# Patient Record
Sex: Female | Born: 1945 | Race: White | Hispanic: No | Marital: Married | Smoking: Current every day smoker
Health system: Southern US, Community
[De-identification: ages and names within clinical notes are randomized; demographics above are authoritative.]

## PROBLEM LIST (undated history)

## (undated) DIAGNOSIS — E7800 Pure hypercholesterolemia, unspecified: Secondary | ICD-10-CM

## (undated) DIAGNOSIS — I2510 Atherosclerotic heart disease of native coronary artery without angina pectoris: Secondary | ICD-10-CM

## (undated) DIAGNOSIS — N200 Calculus of kidney: Secondary | ICD-10-CM

## (undated) DIAGNOSIS — I10 Essential (primary) hypertension: Secondary | ICD-10-CM

## (undated) DIAGNOSIS — E039 Hypothyroidism, unspecified: Secondary | ICD-10-CM

## (undated) DIAGNOSIS — K219 Gastro-esophageal reflux disease without esophagitis: Secondary | ICD-10-CM

## (undated) DIAGNOSIS — K5790 Diverticulosis of intestine, part unspecified, without perforation or abscess without bleeding: Secondary | ICD-10-CM

## (undated) DIAGNOSIS — E119 Type 2 diabetes mellitus without complications: Secondary | ICD-10-CM

## (undated) DIAGNOSIS — K469 Unspecified abdominal hernia without obstruction or gangrene: Secondary | ICD-10-CM

## (undated) DIAGNOSIS — R319 Hematuria, unspecified: Secondary | ICD-10-CM

## (undated) HISTORY — DX: Calculus of kidney: N20.0

## (undated) HISTORY — DX: Hypothyroidism, unspecified: E03.9

## (undated) HISTORY — PX: COLONOSCOPY: SHX5424

## (undated) HISTORY — DX: Essential (primary) hypertension: I10

## (undated) HISTORY — PX: APPENDECTOMY: SHX54

## (undated) HISTORY — DX: Diverticulosis of intestine, part unspecified, without perforation or abscess without bleeding: K57.90

## (undated) HISTORY — DX: Hematuria, unspecified: R31.9

## (undated) HISTORY — DX: Gastro-esophageal reflux disease without esophagitis: K21.9

## (undated) HISTORY — DX: Atherosclerotic heart disease of native coronary artery without angina pectoris: I25.10

## (undated) HISTORY — DX: Type 2 diabetes mellitus without complications: E11.9

## (undated) HISTORY — PX: UPPER GI ENDOSCOPY: SHX6162

## (undated) HISTORY — DX: Pure hypercholesterolemia, unspecified: E78.00

---

## 1967-02-08 HISTORY — PX: BREAST BIOPSY: SHX20

## 2003-02-08 HISTORY — PX: CHOLECYSTECTOMY: SHX55

## 2005-08-09 ENCOUNTER — Ambulatory Visit: Payer: Self-pay | Admitting: Gastroenterology

## 2006-01-13 ENCOUNTER — Ambulatory Visit: Payer: Self-pay | Admitting: Internal Medicine

## 2007-03-01 ENCOUNTER — Ambulatory Visit: Payer: Self-pay | Admitting: Internal Medicine

## 2008-03-04 ENCOUNTER — Ambulatory Visit: Payer: Self-pay

## 2008-03-21 ENCOUNTER — Ambulatory Visit: Payer: Self-pay | Admitting: Internal Medicine

## 2008-09-19 ENCOUNTER — Ambulatory Visit: Payer: Self-pay | Admitting: Internal Medicine

## 2008-12-12 ENCOUNTER — Ambulatory Visit: Payer: Self-pay | Admitting: Gastroenterology

## 2008-12-15 ENCOUNTER — Ambulatory Visit: Payer: Self-pay | Admitting: Gastroenterology

## 2009-01-07 ENCOUNTER — Ambulatory Visit: Payer: Self-pay | Admitting: Ophthalmology

## 2009-02-04 ENCOUNTER — Ambulatory Visit: Payer: Self-pay | Admitting: Ophthalmology

## 2009-03-05 ENCOUNTER — Ambulatory Visit: Payer: Self-pay

## 2010-03-17 ENCOUNTER — Ambulatory Visit: Payer: Self-pay | Admitting: Family Medicine

## 2010-12-31 ENCOUNTER — Ambulatory Visit: Payer: Self-pay | Admitting: Internal Medicine

## 2011-03-23 ENCOUNTER — Ambulatory Visit: Payer: Self-pay | Admitting: Internal Medicine

## 2011-04-25 LAB — HEPATIC FUNCTION PANEL
ALT: 14 U/L (ref 7–35)
Alkaline Phosphatase: 91 U/L (ref 25–125)
Bilirubin, Total: 6.9 mg/dL

## 2011-04-25 LAB — BASIC METABOLIC PANEL: Potassium: 4.7 mmol/L (ref 3.4–5.3)

## 2011-04-25 LAB — LIPID PANEL: Cholesterol: 266 mg/dL — AB (ref 0–200)

## 2012-03-08 ENCOUNTER — Encounter: Payer: Self-pay | Admitting: *Deleted

## 2012-03-09 ENCOUNTER — Encounter: Payer: Self-pay | Admitting: Internal Medicine

## 2012-03-09 ENCOUNTER — Ambulatory Visit (INDEPENDENT_AMBULATORY_CARE_PROVIDER_SITE_OTHER): Payer: PRIVATE HEALTH INSURANCE | Admitting: Internal Medicine

## 2012-03-09 ENCOUNTER — Other Ambulatory Visit (HOSPITAL_COMMUNITY)
Admission: RE | Admit: 2012-03-09 | Discharge: 2012-03-09 | Disposition: A | Discharge status: Home / Self Care | Payer: PRIVATE HEALTH INSURANCE | Source: Ambulatory Visit | Attending: Internal Medicine | Admitting: Internal Medicine

## 2012-03-09 VITALS — BP 150/88 | HR 67 | Temp 98.4°F | Ht 63.0 in | Wt 158.5 lb

## 2012-03-09 DIAGNOSIS — Z01419 Encounter for gynecological examination (general) (routine) without abnormal findings: Secondary | ICD-10-CM | POA: Insufficient documentation

## 2012-03-09 DIAGNOSIS — K219 Gastro-esophageal reflux disease without esophagitis: Secondary | ICD-10-CM

## 2012-03-09 DIAGNOSIS — R42 Dizziness and giddiness: Secondary | ICD-10-CM

## 2012-03-09 DIAGNOSIS — I10 Essential (primary) hypertension: Secondary | ICD-10-CM

## 2012-03-09 DIAGNOSIS — Z139 Encounter for screening, unspecified: Secondary | ICD-10-CM

## 2012-03-09 DIAGNOSIS — E039 Hypothyroidism, unspecified: Secondary | ICD-10-CM

## 2012-03-09 DIAGNOSIS — Z1151 Encounter for screening for human papillomavirus (HPV): Secondary | ICD-10-CM | POA: Insufficient documentation

## 2012-03-09 DIAGNOSIS — E7800 Pure hypercholesterolemia, unspecified: Secondary | ICD-10-CM

## 2012-03-09 DIAGNOSIS — E119 Type 2 diabetes mellitus without complications: Secondary | ICD-10-CM

## 2012-03-10 ENCOUNTER — Encounter: Payer: Self-pay | Admitting: Internal Medicine

## 2012-03-10 DIAGNOSIS — E7800 Pure hypercholesterolemia, unspecified: Secondary | ICD-10-CM | POA: Insufficient documentation

## 2012-03-10 DIAGNOSIS — I10 Essential (primary) hypertension: Secondary | ICD-10-CM | POA: Insufficient documentation

## 2012-03-10 DIAGNOSIS — K219 Gastro-esophageal reflux disease without esophagitis: Secondary | ICD-10-CM | POA: Insufficient documentation

## 2012-03-10 DIAGNOSIS — E039 Hypothyroidism, unspecified: Secondary | ICD-10-CM | POA: Insufficient documentation

## 2012-03-10 DIAGNOSIS — E119 Type 2 diabetes mellitus without complications: Secondary | ICD-10-CM | POA: Insufficient documentation

## 2012-03-10 MED ORDER — LEVOTHYROXINE SODIUM 100 MCG PO TABS: 100.0000 ug | ORAL_TABLET | Freq: Every day | ORAL | Status: DC

## 2012-03-10 MED ORDER — PANTOPRAZOLE SODIUM 40 MG PO TBEC: 40.0000 mg | DELAYED_RELEASE_TABLET | Freq: Every day | ORAL | Status: DC

## 2012-03-10 MED ORDER — LISINOPRIL 10 MG PO TABS: 10.0000 mg | ORAL_TABLET | Freq: Every day | ORAL | Status: DC

## 2012-03-10 MED ORDER — SITAGLIPTIN PHOS-METFORMIN HCL 50-500 MG PO TABS: 1.0000 | ORAL_TABLET | Freq: Every day | ORAL | Status: DC

## 2012-03-10 NOTE — Assessment & Plan Note (Signed)
On thyroid replacement.  Check tsh.  

## 2012-03-10 NOTE — Progress Notes (Signed)
Subjective:    Patient ID: Ann Collins, female    DOB: 1945-10-05, 67 y.o.   MRN: [id number]  HPI 67 year old female with past history of diabetes, hypertension, hypercholesterolemia and hypothyroidism who comes in today to follow up on these issues as well as for a complete physical exam.  She states that approximately 10 days ago, she was standing and cooking - noticed being light headed.  Has had some persistent dizziness/light headedness.  States if she is looking down and then looks up - will notice room moving or objects moving.  Some nausea associated.  Has been intermittent for the last two weeks.  She has tried doing head maneuvers - which have helped.  Has noticed some sinus pressure.  No headache or vision changes.   No chest pain or tightness.  Breathing stable.  Bowels stable.     Past Medical History  Diagnosis Date  . Hypothyroidism   . Hypercholesterolemia   . GERD (gastroesophageal reflux disease)   . Hypertension   . Diabetes mellitus   . Hematuria   . Diverticulosis   . Kidney stones     Current Outpatient Prescriptions on File Prior to Visit  Medication Sig Dispense Refill  . aspirin EC 81 MG tablet Take 81 mg by mouth daily.      . fluticasone (FLONASE) 50 MCG/ACT nasal spray Place 2 sprays into the nose daily.      Marland Kitchen levothyroxine (SYNTHROID, LEVOTHROID) 100 MCG tablet Take 1 tablet (100 mcg total) by mouth daily.  90 tablet  1  . lisinopril (PRINIVIL,ZESTRIL) 10 MG tablet Take 1 tablet (10 mg total) by mouth daily.  90 tablet  1  . pantoprazole (PROTONIX) 40 MG tablet Take 1 tablet (40 mg total) by mouth daily.  90 tablet  1  . sitaGLIPtan-metformin (JANUMET) 50-500 MG per tablet Take 1 tablet by mouth daily.  90 tablet  1    Review of Systems Patient denies any headache.  Light headedness as outlined. No vision changes.   Some sinus pressure.   No chest pain, tightness or palpitations.  No increased shortness of breath, cough or congestion.  No nausea or  vomiting.  No acid reflux.  No abdominal pain or cramping.  No bowel change, such as diarrhea, constipation, BRBPR or melana.  No urine change.  Not taking her meds regularly.  Skips doses.        Objective:   Physical Exam Filed Vitals:   03/09/12 1347  BP: 150/88  Pulse: 67  Temp: 98.4 F (36.9 C)   Blood pressure recheck:  66/33  67 year old female in no acute distress.   HEENT:  Nares- clear.  Oropharynx - without lesions. NECK:  Supple.  Nontender.  HEART:  Appears to be regular. LUNGS:  No crackles or wheezing audible.  Respirations even and unlabored.  RADIAL PULSE:  Equal bilaterally.    BREASTS:  No nipple discharge or nipple retraction present.  Could not appreciate any distinct nodules or axillary adenopathy.  ABDOMEN:  Soft, nontender.  Bowel sounds present and normal.  No audible abdominal bruit.  GU:  Normal external genitalia.  Vaginal vault without lesions.  Cervix identified.  Pap performed. Could not appreciate any adnexal masses or tenderness.   RECTAL:  Heme negative.   EXTREMITIES:  No increased edema present.  DP pulses palpable and equal bilaterally.   SKIN:  Without lesions.          Assessment & Plan:  LIGHT HEADEDNESS.  See above.  Reproducible - with certain movements.  Head maneuvers - have helped.  Persistent.  Discussed further w/up.  Declines scanning.  Will refer to ENT for evaluation and further testing.  Take aspirin daily.   MEDICATION NONCOMPLIANCE.  Discussed at length with her today.  Discussed the importance of taking her medication regularly.  Follow.   CARDIOVASCULAR.  Stress test negative for ischemia 11/10.  Currently asymptomatic.    LEFT OVARIAN CYST.  Pelvic ultrasound 12/31/10 revealed a 6mm left ovarian cyst.  Discussed with GYN.  Felt no further w/up warranted.    HEALTH MAINTENANCE.  Physical today.  Colonoscopy 12/22/08 - diverticulosis otherwise normal.  Mammogram 03/23/11 - BiRADS II.  Schedule follow up mammogram when due.   IFOB.

## 2012-03-10 NOTE — Assessment & Plan Note (Signed)
Blood pressure elevated today.  Not taking her meds regularly.  Restart - take regularly.  Continue lisinopril.  Follow blood pressure.  Check metabolic panel.

## 2012-03-10 NOTE — Assessment & Plan Note (Signed)
EGD 12/12/08 revealed erosive esophagitis, hiatal hernia, gastritis and duodenitis.  No problems with upper GI symptoms reported today.  Follow.     

## 2012-03-10 NOTE — Assessment & Plan Note (Signed)
Brought in no sugar readings.  A1c 10/13 - 7.7.  Not taking her medication regularly.  Discussed the importance of taking her meds regularly.  Diabetic diet and exercise.  Follow.

## 2012-03-19 ENCOUNTER — Other Ambulatory Visit (INDEPENDENT_AMBULATORY_CARE_PROVIDER_SITE_OTHER): Payer: PRIVATE HEALTH INSURANCE

## 2012-03-19 DIAGNOSIS — Z139 Encounter for screening, unspecified: Secondary | ICD-10-CM

## 2012-03-20 ENCOUNTER — Encounter: Payer: Self-pay | Admitting: *Deleted

## 2012-03-20 LAB — FECAL OCCULT BLOOD, IMMUNOCHEMICAL: Fecal Occult Bld: NEGATIVE

## 2012-03-26 ENCOUNTER — Telehealth: Payer: Self-pay | Admitting: Internal Medicine

## 2012-03-26 MED ORDER — LEVOTHYROXINE SODIUM 100 MCG PO TABS: 100.0000 ug | ORAL_TABLET | Freq: Every day | ORAL | Status: DC

## 2012-03-26 NOTE — Telephone Encounter (Signed)
Refill on Levothyroxine 90 day supplu to [facility]

## 2012-03-26 NOTE — Telephone Encounter (Signed)
Prescription sent to pharmacy.

## 2012-03-27 ENCOUNTER — Ambulatory Visit: Payer: Self-pay | Admitting: Internal Medicine

## 2012-04-02 ENCOUNTER — Encounter: Payer: Self-pay | Admitting: Internal Medicine

## 2012-04-02 ENCOUNTER — Ambulatory Visit (INDEPENDENT_AMBULATORY_CARE_PROVIDER_SITE_OTHER): Payer: PRIVATE HEALTH INSURANCE | Admitting: Internal Medicine

## 2012-04-02 VITALS — BP 120/70 | HR 66 | Temp 98.8°F | Ht 63.0 in | Wt 159.5 lb

## 2012-04-02 DIAGNOSIS — E039 Hypothyroidism, unspecified: Secondary | ICD-10-CM

## 2012-04-02 DIAGNOSIS — E7800 Pure hypercholesterolemia, unspecified: Secondary | ICD-10-CM

## 2012-04-02 DIAGNOSIS — E119 Type 2 diabetes mellitus without complications: Secondary | ICD-10-CM

## 2012-04-02 DIAGNOSIS — K219 Gastro-esophageal reflux disease without esophagitis: Secondary | ICD-10-CM

## 2012-04-02 DIAGNOSIS — I10 Essential (primary) hypertension: Secondary | ICD-10-CM

## 2012-04-02 MED ORDER — FLUTICASONE PROPIONATE 50 MCG/ACT NA SUSP: 2.0000 | Freq: Every day | NASAL | Status: DC

## 2012-04-02 MED ORDER — LEVOTHYROXINE SODIUM 100 MCG PO TABS: 100.0000 ug | ORAL_TABLET | Freq: Every day | ORAL | Status: DC

## 2012-04-02 MED ORDER — LISINOPRIL 10 MG PO TABS: 10.0000 mg | ORAL_TABLET | Freq: Every day | ORAL | Status: DC

## 2012-04-11 ENCOUNTER — Encounter: Payer: Self-pay | Admitting: Internal Medicine

## 2012-04-15 ENCOUNTER — Encounter: Payer: Self-pay | Admitting: Internal Medicine

## 2012-04-15 ENCOUNTER — Telehealth: Payer: Self-pay | Admitting: Internal Medicine

## 2012-04-15 NOTE — Assessment & Plan Note (Signed)
On thyroid replacement.  Follow tsh.  

## 2012-04-15 NOTE — Assessment & Plan Note (Signed)
Blood pressure improved.  Taking her medication regularly now.  Continue lisinopril.  Follow blood pressure.  Check metabolic panel.

## 2012-04-15 NOTE — Assessment & Plan Note (Signed)
EGD 12/12/08 revealed erosive esophagitis, hiatal hernia, gastritis and duodenitis.  No problems with upper GI symptoms reported today.  Follow.

## 2012-04-15 NOTE — Progress Notes (Signed)
  Subjective:    Patient ID: Ann Collins, female    DOB: April 22, 1945, 67 y.o.   MRN: [id number]  HPI 67 year old female with past history of diabetes, hypertension, hypercholesterolemia and hypothyroidism who comes in today for a scheduled follow up.  No headache or vision changes.   No chest pain or tightness.  Breathing stable.  Bowels stable.  She states she is doing better watching what she eats.  No nausea or vomiting.  Brought in no sugar readings.  States they have been doing better.  Taking all of her medicine now.  Some sinus drainage, but no significant infection.     Past Medical History  Diagnosis Date  . Hypothyroidism   . Hypercholesterolemia   . GERD (gastroesophageal reflux disease)   . Hypertension   . Diabetes mellitus   . Hematuria   . Diverticulosis   . Kidney stones     Current Outpatient Prescriptions on File Prior to Visit  Medication Sig Dispense Refill  . aspirin EC 81 MG tablet Take 81 mg by mouth daily.      . pantoprazole (PROTONIX) 40 MG tablet Take 1 tablet (40 mg total) by mouth daily.  90 tablet  1  . sitaGLIPtan-metformin (JANUMET) 50-500 MG per tablet Take 1 tablet by mouth daily.  90 tablet  1   No current facility-administered medications on file prior to visit.    Review of Systems Patient denies any headache.  Previous light headedness.  Resolved now.  Saw Dr Jenne Campus.  Diagnosed with BPV.  No vision changes.   Some drainage.   No chest pain, tightness or palpitations.  No increased shortness of breath, cough or congestion.  No nausea or vomiting.  No acid reflux.  No abdominal pain or cramping.  No bowel change, such as diarrhea, constipation, BRBPR or melana.  No urine change.  States she is taking her meds regularly.       Objective:   Physical Exam  Filed Vitals:   04/02/12 1613  BP: 120/70  Pulse: 66  Temp: 98.8 F (87.44 C)   67 year old female in no acute distress.   HEENT:  Nares- clear.  Oropharynx - without lesions. NECK:   Supple.  Nontender.  Left carotid bruit.  HEART:  Appears to be regular. LUNGS:  No crackles or wheezing audible.  Respirations even and unlabored.  RADIAL PULSE:  Equal bilaterally.   ABDOMEN:  Soft, nontender.  Bowel sounds present and normal.  No audible abdominal bruit.  EXTREMITIES:  No increased edema present.  DP pulses palpable and equal bilaterally.   SKIN:  Without lesions.          Assessment & Plan:  LIGHT HEADEDNESS.  Resolved now.  Saw Dr Jenne Campus.  Diagnosed with BPV.  Follow.    MEDICATION NONCOMPLIANCE.  Discussed at length with her last visit.  Have discussed the importance of taking her medication regularly.  States she is taking her meds regularly.  Follow.    CARDIOVASCULAR.  Stress test negative for ischemia 11/10.  Currently asymptomatic.    LEFT OVARIAN CYST.  Pelvic ultrasound 12/31/10 revealed a 6mm left ovarian cyst.  Discussed with GYN.  Felt no further w/up warranted.    HEALTH MAINTENANCE.  Physical last visit. Colonoscopy 12/22/08 - diverticulosis otherwise normal. Mammogram 03/23/11 - BiRADS II.  Should be scheduled for a follow up mammogram.

## 2012-04-15 NOTE — Telephone Encounter (Signed)
Needs a follow up appt in 6-8 weeks.  Please schedule.

## 2012-04-15 NOTE — Assessment & Plan Note (Signed)
Brought in no sugar readings.  A1c 10/13 - 7.7.  Had not been taking her medication regularly.  States she is taking regularly now.  Diabetic diet and exercise.  Follow.

## 2012-04-15 NOTE — Assessment & Plan Note (Signed)
Lipid profile 03/23/12 revealed total cholesterol 239, triglycerides 259, HDL 42 and LDL 145.  Improved.  Declines cholesterol medication.  Follow.  Low cholesterol diet.

## 2012-04-17 NOTE — Telephone Encounter (Signed)
Tried call pt no answer 

## 2012-04-18 NOTE — Telephone Encounter (Signed)
Left message on pt cell asking pt to call office please schedule appointment

## 2012-04-18 NOTE — Telephone Encounter (Signed)
Patient scheduled on 4.24.14 @ 4:00.

## 2012-05-15 ENCOUNTER — Encounter: Payer: Self-pay | Admitting: Internal Medicine

## 2012-05-31 ENCOUNTER — Ambulatory Visit (INDEPENDENT_AMBULATORY_CARE_PROVIDER_SITE_OTHER): Payer: PRIVATE HEALTH INSURANCE | Admitting: Internal Medicine

## 2012-05-31 ENCOUNTER — Encounter: Payer: Self-pay | Admitting: Internal Medicine

## 2012-05-31 VITALS — BP 130/80 | HR 68 | Temp 98.6°F | Ht 63.0 in | Wt 160.0 lb

## 2012-05-31 DIAGNOSIS — K219 Gastro-esophageal reflux disease without esophagitis: Secondary | ICD-10-CM

## 2012-05-31 DIAGNOSIS — M25511 Pain in right shoulder: Secondary | ICD-10-CM

## 2012-05-31 DIAGNOSIS — M25519 Pain in unspecified shoulder: Secondary | ICD-10-CM

## 2012-05-31 DIAGNOSIS — E039 Hypothyroidism, unspecified: Secondary | ICD-10-CM

## 2012-05-31 DIAGNOSIS — E7800 Pure hypercholesterolemia, unspecified: Secondary | ICD-10-CM

## 2012-05-31 DIAGNOSIS — I10 Essential (primary) hypertension: Secondary | ICD-10-CM

## 2012-05-31 DIAGNOSIS — E119 Type 2 diabetes mellitus without complications: Secondary | ICD-10-CM

## 2012-06-03 ENCOUNTER — Telehealth: Payer: Self-pay | Admitting: Internal Medicine

## 2012-06-03 ENCOUNTER — Encounter: Payer: Self-pay | Admitting: Internal Medicine

## 2012-06-03 NOTE — Assessment & Plan Note (Signed)
EGD 12/12/08 revealed erosive esophagitis, hiatal hernia, gastritis and duodenitis.  No problems with upper GI symptoms reported today.  Follow.

## 2012-06-03 NOTE — Assessment & Plan Note (Signed)
Lipid profile 03/23/12 revealed total cholesterol 239, triglycerides 259, HDL 42 and LDL 145.  Improved.  Declines cholesterol medication.  Follow.  Low cholesterol diet.

## 2012-06-03 NOTE — Assessment & Plan Note (Signed)
Blood pressure improved.  Taking her medication regularly now.  Continue lisinopril.  Follow blood pressure.  Check metabolic panel.

## 2012-06-03 NOTE — Progress Notes (Signed)
Subjective:    Patient ID: Ann Collins, female    DOB: [birth year], 67 y.o.   MRN: [id number]  HPI 67 year old female with past history of diabetes, hypertension, hypercholesterolemia and hypothyroidism who comes in today for a scheduled follow up.  No headache or vision changes.   No chest pain or tightness.  Breathing stable.  Bowels stable.  No nausea or vomiting.  Brought in no sugar readings.  Does state she is taking all of her medicine now.  She did fall on the ice.  Hurt her right shoulder.  Still with increased pain and limited rom.   Has been approximately 6 weeks.  States it is some better.     Past Medical History  Diagnosis Date  . Hypothyroidism   . Hypercholesterolemia   . GERD (gastroesophageal reflux disease)   . Hypertension   . Diabetes mellitus   . Hematuria   . Diverticulosis   . Kidney stones     Current Outpatient Prescriptions on File Prior to Visit  Medication Sig Dispense Refill  . aspirin EC 81 MG tablet Take 81 mg by mouth daily.      . fluticasone (FLONASE) 50 MCG/ACT nasal spray Place 2 sprays into the nose daily.  16 g  4  . levothyroxine (SYNTHROID, LEVOTHROID) 100 MCG tablet Take 1 tablet (100 mcg total) by mouth daily.  90 tablet  3  . lisinopril (PRINIVIL,ZESTRIL) 10 MG tablet Take 1 tablet (10 mg total) by mouth daily.  90 tablet  3  . pantoprazole (PROTONIX) 40 MG tablet Take 1 tablet (40 mg total) by mouth daily.  90 tablet  1  . sitaGLIPtan-metformin (JANUMET) 50-500 MG per tablet Take 1 tablet by mouth daily.  90 tablet  1   No current facility-administered medications on file prior to visit.    Review of Systems Patient denies any headache.  Previous light headedness.  Resolved now.  Saw Dr Jenne Campus.  Diagnosed with BPV.  No vision changes.   No chest pain, tightness or palpitations.  No increased shortness of breath, cough or congestion.  No nausea or vomiting.  No acid reflux.  No abdominal pain or cramping.  No bowel change, such as  diarrhea, constipation, BRBPR or melana.  No urine change. States she is taking her meds regularly.   Not checking her sugars.  Brought in no recorded sugar readings.  Fell on her right arm.   Limited rom and increased pain.  Has been 6 weeks.       Objective:   Physical Exam  Filed Vitals:   05/31/12 1609  BP: 130/80  Pulse: 68  Temp: 98.6 F (69 C)   67 year old female in no acute distress.   HEENT:  Nares- clear.  Oropharynx - without lesions. NECK:  Supple.  Nontender.  Left carotid bruit.  HEART:  Appears to be regular. LUNGS:  No crackles or wheezing audible.  Respirations even and unlabored.  RADIAL PULSE:  Equal bilaterally.   ABDOMEN:  Soft, nontender.  Bowel sounds present and normal.  No audible abdominal bruit.  EXTREMITIES:  No increased edema present.  DP pulses palpable and equal bilaterally.   SKIN:  Without lesions.   MSK:  Increased pain - right shoulder - with abduction and adduction of her arm.  Some pain with rotation of her arm.  Limited rom - especially at or above 90 degrees. Some decreased pain with passive rom.          Assessment &  Plan:  LIGHT HEADEDNESS.  Resolved now.  Saw Dr Jenne Campus.  Diagnosed with BPV.  Follow.    MEDICATION NONCOMPLIANCE.   Have discussed the importance of taking her medication regularly.  States she is taking her meds regularly.  Follow.    CARDIOVASCULAR.  Stress test negative for ischemia 11/10.  Currently asymptomatic.    LEFT OVARIAN CYST.  Pelvic ultrasound 12/31/10 revealed a 6mm left ovarian cyst.  Discussed with GYN.  Felt no further w/up warranted.    HEALTH MAINTENANCE.  Physical 03/09/12.  Colonoscopy 12/22/08 - diverticulosis otherwise normal. Mammogram 03/27/12 - BiRADS II.

## 2012-06-03 NOTE — Assessment & Plan Note (Signed)
Sugars elevated.  States she is now taking her medication regularly.  Not checking sugars regularly.  Brought in no recorded sugar readings.  Follow. Need to review readings to be able to adjust medications.  Discussed importance of diet, exercise and recording sugars.  Get her back in soon to reassess.

## 2012-06-03 NOTE — Telephone Encounter (Signed)
Pt needs a follow up appt with me in one month and labs 1-2 days before follow up appt.  Fasting labs.  Thanks.

## 2012-06-03 NOTE — Assessment & Plan Note (Signed)
On thyroid replacement.  Follow tsh.  

## 2012-06-04 NOTE — Telephone Encounter (Signed)
Patient has scheduled her follow up appointment ,but she is needing labs sent to [facility] she is wanting them done on May 23,2014

## 2012-06-04 NOTE — Telephone Encounter (Signed)
Left message on home phone asking pt to call office

## 2012-06-04 NOTE — Telephone Encounter (Signed)
Order form completed and placed in your box

## 2012-06-05 NOTE — Telephone Encounter (Signed)
Mailed [facility] order to patient

## 2012-06-29 ENCOUNTER — Other Ambulatory Visit: Payer: Self-pay | Admitting: Internal Medicine

## 2012-07-05 ENCOUNTER — Ambulatory Visit (INDEPENDENT_AMBULATORY_CARE_PROVIDER_SITE_OTHER): Payer: PRIVATE HEALTH INSURANCE | Admitting: Internal Medicine

## 2012-07-05 ENCOUNTER — Encounter: Payer: Self-pay | Admitting: Internal Medicine

## 2012-07-05 VITALS — BP 130/60 | HR 75 | Temp 98.4°F | Ht 63.0 in | Wt 159.0 lb

## 2012-07-05 DIAGNOSIS — E7800 Pure hypercholesterolemia, unspecified: Secondary | ICD-10-CM

## 2012-07-05 DIAGNOSIS — I10 Essential (primary) hypertension: Secondary | ICD-10-CM

## 2012-07-05 DIAGNOSIS — K219 Gastro-esophageal reflux disease without esophagitis: Secondary | ICD-10-CM

## 2012-07-05 DIAGNOSIS — E119 Type 2 diabetes mellitus without complications: Secondary | ICD-10-CM

## 2012-07-05 DIAGNOSIS — E039 Hypothyroidism, unspecified: Secondary | ICD-10-CM

## 2012-07-05 LAB — HM DIABETES FOOT EXAM

## 2012-07-05 MED ORDER — SITAGLIP PHOS-METFORMIN HCL ER 50-500 MG PO TB24: 50.0000 mg | ORAL_TABLET | Freq: Every day | ORAL | Status: DC

## 2012-07-06 LAB — LIPID PANEL W/O CHOL/HDL RATIO
HDL: 59 mg/dL (ref >39)
LDL Calculated: 171 mg/dL — ABNORMAL HIGH (ref 0–99)
VLDL Cholesterol Cal: 27 mg/dL (ref 5–40)

## 2012-07-06 LAB — BASIC METABOLIC PANEL
BUN/Creatinine Ratio: 16 (ref 11–26)
CO2: 23 mmol/L (ref 19–28)
Creatinine, Ser: 1.02 mg/dL — ABNORMAL HIGH (ref 0.57–1.00)
Sodium: 143 mmol/L (ref 134–144)

## 2012-07-06 LAB — HEPATIC FUNCTION PANEL
Albumin: 4.2 g/dL (ref 3.6–4.8)
Alkaline Phosphatase: 94 IU/L (ref 39–117)
Total Protein: 6.3 g/dL (ref 6.0–8.5)

## 2012-07-08 ENCOUNTER — Encounter: Payer: Self-pay | Admitting: Internal Medicine

## 2012-07-08 NOTE — Assessment & Plan Note (Signed)
Blood pressure doing well.  Taking her medication regularly now.  Continue lisinopril.  Follow blood pressure.  Check metabolic panel.

## 2012-07-08 NOTE — Progress Notes (Signed)
Subjective:    Patient ID: Ann Collins, female    DOB: [birth year], 67 y.o.   MRN: [id number]  HPI 67 year old female with past history of diabetes, hypertension, hypercholesterolemia and hypothyroidism who comes in today for a scheduled follow up.  No headache or vision changes.   No chest pain or tightness.  Breathing stable.  Bowels stable.  No nausea or vomiting.  Blood sugars before breakfast averaging 140-170.  After dinner blood sugars - 160-180.  Taking Janumet at lunch. Not taking the extended release form.  Trying to watch what she eats.  On protonix.  Bowels stable.       Past Medical History  Diagnosis Date  . Hypothyroidism   . Hypercholesterolemia   . GERD (gastroesophageal reflux disease)   . Hypertension   . Diabetes mellitus   . Hematuria   . Diverticulosis   . Kidney stones     Current Outpatient Prescriptions on File Prior to Visit  Medication Sig Dispense Refill  . aspirin EC 81 MG tablet Take 81 mg by mouth daily.      . fluticasone (FLONASE) 50 MCG/ACT nasal spray Place 2 sprays into the nose daily.  16 g  4  . ibuprofen (ADVIL,MOTRIN) 200 MG tablet Take 800 mg by mouth every 6 (six) hours as needed for pain.      Marland Kitchen levothyroxine (SYNTHROID, LEVOTHROID) 100 MCG tablet Take 1 tablet (100 mcg total) by mouth daily.  90 tablet  3  . lisinopril (PRINIVIL,ZESTRIL) 10 MG tablet Take 1 tablet (10 mg total) by mouth daily.  90 tablet  3  . pantoprazole (PROTONIX) 40 MG tablet Take 1 tablet (40 mg total) by mouth daily.  90 tablet  1   No current facility-administered medications on file prior to visit.    Review of Systems Patient denies any headache.  Previous light headedness.  Resolved now.  Saw Dr Jenne Campus.  Diagnosed with BPV.  No vision changes.   No chest pain, tightness or palpitations.  No increased shortness of breath, cough or congestion.  No nausea or vomiting.  No acid reflux.  No abdominal pain or cramping.  No bowel change, such as diarrhea,  constipation, BRBPR or melana.  No urine change. States she is taking her meds regularly.   Persistent back lesion.        Objective:   Physical Exam  Filed Vitals:   07/05/12 1605  BP: 130/60  Pulse: 75  Temp: 98.4 F (7.67 C)   67 year old female in no acute distress.   HEENT:  Nares- clear.  Oropharynx - without lesions. NECK:  Supple.  Nontender.  Left carotid bruit.  HEART:  Appears to be regular. LUNGS:  No crackles or wheezing audible.  Respirations even and unlabored.  RADIAL PULSE:  Equal bilaterally.   ABDOMEN:  Soft, nontender.  Bowel sounds present and normal.  No audible abdominal bruit.  EXTREMITIES:  No increased edema present.  DP pulses palpable and equal bilaterally.   SKIN: persistent back lesion.         Assessment & Plan:  LIGHT HEADEDNESS.  Resolved now.  Saw Dr Jenne Campus.  Diagnosed with BPV.  Follow.    MEDICATION NONCOMPLIANCE.   Have discussed the importance of taking her medication regularly.  States she is taking her meds regularly.  Follow.    CARDIOVASCULAR.  Stress test negative for ischemia 11/10.  Currently asymptomatic.    LEFT OVARIAN CYST.  Pelvic ultrasound 12/31/10 revealed a 6mm left  ovarian cyst.  Discussed with GYN.  Felt no further w/up warranted.    HEALTH MAINTENANCE.  Physical 03/09/12.  Colonoscopy 12/22/08 - diverticulosis otherwise normal. Mammogram 03/27/12 - BiRADS II.

## 2012-07-08 NOTE — Assessment & Plan Note (Signed)
EGD 12/12/08 revealed erosive esophagitis, hiatal hernia, gastritis and duodenitis.  On protonix.

## 2012-07-08 NOTE — Assessment & Plan Note (Signed)
Sugars as outlined.  States she is now taking her medication regularly.  Will change to Janumet XR 50/500.  Follow sugars.  Get her back in soon to reassess.

## 2012-07-08 NOTE — Assessment & Plan Note (Signed)
Lipid profile 03/23/12 revealed total cholesterol 239, triglycerides 259, HDL 42 and LDL 145.  Improved.  Declines cholesterol medication.  Follow.  Low cholesterol diet. Recheck lipid panel.

## 2012-07-08 NOTE — Assessment & Plan Note (Signed)
On thyroid replacement.  Follow tsh.  

## 2012-07-12 ENCOUNTER — Encounter: Payer: Self-pay | Admitting: *Deleted

## 2012-08-21 ENCOUNTER — Telehealth: Payer: Self-pay | Admitting: *Deleted

## 2012-08-21 NOTE — Telephone Encounter (Signed)
Pt left a voicemail stating that her A1C draw on 03/19/12 was filed incorrectly. Per her insurance EOB, it should be filed to cover her diabetes dx, not filed as routine. Pt would like this corrected and resent to insurance.

## 2012-08-21 NOTE — Telephone Encounter (Signed)
I have sent an email to charge correction for them to look at this and to file using 250.02 for dx code.

## 2012-08-21 NOTE — Telephone Encounter (Signed)
She does have diabetes and the a1c should have been drawn as 250.02.  Please help with this.  Thanks.

## 2012-08-21 NOTE — Telephone Encounter (Signed)
Pt.notified

## 2012-08-24 ENCOUNTER — Encounter: Payer: Self-pay | Admitting: Internal Medicine

## 2012-08-24 ENCOUNTER — Ambulatory Visit (INDEPENDENT_AMBULATORY_CARE_PROVIDER_SITE_OTHER): Payer: PRIVATE HEALTH INSURANCE | Admitting: Internal Medicine

## 2012-08-24 VITALS — BP 120/60 | HR 80 | Temp 98.8°F | Ht 63.0 in | Wt 162.8 lb

## 2012-08-24 DIAGNOSIS — E7800 Pure hypercholesterolemia, unspecified: Secondary | ICD-10-CM

## 2012-08-24 DIAGNOSIS — R319 Hematuria, unspecified: Secondary | ICD-10-CM

## 2012-08-24 DIAGNOSIS — E119 Type 2 diabetes mellitus without complications: Secondary | ICD-10-CM

## 2012-08-24 DIAGNOSIS — I10 Essential (primary) hypertension: Secondary | ICD-10-CM

## 2012-08-24 DIAGNOSIS — K219 Gastro-esophageal reflux disease without esophagitis: Secondary | ICD-10-CM

## 2012-08-24 DIAGNOSIS — E039 Hypothyroidism, unspecified: Secondary | ICD-10-CM

## 2012-08-25 LAB — URINALYSIS, ROUTINE W REFLEX MICROSCOPIC
Glucose, UA: 100 mg/dL — AB
Specific Gravity, Urine: 1.017 (ref 1.005–1.030)
pH: 6 (ref 5.0–8.0)

## 2012-08-25 LAB — URINALYSIS, MICROSCOPIC ONLY

## 2012-08-26 ENCOUNTER — Encounter: Payer: Self-pay | Admitting: Internal Medicine

## 2012-08-26 NOTE — Assessment & Plan Note (Signed)
Declines cholesterol medication.  Low cholesterol diet. Follow lipid panel.   

## 2012-08-26 NOTE — Assessment & Plan Note (Signed)
Sugars as outlined.  States she is now taking her medication regularly.  Now Janumet XR 50/500.  Follow sugars.  She has not been checking her sugars.  Stressed the importance of checking regularly.

## 2012-08-26 NOTE — Progress Notes (Signed)
Subjective:    Patient ID: Ann Collins, female    DOB: [birth year], 67 y.o.   MRN: [id number]  HPI 67 year old female with past history of diabetes, hypertension, hypercholesterolemia and hypothyroidism who comes in today for a scheduled follow up.  No headache or vision changes.   No chest pain or tightness.  Breathing stable.  Bowels stable.  No nausea or vomiting.  Not checking blood sugars.   Taking Janumet at lunch.  Trying to watch what she eats.  On protonix.  Bowels stable.       Past Medical History  Diagnosis Date  . Hypothyroidism   . Hypercholesterolemia   . GERD (gastroesophageal reflux disease)   . Hypertension   . Diabetes mellitus   . Hematuria   . Diverticulosis   . Kidney stones     Current Outpatient Prescriptions on File Prior to Visit  Medication Sig Dispense Refill  . aspirin EC 81 MG tablet Take 81 mg by mouth daily.      . fluticasone (FLONASE) 50 MCG/ACT nasal spray Place 2 sprays into the nose daily.  16 g  4  . ibuprofen (ADVIL,MOTRIN) 200 MG tablet Take 800 mg by mouth every 6 (six) hours as needed for pain.      Marland Kitchen levothyroxine (SYNTHROID, LEVOTHROID) 100 MCG tablet Take 1 tablet (100 mcg total) by mouth daily.  90 tablet  3  . lisinopril (PRINIVIL,ZESTRIL) 10 MG tablet Take 1 tablet (10 mg total) by mouth daily.  90 tablet  3  . pantoprazole (PROTONIX) 40 MG tablet Take 1 tablet (40 mg total) by mouth daily.  90 tablet  1  . SitaGLIPtin-MetFORMIN HCl (JANUMET XR) 50-500 MG TB24 Take 50-500 mg by mouth daily.  30 tablet  3   No current facility-administered medications on file prior to visit.    Review of Systems Patient denies any headache.  Previous light headedness.  Resolved now.  Saw Dr Jenne Campus.  Diagnosed with BPV.  No vision changes.   No chest pain, tightness or palpitations.  No increased shortness of breath, cough or congestion.  No nausea or vomiting.  No acid reflux.  No abdominal pain or cramping.  No bowel change, such as diarrhea,  constipation, BRBPR or melana.  No urine change. States she is taking her meds regularly now.       Objective:   Physical Exam  Filed Vitals:   08/24/12 1609  BP: 120/60  Pulse: 80  Temp: 98.8 F (26.36 C)   67 year old female in no acute distress.   HEENT:  Nares- clear.  Oropharynx - without lesions. NECK:  Supple.  Nontender.  Left carotid bruit.  HEART:  Appears to be regular. LUNGS:  No crackles or wheezing audible.  Respirations even and unlabored.  RADIAL PULSE:  Equal bilaterally.   ABDOMEN:  Soft, nontender.  Bowel sounds present and normal.  No audible abdominal bruit.  EXTREMITIES:  No increased edema present.  DP pulses palpable and equal bilaterally.        Assessment & Plan:  LIGHT HEADEDNESS.  Resolved now.  Saw Dr Jenne Campus.  Diagnosed with BPV.  Follow.    MEDICATION NONCOMPLIANCE.   Have discussed the importance of taking her medication regularly.  States she is taking her meds regularly.  Follow.    CARDIOVASCULAR.  Stress test negative for ischemia 11/10.  Currently asymptomatic.    LEFT OVARIAN CYST.  Pelvic ultrasound 12/31/10 revealed a 6mm left ovarian cyst.  Discussed with GYN.  Felt no further w/up warranted.    HEALTH MAINTENANCE.  Physical 03/09/12.  Colonoscopy 12/22/08 - diverticulosis otherwise normal. Mammogram 03/27/12 - BiRADS II.

## 2012-08-26 NOTE — Assessment & Plan Note (Signed)
On thyroid replacement.  Follow tsh.  

## 2012-08-26 NOTE — Assessment & Plan Note (Signed)
EGD 12/12/08 revealed erosive esophagitis, hiatal hernia, gastritis and duodenitis.  On protonix.

## 2012-08-26 NOTE — Assessment & Plan Note (Signed)
Blood pressure doing well.  Taking her medication regularly now.  Continue lisinopril.  Follow blood pressure.  Follow metabolic panel.

## 2012-08-27 ENCOUNTER — Encounter: Payer: Self-pay | Admitting: *Deleted

## 2012-09-26 ENCOUNTER — Encounter: Payer: Self-pay | Admitting: *Deleted

## 2012-11-22 ENCOUNTER — Other Ambulatory Visit: Payer: Self-pay | Admitting: *Deleted

## 2012-11-22 MED ORDER — SITAGLIP PHOS-METFORMIN HCL ER 50-500 MG PO TB24: 50.0000 mg | ORAL_TABLET | Freq: Every day | ORAL | Status: DC

## 2013-01-02 ENCOUNTER — Other Ambulatory Visit: Payer: Self-pay | Admitting: *Deleted

## 2013-01-02 MED ORDER — PANTOPRAZOLE SODIUM 40 MG PO TBEC: 40.0000 mg | DELAYED_RELEASE_TABLET | Freq: Every day | ORAL | Status: DC

## 2013-04-09 ENCOUNTER — Encounter: Payer: Self-pay | Admitting: Internal Medicine

## 2013-04-09 ENCOUNTER — Ambulatory Visit: Payer: Self-pay | Admitting: Internal Medicine

## 2013-04-09 ENCOUNTER — Encounter (INDEPENDENT_AMBULATORY_CARE_PROVIDER_SITE_OTHER): Payer: Self-pay

## 2013-04-09 ENCOUNTER — Ambulatory Visit (INDEPENDENT_AMBULATORY_CARE_PROVIDER_SITE_OTHER): Payer: PRIVATE HEALTH INSURANCE | Admitting: Internal Medicine

## 2013-04-09 VITALS — BP 138/66 | HR 98 | Temp 98.1°F | Resp 16 | Wt 157.5 lb

## 2013-04-09 DIAGNOSIS — E039 Hypothyroidism, unspecified: Secondary | ICD-10-CM

## 2013-04-09 DIAGNOSIS — E119 Type 2 diabetes mellitus without complications: Secondary | ICD-10-CM

## 2013-04-09 DIAGNOSIS — I10 Essential (primary) hypertension: Secondary | ICD-10-CM

## 2013-04-09 DIAGNOSIS — K219 Gastro-esophageal reflux disease without esophagitis: Secondary | ICD-10-CM

## 2013-04-09 DIAGNOSIS — Z1239 Encounter for other screening for malignant neoplasm of breast: Secondary | ICD-10-CM

## 2013-04-09 DIAGNOSIS — E7800 Pure hypercholesterolemia, unspecified: Secondary | ICD-10-CM

## 2013-04-09 LAB — HM MAMMOGRAPHY: HM Mammogram: NEGATIVE

## 2013-04-09 NOTE — Progress Notes (Signed)
Pre visit review using our clinic review tool, if applicable. No additional management support is needed unless otherwise documented below in the visit note. 

## 2013-04-10 ENCOUNTER — Encounter: Payer: Self-pay | Admitting: Internal Medicine

## 2013-04-13 ENCOUNTER — Encounter: Payer: Self-pay | Admitting: Internal Medicine

## 2013-04-13 NOTE — Assessment & Plan Note (Signed)
Sugars as outlined.  Not checking her sugars regularly.  Discussed the need to check her sugars regularly and discussed importance of diet and exercise.  Check metabolic panel and Z8H.  Keep up to date with eye checks.

## 2013-04-13 NOTE — Assessment & Plan Note (Signed)
EGD 12/12/08 revealed erosive esophagitis, hiatal hernia, gastritis and duodenitis.  On protonix.  Controlled.    

## 2013-04-13 NOTE — Progress Notes (Signed)
  Subjective:    Patient ID: Ann Collins, female    DOB: [birth year], 68 y.o.   MRN: [id number]  HPI 68 year old female with past history of diabetes, hypertension, hypercholesterolemia and hypothyroidism who comes in today for a scheduled follow up.  No headache or vision changes.   No chest pain or tightness.  Breathing stable.  Bowels stable.  No nausea or vomiting.  Not checking blood sugars regularly.  AM sugars when checked - 150-170 and pm sugars 130-140.   Taking Janumet.  Not taking her lisinopril.   Trying to watch what she eats.  On protonix.  Bowels stable.       Past Medical History  Diagnosis Date  . Hypothyroidism   . Hypercholesterolemia   . GERD (gastroesophageal reflux disease)   . Hypertension   . Diabetes mellitus   . Hematuria   . Diverticulosis   . Kidney stones     Current Outpatient Prescriptions on File Prior to Visit  Medication Sig Dispense Refill  . levothyroxine (SYNTHROID, LEVOTHROID) 100 MCG tablet Take 1 tablet (100 mcg total) by mouth daily.  90 tablet  3  . lisinopril (PRINIVIL,ZESTRIL) 10 MG tablet Take 1 tablet (10 mg total) by mouth daily.  90 tablet  3  . pantoprazole (PROTONIX) 40 MG tablet Take 1 tablet (40 mg total) by mouth daily.  90 tablet  0  . SitaGLIPtin-MetFORMIN HCl (JANUMET XR) 50-500 MG TB24 Take 50-500 mg by mouth daily.  30 tablet  5   No current facility-administered medications on file prior to visit.    Review of Systems Patient denies any headache.  Previous light headedness.  Resolved now.  Saw Dr Tami Ribas.  Diagnosed with BPV.  No vision changes.   No chest pain, tightness or palpitations.  No increased shortness of breath, cough or congestion.  No nausea or vomiting.  No acid reflux.  No abdominal pain or cramping.  No bowel change, such as diarrhea, constipation, BRBPR or melana.  No urine change.      Objective:   Physical Exam  Filed Vitals:   04/09/13 1623  BP: 138/66  Pulse: 98  Temp: 98.1 F (36.7 C)  Resp: 50    68 year old female in no acute distress.   HEENT:  Nares- clear.  Oropharynx - without lesions. NECK:  Supple.  Nontender.  Left carotid bruit.  HEART:  Appears to be regular. LUNGS:  No crackles or wheezing audible.  Respirations even and unlabored.  RADIAL PULSE:  Equal bilaterally.   ABDOMEN:  Soft, nontender.  Bowel sounds present and normal.  No audible abdominal bruit.  EXTREMITIES:  No increased edema present.  DP pulses palpable and equal bilaterally.        Assessment & Plan:  LIGHT HEADEDNESS.  Resolved now.  Saw Dr Tami Ribas.  Diagnosed with BPV.  Follow.    MEDICATION NONCOMPLIANCE.   Have discussed the importance of taking her medication regularly.  Follow.     CARDIOVASCULAR.  Stress test negative for ischemia 11/10.  Currently asymptomatic.    LEFT OVARIAN CYST.  Pelvic ultrasound 12/31/10 revealed a 31mm left ovarian cyst.  Discussed with GYN.  Felt no further w/up warranted.    HEALTH MAINTENANCE.  Physical 03/09/12.  Colonoscopy 12/22/08 - diverticulosis otherwise normal. Mammogram 03/27/12 - BiRADS II.  Schedule her mammogram and next physical.

## 2013-04-13 NOTE — Assessment & Plan Note (Signed)
Declines cholesterol medication.  Low cholesterol diet. Follow lipid panel.

## 2013-04-13 NOTE — Assessment & Plan Note (Signed)
On thyroid replacement.  Follow tsh.  

## 2013-04-13 NOTE — Assessment & Plan Note (Signed)
Blood pressure doing well.  Continue lisinopril.  Needs to take regularly.   Follow blood pressure.  Follow metabolic panel.

## 2013-04-22 ENCOUNTER — Other Ambulatory Visit: Payer: Self-pay | Admitting: *Deleted

## 2013-04-22 MED ORDER — PANTOPRAZOLE SODIUM 40 MG PO TBEC: 40.0000 mg | DELAYED_RELEASE_TABLET | Freq: Every day | ORAL | Status: DC

## 2013-05-15 ENCOUNTER — Encounter: Payer: Self-pay | Admitting: *Deleted

## 2013-05-20 ENCOUNTER — Other Ambulatory Visit: Payer: Self-pay | Admitting: *Deleted

## 2013-05-20 MED ORDER — SITAGLIP PHOS-METFORMIN HCL ER 50-500 MG PO TB24: 50.0000 mg | ORAL_TABLET | Freq: Every day | ORAL | Status: DC

## 2013-05-27 ENCOUNTER — Other Ambulatory Visit: Payer: Self-pay | Admitting: Internal Medicine

## 2013-05-31 ENCOUNTER — Ambulatory Visit (INDEPENDENT_AMBULATORY_CARE_PROVIDER_SITE_OTHER): Payer: PRIVATE HEALTH INSURANCE | Admitting: Internal Medicine

## 2013-05-31 ENCOUNTER — Encounter: Payer: Self-pay | Admitting: Internal Medicine

## 2013-05-31 VITALS — BP 122/60 | HR 62 | Temp 98.2°F | Ht 63.0 in | Wt 155.2 lb

## 2013-05-31 DIAGNOSIS — I10 Essential (primary) hypertension: Secondary | ICD-10-CM

## 2013-05-31 DIAGNOSIS — K219 Gastro-esophageal reflux disease without esophagitis: Secondary | ICD-10-CM

## 2013-05-31 DIAGNOSIS — E039 Hypothyroidism, unspecified: Secondary | ICD-10-CM

## 2013-05-31 DIAGNOSIS — E7800 Pure hypercholesterolemia, unspecified: Secondary | ICD-10-CM

## 2013-05-31 DIAGNOSIS — E119 Type 2 diabetes mellitus without complications: Secondary | ICD-10-CM

## 2013-05-31 MED ORDER — PRAVASTATIN SODIUM 10 MG PO TABS: 10.0000 mg | ORAL_TABLET | Freq: Every day | ORAL | Status: DC

## 2013-05-31 NOTE — Progress Notes (Signed)
Pre visit review using our clinic review tool, if applicable. No additional management support is needed unless otherwise documented below in the visit note. 

## 2013-06-02 ENCOUNTER — Encounter: Payer: Self-pay | Admitting: Internal Medicine

## 2013-06-02 MED ORDER — SITAGLIP PHOS-METFORMIN HCL ER 100-1000 MG PO TB24: 1.0000 | ORAL_TABLET | Freq: Every day | ORAL | Status: DC

## 2013-06-02 NOTE — Progress Notes (Signed)
  Subjective:    Patient ID: Ann Collins, female    DOB: [birth year], 68 y.o.   MRN: [id number]  HPI 68 year old female with past history of diabetes, hypertension, hypercholesterolemia and hypothyroidism who comes in today as a work in to discuss her blood sugars and cholesterol.  Recent a1c 8.0.   No headache or vision changes.   No chest pain or tightness.  Breathing stable.  Bowels stable.  No nausea or vomiting.   AM sugars averaging - 150-190s and pm sugars 130-150.   Taking Janumet. Takes it in the evening.   Not taking her lisinopril.   Trying to watch what she eats.  On protonix.  Bowels stable.  Cholesterol elevated.  Discussed treatment options.      Past Medical History  Diagnosis Date  . Hypothyroidism   . Hypercholesterolemia   . GERD (gastroesophageal reflux disease)   . Hypertension   . Diabetes mellitus   . Hematuria   . Diverticulosis   . Kidney stones     Current Outpatient Prescriptions on File Prior to Visit  Medication Sig Dispense Refill  . levothyroxine (SYNTHROID, LEVOTHROID) 100 MCG tablet TAKE ONE TABLET BY MOUTH EVERY DAY  90 tablet  0  . lisinopril (PRINIVIL,ZESTRIL) 10 MG tablet Take 1 tablet (10 mg total) by mouth daily.  90 tablet  3  . pantoprazole (PROTONIX) 40 MG tablet Take 1 tablet (40 mg total) by mouth daily.  90 tablet  1  . SitaGLIPtin-MetFORMIN HCl (JANUMET XR) 50-500 MG TB24 Take 50-500 mg by mouth daily.  30 tablet  5   No current facility-administered medications on file prior to visit.    Review of Systems Patient denies any headache.  Previous light headedness.  Resolved now.  Saw Dr Tami Ribas.  Diagnosed with BPV.  No vision changes.   No chest pain, tightness or palpitations.  No increased shortness of breath, cough or congestion.  No nausea or vomiting.  No acid reflux.  No abdominal pain or cramping.  No bowel change, such as diarrhea, constipation, BRBPR or melana.  No urine change.  Sugars as outlined.       Objective:   Physical  Exam  Filed Vitals:   05/31/13 1029  BP: 122/60  Pulse: 62  Temp: 98.2 F (8.17 C)   68 year old female in no acute distress.   HEENT:  Nares- clear.  Oropharynx - without lesions. NECK:  Supple.  Nontender.  Left carotid bruit.  HEART:  Appears to be regular. LUNGS:  No crackles or wheezing audible.  Respirations even and unlabored.  RADIAL PULSE:  Equal bilaterally.   ABDOMEN:  Soft, nontender.  Bowel sounds present and normal.  No audible abdominal bruit.  EXTREMITIES:  No increased edema present.  DP pulses palpable and equal bilaterally.        Assessment & Plan:  LIGHT HEADEDNESS.  Resolved now.  Saw Dr Tami Ribas.  Diagnosed with BPV.  Follow.    MEDICATION NONCOMPLIANCE.   States she is taking medication regularly.      CARDIOVASCULAR.  Stress test negative for ischemia 11/10.  Currently asymptomatic.    LEFT OVARIAN CYST.  Pelvic ultrasound 12/31/10 revealed a 50mm left ovarian cyst.  Discussed with GYN.  Felt no further w/up warranted.    HEALTH MAINTENANCE.  Physical 03/09/12.  Colonoscopy 12/22/08 - diverticulosis otherwise normal.   Mammogram 04/09/13 - Birads I.  Scheduled for a physical next visit.

## 2013-06-02 NOTE — Assessment & Plan Note (Signed)
Blood pressure doing well.  Continue lisinopril.  Needs to take regularly.   Follow blood pressure.  Follow metabolic panel.

## 2013-06-02 NOTE — Assessment & Plan Note (Signed)
Sugars as outlined.  See attached list for details.   Have discussed the need to check her sugars regularly and discussed importance of diet and exercise.  Follow metabolic panel and E6L.  Keep up to date with eye checks.  Increase Janumet XR to 100/1000 q day.  Get her back in soon to reassess.

## 2013-06-02 NOTE — Assessment & Plan Note (Signed)
On thyroid replacement.  Follow tsh.  

## 2013-06-02 NOTE — Assessment & Plan Note (Signed)
EGD 12/12/08 revealed erosive esophagitis, hiatal hernia, gastritis and duodenitis.  On protonix.  Controlled.    

## 2013-06-02 NOTE — Assessment & Plan Note (Addendum)
Declines cholesterol medication.  Low cholesterol diet.  Discussed treatment.  Will start pravastatin 10mg  q day.  Check liver panel in 6 weeks.

## 2013-07-03 ENCOUNTER — Encounter: Payer: Self-pay | Admitting: Internal Medicine

## 2013-07-19 ENCOUNTER — Encounter: Payer: PRIVATE HEALTH INSURANCE | Admitting: Internal Medicine

## 2013-07-22 ENCOUNTER — Encounter: Payer: Self-pay | Admitting: Internal Medicine

## 2013-07-22 ENCOUNTER — Ambulatory Visit (INDEPENDENT_AMBULATORY_CARE_PROVIDER_SITE_OTHER): Payer: PRIVATE HEALTH INSURANCE | Admitting: Internal Medicine

## 2013-07-22 VITALS — BP 128/62 | HR 71 | Temp 98.5°F | Resp 16 | Ht 62.0 in | Wt 153.2 lb

## 2013-07-22 DIAGNOSIS — K219 Gastro-esophageal reflux disease without esophagitis: Secondary | ICD-10-CM

## 2013-07-22 DIAGNOSIS — E7800 Pure hypercholesterolemia, unspecified: Secondary | ICD-10-CM

## 2013-07-22 DIAGNOSIS — I10 Essential (primary) hypertension: Secondary | ICD-10-CM

## 2013-07-22 DIAGNOSIS — E119 Type 2 diabetes mellitus without complications: Secondary | ICD-10-CM

## 2013-07-22 DIAGNOSIS — E039 Hypothyroidism, unspecified: Secondary | ICD-10-CM

## 2013-07-22 MED ORDER — SITAGLIPTIN PHOS-METFORMIN HCL 50-500 MG PO TABS: 1.0000 | ORAL_TABLET | Freq: Two times a day (BID) | ORAL | Status: DC

## 2013-07-22 NOTE — Progress Notes (Signed)
Pre visit review using our clinic review tool, if applicable. No additional management support is needed unless otherwise documented below in the visit note. 

## 2013-07-24 LAB — HM DIABETES EYE EXAM

## 2013-07-25 ENCOUNTER — Encounter: Payer: Self-pay | Admitting: *Deleted

## 2013-08-03 ENCOUNTER — Encounter: Payer: Self-pay | Admitting: Internal Medicine

## 2013-08-03 NOTE — Progress Notes (Signed)
Subjective:    Patient ID: Ann Collins, female    DOB: 10-07-1945, 68 y.o.   MRN: [id number]  HPI 68 year old female with past history of diabetes, hypertension, hypercholesterolemia and hypothyroidism who comes in today for a scheduled follow up.  Here to follow up regarding her blood sugars.   No headache or vision changes.   No chest pain or tightness.  Breathing stable.  Bowels stable.  No nausea or vomiting.   AM sugars averaging - 120-140s and pm sugars vary depending on po intake.   See attached list for details.  Taking Janumet.  She is now taking 50-500 bid.  States she does not tolerate the 1000mg  of metformin.  She varies her medication.  Does not take scheduled dose regularly.  Discussed importance of taking her medications regularly.   Trying to watch what she eats.  On protonix.  Bowels stable.  Cholesterol elevated.        Past Medical History  Diagnosis Date  . Hypothyroidism   . Hypercholesterolemia   . GERD (gastroesophageal reflux disease)   . Hypertension   . Diabetes mellitus   . Hematuria   . Diverticulosis   . Kidney stones     Current Outpatient Prescriptions on File Prior to Visit  Medication Sig Dispense Refill  . levothyroxine (SYNTHROID, LEVOTHROID) 100 MCG tablet TAKE ONE TABLET BY MOUTH EVERY DAY  90 tablet  0  . lisinopril (PRINIVIL,ZESTRIL) 10 MG tablet Take 1 tablet (10 mg total) by mouth daily.  90 tablet  3  . pantoprazole (PROTONIX) 40 MG tablet Take 1 tablet (40 mg total) by mouth daily.  90 tablet  1  . pravastatin (PRAVACHOL) 10 MG tablet Take 1 tablet (10 mg total) by mouth daily.  30 tablet  2   No current facility-administered medications on file prior to visit.    Review of Systems Patient denies any headache.  Previous light headedness.  Resolved now.  Saw Dr Tami Ribas.  Diagnosed with BPV.  No vision changes.   No chest pain, tightness or palpitations.  No increased shortness of breath, cough or congestion.  No nausea or vomiting.  No  acid reflux.  No abdominal pain or cramping.  No bowel change, such as diarrhea, constipation, BRBPR or melana.  No urine change.  Sugars as outlined.       Objective:   Physical Exam  Filed Vitals:   07/22/13 1608  BP: 128/62  Pulse: 71  Temp: 98.5 F (36.9 C)  Resp: 27   68 year old female in no acute distress.   HEENT:  Nares- clear.  Oropharynx - without lesions. NECK:  Supple.  Nontender.  Left carotid bruit.  HEART:  Appears to be regular. LUNGS:  No crackles or wheezing audible.  Respirations even and unlabored.  RADIAL PULSE:  Equal bilaterally.   ABDOMEN:  Soft, nontender.  Bowel sounds present and normal.  No audible abdominal bruit.  EXTREMITIES:  No increased edema present.  DP pulses palpable and equal bilaterally.        Assessment & Plan:  LIGHT HEADEDNESS.  Resolved now.  Saw Dr Tami Ribas.  Diagnosed with BPV.  Follow.    MEDICATION NONCOMPLIANCE.   States she is taking medication regularly now.  Follow.     CARDIOVASCULAR.  Stress test negative for ischemia 11/10.  Currently asymptomatic.    LEFT OVARIAN CYST.  Pelvic ultrasound 12/31/10 revealed a 58mm left ovarian cyst.  Discussed with GYN.  Felt no further w/up  warranted.    HEALTH MAINTENANCE.  Was scheduled for CPE.  She declined.  Colonoscopy 12/22/08 - diverticulosis otherwise normal.   Mammogram 04/09/13 - Birads I.

## 2013-08-03 NOTE — Assessment & Plan Note (Signed)
Taking lisinopril regularly now.  Blood pressure doing well.  Follow.

## 2013-08-03 NOTE — Assessment & Plan Note (Signed)
Low cholesterol diet.  Discussed treatment. Started pravastatin 10mg  q day last visit.  Follow.

## 2013-08-03 NOTE — Assessment & Plan Note (Signed)
Sugars as outlined.  See attached list for details.   Have discussed the need to check her sugars regularly and discussed importance of diet and exercise.  Follow metabolic panel and W1U.  Keep up to date with eye checks.  She is now taking Janumet 50-500 bid.  Will continue this regimen for now.  Make sure she takes regularly.  If persistent elevation, may need to add Invokana.

## 2013-08-03 NOTE — Assessment & Plan Note (Signed)
On thyroid replacement.  Follow tsh.  

## 2013-08-03 NOTE — Assessment & Plan Note (Signed)
EGD 12/12/08 revealed erosive esophagitis, hiatal hernia, gastritis and duodenitis.  On protonix.  Controlled.

## 2013-08-22 ENCOUNTER — Other Ambulatory Visit: Payer: Self-pay | Admitting: Internal Medicine

## 2013-09-13 ENCOUNTER — Ambulatory Visit (INDEPENDENT_AMBULATORY_CARE_PROVIDER_SITE_OTHER): Payer: PRIVATE HEALTH INSURANCE | Admitting: Internal Medicine

## 2013-09-13 ENCOUNTER — Encounter: Payer: Self-pay | Admitting: Internal Medicine

## 2013-09-13 VITALS — BP 120/80 | HR 76 | Temp 99.0°F | Ht 62.0 in | Wt 155.5 lb

## 2013-09-13 DIAGNOSIS — I10 Essential (primary) hypertension: Secondary | ICD-10-CM

## 2013-09-13 DIAGNOSIS — K219 Gastro-esophageal reflux disease without esophagitis: Secondary | ICD-10-CM

## 2013-09-13 DIAGNOSIS — E7800 Pure hypercholesterolemia, unspecified: Secondary | ICD-10-CM

## 2013-09-13 DIAGNOSIS — E039 Hypothyroidism, unspecified: Secondary | ICD-10-CM

## 2013-09-13 DIAGNOSIS — E119 Type 2 diabetes mellitus without complications: Secondary | ICD-10-CM

## 2013-09-13 NOTE — Progress Notes (Signed)
Pre visit review using our clinic review tool, if applicable. No additional management support is needed unless otherwise documented below in the visit note. 

## 2013-09-14 ENCOUNTER — Other Ambulatory Visit: Payer: Self-pay | Admitting: Internal Medicine

## 2013-09-15 ENCOUNTER — Encounter: Payer: Self-pay | Admitting: Internal Medicine

## 2013-09-15 NOTE — Assessment & Plan Note (Signed)
Sugars as outlined.  See attached list for details.   Have discussed the need to check her sugars regularly and discussed importance of diet and exercise.  Follow metabolic panel and M0Q.  Keep up to date with eye checks.  She is now taking Janumet 50-500 bid.  Will continue this regimen for now.  Make sure she takes regularly.

## 2013-09-15 NOTE — Assessment & Plan Note (Signed)
On thyroid replacement.  Follow tsh.  

## 2013-09-15 NOTE — Assessment & Plan Note (Addendum)
Taking lisinopril.  Misses doses.   Blood pressure as outlined.  Stressed importance of taking medication regularly.   Follow.

## 2013-09-15 NOTE — Assessment & Plan Note (Signed)
Low cholesterol diet.  Discussed treatment.  Not taking pravastatin regularly.  She forgets.  Stressed importance of taking medication regularly.   Follow.

## 2013-09-15 NOTE — Assessment & Plan Note (Signed)
EGD 12/12/08 revealed erosive esophagitis, hiatal hernia, gastritis and duodenitis.  On protonix.  Controlled.

## 2013-09-15 NOTE — Progress Notes (Signed)
Subjective:    Patient ID: Ann Collins, female    DOB: [birth year], 68 y.o.   MRN: [id number]  HPI 68 year old female with past history of diabetes, hypertension, hypercholesterolemia and hypothyroidism who comes in today for a scheduled follow up.  Here to follow up regarding her blood sugars.   No headache or vision changes.   No chest pain or tightness.  Breathing stable.  Bowels stable.  No nausea or vomiting.   AM sugars averaging - 120-140s and pm sugars averaging 140-180.   See attached list for details.  Taking Janumet.  She is now taking 50-500 bid.  States she does not tolerate the 1000mg  of metformin.  She varies her medication.  States she is taking her medication more regularly now.  Still misses some doses.   Discussed importance of taking her medications regularly.   Trying to watch what she eats.  On protonix.  Bowels stable.  Cholesterol elevated.        Past Medical History  Diagnosis Date  . Hypothyroidism   . Hypercholesterolemia   . GERD (gastroesophageal reflux disease)   . Hypertension   . Diabetes mellitus   . Hematuria   . Diverticulosis   . Kidney stones     Current Outpatient Prescriptions on File Prior to Visit  Medication Sig Dispense Refill  . levothyroxine (SYNTHROID, LEVOTHROID) 100 MCG tablet TAKE ONE TABLET BY MOUTH ONCE DAILY. PLEASE SCHEDULE AN OFFICE VISIT TO RECIEVE FURTHER REFILLS.  90 tablet  0  . lisinopril (PRINIVIL,ZESTRIL) 10 MG tablet Take 1 tablet (10 mg total) by mouth daily.  90 tablet  3  . pantoprazole (PROTONIX) 40 MG tablet Take 1 tablet (40 mg total) by mouth daily.  90 tablet  1  . pravastatin (PRAVACHOL) 10 MG tablet Take 1 tablet (10 mg total) by mouth daily.  30 tablet  2  . sitaGLIPtin-metformin (JANUMET) 50-500 MG per tablet Take 1 tablet by mouth 2 (two) times daily with a meal.  60 tablet  2   No current facility-administered medications on file prior to visit.    Review of Systems Patient denies any headache.  Previous  light headedness.  Resolved now.  Saw Dr Tami Ribas.  Diagnosed with BPV.  No vision changes.   No chest pain, tightness or palpitations.  No increased shortness of breath, cough or congestion.  No nausea or vomiting.  No acid reflux.  No abdominal pain or cramping.  No bowel change, such as diarrhea, constipation, BRBPR or melana.  No urine change.  Sugars as outlined.  States overall she feels she is doing well.       Objective:   Physical Exam  Filed Vitals:   09/13/13 1123  BP: 120/80  Pulse: 76  Temp: 99 F (35.62 C)   68 year old female in no acute distress.   HEENT:  Nares- clear.  Oropharynx - without lesions. NECK:  Supple.  Nontender.  Left carotid bruit.  HEART:  Appears to be regular. LUNGS:  No crackles or wheezing audible.  Respirations even and unlabored.  RADIAL PULSE:  Equal bilaterally.   ABDOMEN:  Soft, nontender.  Bowel sounds present and normal.  No audible abdominal bruit.  EXTREMITIES:  No increased edema present.  DP pulses palpable and equal bilaterally.        Assessment & Plan:  LIGHT HEADEDNESS.  Resolved now.  Saw Dr Tami Ribas.  Diagnosed with BPV.  Follow.    MEDICATION NONCOMPLIANCE.   States she is  taking medication more regularly now.  Follow.     CARDIOVASCULAR.  Stress test negative for ischemia 11/10.  Currently asymptomatic.    LEFT OVARIAN CYST.  Pelvic ultrasound 12/31/10 revealed a 21mm left ovarian cyst.  Discussed with GYN.  Felt no further w/up warranted.    HEALTH MAINTENANCE.  Was scheduled for CPE.  She declined.  Colonoscopy 12/22/08 - diverticulosis otherwise normal.   Mammogram 04/09/13 - Birads I.  Reschedule cpe.

## 2013-09-16 MED ORDER — LEVOTHYROXINE SODIUM 100 MCG PO TABS: ORAL_TABLET | ORAL | Status: DC

## 2013-09-16 NOTE — Telephone Encounter (Signed)
refill sent to pharmacy

## 2013-09-20 ENCOUNTER — Other Ambulatory Visit (INDEPENDENT_AMBULATORY_CARE_PROVIDER_SITE_OTHER): Payer: PRIVATE HEALTH INSURANCE

## 2013-09-20 DIAGNOSIS — E119 Type 2 diabetes mellitus without complications: Secondary | ICD-10-CM

## 2013-09-20 DIAGNOSIS — E7800 Pure hypercholesterolemia, unspecified: Secondary | ICD-10-CM

## 2013-09-20 DIAGNOSIS — E039 Hypothyroidism, unspecified: Secondary | ICD-10-CM

## 2013-09-20 LAB — HEPATIC FUNCTION PANEL
ALK PHOS: 95 U/L (ref 39–117)
ALT: 18 U/L (ref 0–35)
AST: 16 U/L (ref 0–37)
Albumin: 3.9 g/dL (ref 3.5–5.2)
BILIRUBIN DIRECT: 0.1 mg/dL (ref 0.0–0.3)
Total Bilirubin: 0.8 mg/dL (ref 0.2–1.2)
Total Protein: 6.9 g/dL (ref 6.0–8.3)

## 2013-09-20 LAB — LIPID PANEL
CHOL/HDL RATIO: 8
CHOLESTEROL: 272 mg/dL — AB (ref 0–200)
HDL: 35.7 mg/dL — ABNORMAL LOW (ref >39.00)
NonHDL: 236.3
Triglycerides: 271 mg/dL — ABNORMAL HIGH (ref 0.0–149.0)
VLDL: 54.2 mg/dL — AB (ref 0.0–40.0)

## 2013-09-20 LAB — CBC WITH DIFFERENTIAL/PLATELET
BASOS PCT: 0.8 % (ref 0.0–3.0)
Basophils Absolute: 0.1 10*3/uL (ref 0.0–0.1)
Eosinophils Absolute: 0.4 10*3/uL (ref 0.0–0.7)
Eosinophils Relative: 4.1 % (ref 0.0–5.0)
HEMATOCRIT: 43.6 % (ref 36.0–46.0)
Hemoglobin: 14.3 g/dL (ref 12.0–15.0)
LYMPHS ABS: 3.6 10*3/uL (ref 0.7–4.0)
Lymphocytes Relative: 40.5 % (ref 12.0–46.0)
MCHC: 32.7 g/dL (ref 30.0–36.0)
MCV: 88.9 fl (ref 78.0–100.0)
MONO ABS: 0.4 10*3/uL (ref 0.1–1.0)
Monocytes Relative: 4.9 % (ref 3.0–12.0)
NEUTROS PCT: 49.7 % (ref 43.0–77.0)
Neutro Abs: 4.5 10*3/uL (ref 1.4–7.7)
PLATELETS: 166 10*3/uL (ref 150.0–400.0)
RBC: 4.9 Mil/uL (ref 3.87–5.11)
RDW: 14.8 % (ref 11.5–15.5)
WBC: 9 10*3/uL (ref 4.0–10.5)

## 2013-09-20 LAB — MICROALBUMIN / CREATININE URINE RATIO
Creatinine,U: 48.3 mg/dL
Microalb Creat Ratio: 0.8 mg/g (ref 0.0–30.0)
Microalb, Ur: 0.4 mg/dL (ref 0.0–1.9)

## 2013-09-20 LAB — BASIC METABOLIC PANEL
BUN: 18 mg/dL (ref 6–23)
CHLORIDE: 102 meq/L (ref 96–112)
CO2: 26 mEq/L (ref 19–32)
Calcium: 10.1 mg/dL (ref 8.4–10.5)
Creatinine, Ser: 0.9 mg/dL (ref 0.4–1.2)
GFR: 70.72 mL/min (ref >60.00)
GLUCOSE: 125 mg/dL — AB (ref 70–99)
Potassium: 4.4 mEq/L (ref 3.5–5.1)
Sodium: 138 mEq/L (ref 135–145)

## 2013-09-20 LAB — HEMOGLOBIN A1C: HEMOGLOBIN A1C: 7.8 % — AB (ref 4.6–6.5)

## 2013-09-20 LAB — TSH: TSH: 0.97 u[IU]/mL (ref 0.35–4.50)

## 2013-09-20 LAB — LDL CHOLESTEROL, DIRECT: Direct LDL: 191.3 mg/dL

## 2013-09-21 ENCOUNTER — Encounter: Payer: Self-pay | Admitting: Internal Medicine

## 2013-09-23 MED ORDER — PRAVASTATIN SODIUM 10 MG PO TABS: 10.0000 mg | ORAL_TABLET | Freq: Every day | ORAL | Status: DC

## 2013-09-23 NOTE — Telephone Encounter (Signed)
Reviewed my chart message.  I sent in rx for pravastatin 10mg  #30 with 3 refills to [facility].  Will recheck liver panel at her 11/15/13 appt.

## 2013-09-25 NOTE — Telephone Encounter (Signed)
Mailed unread message to pt  

## 2013-10-07 ENCOUNTER — Other Ambulatory Visit: Payer: Self-pay | Admitting: *Deleted

## 2013-10-07 MED ORDER — PANTOPRAZOLE SODIUM 40 MG PO TBEC: 40.0000 mg | DELAYED_RELEASE_TABLET | Freq: Every day | ORAL | Status: DC

## 2013-11-15 ENCOUNTER — Telehealth: Payer: Self-pay | Admitting: Internal Medicine

## 2013-11-15 ENCOUNTER — Ambulatory Visit (INDEPENDENT_AMBULATORY_CARE_PROVIDER_SITE_OTHER)
Admission: RE | Admit: 2013-11-15 | Discharge: 2013-11-15 | Disposition: A | Discharge status: Home / Self Care | Payer: PRIVATE HEALTH INSURANCE | Source: Ambulatory Visit | Attending: Internal Medicine | Admitting: Internal Medicine

## 2013-11-15 ENCOUNTER — Encounter: Payer: Self-pay | Admitting: Internal Medicine

## 2013-11-15 ENCOUNTER — Ambulatory Visit (INDEPENDENT_AMBULATORY_CARE_PROVIDER_SITE_OTHER): Payer: PRIVATE HEALTH INSURANCE | Admitting: Internal Medicine

## 2013-11-15 VITALS — BP 130/70 | HR 67 | Temp 98.3°F | Ht 62.7 in | Wt 153.8 lb

## 2013-11-15 DIAGNOSIS — R059 Cough, unspecified: Secondary | ICD-10-CM

## 2013-11-15 DIAGNOSIS — E039 Hypothyroidism, unspecified: Secondary | ICD-10-CM

## 2013-11-15 DIAGNOSIS — R05 Cough: Secondary | ICD-10-CM

## 2013-11-15 DIAGNOSIS — K2100 Gastro-esophageal reflux disease with esophagitis, without bleeding: Secondary | ICD-10-CM

## 2013-11-15 DIAGNOSIS — R062 Wheezing: Secondary | ICD-10-CM

## 2013-11-15 DIAGNOSIS — J209 Acute bronchitis, unspecified: Secondary | ICD-10-CM

## 2013-11-15 DIAGNOSIS — E119 Type 2 diabetes mellitus without complications: Secondary | ICD-10-CM

## 2013-11-15 DIAGNOSIS — I10 Essential (primary) hypertension: Secondary | ICD-10-CM

## 2013-11-15 DIAGNOSIS — E7800 Pure hypercholesterolemia, unspecified: Secondary | ICD-10-CM

## 2013-11-15 MED ORDER — FLUTICASONE PROPIONATE HFA 110 MCG/ACT IN AERO: 2.0000 | INHALATION_SPRAY | Freq: Two times a day (BID) | RESPIRATORY_TRACT | Status: DC

## 2013-11-15 MED ORDER — ALBUTEROL SULFATE HFA 108 (90 BASE) MCG/ACT IN AERS: 2.0000 | INHALATION_SPRAY | Freq: Four times a day (QID) | RESPIRATORY_TRACT | Status: DC | PRN

## 2013-11-15 MED ORDER — CEFDINIR 300 MG PO CAPS: 300.0000 mg | ORAL_CAPSULE | Freq: Two times a day (BID) | ORAL | Status: DC

## 2013-11-15 NOTE — Progress Notes (Signed)
Pre visit review using our clinic review tool, if applicable. No additional management support is needed unless otherwise documented below in the visit note. 

## 2013-11-15 NOTE — Patient Instructions (Signed)
mucinex DM one tablet in the am and Robitussin DM in the evening.    Nasacort nasal spray - 2 sprays each nostril one time per day.  Do this in the evening.    Saline nasal spray - flush nose at least 2-3x/day.  Flovent inhaler - 2 puffs twice a day.  Rinse mouth after use.    Albuterol inhaler - 2 puffs 4x/day as needed.

## 2013-11-15 NOTE — Telephone Encounter (Signed)
Can you put her in one of those spots you are opening in November?

## 2013-11-15 NOTE — Telephone Encounter (Signed)
Pt needs 6wk follow up (30 mins). Please advise.msn

## 2013-11-18 ENCOUNTER — Encounter: Payer: Self-pay | Admitting: Internal Medicine

## 2013-11-18 DIAGNOSIS — J209 Acute bronchitis, unspecified: Secondary | ICD-10-CM | POA: Insufficient documentation

## 2013-11-18 NOTE — Assessment & Plan Note (Signed)
Symptoms and exam as outlined.  Check cxr.  Continues to smoke. Discussed the need to quit.  Treat with omnicef as directed.  Albuterol and flovent inhaler as directed.  Mucinex DM and Robitussin DM as directed.  nasacort and saline nasal spray as directed.  Follow.

## 2013-11-18 NOTE — Assessment & Plan Note (Addendum)
Low cholesterol diet.  Discussed treatment.  Not taking pravastatin regularly.  She forgets.  Stressed importance of taking medication regularly.   Follow.  Hold on making changes.

## 2013-11-18 NOTE — Assessment & Plan Note (Signed)
Brought in no recorded sugar readings.  Have discussed the need to check her sugars regularly and discussed importance of diet and exercise.  Follow metabolic panel and O3K.  Keep up to date with eye checks.  She is now taking Janumet 50-500 bid.  Will continue this regimen for now.  Make sure she takes regularly.

## 2013-11-18 NOTE — Progress Notes (Signed)
Subjective:    Patient ID: Ann Collins, female    DOB: September 24, 1945, 68 y.o.   MRN: [id number]  HPI 68 year old female with past history of diabetes, hypertension, hypercholesterolemia and hypothyroidism who comes in today for a scheduled follow up.  Reports increased nasal congestion and chest congestion.  Some wheezing.  No sob.  Ears feel full.  Has been present now for 4 weeks.  Still smoking 1ppd.  Discussed the need to quit.   No headache or vision changes.   No chest pain or tightness.   Bowels stable.  No nausea or vomiting.   Brought in no recorded sugar readings. Taking Janumet.  She is now taking 50-500 bid.  States she does not tolerate the 1000mg  of metformin.  She varies her medication.  States she is taking her medication more regularly now.   Trying to watch what she eats.  On protonix.  Bowels stable.  Cholesterol elevated.        Past Medical History  Diagnosis Date  . Hypothyroidism   . Hypercholesterolemia   . GERD (gastroesophageal reflux disease)   . Hypertension   . Diabetes mellitus   . Hematuria   . Diverticulosis   . Kidney stones     Current Outpatient Prescriptions on File Prior to Visit  Medication Sig Dispense Refill  . levothyroxine (SYNTHROID, LEVOTHROID) 100 MCG tablet TAKE ONE TABLET BY MOUTH ONCE DAILY. PLEASE SCHEDULE AN OFFICE VISIT TO RECIEVE FURTHER REFILLS.  90 tablet  1  . lisinopril (PRINIVIL,ZESTRIL) 10 MG tablet Take 1 tablet (10 mg total) by mouth daily.  90 tablet  3  . pantoprazole (PROTONIX) 40 MG tablet Take 1 tablet (40 mg total) by mouth daily.  90 tablet  1  . pravastatin (PRAVACHOL) 10 MG tablet Take 1 tablet (10 mg total) by mouth daily.  30 tablet  3  . sitaGLIPtin-metformin (JANUMET) 50-500 MG per tablet Take 1 tablet by mouth 2 (two) times daily with a meal.  60 tablet  2   No current facility-administered medications on file prior to visit.    Review of Systems Patient denies any headache.  Previous light headedness.   Resolved now.  Saw Dr Tami Ribas.  Diagnosed with BPV.  No vision changes.   Does have the increased nasal congestion and chest congestion.  No chest pain, tightness or palpitations.  No increased shortness of breath.  Increased cough and wheezing.   No nausea or vomiting.  No acid reflux.  No abdominal pain or cramping.  No bowel change, such as diarrhea, constipation, BRBPR or melana.  No urine change.  Brought in no recorded sugar readings.  Cholesterol elevated.  Not taking her cholesterol medication regularly.  Continues to smoke.        Objective:   Physical Exam  Filed Vitals:   11/15/13 1130  BP: 130/70  Pulse: 67  Temp: 98.3 F (103.72 C)   68 year old female in no acute distress.   HEENT:  Nares- clear.  Oropharynx - without lesions. NECK:  Supple.  Nontender.  Left carotid bruit.  HEART:  Appears to be regular. LUNGS:  No crackles.  Some increased wheezing with forced expirations.  Congestion mostly clears with coughing.   Respirations even and unlabored.  RADIAL PULSE:  Equal bilaterally.   BREASTS:  No nipple discharge or nipple retraction present.  Could not appreciate any nodules or axillary adenopathy.  ABDOMEN:  Soft, nontender.  Bowel sounds present and normal.  No audible abdominal  bruit.  GU:  Not performed.   EXTREMITIES:  No increased edema present.  DP pulses palpable and equal bilaterally.   FEET:  No lesions.        Assessment & Plan:  LIGHT HEADEDNESS.  Resolved now.  Saw Dr Tami Ribas.  Diagnosed with BPV.  Follow.    MEDICATION NONCOMPLIANCE.   States she is taking medication more regularly now.  Follow.  Not taking pravastatin regularly.     CARDIOVASCULAR.  Stress test negative for ischemia 11/10.  Currently asymptomatic.    LEFT OVARIAN CYST.  Pelvic ultrasound 12/31/10 revealed a 63mm left ovarian cyst.  Discussed with GYN.  Felt no further w/up warranted.    HEALTH MAINTENANCE.  Breast exam today as outlined.   Colonoscopy 12/22/08 - diverticulosis otherwise  normal.   States brother had to have part of his colon removed and she is unsure why.  Niece with polyps.  Discuss with GI if due.  Mammogram 04/09/13 - Birads I.

## 2013-11-18 NOTE — Assessment & Plan Note (Signed)
On thyroid replacement.  Follow tsh.  

## 2013-11-18 NOTE — Assessment & Plan Note (Signed)
Taking lisinopril.  Misses doses.   Blood pressure as outlined.  Stressed importance of taking medication regularly.   Follow.

## 2013-11-18 NOTE — Assessment & Plan Note (Signed)
EGD 12/12/08 revealed erosive esophagitis, hiatal hernia, gastritis and duodenitis.  On protonix.  Controlled.

## 2013-11-27 LAB — LIPID PANEL
CHOLESTEROL: 245 mg/dL — AB (ref 0–200)
HDL: 44 mg/dL (ref 35–70)
LDL Cholesterol: 147 mg/dL
LDL/HDL RATIO: 5.6
TRIGLYCERIDES: 269 mg/dL — AB (ref 40–160)

## 2013-11-27 LAB — HEPATIC FUNCTION PANEL
ALT: 15 U/L (ref 7–35)
AST: 15 U/L (ref 13–35)
Alkaline Phosphatase: 100 U/L (ref 25–125)
Bilirubin, Total: 0.3 mg/dL

## 2013-11-27 LAB — BASIC METABOLIC PANEL
BUN: 15 mg/dL (ref 4–21)
Creatinine: 1 mg/dL (ref 0.5–1.1)
Glucose: 150 mg/dL
POTASSIUM: 4.6 mmol/L (ref 3.4–5.3)
SODIUM: 145 mmol/L (ref 137–147)

## 2013-11-27 LAB — CBC AND DIFFERENTIAL
HEMATOCRIT: 42 % (ref 36–46)
HEMOGLOBIN: 14 g/dL (ref 12.0–16.0)
Platelets: 215 10*3/uL (ref 150–399)
WBC: 9 10^3/mL

## 2013-11-27 LAB — TSH: TSH: 1.13 u[IU]/mL (ref 0.41–5.90)

## 2013-11-27 LAB — HEMOGLOBIN A1C: HEMOGLOBIN A1C: 7.8 % — AB (ref 4.0–6.0)

## 2013-12-16 ENCOUNTER — Ambulatory Visit: Payer: PRIVATE HEALTH INSURANCE | Admitting: Internal Medicine

## 2013-12-27 ENCOUNTER — Ambulatory Visit (INDEPENDENT_AMBULATORY_CARE_PROVIDER_SITE_OTHER): Payer: PRIVATE HEALTH INSURANCE | Admitting: Internal Medicine

## 2013-12-27 ENCOUNTER — Encounter: Payer: Self-pay | Admitting: Internal Medicine

## 2013-12-27 VITALS — BP 140/80 | HR 68 | Temp 98.7°F | Ht 62.7 in | Wt 153.5 lb

## 2013-12-27 DIAGNOSIS — I10 Essential (primary) hypertension: Secondary | ICD-10-CM

## 2013-12-27 DIAGNOSIS — E7800 Pure hypercholesterolemia, unspecified: Secondary | ICD-10-CM

## 2013-12-27 DIAGNOSIS — K2100 Gastro-esophageal reflux disease with esophagitis, without bleeding: Secondary | ICD-10-CM

## 2013-12-27 DIAGNOSIS — E119 Type 2 diabetes mellitus without complications: Secondary | ICD-10-CM

## 2013-12-27 DIAGNOSIS — E039 Hypothyroidism, unspecified: Secondary | ICD-10-CM

## 2013-12-27 MED ORDER — PANTOPRAZOLE SODIUM 40 MG PO TBEC: 40.0000 mg | DELAYED_RELEASE_TABLET | Freq: Two times a day (BID) | ORAL | Status: DC

## 2013-12-27 NOTE — Progress Notes (Signed)
Pre visit review using our clinic review tool, if applicable. No additional management support is needed unless otherwise documented below in the visit note. 

## 2014-01-03 ENCOUNTER — Encounter: Payer: Self-pay | Admitting: Internal Medicine

## 2014-01-03 NOTE — Progress Notes (Signed)
Subjective:    Patient ID: Ann Collins, female    DOB: 08/02/1945, 68 y.o.   MRN: [id number]  HPI 68 year old female with past history of diabetes, hypertension, hypercholesterolemia and hypothyroidism who comes in today for a scheduled follow up.  Reports still having some increased nasal congestion and chest congestion.   No sob.  Is better.  Still smoking 1ppd.  Discussed the need to quit.   No headache or vision changes.   No chest pain or tightness.   Bowels stable.  No nausea or vomiting.   Brought in no recorded sugar readings. Taking Janumet.  Now taking only one per day on half days. States she does not tolerate the 1000mg  of metformin.  She varies her medication.    Trying to watch what she eats.  On protonix.  Still some break through symptoms.  Bowels stable.  Cholesterol elevated.  Not taking her cholesterol medication or her lisinopril regularly.  Again discussed the importance of compliance.     Past Medical History  Diagnosis Date  . Hypothyroidism   . Hypercholesterolemia   . GERD (gastroesophageal reflux disease)   . Hypertension   . Diabetes mellitus   . Hematuria   . Diverticulosis   . Kidney stones     Current Outpatient Prescriptions on File Prior to Visit  Medication Sig Dispense Refill  . albuterol (PROVENTIL HFA;VENTOLIN HFA) 108 (90 BASE) MCG/ACT inhaler Inhale 2 puffs into the lungs every 6 (six) hours as needed for wheezing or shortness of breath. 1 Inhaler 0  . fluticasone (FLOVENT HFA) 110 MCG/ACT inhaler Inhale 2 puffs into the lungs 2 (two) times daily. 1 Inhaler 0  . levothyroxine (SYNTHROID, LEVOTHROID) 100 MCG tablet TAKE ONE TABLET BY MOUTH ONCE DAILY. PLEASE SCHEDULE AN OFFICE VISIT TO RECIEVE FURTHER REFILLS. 90 tablet 1  . lisinopril (PRINIVIL,ZESTRIL) 10 MG tablet Take 1 tablet (10 mg total) by mouth daily. 90 tablet 3  . pravastatin (PRAVACHOL) 10 MG tablet Take 1 tablet (10 mg total) by mouth daily. 30 tablet 3  . sitaGLIPtin-metformin  (JANUMET) 50-500 MG per tablet Take 1 tablet by mouth 2 (two) times daily with a meal. 60 tablet 2   No current facility-administered medications on file prior to visit.    Review of Systems Patient denies any headache.  Previous light headedness.  Resolved now.  Saw Dr Tami Ribas.  Diagnosed with BPV.  No vision changes.   Does have the increased nasal congestion and chest congestion.  No chest pain, tightness or palpitations.  No increased shortness of breath.  Increased cough and wheezing.   No nausea or vomiting.  No acid reflux.  No abdominal pain or cramping.  No bowel change, such as diarrhea, constipation, BRBPR or melana.  No urine change.  Brought in no recorded sugar readings.  Cholesterol elevated.  Not taking her cholesterol medication regularly.  Continues to smoke.   Discussed the need to quit.       Objective:   Physical Exam  Filed Vitals:   12/27/13 1611  BP: 140/80  Pulse: 68  Temp: 98.7 F (8.2 C)   68 year old female in no acute distress.   HEENT:  Nares- slightly erythematous turbinates.  Oropharynx - without lesions. NECK:  Supple.  Nontender.  Left carotid bruit.  HEART:  Appears to be regular. LUNGS:  No crackles.  Some increased wheezing with forced expirations.  Congestion mostly clears with coughing.   Respirations even and unlabored.  RADIAL PULSE:  Equal bilaterally.  ABDOMEN:  Soft, nontender.  Bowel sounds present and normal.  No audible abdominal bruit.   EXTREMITIES:  No increased edema present.  DP pulses palpable and equal bilaterally.   FEET:  No lesions.        Assessment & Plan:  Essential hypertension Blood pressure as outlined.  Not taking her lisinopril.  Restart.  Follow.    Gastroesophageal reflux disease with esophagitis Still some increased acid reflux.  Increase protonix to bid.  Follow.    Type 2 diabetes mellitus without complication Did not bring in any sugar readings.  Last a1c 7.8.  Not taking her janumet regularly.  Discussed  the importance of taking her medication regularly.  Follow.  Keep up to date with eye checks.    Hypothyroidism, unspecified hypothyroidism type On thyroid replacement.  Follow tsh.   Hypercholesterolemia Low cholesterol diet and exercise.  On pravastatin.  Follow lipid panel and liver function tests.    LIGHT HEADEDNESS.  Resolved now.  Saw Dr Tami Ribas.  Diagnosed with BPV.  Follow.    MEDICATION NONCOMPLIANCE.    Not taking pravastatin or her lisinopril regularly.   Apparently not taking her janumet regularly.  Discussed the importance of taking her medication regularly.    CARDIOVASCULAR.  Stress test negative for ischemia 11/10.  Currently asymptomatic.    LEFT OVARIAN CYST.  Pelvic ultrasound 12/31/10 revealed a 2mm left ovarian cyst.  Discussed with GYN.  Felt no further w/up warranted.   CONGESTION.  Symptoms as outlined.  Treat reflux.  Continue inhalers.  mucinex and robitussin as outlined.  Saline nasal spray as directed.  Follow.     HEALTH MAINTENANCE.  Breast exam last visit as outlined.   Colonoscopy 12/22/08 - diverticulosis otherwise normal.   States brother had to have part of his colon removed and she is unsure why.  Niece with polyps.  Discuss with GI if due.  Mammogram 04/09/13 - Birads I.

## 2014-01-13 ENCOUNTER — Other Ambulatory Visit: Payer: Self-pay | Admitting: *Deleted

## 2014-01-13 MED ORDER — PRAVASTATIN SODIUM 10 MG PO TABS
10.0000 mg | ORAL_TABLET | Freq: Every day | ORAL | Status: DC
Start: 2014-01-13 — End: 2014-09-09

## 2014-01-21 ENCOUNTER — Encounter: Payer: Self-pay | Admitting: Internal Medicine

## 2014-05-12 ENCOUNTER — Other Ambulatory Visit: Payer: Self-pay | Admitting: *Deleted

## 2014-05-12 MED ORDER — SITAGLIPTIN PHOS-METFORMIN HCL 50-500 MG PO TABS: 1.0000 | ORAL_TABLET | Freq: Two times a day (BID) | ORAL | Status: DC

## 2014-05-12 NOTE — Telephone Encounter (Signed)
Husband walked up to Dr. Nicki Reaper on his way to lab & handed Dr. Nicki Reaper a note that was a refill request for Janumet. Refill sent in for a 30 day supply & a note was added to Rx to notify patient that she is overdue for a f/u appt & to contact the office soon for an appointment.

## 2014-06-18 ENCOUNTER — Other Ambulatory Visit: Payer: Self-pay | Admitting: Internal Medicine

## 2014-06-19 MED ORDER — LEVOTHYROXINE SODIUM 100 MCG PO TABS: ORAL_TABLET | ORAL | Status: DC

## 2014-06-19 MED ORDER — SITAGLIPTIN PHOS-METFORMIN HCL 50-500 MG PO TABS: 1.0000 | ORAL_TABLET | Freq: Two times a day (BID) | ORAL | Status: DC

## 2014-06-19 NOTE — Addendum Note (Signed)
Addended by: Wynonia Lawman E on: 06/19/2014 08:21 AM   Modules accepted: Orders

## 2014-06-20 ENCOUNTER — Other Ambulatory Visit: Payer: Self-pay

## 2014-06-20 MED ORDER — PANTOPRAZOLE SODIUM 40 MG PO TBEC: 40.0000 mg | DELAYED_RELEASE_TABLET | Freq: Two times a day (BID) | ORAL | Status: DC

## 2014-07-22 ENCOUNTER — Ambulatory Visit (INDEPENDENT_AMBULATORY_CARE_PROVIDER_SITE_OTHER): Payer: PRIVATE HEALTH INSURANCE | Admitting: Internal Medicine

## 2014-07-22 ENCOUNTER — Telehealth: Payer: Self-pay | Admitting: Internal Medicine

## 2014-07-22 ENCOUNTER — Encounter: Payer: Self-pay | Admitting: Internal Medicine

## 2014-07-22 VITALS — BP 157/68 | HR 70 | Temp 97.8°F | Resp 18 | Ht 62.7 in | Wt 159.0 lb

## 2014-07-22 DIAGNOSIS — E7800 Pure hypercholesterolemia, unspecified: Secondary | ICD-10-CM

## 2014-07-22 DIAGNOSIS — I10 Essential (primary) hypertension: Secondary | ICD-10-CM

## 2014-07-22 DIAGNOSIS — K2100 Gastro-esophageal reflux disease with esophagitis, without bleeding: Secondary | ICD-10-CM

## 2014-07-22 DIAGNOSIS — Z0000 Encounter for general adult medical examination without abnormal findings: Secondary | ICD-10-CM

## 2014-07-22 DIAGNOSIS — E039 Hypothyroidism, unspecified: Secondary | ICD-10-CM

## 2014-07-22 DIAGNOSIS — E119 Type 2 diabetes mellitus without complications: Secondary | ICD-10-CM

## 2014-07-22 NOTE — Progress Notes (Signed)
Pre visit review using our clinic review tool, if applicable. No additional management support is needed unless otherwise documented below in the visit note. 

## 2014-07-22 NOTE — Telephone Encounter (Signed)
Pt will have labs done at [place]. Pt will need requisition mailed to her.msn

## 2014-07-22 NOTE — Progress Notes (Signed)
Patient ID: Ann Collins, female   DOB: 26-Oct-1945, 69 y.o.   MRN: [id number]   Subjective:    Patient ID: Ann Collins, female    DOB: September 23, 1945, 69 y.o.   MRN: [id number]  HPI  Patient here for a scheduled follow up.  Not taking her cholesterol medication and desires not to take.  Wants to take otc supplements.  She is not taking her lisinopril.  Discussed the need to restart.  She is taking her janumet.  Brought in no recorded sugar readings.  Not checking regularly.  Discussed diet and exercise.  Discussed the need to start checking her sugars regularly.  Sees Dr Matilde Sprang for her eye exams.  States is up to date.  Stays active.  No cardiac symptoms with increased activity or exertion.  Breathing stable.  Bowels stable.  Handling stress.     Past Medical History  Diagnosis Date  . Hypothyroidism   . Hypercholesterolemia   . GERD (gastroesophageal reflux disease)   . Hypertension   . Diabetes mellitus   . Hematuria   . Diverticulosis   . Kidney stones     Current Outpatient Prescriptions on File Prior to Visit  Medication Sig Dispense Refill  . albuterol (PROVENTIL HFA;VENTOLIN HFA) 108 (90 BASE) MCG/ACT inhaler Inhale 2 puffs into the lungs every 6 (six) hours as needed for wheezing or shortness of breath. 1 Inhaler 0  . fluticasone (FLOVENT HFA) 110 MCG/ACT inhaler Inhale 2 puffs into the lungs 2 (two) times daily. 1 Inhaler 0  . pantoprazole (PROTONIX) 40 MG tablet Take 1 tablet (40 mg total) by mouth 2 (two) times daily before a meal. 180 tablet 1  . sitaGLIPtin-metformin (JANUMET) 50-500 MG per tablet Take 1 tablet by mouth 2 (two) times daily with a meal. NEED APPT FOR ADDITIONAL REFILLS. CALL OFFICE ASAP @ [phone]. 60 tablet 0  . pravastatin (PRAVACHOL) 10 MG tablet Take 1 tablet (10 mg total) by mouth daily. (Patient not taking: Reported on 07/22/2014) 30 tablet 5   No current facility-administered medications on file prior to visit.    Review of Systems    Constitutional: Negative for appetite change and unexpected weight change.  HENT: Negative for congestion and sinus pressure.   Respiratory: Negative for cough, chest tightness and shortness of breath.   Cardiovascular: Negative for chest pain, palpitations and leg swelling.  Gastrointestinal: Negative for nausea, vomiting, abdominal pain and diarrhea.  Genitourinary: Negative for dysuria and difficulty urinating.  Skin: Negative for color change and rash.  Neurological: Negative for dizziness, light-headedness and headaches.  Hematological: Negative for adenopathy. Does not bruise/bleed easily.  Psychiatric/Behavioral: Negative for dysphoric mood and agitation.       Objective:     Blood pressure recheck:  142/72  Physical Exam  Constitutional: She appears well-developed and well-nourished. No distress.  HENT:  Nose: Nose normal.  Mouth/Throat: Oropharynx is clear and moist.  Neck: Neck supple. No thyromegaly present.  Cardiovascular: Normal rate and regular rhythm.   Pulmonary/Chest: Breath sounds normal. No respiratory distress. She has no wheezes.  Abdominal: Soft. Bowel sounds are normal. There is no tenderness.  Musculoskeletal: She exhibits no edema or tenderness.  Lymphadenopathy:    She has no cervical adenopathy.  Skin: No rash noted. No erythema.  Psychiatric: She has a normal mood and affect.    BP 157/68 mmHg  Pulse 70  Temp(Src) 97.8 F (36.6 C) (Oral)  Resp 18  Ht 5' 2.7" (1.593 m)  Wt 159 lb (72.122 kg)  BMI 28.42 kg/m2  SpO2 97%  LMP 03/10/1991 Wt Readings from Last 3 Encounters:  07/22/14 159 lb (72.122 kg)  12/27/13 153 lb 8 oz (69.627 kg)  11/15/13 153 lb 12 oz (69.741 kg)     Lab Results  Component Value Date   WBC 9.0 11/27/2013   HGB 14.0 11/27/2013   HCT 42 11/27/2013   PLT 215 11/27/2013   GLUCOSE 125* 09/20/2013   CHOL 245* 11/27/2013   TRIG 269* 11/27/2013   HDL 44 11/27/2013   LDLDIRECT 191.3 09/20/2013   LDLCALC 147  11/27/2013   ALT 15 11/27/2013   AST 15 11/27/2013   NA 145 11/27/2013   K 4.6 11/27/2013   CL 102 09/20/2013   CREATININE 1.0 11/27/2013   BUN 15 11/27/2013   CO2 26 09/20/2013   TSH 1.13 11/27/2013   HGBA1C 7.8* 11/27/2013   MICROALBUR 0.4 09/20/2013    Dg Chest 2 View  11/15/2013   CLINICAL DATA:  Cough, wheezing.  Smoker.  EXAM: CHEST  2 VIEW  COMPARISON:  None.  FINDINGS: The heart size and mediastinal contours are within normal limits. Both lungs are clear. The visualized skeletal structures are unremarkable.  IMPRESSION: No active cardiopulmonary disease.   Electronically Signed   By: Rolm Baptise M.D.   On: 11/15/2013 14:46       Assessment & Plan:   Problem List Items Addressed This Visit    Diabetes mellitus    Not checking sugars regularly.  Taking janumet.  Low carb diet and exercise.  Follow met b and a1c.  Sees Dr Matilde Sprang for her eyes.        Relevant Medications   lisinopril (PRINIVIL,ZESTRIL) 10 MG tablet   GERD (gastroesophageal reflux disease)    Had EGD 12/12/08 - erosive esophagitis, hiatal hernia, gastritis and duodenitis.  On protonix.        Health care maintenance    Schedule her for a physical.  Declines mammogram this year.  Colonoscopy 12/2008 - diverticulosis otherwise normal.  Brother had part of colon removed. Unsure of reason.  Contact GI.        Hypercholesterolemia    Not taking her cholesterol medication.  Discussed the need to take her medications regularly.  Discussed the need to treat her cholesterol.  Declines statin medication.  Wants to try otc medication.  Follow.        Relevant Medications   lisinopril (PRINIVIL,ZESTRIL) 10 MG tablet   Hypertension - Primary    Blood pressure slightly elevated today.  Not taking lisinopril.  Restart.  Follow metabolic panel.        Relevant Medications   lisinopril (PRINIVIL,ZESTRIL) 10 MG tablet   Hypothyroidism    On thyroid replacement.  Follow tsh.        Relevant Medications    levothyroxine (SYNTHROID, LEVOTHROID) 100 MCG tablet     I spent 25 minutes with the patient and more than 50% of the time was spent in consultation regarding the above.     Einar Pheasant, MD

## 2014-07-23 NOTE — Telephone Encounter (Signed)
Form completed and placed in your box.  

## 2014-07-24 NOTE — Telephone Encounter (Signed)
Lab order mailed to patient.

## 2014-07-29 ENCOUNTER — Encounter: Payer: Self-pay | Admitting: Internal Medicine

## 2014-07-29 DIAGNOSIS — Z0000 Encounter for general adult medical examination without abnormal findings: Secondary | ICD-10-CM | POA: Insufficient documentation

## 2014-07-29 MED ORDER — LEVOTHYROXINE SODIUM 100 MCG PO TABS: ORAL_TABLET | ORAL | Status: DC

## 2014-07-29 MED ORDER — LISINOPRIL 10 MG PO TABS: 10.0000 mg | ORAL_TABLET | Freq: Every day | ORAL | Status: DC

## 2014-07-29 NOTE — Assessment & Plan Note (Signed)
Not taking her cholesterol medication.  Discussed the need to take her medications regularly.  Discussed the need to treat her cholesterol.  Declines statin medication.  Wants to try otc medication.  Follow.

## 2014-07-29 NOTE — Assessment & Plan Note (Signed)
Had EGD 12/12/08 - erosive esophagitis, hiatal hernia, gastritis and duodenitis.  On protonix.

## 2014-07-29 NOTE — Assessment & Plan Note (Signed)
Schedule her for a physical.  Declines mammogram this year.  Colonoscopy 12/2008 - diverticulosis otherwise normal.  Brother had part of colon removed. Unsure of reason.  Contact GI.

## 2014-07-29 NOTE — Assessment & Plan Note (Signed)
Not checking sugars regularly.  Taking janumet.  Low carb diet and exercise.  Follow met b and a1c.  Sees Dr Matilde Sprang for her eyes.

## 2014-07-29 NOTE — Assessment & Plan Note (Signed)
Blood pressure slightly elevated today.  Not taking lisinopril.  Restart.  Follow metabolic panel.

## 2014-07-29 NOTE — Assessment & Plan Note (Signed)
On thyroid replacement.  Follow tsh.  

## 2014-08-08 ENCOUNTER — Other Ambulatory Visit (INDEPENDENT_AMBULATORY_CARE_PROVIDER_SITE_OTHER): Payer: PRIVATE HEALTH INSURANCE

## 2014-08-08 ENCOUNTER — Other Ambulatory Visit: Payer: PRIVATE HEALTH INSURANCE

## 2014-08-08 DIAGNOSIS — E119 Type 2 diabetes mellitus without complications: Secondary | ICD-10-CM | POA: Diagnosis not present

## 2014-08-08 DIAGNOSIS — E7800 Pure hypercholesterolemia, unspecified: Secondary | ICD-10-CM

## 2014-08-08 LAB — BASIC METABOLIC PANEL
BUN: 20 mg/dL (ref 6–23)
CO2: 25 mEq/L (ref 19–32)
Calcium: 9.4 mg/dL (ref 8.4–10.5)
Chloride: 105 mEq/L (ref 96–112)
Creatinine, Ser: 1.04 mg/dL (ref 0.40–1.20)
GFR: 55.89 mL/min — ABNORMAL LOW (ref >60.00)
Glucose, Bld: 150 mg/dL — ABNORMAL HIGH (ref 70–99)
Potassium: 4.5 mEq/L (ref 3.5–5.1)
Sodium: 140 mEq/L (ref 135–145)

## 2014-08-08 LAB — HEMOGLOBIN A1C: Hgb A1c MFr Bld: 8.4 % — ABNORMAL HIGH (ref 4.6–6.5)

## 2014-08-08 LAB — LIPID PANEL
Cholesterol: 260 mg/dL — ABNORMAL HIGH (ref 0–200)
HDL: 35.3 mg/dL — ABNORMAL LOW (ref >39.00)
NonHDL: 224.7
Total CHOL/HDL Ratio: 7
Triglycerides: 346 mg/dL — ABNORMAL HIGH (ref 0.0–149.0)
VLDL: 69.2 mg/dL — ABNORMAL HIGH (ref 0.0–40.0)

## 2014-08-08 LAB — HEPATIC FUNCTION PANEL
ALT: 12 U/L (ref 0–35)
AST: 13 U/L (ref 0–37)
Albumin: 3.8 g/dL (ref 3.5–5.2)
Alkaline Phosphatase: 99 U/L (ref 39–117)
Bilirubin, Direct: 0 mg/dL (ref 0.0–0.3)
Total Bilirubin: 0.2 mg/dL (ref 0.2–1.2)
Total Protein: 6.9 g/dL (ref 6.0–8.3)

## 2014-08-08 LAB — TSH: TSH: 1.2 u[IU]/mL (ref 0.35–4.50)

## 2014-08-08 LAB — LDL CHOLESTEROL, DIRECT: Direct LDL: 146 mg/dL

## 2014-08-13 NOTE — Progress Notes (Signed)
Attempted to call patient regarding appointment, left message to return my call.

## 2014-08-21 ENCOUNTER — Other Ambulatory Visit: Payer: Self-pay

## 2014-08-21 MED ORDER — PANTOPRAZOLE SODIUM 40 MG PO TBEC: 40.0000 mg | DELAYED_RELEASE_TABLET | Freq: Two times a day (BID) | ORAL | Status: DC

## 2014-09-09 ENCOUNTER — Encounter: Payer: Self-pay | Admitting: Internal Medicine

## 2014-09-09 ENCOUNTER — Ambulatory Visit (INDEPENDENT_AMBULATORY_CARE_PROVIDER_SITE_OTHER): Payer: PRIVATE HEALTH INSURANCE | Admitting: Internal Medicine

## 2014-09-09 VITALS — BP 108/60 | HR 70 | Temp 98.1°F | Ht 62.7 in | Wt 154.1 lb

## 2014-09-09 DIAGNOSIS — E119 Type 2 diabetes mellitus without complications: Secondary | ICD-10-CM | POA: Diagnosis not present

## 2014-09-09 DIAGNOSIS — E7800 Pure hypercholesterolemia, unspecified: Secondary | ICD-10-CM

## 2014-09-09 DIAGNOSIS — I10 Essential (primary) hypertension: Secondary | ICD-10-CM

## 2014-09-09 DIAGNOSIS — K2100 Gastro-esophageal reflux disease with esophagitis, without bleeding: Secondary | ICD-10-CM

## 2014-09-09 MED ORDER — METFORMIN HCL 500 MG PO TABS
500.0000 mg | ORAL_TABLET | Freq: Every day | ORAL | Status: DC
Start: 2014-09-09 — End: 2014-10-29

## 2014-09-09 MED ORDER — SITAGLIPTIN PHOSPHATE 100 MG PO TABS: 100.0000 mg | ORAL_TABLET | Freq: Every day | ORAL | Status: DC

## 2014-09-09 NOTE — Progress Notes (Signed)
Patient ID: Ann Collins, female   DOB: 06-29-1945, 69 y.o.   MRN: [id number]   Subjective:    Patient ID: Ann Collins, female    DOB: 06/27/1945, 69 y.o.   MRN: [id number]  HPI  Patient here for a scheduled follow up.  Here to discuss her blood sugars.  She brought in no recorded sugar readings.  Not checking regularly.  Increased stress recently.  Son has been in the ICU - pancreatitis.  Has an alcohol problem.  She does not feel she needs any further intervention at this time.  Stays active.  No cardiac symptoms with increased activity or exertion.  No sob.  Bowels stable.  Taking her medication.  Continues to decline cholesterol medication.  Discussed with her today.    Past Medical History  Diagnosis Date  . Hypothyroidism   . Hypercholesterolemia   . GERD (gastroesophageal reflux disease)   . Hypertension   . Diabetes mellitus   . Hematuria   . Diverticulosis   . Kidney stones     Outpatient Encounter Prescriptions as of 09/09/2014  Medication Sig  . levothyroxine (SYNTHROID, LEVOTHROID) 100 MCG tablet TAKE ONE TABLET BY MOUTH ONCE DAILY.  Marland Kitchen lisinopril (PRINIVIL,ZESTRIL) 10 MG tablet Take 1 tablet (10 mg total) by mouth daily.  . pantoprazole (PROTONIX) 40 MG tablet Take 1 tablet (40 mg total) by mouth 2 (two) times daily before a meal.  . [DISCONTINUED] albuterol (PROVENTIL HFA;VENTOLIN HFA) 108 (90 BASE) MCG/ACT inhaler Inhale 2 puffs into the lungs every 6 (six) hours as needed for wheezing or shortness of breath.  . [DISCONTINUED] fluticasone (FLOVENT HFA) 110 MCG/ACT inhaler Inhale 2 puffs into the lungs 2 (two) times daily.  . [DISCONTINUED] pravastatin (PRAVACHOL) 10 MG tablet Take 1 tablet (10 mg total) by mouth daily.  . [DISCONTINUED] sitaGLIPtin-metformin (JANUMET) 50-500 MG per tablet Take 1 tablet by mouth 2 (two) times daily with a meal. NEED APPT FOR ADDITIONAL REFILLS. CALL OFFICE ASAP @ [phone].  . metFORMIN (GLUCOPHAGE) 500 MG tablet Take 1 tablet (500 mg  total) by mouth daily.  . sitaGLIPtin (JANUVIA) 100 MG tablet Take 1 tablet (100 mg total) by mouth daily.   No facility-administered encounter medications on file as of 09/09/2014.    Review of Systems  Constitutional: Negative for appetite change and unexpected weight change.  HENT: Negative for congestion and sinus pressure.   Respiratory: Negative for cough, chest tightness and shortness of breath.   Cardiovascular: Negative for chest pain, palpitations and leg swelling.  Gastrointestinal: Negative for nausea, vomiting, abdominal pain and diarrhea.  Genitourinary: Negative for dysuria and difficulty urinating.  Musculoskeletal: Negative for joint swelling.  Skin: Negative for color change and rash.  Neurological: Negative for dizziness, light-headedness and headaches.  Psychiatric/Behavioral: Negative for dysphoric mood and agitation.       Increased stress as outlined.         Objective:    Physical Exam  Constitutional: She appears well-developed and well-nourished. No distress.  HENT:  Nose: Nose normal.  Mouth/Throat: Oropharynx is clear and moist.  Neck: Neck supple. No thyromegaly present.  Cardiovascular: Normal rate and regular rhythm.   Pulmonary/Chest: Breath sounds normal. No respiratory distress. She has no wheezes.  Abdominal: Soft. Bowel sounds are normal. There is no tenderness.  Musculoskeletal: She exhibits no edema or tenderness.  Lymphadenopathy:    She has no cervical adenopathy.  Skin: No rash noted. No erythema.  Psychiatric: She has a normal mood and affect. Her behavior is  normal.    BP 108/60 mmHg  Pulse 70  Temp(Src) 98.1 F (36.7 C) (Oral)  Ht 5' 2.7" (1.593 m)  Wt 154 lb 2 oz (69.911 kg)  BMI 27.55 kg/m2  SpO2 96%  LMP 03/10/1991 Wt Readings from Last 3 Encounters:  09/09/14 154 lb 2 oz (69.911 kg)  07/22/14 159 lb (72.122 kg)  12/27/13 153 lb 8 oz (69.627 kg)     Lab Results  Component Value Date   WBC 9.0 11/27/2013   HGB 14.0  11/27/2013   HCT 42 11/27/2013   PLT 215 11/27/2013   GLUCOSE 150* 08/08/2014   CHOL 260* 08/08/2014   TRIG 346.0* 08/08/2014   HDL 35.30* 08/08/2014   LDLDIRECT 146.0 08/08/2014   LDLCALC 147 11/27/2013   ALT 12 08/08/2014   AST 13 08/08/2014   NA 140 08/08/2014   K 4.5 08/08/2014   CL 105 08/08/2014   CREATININE 1.04 08/08/2014   BUN 20 08/08/2014   CO2 25 08/08/2014   TSH 1.20 08/08/2014   HGBA1C 8.4* 08/08/2014   MICROALBUR 0.4 09/20/2013    Dg Chest 2 View  11/15/2013   CLINICAL DATA:  Cough, wheezing.  Smoker.  EXAM: CHEST  2 VIEW  COMPARISON:  None.  FINDINGS: The heart size and mediastinal contours are within normal limits. Both lungs are clear. The visualized skeletal structures are unremarkable.  IMPRESSION: No active cardiopulmonary disease.   Electronically Signed   By: Rolm Baptise M.D.   On: 11/15/2013 14:46       Assessment & Plan:   Problem List Items Addressed This Visit    Diabetes mellitus    Not checking sugars.  Discussed diet and exercise.  Taking her medication.  Did not tolerate increased metformin.  Will change to metformin 500mg  q day and increase januvia to 100mg  q day.  Check and record sugars.  Send in readings.  Get her back in soon to reassess.        Relevant Medications   sitaGLIPtin (JANUVIA) 100 MG tablet   metFORMIN (GLUCOPHAGE) 500 MG tablet   GERD (gastroesophageal reflux disease)    On protonix.  Symptoms controlled.       Hypercholesterolemia    Low cholesterol diet and exercise.  Declines to take cholesterol medication.  Follow.       Hypertension - Primary    She is taking her lisinopril.  Blood pressure doing well.  Same medication regimen.  Follow pressures.  Follow metabolic panel.           Einar Pheasant, MD

## 2014-09-09 NOTE — Progress Notes (Signed)
Pre visit review using our clinic review tool, if applicable. No additional management support is needed unless otherwise documented below in the visit note. 

## 2014-09-11 ENCOUNTER — Encounter: Payer: Self-pay | Admitting: Internal Medicine

## 2014-09-11 NOTE — Assessment & Plan Note (Signed)
On protonix.  Symptoms controlled.   

## 2014-09-11 NOTE — Assessment & Plan Note (Signed)
Low cholesterol diet and exercise.  Declines to take cholesterol medication.  Follow.

## 2014-09-11 NOTE — Assessment & Plan Note (Signed)
She is taking her lisinopril.  Blood pressure doing well.  Same medication regimen.  Follow pressures.  Follow metabolic panel.

## 2014-09-11 NOTE — Assessment & Plan Note (Signed)
Not checking sugars.  Discussed diet and exercise.  Taking her medication.  Did not tolerate increased metformin.  Will change to metformin 500mg  q day and increase januvia to 100mg  q day.  Check and record sugars.  Send in readings.  Get her back in soon to reassess.

## 2014-10-27 ENCOUNTER — Ambulatory Visit (INDEPENDENT_AMBULATORY_CARE_PROVIDER_SITE_OTHER): Payer: PRIVATE HEALTH INSURANCE | Admitting: Internal Medicine

## 2014-10-27 ENCOUNTER — Encounter: Payer: Self-pay | Admitting: Internal Medicine

## 2014-10-27 VITALS — BP 120/60 | HR 62 | Temp 98.4°F | Resp 16 | Ht 62.7 in | Wt 156.5 lb

## 2014-10-27 DIAGNOSIS — E119 Type 2 diabetes mellitus without complications: Secondary | ICD-10-CM

## 2014-10-27 DIAGNOSIS — E7800 Pure hypercholesterolemia, unspecified: Secondary | ICD-10-CM

## 2014-10-27 DIAGNOSIS — I10 Essential (primary) hypertension: Secondary | ICD-10-CM

## 2014-10-27 NOTE — Progress Notes (Signed)
Patient ID: Ann Collins, female   DOB: 12-09-1945, 69 y.o.   MRN: [id number]   Subjective:    Patient ID: Ann Collins, female    DOB: 1946/02/01, 69 y.o.   MRN: [id number]  HPI  Patient here to follow up on her blood pressure, diabetes, and cholesterol.  Sugars reviewed and attached.  AM sugars averaging 130-160 and pm sugars averaging 200-230s.  Taking one metformin and [place] daily.  Has had problems previously with metformin.  Had low sugars with amaryl.  We discussed increasing metformin.  She thinks she will be able to take metformin if she spaces the dose.  No cardiac symptoms with increased activity or exertion.  No sob.  No abdominal pain or cramping.  Bowels stable.  Taking blood pressure medication.  Declines cholesterol medication.  Handling stress.    Past Medical History  Diagnosis Date  . Hypothyroidism   . Hypercholesterolemia   . GERD (gastroesophageal reflux disease)   . Hypertension   . Diabetes mellitus   . Hematuria   . Diverticulosis   . Kidney stones    Past Surgical History  Procedure Laterality Date  . Appendectomy    . Cholecystectomy  2005  . Breast biopsy  1969    right   Family History  Problem Relation Age of Onset  . Brain cancer Mother     history of brain tumor  . Lung disease Father   . Diabetes Brother     x2  . Breast cancer Neg Hx   . Colon cancer Neg Hx    Social History   Social History  . Marital Status: Married    Spouse Name: N/A  . Number of Children: N/A  . Years of Education: N/A   Social History Main Topics  . Smoking status: Current Every Day Smoker  . Smokeless tobacco: Never Used  . Alcohol Use: No  . Drug Use: No  . Sexual Activity: Not Asked   Other Topics Concern  . None   Social History Narrative    Outpatient Encounter Prescriptions as of 10/27/2014  Medication Sig  . levothyroxine (SYNTHROID, LEVOTHROID) 100 MCG tablet TAKE ONE TABLET BY MOUTH ONCE DAILY.  Marland Kitchen lisinopril (PRINIVIL,ZESTRIL) 10 MG  tablet Take 1 tablet (10 mg total) by mouth daily.  . metFORMIN (GLUCOPHAGE) 500 MG tablet Take 1 tablet (500 mg total) by mouth daily.  . pantoprazole (PROTONIX) 40 MG tablet Take 1 tablet (40 mg total) by mouth 2 (two) times daily before a meal.  . sitaGLIPtin (JANUVIA) 100 MG tablet Take 1 tablet (100 mg total) by mouth daily.   No facility-administered encounter medications on file as of 10/27/2014.    Review of Systems  Constitutional: Negative for appetite change and unexpected weight change.  HENT: Negative for congestion and sinus pressure.   Eyes: Negative for pain and visual disturbance.  Respiratory: Negative for cough, chest tightness and shortness of breath.   Cardiovascular: Negative for chest pain, palpitations and leg swelling.  Gastrointestinal: Negative for nausea, vomiting, abdominal pain and diarrhea.  Genitourinary: Negative for dysuria and difficulty urinating.  Skin: Negative for color change and rash.  Neurological: Negative for dizziness, light-headedness and headaches.  Psychiatric/Behavioral: Negative for dysphoric mood and agitation.       Objective:     Blood pressure rechecked by me:  132/62  Physical Exam  Constitutional: She appears well-developed and well-nourished. No distress.  HENT:  Nose: Nose normal.  Mouth/Throat: Oropharynx is clear and moist.  Eyes:  Conjunctivae are normal. Right eye exhibits no discharge. Left eye exhibits no discharge.  Neck: Neck supple. No thyromegaly present.  Cardiovascular: Normal rate and regular rhythm.   Pulmonary/Chest: Breath sounds normal. No respiratory distress. She has no wheezes.  Abdominal: Soft. Bowel sounds are normal. There is no tenderness.  Musculoskeletal: She exhibits no edema or tenderness.  Lymphadenopathy:    She has no cervical adenopathy.  Skin: No rash noted. No erythema.  Psychiatric: She has a normal mood and affect. Her behavior is normal.    BP 120/60 mmHg  Pulse 62  Temp(Src) 98.4  F (36.9 C) (Oral)  Resp 16  Ht 5' 2.7" (1.593 m)  Wt 156 lb 8 oz (70.988 kg)  BMI 27.97 kg/m2  SpO2 97%  LMP 03/10/1991 Wt Readings from Last 3 Encounters:  10/27/14 156 lb 8 oz (70.988 kg)  09/09/14 154 lb 2 oz (69.911 kg)  07/22/14 159 lb (72.122 kg)     Lab Results  Component Value Date   WBC 9.0 11/27/2013   HGB 14.0 11/27/2013   HCT 42 11/27/2013   PLT 215 11/27/2013   GLUCOSE 150* 08/08/2014   CHOL 260* 08/08/2014   TRIG 346.0* 08/08/2014   HDL 35.30* 08/08/2014   LDLDIRECT 146.0 08/08/2014   LDLCALC 147 11/27/2013   ALT 12 08/08/2014   AST 13 08/08/2014   NA 140 08/08/2014   K 4.5 08/08/2014   CL 105 08/08/2014   CREATININE 1.04 08/08/2014   BUN 20 08/08/2014   CO2 25 08/08/2014   TSH 1.20 08/08/2014   HGBA1C 8.4* 08/08/2014   MICROALBUR 0.4 09/20/2013    Dg Chest 2 View  11/15/2013   CLINICAL DATA:  Cough, wheezing.  Smoker.  EXAM: CHEST  2 VIEW  COMPARISON:  None.  FINDINGS: The heart size and mediastinal contours are within normal limits. Both lungs are clear. The visualized skeletal structures are unremarkable.  IMPRESSION: No active cardiopulmonary disease.   Electronically Signed   By: Rolm Baptise M.D.   On: 11/15/2013 14:46       Assessment & Plan:   Problem List Items Addressed This Visit    Diabetes mellitus    Sugars as outlined.  Increase metformin to bid.  Follow sugars.  Send in readings over the next few weeks.  Continue to adjust medication.  If tolerates, will continue to increase metformin.        Hypercholesterolemia    Declines cholesterol medication.  Follow lipid panel.  Low cholesterol diet and exercise.        Hypertension - Primary    She is taking lisinopril.  Blood pressure doing well.  Same medication regimen.  Follow pressures.  Follow metabolic panel.            Einar Pheasant, MD

## 2014-10-27 NOTE — Progress Notes (Signed)
Pre-visit discussion using our clinic review tool. No additional management support is needed unless otherwise documented below in the visit note.  

## 2014-10-28 ENCOUNTER — Encounter: Payer: Self-pay | Admitting: Internal Medicine

## 2014-10-28 NOTE — Assessment & Plan Note (Signed)
Declines cholesterol medication.  Follow lipid panel.  Low cholesterol diet and exercise.   

## 2014-10-28 NOTE — Assessment & Plan Note (Signed)
She is taking lisinopril.  Blood pressure doing well.  Same medication regimen.  Follow pressures.  Follow metabolic panel.

## 2014-10-28 NOTE — Assessment & Plan Note (Signed)
Sugars as outlined.  Increase metformin to bid.  Follow sugars.  Send in readings over the next few weeks.  Continue to adjust medication.  If tolerates, will continue to increase metformin.

## 2014-10-29 ENCOUNTER — Other Ambulatory Visit: Payer: Self-pay | Admitting: *Deleted

## 2014-10-29 MED ORDER — METFORMIN HCL 500 MG PO TABS: 1000.0000 mg | ORAL_TABLET | Freq: Two times a day (BID) | ORAL | Status: DC

## 2014-11-19 LAB — BASIC METABOLIC PANEL
BUN: 14 mg/dL (ref 4–21)
Creatinine: 1 mg/dL (ref 0.5–1.1)
GLUCOSE: 142 mg/dL
Potassium: 4.3 mmol/L (ref 3.4–5.3)
SODIUM: 141 mmol/L (ref 137–147)

## 2014-11-19 LAB — CBC AND DIFFERENTIAL
HEMATOCRIT: 44 % (ref 36–46)
Hemoglobin: 14.4 g/dL (ref 12.0–16.0)
NEUTROS ABS: 4 /uL
PLATELETS: 196 10*3/uL (ref 150–399)
WBC: 9.6 10^3/mL

## 2014-11-19 LAB — LIPID PANEL
Cholesterol: 247 mg/dL — AB (ref 0–200)
HDL: 41 mg/dL (ref 35–70)
LDL CALC: 151 mg/dL
TRIGLYCERIDES: 277 mg/dL — AB (ref 40–160)

## 2014-11-19 LAB — HEPATIC FUNCTION PANEL
ALT: 15 U/L (ref 7–35)
AST: 14 U/L (ref 13–35)
Alkaline Phosphatase: 99 U/L (ref 25–125)
Bilirubin, Total: 0.3 mg/dL

## 2014-11-19 LAB — TSH: TSH: 1.61 u[IU]/mL (ref 0.41–5.90)

## 2014-11-19 LAB — HEMOGLOBIN A1C: Hgb A1c MFr Bld: 7.7 % — AB (ref 4.0–6.0)

## 2014-11-21 ENCOUNTER — Encounter: Payer: Self-pay | Admitting: *Deleted

## 2014-12-01 ENCOUNTER — Other Ambulatory Visit: Payer: Self-pay | Admitting: Internal Medicine

## 2014-12-26 ENCOUNTER — Ambulatory Visit (INDEPENDENT_AMBULATORY_CARE_PROVIDER_SITE_OTHER): Payer: PRIVATE HEALTH INSURANCE | Admitting: Internal Medicine

## 2014-12-26 ENCOUNTER — Encounter: Payer: Self-pay | Admitting: Internal Medicine

## 2014-12-26 VITALS — BP 110/66 | HR 71 | Temp 98.6°F | Resp 18 | Ht 62.7 in | Wt 154.0 lb

## 2014-12-26 DIAGNOSIS — K2100 Gastro-esophageal reflux disease with esophagitis, without bleeding: Secondary | ICD-10-CM

## 2014-12-26 DIAGNOSIS — E039 Hypothyroidism, unspecified: Secondary | ICD-10-CM

## 2014-12-26 DIAGNOSIS — E119 Type 2 diabetes mellitus without complications: Secondary | ICD-10-CM

## 2014-12-26 DIAGNOSIS — I10 Essential (primary) hypertension: Secondary | ICD-10-CM | POA: Diagnosis not present

## 2014-12-26 DIAGNOSIS — R0989 Other specified symptoms and signs involving the circulatory and respiratory systems: Secondary | ICD-10-CM

## 2014-12-26 DIAGNOSIS — B351 Tinea unguium: Secondary | ICD-10-CM

## 2014-12-26 DIAGNOSIS — E7800 Pure hypercholesterolemia, unspecified: Secondary | ICD-10-CM

## 2014-12-26 MED ORDER — CICLOPIROX 8 % EX SOLN: Freq: Every day | CUTANEOUS | Status: DC

## 2014-12-26 NOTE — Progress Notes (Signed)
Pre-visit discussion using our clinic review tool. No additional management support is needed unless otherwise documented below in the visit note.  

## 2014-12-26 NOTE — Progress Notes (Signed)
Patient ID: Ann Collins, female   DOB: January 02, 1946, 69 y.o.   MRN: [id number]   Subjective:    Patient ID: Ann Collins, female    DOB: [birth year], 69 y.o.   MRN: [id number]  HPI  Patient with past history of hypercholesterolemia, diabetes, hypertension, hypothyroidism and GERD.  She comes in today to follow up on these issues.  Taking the metformin bid and tolerating.  Did not bring in any sugar readings.  Feels doing better.  Discussed diet and exercise.  Stays active.  No chest pain or tightness.  No sob.  Discussed smoking.  Discussed the need to quit.  No abdominal pain or cramping.  Bowels stable.  She is concerned about her nails.  Fungus  Present.  Discussed treatment.  She preferred to try [facility].     Past Medical History  Diagnosis Date  . Hypothyroidism   . Hypercholesterolemia   . GERD (gastroesophageal reflux disease)   . Hypertension   . Diabetes mellitus ([place])   . Hematuria   . Diverticulosis   . Kidney stones    Past Surgical History  Procedure Laterality Date  . Appendectomy    . Cholecystectomy  2005  . Breast biopsy  1969    right   Family History  Problem Relation Age of Onset  . Brain cancer Mother     history of brain tumor  . Lung disease Father   . Diabetes Brother     x2  . Breast cancer Neg Hx   . Colon cancer Neg Hx    Social History   Social History  . Marital Status: Married    Spouse Name: N/A  . Number of Children: N/A  . Years of Education: N/A   Social History Main Topics  . Smoking status: Current Every Day Smoker  . Smokeless tobacco: Never Used  . Alcohol Use: No  . Drug Use: No  . Sexual Activity: Not Asked   Other Topics Concern  . None   Social History Narrative    Outpatient Encounter Prescriptions as of 12/26/2014  Medication Sig  . JANUVIA 100 MG tablet take 1 tablet by mouth once daily  . levothyroxine (SYNTHROID, LEVOTHROID) 100 MCG tablet TAKE ONE TABLET BY MOUTH ONCE DAILY.  Marland Kitchen lisinopril  (PRINIVIL,ZESTRIL) 10 MG tablet Take 1 tablet (10 mg total) by mouth daily.  . metFORMIN (GLUCOPHAGE) 500 MG tablet Take 2 tablets (1,000 mg total) by mouth 2 (two) times daily with a meal.  . pantoprazole (PROTONIX) 40 MG tablet Take 1 tablet (40 mg total) by mouth 2 (two) times daily before a meal.  . ciclopirox (PENLAC) 8 % solution Apply topically at bedtime. Apply over nail and surrounding skin. Apply daily over previous coat. After seven (7) days, may remove with alcohol and continue cycle.   No facility-administered encounter medications on file as of 12/26/2014.    Review of Systems  Constitutional: Negative for appetite change and unexpected weight change.  HENT: Negative for congestion and sinus pressure.   Eyes: Negative for pain and discharge.  Respiratory: Negative for cough, chest tightness and shortness of breath.   Cardiovascular: Negative for chest pain, palpitations and leg swelling.  Gastrointestinal: Negative for nausea, vomiting, abdominal pain and diarrhea.  Genitourinary: Negative for dysuria and difficulty urinating.  Musculoskeletal: Negative for back pain and joint swelling.  Skin: Negative for color change and rash.       Nail changes that appear to be c/w fungus.   Neurological: Negative  for dizziness, light-headedness and headaches.  Psychiatric/Behavioral: Negative for dysphoric mood and agitation.       Objective:    Physical Exam  Constitutional: She appears well-developed and well-nourished. No distress.  HENT:  Nose: Nose normal.  Mouth/Throat: Oropharynx is clear and moist.  Eyes: Conjunctivae are normal. Right eye exhibits no discharge. Left eye exhibits no discharge.  Neck: Neck supple. No thyromegaly present.  Cardiovascular: Normal rate and regular rhythm.   Left carotid bruit  Pulmonary/Chest: Breath sounds normal. No respiratory distress. She has no wheezes.  Abdominal: Soft. Bowel sounds are normal. There is no tenderness.    Musculoskeletal: She exhibits no edema or tenderness.  Lymphadenopathy:    She has no cervical adenopathy.  Skin: No rash noted. No erythema.  Nails appear to be c/w fungal infection.   Psychiatric: She has a normal mood and affect. Her behavior is normal.    BP 110/66 mmHg  Pulse 71  Temp(Src) 98.6 F (37 C) (Oral)  Resp 18  Ht 5' 2.7" (1.593 m)  Wt 154 lb (69.854 kg)  BMI 27.53 kg/m2  SpO2 96%  LMP 03/10/1991 Wt Readings from Last 3 Encounters:  12/26/14 154 lb (69.854 kg)  10/27/14 156 lb 8 oz (70.988 kg)  09/09/14 154 lb 2 oz (69.911 kg)     Lab Results  Component Value Date   WBC 9.6 11/19/2014   HGB 14.4 11/19/2014   HCT 44 11/19/2014   PLT 196 11/19/2014   GLUCOSE 150* 08/08/2014   CHOL 247* 11/19/2014   TRIG 277* 11/19/2014   HDL 41 11/19/2014   LDLDIRECT 146.0 08/08/2014   LDLCALC 151 11/19/2014   ALT 15 11/19/2014   AST 14 11/19/2014   NA 141 11/19/2014   K 4.3 11/19/2014   CL 105 08/08/2014   CREATININE 1.0 11/19/2014   BUN 14 11/19/2014   CO2 25 08/08/2014   TSH 1.61 11/19/2014   HGBA1C 7.7* 11/19/2014   MICROALBUR 0.4 09/20/2013       Assessment & Plan:   Problem List Items Addressed This Visit    Diabetes mellitus ([place])    Not checking sugars regularly.  Discussed diet and exercise.  a1c was some better.  Adjusted metformin last visit.  Follow sugars.  Follow met b and a1c.  Diet and exercise.        GERD (gastroesophageal reflux disease)    On protonix.  Symptoms controlled.       Hypercholesterolemia    Low cholesterol diet and exercise.  Follow lipid panel.  Last LDL 151.  Discussed cholesterol medication.  She declines.  Follow.        Hypertension - Primary    Blood pressure under good control.  Continue same medication regimen.  Follow pressures.  Follow metabolic panel.        Hypothyroidism    On thyroid replacement.  Follow tsh.       Left carotid bruit    Needs f/u carotid ultrasound.  Discussed with her today.  She  declines.  Follow.  Aspirin daily.       Nail fungus    Changes on finger nails appear to be c/w nail fungus.  Discussed treatment options.  Preferred [facility].  Follow.        Relevant Medications   ciclopirox (PENLAC) 8 % solution       Einar Pheasant, MD

## 2014-12-28 ENCOUNTER — Encounter: Payer: Self-pay | Admitting: Internal Medicine

## 2014-12-28 DIAGNOSIS — B351 Tinea unguium: Secondary | ICD-10-CM | POA: Insufficient documentation

## 2014-12-28 DIAGNOSIS — R0989 Other specified symptoms and signs involving the circulatory and respiratory systems: Secondary | ICD-10-CM | POA: Insufficient documentation

## 2014-12-28 NOTE — Assessment & Plan Note (Signed)
Low cholesterol diet and exercise.  Follow lipid panel.  Last LDL 151.  Discussed cholesterol medication.  She declines.  Follow.

## 2014-12-28 NOTE — Assessment & Plan Note (Signed)
Changes on finger nails appear to be c/w nail fungus.  Discussed treatment options.  Preferred [facility].  Follow.

## 2014-12-28 NOTE — Assessment & Plan Note (Signed)
Needs f/u carotid ultrasound.  Discussed with her today.  She declines.  Follow.  Aspirin daily.

## 2014-12-28 NOTE — Assessment & Plan Note (Signed)
Blood pressure under good control.  Continue same medication regimen.  Follow pressures.  Follow metabolic panel.   

## 2014-12-28 NOTE — Assessment & Plan Note (Signed)
Not checking sugars regularly.  Discussed diet and exercise.  a1c was some better.  Adjusted metformin last visit.  Follow sugars.  Follow met b and a1c.  Diet and exercise.

## 2014-12-28 NOTE — Assessment & Plan Note (Signed)
On protonix.  Symptoms controlled.   

## 2014-12-28 NOTE — Assessment & Plan Note (Signed)
On thyroid replacement.  Follow tsh.  

## 2015-01-28 ENCOUNTER — Other Ambulatory Visit: Payer: Self-pay | Admitting: Internal Medicine

## 2015-02-11 ENCOUNTER — Other Ambulatory Visit: Payer: Self-pay | Admitting: Internal Medicine

## 2015-02-25 ENCOUNTER — Other Ambulatory Visit: Payer: Self-pay | Admitting: Internal Medicine

## 2015-03-27 ENCOUNTER — Other Ambulatory Visit (INDEPENDENT_AMBULATORY_CARE_PROVIDER_SITE_OTHER): Payer: No Typology Code available for payment source

## 2015-03-27 DIAGNOSIS — E7800 Pure hypercholesterolemia, unspecified: Secondary | ICD-10-CM

## 2015-03-27 DIAGNOSIS — I10 Essential (primary) hypertension: Secondary | ICD-10-CM

## 2015-03-27 DIAGNOSIS — E119 Type 2 diabetes mellitus without complications: Secondary | ICD-10-CM

## 2015-03-27 LAB — BASIC METABOLIC PANEL
BUN: 20 mg/dL (ref 6–23)
CALCIUM: 9.7 mg/dL (ref 8.4–10.5)
CHLORIDE: 105 meq/L (ref 96–112)
CO2: 28 meq/L (ref 19–32)
Creatinine, Ser: 1.08 mg/dL (ref 0.40–1.20)
GFR: 53.41 mL/min — ABNORMAL LOW (ref >60.00)
Glucose, Bld: 189 mg/dL — ABNORMAL HIGH (ref 70–99)
POTASSIUM: 4.8 meq/L (ref 3.5–5.1)
SODIUM: 141 meq/L (ref 135–145)

## 2015-03-27 LAB — HEPATIC FUNCTION PANEL
ALT: 22 U/L (ref 0–35)
AST: 13 U/L (ref 0–37)
Albumin: 4.3 g/dL (ref 3.5–5.2)
Alkaline Phosphatase: 106 U/L (ref 39–117)
BILIRUBIN TOTAL: 0.3 mg/dL (ref 0.2–1.2)
Bilirubin, Direct: 0.1 mg/dL (ref 0.0–0.3)
Total Protein: 7 g/dL (ref 6.0–8.3)

## 2015-03-27 LAB — MICROALBUMIN / CREATININE URINE RATIO
Creatinine,U: 76.1 mg/dL
Microalb Creat Ratio: 0.9 mg/g (ref 0.0–30.0)

## 2015-03-27 LAB — HEMOGLOBIN A1C: Hgb A1c MFr Bld: 8.2 % — ABNORMAL HIGH (ref 4.6–6.5)

## 2015-03-27 LAB — LIPID PANEL
CHOL/HDL RATIO: 5
Cholesterol: 243 mg/dL — ABNORMAL HIGH (ref 0–200)
HDL: 49.8 mg/dL (ref >39.00)
LDL CALC: 170 mg/dL — AB (ref 0–99)
NONHDL: 193.02
Triglycerides: 113 mg/dL (ref 0.0–149.0)
VLDL: 22.6 mg/dL (ref 0.0–40.0)

## 2015-03-27 NOTE — Progress Notes (Signed)
Patient here today for lab draw only. 

## 2015-03-29 ENCOUNTER — Encounter: Payer: Self-pay | Admitting: Internal Medicine

## 2015-03-30 ENCOUNTER — Ambulatory Visit (INDEPENDENT_AMBULATORY_CARE_PROVIDER_SITE_OTHER): Payer: No Typology Code available for payment source | Admitting: Internal Medicine

## 2015-03-30 ENCOUNTER — Encounter: Payer: Self-pay | Admitting: Internal Medicine

## 2015-03-30 VITALS — BP 142/70 | HR 68 | Temp 98.5°F | Resp 18 | Ht 62.7 in | Wt 157.8 lb

## 2015-03-30 DIAGNOSIS — I10 Essential (primary) hypertension: Secondary | ICD-10-CM

## 2015-03-30 DIAGNOSIS — K2100 Gastro-esophageal reflux disease with esophagitis, without bleeding: Secondary | ICD-10-CM

## 2015-03-30 DIAGNOSIS — E7800 Pure hypercholesterolemia, unspecified: Secondary | ICD-10-CM

## 2015-03-30 DIAGNOSIS — E039 Hypothyroidism, unspecified: Secondary | ICD-10-CM | POA: Diagnosis not present

## 2015-03-30 DIAGNOSIS — J392 Other diseases of pharynx: Secondary | ICD-10-CM

## 2015-03-30 DIAGNOSIS — E119 Type 2 diabetes mellitus without complications: Secondary | ICD-10-CM

## 2015-03-30 MED ORDER — METFORMIN HCL ER 500 MG PO TB24: 500.0000 mg | ORAL_TABLET | Freq: Every day | ORAL | Status: DC

## 2015-03-30 NOTE — Patient Instructions (Signed)
nasacort nasal spray - 2 sprays each nostril one time per day  Saline nasal spray - flush nose at least 2-3x/day  Zantac (ranitidine) 150mg  before bed

## 2015-03-30 NOTE — Progress Notes (Signed)
Patient ID: Ann Collins, female   DOB: October 29, 1945, 70 y.o.   MRN: [id number]   Subjective:    Patient ID: Ann Collins, female    DOB: [birth year], 70 y.o.   MRN: [id number]  HPI  Patient with past history of hypercholesterolemia, GERD, diabetes and hypertension.  She comes in today to follow up on these issues.  She is not taking the metformin as directed.  We discussed the need to take her medications on a regular basis.  She does not tolerate increased dose of metformin.  Discussed other treatment options.  She declines injections.  Desires not to start any new medication.  She does report that her throat feels irritated.  Some increased drainage.  No abdominal pain or cramping. No chest pain or tightness.  Breathing stable.  No abdominal pain or cramping.     Past Medical History  Diagnosis Date  . Hypothyroidism   . Hypercholesterolemia   . GERD (gastroesophageal reflux disease)   . Hypertension   . Diabetes mellitus ([place])   . Hematuria   . Diverticulosis   . Kidney stones    Past Surgical History  Procedure Laterality Date  . Appendectomy    . Cholecystectomy  2005  . Breast biopsy  1969    right   Family History  Problem Relation Age of Onset  . Brain cancer Mother     history of brain tumor  . Lung disease Father   . Diabetes Brother     x2  . Breast cancer Neg Hx   . Colon cancer Neg Hx    Social History   Social History  . Marital Status: Married    Spouse Name: N/A  . Number of Children: N/A  . Years of Education: N/A   Social History Main Topics  . Smoking status: Current Every Day Smoker  . Smokeless tobacco: Never Used  . Alcohol Use: No  . Drug Use: No  . Sexual Activity: Not Asked   Other Topics Concern  . None   Social History Narrative    Outpatient Encounter Prescriptions as of 03/30/2015  Medication Sig  . ciclopirox (PENLAC) 8 % solution Apply topically at bedtime. Apply over nail and surrounding skin. Apply daily over previous  coat. After seven (7) days, may remove with alcohol and continue cycle.  Marland Kitchen JANUVIA 100 MG tablet take 1 tablet by mouth once daily  . levothyroxine (SYNTHROID, LEVOTHROID) 100 MCG tablet TAKE ONE TABLET BY MOUTH ONCE DAILY.  Marland Kitchen lisinopril (PRINIVIL,ZESTRIL) 10 MG tablet Take 1 tablet (10 mg total) by mouth daily.  . pantoprazole (PROTONIX) 40 MG tablet take 1 tablet by mouth twice a day before meals  . [DISCONTINUED] metFORMIN (GLUCOPHAGE) 500 MG tablet take 2 tablets by mouth twice a day with A MEAL  . metFORMIN (GLUCOPHAGE XR) 500 MG 24 hr tablet Take 1 tablet (500 mg total) by mouth daily with breakfast.   No facility-administered encounter medications on file as of 03/30/2015.    Review of Systems  Constitutional: Negative for appetite change and unexpected weight change.  HENT: Positive for postnasal drip and sore throat. Negative for congestion.   Respiratory: Negative for cough, chest tightness and shortness of breath.   Cardiovascular: Negative for chest pain, palpitations and leg swelling.  Gastrointestinal: Negative for nausea, vomiting, abdominal pain and diarrhea.  Genitourinary: Negative for dysuria and difficulty urinating.  Musculoskeletal: Negative for back pain and joint swelling.  Skin: Negative for color change and rash.  Neurological: Negative  for dizziness, light-headedness and headaches.  Psychiatric/Behavioral: Negative for dysphoric mood and agitation.       Objective:    Physical Exam  Constitutional: She appears well-developed and well-nourished. No distress.  HENT:  Nose: Nose normal.  Mouth/Throat: Oropharynx is clear and moist.  Eyes: Conjunctivae are normal. Right eye exhibits no discharge. Left eye exhibits no discharge.  Neck: Neck supple. No thyromegaly present.  Cardiovascular: Normal rate and regular rhythm.   Pulmonary/Chest: Breath sounds normal. No respiratory distress. She has no wheezes.  Abdominal: Soft. Bowel sounds are normal. There is no  tenderness.  Musculoskeletal: She exhibits no edema or tenderness.  Lymphadenopathy:    She has no cervical adenopathy.  Skin: No rash noted. No erythema.  Psychiatric: She has a normal mood and affect. Her behavior is normal.    BP 142/70 mmHg  Pulse 68  Temp(Src) 98.5 F (36.9 C) (Oral)  Resp 18  Ht 5' 2.7" (1.593 m)  Wt 157 lb 12 oz (71.555 kg)  BMI 28.20 kg/m2  SpO2 97%  LMP 03/10/1991 Wt Readings from Last 3 Encounters:  03/30/15 157 lb 12 oz (71.555 kg)  12/26/14 154 lb (69.854 kg)  10/27/14 156 lb 8 oz (70.988 kg)     Lab Results  Component Value Date   WBC 9.6 11/19/2014   HGB 14.4 11/19/2014   HCT 44 11/19/2014   PLT 196 11/19/2014   GLUCOSE 189* 03/27/2015   CHOL 243* 03/27/2015   TRIG 113.0 03/27/2015   HDL 49.80 03/27/2015   LDLDIRECT 146.0 08/08/2014   LDLCALC 170* 03/27/2015   ALT 22 03/27/2015   AST 13 03/27/2015   NA 141 03/27/2015   K 4.8 03/27/2015   CL 105 03/27/2015   CREATININE 1.08 03/27/2015   BUN 20 03/27/2015   CO2 28 03/27/2015   TSH 1.61 11/19/2014   HGBA1C 8.2* 03/27/2015   MICROALBUR <0.7 03/27/2015       Assessment & Plan:   Problem List Items Addressed This Visit    Diabetes mellitus ([place])    Low carb diet and exercise.  Not taking her medication as directed.  Will change to metformin XR.  See if tolerates the extended release form and then can increase dose.  Follow met b and a1c.  She desires not to start a new medication.        Relevant Medications   metFORMIN (GLUCOPHAGE XR) 500 MG 24 hr tablet   GERD (gastroesophageal reflux disease)    Some throat irritation.  On protonix bid.  Add zantac at night.  Follow.  Get her back in soon to reassess.        Hypercholesterolemia    Low cholesterol diet and exercise.  Follow lipid panel.  LDL elevated.  Discussed starting cholesterol medication.  She declines.        Hypertension - Primary    Blood pressure elevated today.  Not taking the lisinopril.  Will restart.   Discussed importance of taking her medication regularly.        Hypothyroidism    On thyroid replacement.  Follow tsh.        Throat irritation    Continue protonix and add zantac as directed.  Saline nasal spray and nasacort nasal spray as directed.            Einar Pheasant, MD

## 2015-03-30 NOTE — Progress Notes (Signed)
Pre-visit discussion using our clinic review tool. No additional management support is needed unless otherwise documented below in the visit note.  

## 2015-04-06 ENCOUNTER — Encounter: Payer: Self-pay | Admitting: Internal Medicine

## 2015-04-06 DIAGNOSIS — J392 Other diseases of pharynx: Secondary | ICD-10-CM | POA: Insufficient documentation

## 2015-04-06 NOTE — Assessment & Plan Note (Signed)
Continue protonix and add zantac as directed.  Saline nasal spray and nasacort nasal spray as directed.

## 2015-04-06 NOTE — Assessment & Plan Note (Addendum)
Low cholesterol diet and exercise.  Follow lipid panel.  LDL elevated.  Discussed starting cholesterol medication.  She declines.

## 2015-04-06 NOTE — Assessment & Plan Note (Addendum)
Low carb diet and exercise.  Not taking her medication as directed.  Will change to metformin XR.  See if tolerates the extended release form and then can increase dose.  Follow met b and a1c.  She desires not to start a new medication.

## 2015-04-06 NOTE — Assessment & Plan Note (Signed)
On thyroid replacement.  Follow tsh.  

## 2015-04-06 NOTE — Assessment & Plan Note (Addendum)
Some throat irritation.  On protonix bid.  Add zantac at night.  Follow.  Get her back in soon to reassess.

## 2015-04-06 NOTE — Assessment & Plan Note (Signed)
Blood pressure elevated today.  Not taking the lisinopril.  Will restart.  Discussed importance of taking her medication regularly.

## 2015-05-17 ENCOUNTER — Other Ambulatory Visit: Payer: Self-pay | Admitting: Internal Medicine

## 2015-06-19 ENCOUNTER — Encounter: Payer: Self-pay | Admitting: Internal Medicine

## 2015-06-19 ENCOUNTER — Ambulatory Visit (INDEPENDENT_AMBULATORY_CARE_PROVIDER_SITE_OTHER): Payer: No Typology Code available for payment source | Admitting: Internal Medicine

## 2015-06-19 VITALS — BP 128/70 | HR 73 | Temp 98.2°F | Resp 18 | Ht 62.7 in | Wt 148.2 lb

## 2015-06-19 DIAGNOSIS — R0989 Other specified symptoms and signs involving the circulatory and respiratory systems: Secondary | ICD-10-CM

## 2015-06-19 DIAGNOSIS — E039 Hypothyroidism, unspecified: Secondary | ICD-10-CM | POA: Diagnosis not present

## 2015-06-19 DIAGNOSIS — E7800 Pure hypercholesterolemia, unspecified: Secondary | ICD-10-CM | POA: Diagnosis not present

## 2015-06-19 DIAGNOSIS — I10 Essential (primary) hypertension: Secondary | ICD-10-CM | POA: Diagnosis not present

## 2015-06-19 DIAGNOSIS — E119 Type 2 diabetes mellitus without complications: Secondary | ICD-10-CM | POA: Diagnosis not present

## 2015-06-19 DIAGNOSIS — K2100 Gastro-esophageal reflux disease with esophagitis, without bleeding: Secondary | ICD-10-CM

## 2015-06-19 LAB — LIPID PANEL
CHOLESTEROL: 237 mg/dL — AB (ref 0–200)
HDL: 33.7 mg/dL — ABNORMAL LOW (ref >39.00)
LDL CALC: 170 mg/dL — AB (ref 0–99)
NONHDL: 203
Total CHOL/HDL Ratio: 7
Triglycerides: 164 mg/dL — ABNORMAL HIGH (ref 0.0–149.0)
VLDL: 32.8 mg/dL (ref 0.0–40.0)

## 2015-06-19 LAB — HEPATIC FUNCTION PANEL
ALT: 12 U/L (ref 0–35)
AST: 11 U/L (ref 0–37)
Albumin: 4.1 g/dL (ref 3.5–5.2)
Alkaline Phosphatase: 90 U/L (ref 39–117)
BILIRUBIN DIRECT: 0 mg/dL (ref 0.0–0.3)
BILIRUBIN TOTAL: 0.3 mg/dL (ref 0.2–1.2)
Total Protein: 7 g/dL (ref 6.0–8.3)

## 2015-06-19 LAB — BASIC METABOLIC PANEL
BUN: 19 mg/dL (ref 6–23)
CALCIUM: 9.7 mg/dL (ref 8.4–10.5)
CHLORIDE: 103 meq/L (ref 96–112)
CO2: 26 mEq/L (ref 19–32)
Creatinine, Ser: 0.98 mg/dL (ref 0.40–1.20)
GFR: 59.7 mL/min — ABNORMAL LOW (ref >60.00)
GLUCOSE: 159 mg/dL — AB (ref 70–99)
Potassium: 4.4 mEq/L (ref 3.5–5.1)
SODIUM: 138 meq/L (ref 135–145)

## 2015-06-19 LAB — TSH: TSH: 0.54 u[IU]/mL (ref 0.35–4.50)

## 2015-06-19 LAB — HEMOGLOBIN A1C: HEMOGLOBIN A1C: 7.9 % — AB (ref 4.6–6.5)

## 2015-06-19 MED ORDER — LISINOPRIL 10 MG PO TABS: 10.0000 mg | ORAL_TABLET | Freq: Every day | ORAL | Status: DC

## 2015-06-19 NOTE — Progress Notes (Signed)
Pre-visit discussion using our clinic review tool. No additional management support is needed unless otherwise documented below in the visit note.  

## 2015-06-19 NOTE — Assessment & Plan Note (Signed)
Blood pressure as outlined.  She is not taking her medication on a regular basis.  Discussed importance of taking her medication daily.  Follow pressures.  Check metabolic panel.

## 2015-06-19 NOTE — Progress Notes (Signed)
Patient ID: Ann Collins, female   DOB: [birth year], 70 y.o.   MRN: [id number]   Subjective:    Patient ID: Ann Collins, female    DOB: October 12, 1945, 70 y.o.   MRN: [id number]  HPI  Patient here for a scheduled follow up.  Increased stress with family medical issues.  She feels she is handling things relatively well.  Does not feel needs any further intervention.  Does report some increased nasal congestion, chest congestion and cough.  Started a few weeks ago.  Better now.  Still with some nasal congestion and drainage.  No deep chest congestion. No wheezing now.  Some cough persist, but better.  No sob.  No chest pain.  No acid reflux.  Controlled on current regimen.  No abdominal pain or cramping.  Bowels stable.  Still smoking.  Discussed the need to quit.  Desires not to quit at this time.    Past Medical History  Diagnosis Date  . Hypothyroidism   . Hypercholesterolemia   . GERD (gastroesophageal reflux disease)   . Hypertension   . Diabetes mellitus ([place])   . Hematuria   . Diverticulosis   . Kidney stones    Past Surgical History  Procedure Laterality Date  . Appendectomy    . Cholecystectomy  2005  . Breast biopsy  1969    right   Family History  Problem Relation Age of Onset  . Brain cancer Mother     history of brain tumor  . Lung disease Father   . Diabetes Brother     x2  . Breast cancer Neg Hx   . Colon cancer Neg Hx    Social History   Social History  . Marital Status: Married    Spouse Name: N/A  . Number of Children: N/A  . Years of Education: N/A   Social History Main Topics  . Smoking status: Current Every Day Smoker  . Smokeless tobacco: Never Used  . Alcohol Use: No  . Drug Use: No  . Sexual Activity: Not Asked   Other Topics Concern  . None   Social History Narrative    Outpatient Encounter Prescriptions as of 06/19/2015  Medication Sig  . ciclopirox (PENLAC) 8 % solution Apply topically at bedtime. Apply over nail and surrounding  skin. Apply daily over previous coat. After seven (7) days, may remove with alcohol and continue cycle.  Marland Kitchen JANUVIA 100 MG tablet take 1 tablet by mouth once daily  . levothyroxine (SYNTHROID, LEVOTHROID) 100 MCG tablet TAKE ONE TABLET BY MOUTH ONCE DAILY.  Marland Kitchen lisinopril (PRINIVIL,ZESTRIL) 10 MG tablet Take 1 tablet (10 mg total) by mouth daily.  . metFORMIN (GLUCOPHAGE-XR) 500 MG 24 hr tablet take 1 tablet by mouth once daily with BREAKFAST  . pantoprazole (PROTONIX) 40 MG tablet take 1 tablet by mouth twice a day before meals  . [DISCONTINUED] lisinopril (PRINIVIL,ZESTRIL) 10 MG tablet Take 1 tablet (10 mg total) by mouth daily.   No facility-administered encounter medications on file as of 06/19/2015.    Review of Systems  Constitutional: Negative for appetite change and unexpected weight change.  HENT: Positive for congestion. Negative for sinus pressure.   Respiratory: Positive for cough. Negative for chest tightness and shortness of breath.   Cardiovascular: Negative for chest pain, palpitations and leg swelling.  Gastrointestinal: Negative for nausea, vomiting, abdominal pain and diarrhea.  Genitourinary: Negative for dysuria and difficulty urinating.  Musculoskeletal: Negative for myalgias and joint swelling.  Skin: Negative for color change  and rash.  Neurological: Negative for dizziness, light-headedness and headaches.  Psychiatric/Behavioral: Negative for dysphoric mood and agitation.       Objective:     Blood pressure rechecked by me:  138-140/72  Physical Exam  Constitutional: She appears well-developed and well-nourished. No distress.  HENT:  Nose: Nose normal.  Mouth/Throat: Oropharynx is clear and moist.  Neck: Neck supple. No thyromegaly present.  Cardiovascular: Normal rate and regular rhythm.   DP pulses palpable and equal bilaterally.   Pulmonary/Chest: Breath sounds normal. No respiratory distress. She has no wheezes.  Abdominal: Soft. Bowel sounds are normal.  There is no tenderness.  Musculoskeletal: She exhibits no edema or tenderness.  Lymphadenopathy:    She has no cervical adenopathy.  Skin: No rash noted. No erythema.  Psychiatric: She has a normal mood and affect. Her behavior is normal.    BP 128/70 mmHg  Pulse 73  Temp(Src) 98.2 F (36.8 C) (Oral)  Resp 18  Ht 5' 2.7" (1.593 m)  Wt 148 lb 4 oz (67.246 kg)  BMI 26.50 kg/m2  SpO2 96%  LMP 03/10/1991 Wt Readings from Last 3 Encounters:  06/19/15 148 lb 4 oz (67.246 kg)  03/30/15 157 lb 12 oz (71.555 kg)  12/26/14 154 lb (69.854 kg)     Lab Results  Component Value Date   WBC 9.6 11/19/2014   HGB 14.4 11/19/2014   HCT 44 11/19/2014   PLT 196 11/19/2014   GLUCOSE 159* 06/19/2015   CHOL 237* 06/19/2015   TRIG 164.0* 06/19/2015   HDL 33.70* 06/19/2015   LDLDIRECT 146.0 08/08/2014   LDLCALC 170* 06/19/2015   ALT 12 06/19/2015   AST 11 06/19/2015   NA 138 06/19/2015   K 4.4 06/19/2015   CL 103 06/19/2015   CREATININE 0.98 06/19/2015   BUN 19 06/19/2015   CO2 26 06/19/2015   TSH 0.54 06/19/2015   HGBA1C 7.9* 06/19/2015   MICROALBUR <0.7 03/27/2015        Assessment & Plan:   Problem List Items Addressed This Visit    Diabetes mellitus ([place])    She is tolerating the extended release metformin.  Taking daily now.  Brought in no recorded sugar readings.  Low carb diet.  Exercise.  Continue metformin and januvia.  Follow.        Relevant Medications   lisinopril (PRINIVIL,ZESTRIL) 10 MG tablet   Other Relevant Orders   Hemoglobin A1c (Completed)   GERD (gastroesophageal reflux disease)    Taking protonix and zantac.  Symptoms controlled.       Hypercholesterolemia    Discussed cholesterol levels with her.  Elevated.  Discussed the need to lower cholesterol levels.  She declines cholesterol medication.  Continue diet and exercise.        Relevant Medications   lisinopril (PRINIVIL,ZESTRIL) 10 MG tablet   Other Relevant Orders   Lipid panel (Completed)    Hepatic function panel (Completed)   Hypertension    Blood pressure as outlined.  She is not taking her medication on a regular basis.  Discussed importance of taking her medication daily.  Follow pressures.  Check metabolic panel.       Relevant Medications   lisinopril (PRINIVIL,ZESTRIL) 10 MG tablet   Other Relevant Orders   Basic metabolic panel (Completed)   Hypothyroidism - Primary    On thyroid replacement.  Follow tsh.       Relevant Orders   TSH (Completed)   Left carotid bruit    Has declined f/u carotid  ultrasound.  Continue daily aspirin.           Einar Pheasant, MD

## 2015-06-21 ENCOUNTER — Encounter: Payer: Self-pay | Admitting: Internal Medicine

## 2015-06-21 NOTE — Assessment & Plan Note (Signed)
Has declined f/u carotid ultrasound.  Continue daily aspirin.

## 2015-06-21 NOTE — Assessment & Plan Note (Signed)
She is tolerating the extended release metformin.  Taking daily now.  Brought in no recorded sugar readings.  Low carb diet.  Exercise.  Continue metformin and januvia.  Follow.

## 2015-06-21 NOTE — Assessment & Plan Note (Signed)
Discussed cholesterol levels with her.  Elevated.  Discussed the need to lower cholesterol levels.  She declines cholesterol medication.  Continue diet and exercise.

## 2015-06-21 NOTE — Assessment & Plan Note (Signed)
On thyroid replacement.  Follow tsh.  

## 2015-06-21 NOTE — Assessment & Plan Note (Signed)
Taking protonix and zantac.  Symptoms controlled.

## 2015-06-22 ENCOUNTER — Telehealth: Payer: Self-pay | Admitting: *Deleted

## 2015-06-22 NOTE — Telephone Encounter (Signed)
Left a voicemail message for patient letting them know their doctor has referred them to have a low dose lung cancer screening scan. Requested they return my call to initiate the screening process.

## 2015-06-22 NOTE — Telephone Encounter (Signed)
Received referral for initial lung cancer screening scan. Contacted patient and obtained smoking history, current smoker with 50 pack year history, as well as answering questions related to screening process. Patient is tentatively scheduled for shared decision making visit and CT scan on 06/26/15, pending insurance approval from business office. Patient reports no signs or symptoms of lung cancer and to the best of their knowledge is able to have curative treatment for lung cancer.

## 2015-06-23 ENCOUNTER — Other Ambulatory Visit: Payer: Self-pay | Admitting: *Deleted

## 2015-06-23 MED ORDER — METFORMIN HCL ER 500 MG PO TB24: ORAL_TABLET | ORAL | Status: DC

## 2015-06-23 NOTE — Telephone Encounter (Signed)
Sent in new directions for Metformin

## 2015-06-25 ENCOUNTER — Other Ambulatory Visit: Payer: Self-pay | Admitting: Family Medicine

## 2015-06-25 ENCOUNTER — Encounter: Payer: Self-pay | Admitting: Family Medicine

## 2015-06-25 DIAGNOSIS — Z87891 Personal history of nicotine dependence: Secondary | ICD-10-CM | POA: Insufficient documentation

## 2015-06-26 ENCOUNTER — Inpatient Hospital Stay: Payer: No Typology Code available for payment source | Attending: Family Medicine | Admitting: Family Medicine

## 2015-06-26 ENCOUNTER — Ambulatory Visit
Admission: RE | Admit: 2015-06-26 | Discharge: 2015-06-26 | Disposition: A | Discharge status: Home / Self Care | Payer: No Typology Code available for payment source | Source: Ambulatory Visit | Attending: Family Medicine | Admitting: Family Medicine

## 2015-06-26 ENCOUNTER — Encounter: Payer: Self-pay | Admitting: Family Medicine

## 2015-06-26 DIAGNOSIS — Z87891 Personal history of nicotine dependence: Secondary | ICD-10-CM | POA: Insufficient documentation

## 2015-06-26 DIAGNOSIS — I700 Atherosclerosis of aorta: Secondary | ICD-10-CM | POA: Insufficient documentation

## 2015-06-26 DIAGNOSIS — F17210 Nicotine dependence, cigarettes, uncomplicated: Secondary | ICD-10-CM | POA: Diagnosis not present

## 2015-06-26 DIAGNOSIS — Z122 Encounter for screening for malignant neoplasm of respiratory organs: Secondary | ICD-10-CM

## 2015-06-26 NOTE — Progress Notes (Signed)
In accordance with CMS guidelines, patient has meet eligibility criteria including age, absence of signs or symptoms of lung cancer, the specific calculation of cigarette smoking pack-years was 50 years and is a current smoker.   A shared decision-making session was conducted prior to the performance of CT scan. This includes one or more decision aids, includes benefits and harms of screening, follow-up diagnostic testing, over-diagnosis, false positive rate, and total radiation exposure.  Counseling on the importance of adherence to annual lung cancer LDCT screening, impact of co-morbidities, and ability or willingness to undergo diagnosis and treatment is imperative for compliance of the program.  Counseling on the importance of continued smoking cessation for former smokers; the importance of smoking cessation for current smokers and information about tobacco cessation interventions have been given to patient including the [facility], [address], as well as Cancer Center specific smoking cessation programs.  Written order for lung cancer screening with LDCT has been given to the patient and any and all questions have been answered to the best of my abilities.   Yearly follow up will be scheduled by Shawn Perkins, Thoracic Navigator.   

## 2015-06-30 ENCOUNTER — Encounter: Payer: Self-pay | Admitting: Internal Medicine

## 2015-06-30 ENCOUNTER — Telehealth: Payer: Self-pay | Admitting: *Deleted

## 2015-06-30 DIAGNOSIS — I700 Atherosclerosis of aorta: Secondary | ICD-10-CM | POA: Insufficient documentation

## 2015-06-30 NOTE — Telephone Encounter (Signed)
Notified patient of LDCT lung cancer screening results with recommendation for 12 month follow up imaging. Also notified of incidental finding noted below. Patient verbalizes understanding.   IMPRESSION: 1. Lung-RADS Category 2, benign appearance or behavior. Continue annual screening with low-dose chest CT without contrast in 12 months 2. Aortic atherosclerosis

## 2015-07-12 ENCOUNTER — Other Ambulatory Visit: Payer: Self-pay | Admitting: Internal Medicine

## 2015-07-23 ENCOUNTER — Other Ambulatory Visit: Payer: Self-pay | Admitting: Internal Medicine

## 2015-09-21 ENCOUNTER — Ambulatory Visit (INDEPENDENT_AMBULATORY_CARE_PROVIDER_SITE_OTHER): Payer: No Typology Code available for payment source | Admitting: Internal Medicine

## 2015-09-21 ENCOUNTER — Encounter: Payer: Self-pay | Admitting: Internal Medicine

## 2015-09-21 VITALS — BP 138/70 | HR 74 | Temp 98.6°F | Resp 18 | Ht 62.7 in | Wt 150.2 lb

## 2015-09-21 DIAGNOSIS — E039 Hypothyroidism, unspecified: Secondary | ICD-10-CM

## 2015-09-21 DIAGNOSIS — I700 Atherosclerosis of aorta: Secondary | ICD-10-CM

## 2015-09-21 DIAGNOSIS — I10 Essential (primary) hypertension: Secondary | ICD-10-CM

## 2015-09-21 DIAGNOSIS — Z0000 Encounter for general adult medical examination without abnormal findings: Secondary | ICD-10-CM

## 2015-09-21 DIAGNOSIS — R0989 Other specified symptoms and signs involving the circulatory and respiratory systems: Secondary | ICD-10-CM

## 2015-09-21 DIAGNOSIS — E119 Type 2 diabetes mellitus without complications: Secondary | ICD-10-CM | POA: Diagnosis not present

## 2015-09-21 DIAGNOSIS — E7800 Pure hypercholesterolemia, unspecified: Secondary | ICD-10-CM

## 2015-09-21 DIAGNOSIS — K2100 Gastro-esophageal reflux disease with esophagitis, without bleeding: Secondary | ICD-10-CM

## 2015-09-21 MED ORDER — LEVOTHYROXINE SODIUM 100 MCG PO TABS: ORAL_TABLET | ORAL | 1 refills | Status: DC

## 2015-09-21 NOTE — Progress Notes (Signed)
Pre-visit discussion using our clinic review tool. No additional management support is needed unless otherwise documented below in the visit note.  

## 2015-09-21 NOTE — Progress Notes (Signed)
Patient ID: Ann Collins, female   DOB: [birth year], 70 y.o.   MRN: [id number]   Subjective:    Patient ID: Ann Collins, female    DOB: 1945-04-05, 70 y.o.   MRN: [id number]  HPI  Patient here for her physical exam.  She states she is doing relatively well.  Still with increased stress with her son's issues.  She feels she is handling things relatively well.  No chest pain.  No sob.  No acid reflux.  No abdominal pain or cramping.  Bowels stable.  Brought in no recorded sugar readings.  She is tolerating the metformin now.  Does not feel can increase the dose. Discussed diet and exercise.     Past Medical History:  Diagnosis Date  . Diabetes mellitus ([place])   . Diverticulosis   . GERD (gastroesophageal reflux disease)   . Hematuria   . Hypercholesterolemia   . Hypertension   . Hypothyroidism   . Kidney stones    Past Surgical History:  Procedure Laterality Date  . APPENDECTOMY    . BREAST BIOPSY  1969   right  . CHOLECYSTECTOMY  2005   Family History  Problem Relation Age of Onset  . Brain cancer Mother     history of brain tumor  . Lung disease Father   . Diabetes Brother     x2  . Breast cancer Neg Hx   . Colon cancer Neg Hx    Social History   Social History  . Marital status: Married    Spouse name: N/A  . Number of children: N/A  . Years of education: N/A   Social History Main Topics  . Smoking status: Current Every Day Smoker    Packs/day: 1.00    Years: 50.00  . Smokeless tobacco: Never Used  . Alcohol use No  . Drug use: No  . Sexual activity: Not Asked   Other Topics Concern  . None   Social History Narrative  . None    Outpatient Encounter Prescriptions as of 09/21/2015  Medication Sig  . ciclopirox (PENLAC) 8 % solution Apply topically at bedtime. Apply over nail and surrounding skin. Apply daily over previous coat. After seven (7) days, may remove with alcohol and continue cycle.  Marland Kitchen JANUVIA 100 MG tablet take 1 tablet by mouth once  daily  . levothyroxine (SYNTHROID, LEVOTHROID) 100 MCG tablet TAKE ONE TABLET BY MOUTH ONCE DAILY.  Marland Kitchen lisinopril (PRINIVIL,ZESTRIL) 10 MG tablet Take 1 tablet (10 mg total) by mouth daily.  . metFORMIN (GLUCOPHAGE-XR) 500 MG 24 hr tablet take 1 tablet by mouth twice a day with meal  . pantoprazole (PROTONIX) 40 MG tablet take 1 tablet by mouth twice a day before meals  . [DISCONTINUED] levothyroxine (SYNTHROID, LEVOTHROID) 100 MCG tablet TAKE ONE TABLET BY MOUTH ONCE DAILY.  . [DISCONTINUED] levothyroxine (SYNTHROID, LEVOTHROID) 100 MCG tablet TAKE ONE TABLET BY MOUTH ONCE DAILY---SCHEDULE AN OFFICE VISIT TO RECEIVE FURTHER REFILLS   No facility-administered encounter medications on file as of 09/21/2015.     Review of Systems  Constitutional: Negative for appetite change and unexpected weight change.  HENT: Negative for congestion and sinus pressure.   Eyes: Negative for pain and visual disturbance.  Respiratory: Negative for cough, chest tightness and shortness of breath.   Cardiovascular: Negative for chest pain, palpitations and leg swelling.  Gastrointestinal: Negative for abdominal pain, diarrhea, nausea and vomiting.  Genitourinary: Negative for difficulty urinating and dysuria.  Musculoskeletal: Negative for joint swelling and myalgias.  Skin: Negative for color change and rash.  Neurological: Negative for dizziness, light-headedness and headaches.  Hematological: Negative for adenopathy. Does not bruise/bleed easily.  Psychiatric/Behavioral: Negative for agitation and dysphoric mood.       Objective:    Physical Exam  Constitutional: She appears well-developed and well-nourished. No distress.  HENT:  Nose: Nose normal.  Mouth/Throat: Oropharynx is clear and moist.  Neck: Neck supple. No thyromegaly present.  Cardiovascular: Normal rate and regular rhythm.   Pulmonary/Chest: Breath sounds normal. No respiratory distress. She has no wheezes.  Breast exam - no nipple  discharge.  Inverted nipple present. Unchanged.  Could not appreciate any distinct nodule or axillary adenopathy.    Abdominal: Soft. Bowel sounds are normal. There is no tenderness.  Musculoskeletal: She exhibits no edema or tenderness.  Lymphadenopathy:    She has no cervical adenopathy.  Skin: No rash noted. No erythema.  Psychiatric: She has a normal mood and affect. Her behavior is normal.    BP 138/70   Pulse 74   Temp 98.6 F (37 C) (Oral)   Resp 18   Ht 5' 2.7" (1.593 m)   Wt 150 lb 4 oz (68.2 kg)   LMP 03/10/1991   SpO2 96%   BMI 26.87 kg/m  Wt Readings from Last 3 Encounters:  09/21/15 150 lb 4 oz (68.2 kg)  06/26/15 150 lb (68 kg)  06/19/15 148 lb 4 oz (67.2 kg)     Lab Results  Component Value Date   WBC 9.6 11/19/2014   HGB 14.4 11/19/2014   HCT 44 11/19/2014   PLT 196 11/19/2014   GLUCOSE 159 (H) 06/19/2015   CHOL 237 (H) 06/19/2015   TRIG 164.0 (H) 06/19/2015   HDL 33.70 (L) 06/19/2015   LDLDIRECT 146.0 08/08/2014   LDLCALC 170 (H) 06/19/2015   ALT 12 06/19/2015   AST 11 06/19/2015   NA 138 06/19/2015   K 4.4 06/19/2015   CL 103 06/19/2015   CREATININE 0.98 06/19/2015   BUN 19 06/19/2015   CO2 26 06/19/2015   TSH 0.54 06/19/2015   HGBA1C 7.9 (H) 06/19/2015   MICROALBUR <0.7 03/27/2015    Ct Chest Lung Ca Screen Low Dose W/o Cm  Result Date: 06/26/2015 CLINICAL DATA:  Lung cancer screening. Fifty pack-year history. Asymptomatic, current smoker. EXAM: CT CHEST WITHOUT CONTRAST LOW-DOSE FOR LUNG CANCER SCREENING TECHNIQUE: Multidetector CT imaging of the chest was performed following the standard protocol without IV contrast. COMPARISON:  None. FINDINGS: Mediastinum/Nodes: The heart size appears within normal limits. There is no pericardial effusion. Aortic atherosclerosis noted. No enlarged mediastinal or hilar lymph nodes. No axillary or supraclavicular adenopathy. Lungs/Pleura: There are a few small nodules identified. The largest is in the left  upper lobe and has an equivalent diameter of 3.4 mm, image number 101 of series 3. Upper abdomen: Right adrenal adenoma is identified measuring 1.3 x 3.2 cm, image 50 of series 2. Intermediate attenuating structure arising from the upper pole of left kidney measures 2.6 cm. This is incompletely characterized without IV contrast. Previous cholecystectomy. 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 No aggressive lytic or sclerotic bone lesion identified. IMPRESSION: 1. Lung-RADS Category 2, benign appearance or behavior. Continue annual screening with low-dose chest CT without contrast in 12 months 2. Aortic atherosclerosis Electronically Signed   By: Kerby Moors M.D.   On: 06/26/2015 15:32       Assessment & Plan:   Problem List Items Addressed This Visit    Aortic atherosclerosis (  [place])     She declines cholesterol medication.  Continue risk factor modification.        Diabetes mellitus ([place])    Brought in no recorded sugar readings.  Discussed diet and exercise.  Discussed adjusting medications.  She is taking her medication on a more regular basis now.  Have her check and record sugars.  Follow met b and a1c.  Desires not to change medication.       Relevant Orders   Hemoglobin U4Q   Basic metabolic panel   GERD (gastroesophageal reflux disease)    Upper symptoms controlled on current medication.        Health care maintenance    Physical today.  Has declined mammogram.  Colonoscopy 12/2008 - diverticulosis, otherwise normal.  Brother had part of colon removed.  Unsure of reason.        Hypercholesterolemia    Low cholesterol diet and exercise.  Has declined medication.  Continues to decline.  Discussed with her today.       Relevant Orders   Lipid panel   Hepatic function panel   Hypertension    She is not taking her lisinopril on a regular basis.  Discussed importance of taking on a regular basis.  Follow pressures.  Follow metabolic panel.        Relevant Orders   CBC with Differential/Platelet   TSH   Hypothyroidism    On thyroid replacement.  Follow tsh.       Relevant Medications   levothyroxine (SYNTHROID, LEVOTHROID) 100 MCG tablet   Left carotid bruit    Continue daily aspirin.  Has declined f/u carotid ultrasound.         Other Visit Diagnoses    Routine general medical examination at a health care facility    -  Primary       Einar Pheasant, MD

## 2015-09-27 ENCOUNTER — Encounter: Payer: Self-pay | Admitting: Internal Medicine

## 2015-09-27 NOTE — Assessment & Plan Note (Signed)
She declines cholesterol medication.  Continue risk factor modification.

## 2015-09-27 NOTE — Assessment & Plan Note (Addendum)
Brought in no recorded sugar readings.  Discussed diet and exercise.  Discussed adjusting medications.  She is taking her medication on a more regular basis now.  Have her check and record sugars.  Follow met b and a1c.  Desires not to change medication.

## 2015-09-28 NOTE — Assessment & Plan Note (Signed)
Low cholesterol diet and exercise.  Has declined medication.  Continues to decline.  Discussed with her today.

## 2015-09-28 NOTE — Assessment & Plan Note (Signed)
On thyroid replacement.  Follow tsh.  

## 2015-09-28 NOTE — Assessment & Plan Note (Signed)
Continue daily aspirin.  Has declined f/u carotid ultrasound.

## 2015-09-28 NOTE — Assessment & Plan Note (Signed)
She is not taking her lisinopril on a regular basis.  Discussed importance of taking on a regular basis.  Follow pressures.  Follow metabolic panel.

## 2015-09-28 NOTE — Assessment & Plan Note (Signed)
Upper symptoms controlled on current medication.

## 2015-09-28 NOTE — Assessment & Plan Note (Signed)
Physical today.  Has declined mammogram.  Colonoscopy 12/2008 - diverticulosis, otherwise normal.  Brother had part of colon removed.  Unsure of reason.

## 2015-11-20 ENCOUNTER — Other Ambulatory Visit (INDEPENDENT_AMBULATORY_CARE_PROVIDER_SITE_OTHER): Payer: No Typology Code available for payment source

## 2015-11-20 DIAGNOSIS — E7800 Pure hypercholesterolemia, unspecified: Secondary | ICD-10-CM

## 2015-11-20 DIAGNOSIS — I10 Essential (primary) hypertension: Secondary | ICD-10-CM

## 2015-11-20 DIAGNOSIS — E119 Type 2 diabetes mellitus without complications: Secondary | ICD-10-CM

## 2015-11-20 LAB — CBC WITH DIFFERENTIAL/PLATELET
BASOS ABS: 0.1 10*3/uL (ref 0.0–0.1)
Basophils Relative: 0.8 % (ref 0.0–3.0)
Eosinophils Absolute: 0.4 10*3/uL (ref 0.0–0.7)
Eosinophils Relative: 4.3 % (ref 0.0–5.0)
HEMATOCRIT: 42.5 % (ref 36.0–46.0)
Hemoglobin: 14 g/dL (ref 12.0–15.0)
LYMPHS PCT: 46.3 % — AB (ref 12.0–46.0)
Lymphs Abs: 4.1 10*3/uL — ABNORMAL HIGH (ref 0.7–4.0)
MCHC: 33 g/dL (ref 30.0–36.0)
MCV: 87.6 fl (ref 78.0–100.0)
MONOS PCT: 3.8 % (ref 3.0–12.0)
Monocytes Absolute: 0.3 10*3/uL (ref 0.1–1.0)
Neutro Abs: 3.9 10*3/uL (ref 1.4–7.7)
Neutrophils Relative %: 44.8 % (ref 43.0–77.0)
Platelets: 174 10*3/uL (ref 150.0–400.0)
RBC: 4.85 Mil/uL (ref 3.87–5.11)
RDW: 15.6 % — ABNORMAL HIGH (ref 11.5–15.5)
WBC: 8.8 10*3/uL (ref 4.0–10.5)

## 2015-11-20 LAB — BASIC METABOLIC PANEL
BUN: 12 mg/dL (ref 6–23)
CHLORIDE: 106 meq/L (ref 96–112)
CO2: 27 mEq/L (ref 19–32)
CREATININE: 1.01 mg/dL (ref 0.40–1.20)
Calcium: 9.8 mg/dL (ref 8.4–10.5)
GFR: 57.59 mL/min — ABNORMAL LOW (ref >60.00)
Glucose, Bld: 129 mg/dL — ABNORMAL HIGH (ref 70–99)
Potassium: 4.8 mEq/L (ref 3.5–5.1)
Sodium: 141 mEq/L (ref 135–145)

## 2015-11-20 LAB — HEPATIC FUNCTION PANEL
ALBUMIN: 4.3 g/dL (ref 3.5–5.2)
ALK PHOS: 80 U/L (ref 39–117)
ALT: 12 U/L (ref 0–35)
AST: 14 U/L (ref 0–37)
Bilirubin, Direct: 0.1 mg/dL (ref 0.0–0.3)
Total Bilirubin: 0.5 mg/dL (ref 0.2–1.2)
Total Protein: 7.1 g/dL (ref 6.0–8.3)

## 2015-11-20 LAB — LIPID PANEL
CHOL/HDL RATIO: 4
Cholesterol: 185 mg/dL (ref 0–200)
HDL: 42.8 mg/dL (ref >39.00)
LDL CALC: 102 mg/dL — AB (ref 0–99)
NonHDL: 141.7
TRIGLYCERIDES: 200 mg/dL — AB (ref 0.0–149.0)
VLDL: 40 mg/dL (ref 0.0–40.0)

## 2015-11-20 LAB — TSH: TSH: 1.84 u[IU]/mL (ref 0.35–4.50)

## 2015-11-20 LAB — HEMOGLOBIN A1C: Hgb A1c MFr Bld: 7.5 % — ABNORMAL HIGH (ref 4.6–6.5)

## 2015-11-21 ENCOUNTER — Encounter: Payer: Self-pay | Admitting: Internal Medicine

## 2015-11-30 ENCOUNTER — Ambulatory Visit (INDEPENDENT_AMBULATORY_CARE_PROVIDER_SITE_OTHER): Payer: No Typology Code available for payment source | Admitting: Internal Medicine

## 2015-11-30 ENCOUNTER — Encounter: Payer: Self-pay | Admitting: Internal Medicine

## 2015-11-30 VITALS — BP 158/80 | HR 61 | Temp 98.5°F | Ht 63.0 in | Wt 149.6 lb

## 2015-11-30 DIAGNOSIS — J209 Acute bronchitis, unspecified: Secondary | ICD-10-CM

## 2015-11-30 DIAGNOSIS — E7800 Pure hypercholesterolemia, unspecified: Secondary | ICD-10-CM

## 2015-11-30 DIAGNOSIS — M7989 Other specified soft tissue disorders: Secondary | ICD-10-CM

## 2015-11-30 DIAGNOSIS — E039 Hypothyroidism, unspecified: Secondary | ICD-10-CM | POA: Diagnosis not present

## 2015-11-30 DIAGNOSIS — I10 Essential (primary) hypertension: Secondary | ICD-10-CM | POA: Diagnosis not present

## 2015-11-30 DIAGNOSIS — E119 Type 2 diabetes mellitus without complications: Secondary | ICD-10-CM

## 2015-11-30 DIAGNOSIS — R0989 Other specified symptoms and signs involving the circulatory and respiratory systems: Secondary | ICD-10-CM | POA: Diagnosis not present

## 2015-11-30 DIAGNOSIS — I700 Atherosclerosis of aorta: Secondary | ICD-10-CM

## 2015-11-30 MED ORDER — AZITHROMYCIN 250 MG PO TABS: ORAL_TABLET | ORAL | 0 refills | Status: DC

## 2015-11-30 NOTE — Patient Instructions (Signed)
Saline nasal spray - flush nose at least 2-3x/day  nasacort nasal spray - 2 sprays each nostril one time per day.  Do this in the evening.    Robitussin twice a day as needed.    Take the antibiotic as directed.    Probiotic daily while on the antibiotics and for 2 weeks after completing antibiotics.

## 2015-11-30 NOTE — Progress Notes (Signed)
Pre visit review using our clinic review tool, if applicable. No additional management support is needed unless otherwise documented below in the visit note. 

## 2015-11-30 NOTE — Progress Notes (Signed)
Patient ID: Ann Collins, female   DOB: 1945-11-03, 70 y.o.   MRN: [id number]   Subjective:    Patient ID: Ann Collins, female    DOB: 09/28/1945, 70 y.o.   MRN: [id number]  HPI  Patient here for a scheduled follow up.  She reports she is doing relatively well.  Does report persistent cough and congestion over the last 3-4 weeks.  Some increased nasal congestion.  Some drainage.  Some productive cough.  No fever.  No sob.  No acid reflux.  No abdominal pain or cramping.  Bowels stable.  She injured her fifth finger.  Still with some redness and minimal soft tissue swelling.  Able to bend and move finger without increased pain.  No pain to palpation.  No chest pain.  Increased stress with her son's issues.  Discussed with her today.  She does not feel needs anything more at this time.    Past Medical History:  Diagnosis Date  . Diabetes mellitus ([place])   . Diverticulosis   . GERD (gastroesophageal reflux disease)   . Hematuria   . Hypercholesterolemia   . Hypertension   . Hypothyroidism   . Kidney stones    Past Surgical History:  Procedure Laterality Date  . APPENDECTOMY    . BREAST BIOPSY  1969   right  . CHOLECYSTECTOMY  2005   Family History  Problem Relation Age of Onset  . Brain cancer Mother     history of brain tumor  . Lung disease Father   . Diabetes Brother     x2  . Breast cancer Neg Hx   . Colon cancer Neg Hx    Social History   Social History  . Marital status: Married    Spouse name: N/A  . Number of children: N/A  . Years of education: N/A   Social History Main Topics  . Smoking status: Current Every Day Smoker    Packs/day: 1.00    Years: 50.00  . Smokeless tobacco: Never Used  . Alcohol use No  . Drug use: No  . Sexual activity: Not Asked   Other Topics Concern  . None   Social History Narrative  . None    Outpatient Encounter Prescriptions as of 11/30/2015  Medication Sig  . ciclopirox (PENLAC) 8 % solution Apply topically at  bedtime. Apply over nail and surrounding skin. Apply daily over previous coat. After seven (7) days, may remove with alcohol and continue cycle.  Marland Kitchen JANUVIA 100 MG tablet take 1 tablet by mouth once daily  . levothyroxine (SYNTHROID, LEVOTHROID) 100 MCG tablet TAKE ONE TABLET BY MOUTH ONCE DAILY.  Marland Kitchen lisinopril (PRINIVIL,ZESTRIL) 10 MG tablet Take 1 tablet (10 mg total) by mouth daily.  . metFORMIN (GLUCOPHAGE-XR) 500 MG 24 hr tablet take 1 tablet by mouth twice a day with meal  . pantoprazole (PROTONIX) 40 MG tablet take 1 tablet by mouth twice a day before meals  . azithromycin (ZITHROMAX) 250 MG tablet Take two tablets x 1 day then 1 tablet per day for four more days   No facility-administered encounter medications on file as of 11/30/2015.     Review of Systems  Constitutional: Negative for appetite change and unexpected weight change.  HENT: Positive for congestion and postnasal drip.   Respiratory: Positive for cough. Negative for chest tightness and shortness of breath.   Cardiovascular: Negative for chest pain, palpitations and leg swelling.  Gastrointestinal: Negative for abdominal pain, diarrhea, nausea and vomiting.  Musculoskeletal: Negative  for back pain and myalgias.  Skin: Negative for rash.       Fifth finger - redness as outlined.    Neurological: Negative for dizziness, light-headedness and headaches.  Psychiatric/Behavioral: Negative for agitation and dysphoric mood.       Objective:     Blood pressure rechecked by me:  148/78  Physical Exam  Constitutional: She appears well-developed and well-nourished. No distress.  HENT:  Nose: Nose normal.  Mouth/Throat: Oropharynx is clear and moist.  Neck: Neck supple. No thyromegaly present.  Cardiovascular: Normal rate and regular rhythm.   Pulmonary/Chest: Breath sounds normal. No respiratory distress. She has no wheezes.  Abdominal: Soft. Bowel sounds are normal. There is no tenderness.  Musculoskeletal: She exhibits  no edema or tenderness.  Lymphadenopathy:    She has no cervical adenopathy.  Skin: No rash noted.  Minimal soft tissue swelling - fifth finger.  Minimal erythema.    Psychiatric: She has a normal mood and affect. Her behavior is normal.    BP (!) 158/80   Pulse 61   Temp 98.5 F (36.9 C) (Oral)   Ht 5' 3" (1.6 m)   Wt 149 lb 9.6 oz (67.9 kg)   LMP 03/10/1991   SpO2 96%   BMI 26.50 kg/m  Wt Readings from Last 3 Encounters:  11/30/15 149 lb 9.6 oz (67.9 kg)  09/21/15 150 lb 4 oz (68.2 kg)  06/26/15 150 lb (68 kg)     Lab Results  Component Value Date   WBC 8.8 11/20/2015   HGB 14.0 11/20/2015   HCT 42.5 11/20/2015   PLT 174.0 11/20/2015   GLUCOSE 129 (H) 11/20/2015   CHOL 185 11/20/2015   TRIG 200.0 (H) 11/20/2015   HDL 42.80 11/20/2015   LDLDIRECT 146.0 08/08/2014   LDLCALC 102 (H) 11/20/2015   ALT 12 11/20/2015   AST 14 11/20/2015   NA 141 11/20/2015   K 4.8 11/20/2015   CL 106 11/20/2015   CREATININE 1.01 11/20/2015   BUN 12 11/20/2015   CO2 27 11/20/2015   TSH 1.84 11/20/2015   HGBA1C 7.5 (H) 11/20/2015   MICROALBUR <0.7 03/27/2015    Ct Chest Lung Ca Screen Low Dose W/o Cm  Result Date: 06/26/2015 CLINICAL DATA:  Lung cancer screening. Fifty pack-year history. Asymptomatic, current smoker. EXAM: CT CHEST WITHOUT CONTRAST LOW-DOSE FOR LUNG CANCER SCREENING TECHNIQUE: Multidetector CT imaging of the chest was performed following the standard protocol without IV contrast. COMPARISON:  None. FINDINGS: Mediastinum/Nodes: The heart size appears within normal limits. There is no pericardial effusion. Aortic atherosclerosis noted. No enlarged mediastinal or hilar lymph nodes. No axillary or supraclavicular adenopathy. Lungs/Pleura: There are a few small nodules identified. The largest is in the left upper lobe and has an equivalent diameter of 3.4 mm, image number 101 of series 3. Upper abdomen: Right adrenal adenoma is identified measuring 1.3 x 3.2 cm, image 50 of  series 2. Intermediate attenuating structure arising from the upper pole of left kidney measures 2.6 cm. This is incompletely characterized without IV contrast. Previous cholecystectomy. 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 No aggressive lytic or sclerotic bone lesion identified. IMPRESSION: 1. Lung-RADS Category 2, benign appearance or behavior. Continue annual screening with low-dose chest CT without contrast in 12 months 2. Aortic atherosclerosis Electronically Signed   By: Kerby Moors M.D.   On: 06/26/2015 15:32       Assessment & Plan:   Problem List Items Addressed This Visit    Acute bronchitis  Symptoms persistent as outlined.  Saline nasal spray and nasacort nasal spray as outlined.  Robitussin as directed.  zpak as directed.  Follow.  Notify me if persistent symptoms.       Aortic atherosclerosis ([facility])    Declines cholesterol medication.  Follow.       Diabetes mellitus ([place])    a1c improved on recent check.  Low carb diet and exercise.  Follow met b and a1c.   Lab Results  Component Value Date   HGBA1C 7.5 (H) 11/20/2015        Hypercholesterolemia    Recent cholesterol improved.  She has declined to take cholesterol medication.  Low cholesterol diet and exercise.  Follow lipid panel.        Hypertension    Blood  Pressure elevated.  Not taking lisinopril.  Needs to restart.  Follow pressures.  Follow metabolic panel.       Hypothyroidism    On thyroid replacement.  Follow tsh.       Left carotid bruit    Has declined f/u carotid ultrasound.        Other Visit Diagnoses    Finger swelling    -  Primary   some redness and soft tissue swelling as outlined.  no pain.  discussed xray. she declines.  splint.  zpak should cover if any infection.  follow.         Einar Pheasant, MD

## 2015-12-01 ENCOUNTER — Encounter: Payer: Self-pay | Admitting: Internal Medicine

## 2015-12-01 NOTE — Assessment & Plan Note (Signed)
Declines cholesterol medication.  Follow.  

## 2015-12-01 NOTE — Assessment & Plan Note (Signed)
Blood  Pressure elevated.  Not taking lisinopril.  Needs to restart.  Follow pressures.  Follow metabolic panel.

## 2015-12-01 NOTE — Assessment & Plan Note (Signed)
On thyroid replacement.  Follow tsh.  

## 2015-12-01 NOTE — Assessment & Plan Note (Signed)
Recent cholesterol improved.  She has declined to take cholesterol medication.  Low cholesterol diet and exercise.  Follow lipid panel.

## 2015-12-01 NOTE — Assessment & Plan Note (Signed)
Symptoms persistent as outlined.  Saline nasal spray and nasacort nasal spray as outlined.  Robitussin as directed.  zpak as directed.  Follow.  Notify me if persistent symptoms.

## 2015-12-01 NOTE — Assessment & Plan Note (Addendum)
a1c improved on recent check.  Low carb diet and exercise.  Follow met b and a1c.   Lab Results  Component Value Date   HGBA1C 7.5 (H) 11/20/2015

## 2015-12-01 NOTE — Assessment & Plan Note (Signed)
Has declined f/u carotid ultrasound.

## 2016-01-07 ENCOUNTER — Telehealth: Payer: Self-pay | Admitting: *Deleted

## 2016-01-07 NOTE — Telephone Encounter (Signed)
Pt has requested to have a different Rx in the place of Januvia. Pt state that her insurance will not longer cover [place]

## 2016-01-07 NOTE — Telephone Encounter (Signed)
Please advise 

## 2016-01-08 NOTE — Telephone Encounter (Signed)
Patient states she would like to try Amaryl or glimepiride

## 2016-01-08 NOTE — Telephone Encounter (Signed)
Need to know what similar medication her insurance will cover.  They should have given her a list or she can ask pharmacy or call her insurance to see what similar medication is covered.

## 2016-01-08 NOTE — Telephone Encounter (Signed)
Pt called back returning your call. Thank you!  Call pt @ [phone]

## 2016-01-08 NOTE — Telephone Encounter (Signed)
Left message to return call 

## 2016-01-09 ENCOUNTER — Other Ambulatory Visit: Payer: Self-pay | Admitting: Internal Medicine

## 2016-01-09 NOTE — Telephone Encounter (Signed)
Need to know what her allergy is to sulfa antibiotics.  I also need her to check and record her blood sugars and send in readings over the next 1-2 weeks.

## 2016-01-11 NOTE — Telephone Encounter (Signed)
Patient states she has not had any reactions to sulfa.

## 2016-01-11 NOTE — Telephone Encounter (Signed)
Left message to return call 

## 2016-01-12 NOTE — Telephone Encounter (Signed)
Notify her that I am not opposed to her trying this medication, but I would like for her to send in a copy of her sugar readings over the next 1-2 weeks.  I can see how her sugars are doing and then can determine dose needed.

## 2016-01-12 NOTE — Telephone Encounter (Signed)
Patient notified, what dose would you like me to send in

## 2016-01-12 NOTE — Telephone Encounter (Signed)
Patient notified

## 2016-01-12 NOTE — Telephone Encounter (Signed)
She is on metformin now.  I am going to see how her sugars are doing and then send in rx.  She should send in sugars over the next 1-2 weeks for me to review.

## 2016-01-12 NOTE — Telephone Encounter (Signed)
Left message to return call 

## 2016-01-22 ENCOUNTER — Other Ambulatory Visit: Payer: Self-pay | Admitting: Internal Medicine

## 2016-02-04 ENCOUNTER — Ambulatory Visit: Payer: No Typology Code available for payment source | Admitting: Internal Medicine

## 2016-03-02 DIAGNOSIS — Z23 Encounter for immunization: Secondary | ICD-10-CM | POA: Diagnosis not present

## 2016-04-08 ENCOUNTER — Ambulatory Visit (INDEPENDENT_AMBULATORY_CARE_PROVIDER_SITE_OTHER): Payer: Medicare Other | Admitting: Internal Medicine

## 2016-04-08 ENCOUNTER — Encounter: Payer: Self-pay | Admitting: Internal Medicine

## 2016-04-08 VITALS — BP 118/68 | HR 65 | Temp 98.6°F | Ht 63.0 in | Wt 152.6 lb

## 2016-04-08 DIAGNOSIS — Z87891 Personal history of nicotine dependence: Secondary | ICD-10-CM | POA: Diagnosis not present

## 2016-04-08 DIAGNOSIS — R0989 Other specified symptoms and signs involving the circulatory and respiratory systems: Secondary | ICD-10-CM | POA: Diagnosis not present

## 2016-04-08 DIAGNOSIS — N289 Disorder of kidney and ureter, unspecified: Secondary | ICD-10-CM

## 2016-04-08 DIAGNOSIS — I10 Essential (primary) hypertension: Secondary | ICD-10-CM

## 2016-04-08 DIAGNOSIS — K2100 Gastro-esophageal reflux disease with esophagitis, without bleeding: Secondary | ICD-10-CM

## 2016-04-08 DIAGNOSIS — E039 Hypothyroidism, unspecified: Secondary | ICD-10-CM

## 2016-04-08 DIAGNOSIS — E7800 Pure hypercholesterolemia, unspecified: Secondary | ICD-10-CM

## 2016-04-08 DIAGNOSIS — E119 Type 2 diabetes mellitus without complications: Secondary | ICD-10-CM | POA: Diagnosis not present

## 2016-04-08 DIAGNOSIS — I700 Atherosclerosis of aorta: Secondary | ICD-10-CM

## 2016-04-08 LAB — BASIC METABOLIC PANEL
BUN: 18 mg/dL (ref 6–23)
CO2: 29 mEq/L (ref 19–32)
CREATININE: 1.04 mg/dL (ref 0.40–1.20)
Calcium: 9.9 mg/dL (ref 8.4–10.5)
Chloride: 102 mEq/L (ref 96–112)
GFR: 55.62 mL/min — AB (ref >60.00)
Glucose, Bld: 174 mg/dL — ABNORMAL HIGH (ref 70–99)
Potassium: 4.8 mEq/L (ref 3.5–5.1)
Sodium: 137 mEq/L (ref 135–145)

## 2016-04-08 LAB — HEPATIC FUNCTION PANEL
ALBUMIN: 4.3 g/dL (ref 3.5–5.2)
ALT: 13 U/L (ref 0–35)
AST: 13 U/L (ref 0–37)
Alkaline Phosphatase: 92 U/L (ref 39–117)
Bilirubin, Direct: 0.1 mg/dL (ref 0.0–0.3)
Total Bilirubin: 0.5 mg/dL (ref 0.2–1.2)
Total Protein: 7.2 g/dL (ref 6.0–8.3)

## 2016-04-08 LAB — HEMOGLOBIN A1C: HEMOGLOBIN A1C: 8.4 % — AB (ref 4.6–6.5)

## 2016-04-08 LAB — LIPID PANEL
CHOL/HDL RATIO: 6
Cholesterol: 247 mg/dL — ABNORMAL HIGH (ref 0–200)
HDL: 44.3 mg/dL (ref >39.00)
NONHDL: 202.62
TRIGLYCERIDES: 206 mg/dL — AB (ref 0.0–149.0)
VLDL: 41.2 mg/dL — AB (ref 0.0–40.0)

## 2016-04-08 LAB — LDL CHOLESTEROL, DIRECT: LDL DIRECT: 145 mg/dL

## 2016-04-08 MED ORDER — METFORMIN HCL ER 500 MG PO TB24: ORAL_TABLET | ORAL | 3 refills | Status: DC

## 2016-04-08 MED ORDER — LEVOTHYROXINE SODIUM 100 MCG PO TABS: ORAL_TABLET | ORAL | 1 refills | Status: DC

## 2016-04-08 MED ORDER — LISINOPRIL 10 MG PO TABS: 10.0000 mg | ORAL_TABLET | Freq: Every day | ORAL | 3 refills | Status: DC

## 2016-04-08 MED ORDER — PANTOPRAZOLE SODIUM 40 MG PO TBEC: DELAYED_RELEASE_TABLET | ORAL | 3 refills | Status: DC

## 2016-04-08 NOTE — Progress Notes (Signed)
Patient ID: Ann Collins, female   DOB: [birth year], 71 y.o.   MRN: [id number]   Subjective:    Patient ID: Ann Collins, female    DOB: 1945-04-07, 71 y.o.   MRN: [id number]  HPI  Patient here for a scheduled follow up.  She reports she is doing relatively well.  Still with increased stress with her grandson's issues.  Discussed with her today.  She feels she is handling things relatively well.  Does not feel needs anything more at this time.  No chest pain.  No sob.  No acid reflux.  No abdominal pain.  Bowel moving.  States sugars are averaging 140-150 in the am.  Not taking januvia.  Only on metformin.  Discussed diet and exercise.  Discussed previous CT scan.  Discussed f/u renatl ultrasound.  She is in agreement.  Not taking her lisinopril.     Past Medical History:  Diagnosis Date  . Diabetes mellitus ([place])   . Diverticulosis   . GERD (gastroesophageal reflux disease)   . Hematuria   . Hypercholesterolemia   . Hypertension   . Hypothyroidism   . Kidney stones    Past Surgical History:  Procedure Laterality Date  . APPENDECTOMY    . BREAST BIOPSY  1969   right  . CHOLECYSTECTOMY  2005   Family History  Problem Relation Age of Onset  . Brain cancer Mother     history of brain tumor  . Lung disease Father   . Diabetes Brother     x2  . Breast cancer Neg Hx   . Colon cancer Neg Hx    Social History   Social History  . Marital status: Married    Spouse name: N/A  . Number of children: N/A  . Years of education: N/A   Social History Main Topics  . Smoking status: Current Every Day Smoker    Packs/day: 1.00    Years: 50.00  . Smokeless tobacco: Never Used  . Alcohol use No  . Drug use: No  . Sexual activity: Not Asked   Other Topics Concern  . None   Social History Narrative  . None    Outpatient Encounter Prescriptions as of 04/08/2016  Medication Sig  . levothyroxine (SYNTHROID, LEVOTHROID) 100 MCG tablet TAKE ONE TABLET BY MOUTH ONCE DAILY.  Marland Kitchen  lisinopril (PRINIVIL,ZESTRIL) 10 MG tablet Take 1 tablet (10 mg total) by mouth daily.  . metFORMIN (GLUCOPHAGE-XR) 500 MG 24 hr tablet take 1 tablet by mouth twice a day with meal  . pantoprazole (PROTONIX) 40 MG tablet take 1 tablet by mouth twice a day before meals  . [DISCONTINUED] levothyroxine (SYNTHROID, LEVOTHROID) 100 MCG tablet TAKE ONE TABLET BY MOUTH ONCE DAILY.  . [DISCONTINUED] lisinopril (PRINIVIL,ZESTRIL) 10 MG tablet Take 1 tablet (10 mg total) by mouth daily.  . [DISCONTINUED] metFORMIN (GLUCOPHAGE-XR) 500 MG 24 hr tablet take 1 tablet by mouth twice a day with meal  . [DISCONTINUED] pantoprazole (PROTONIX) 40 MG tablet take 1 tablet by mouth twice a day before meals  . [DISCONTINUED] azithromycin (ZITHROMAX) 250 MG tablet Take two tablets x 1 day then 1 tablet per day for four more days  . [DISCONTINUED] ciclopirox (PENLAC) 8 % solution Apply topically at bedtime. Apply over nail and surrounding skin. Apply daily over previous coat. After seven (7) days, may remove with alcohol and continue cycle.  . [DISCONTINUED] JANUVIA 100 MG tablet take 1 tablet by mouth once daily   No facility-administered encounter medications on  file as of 04/08/2016.     Review of Systems  Constitutional: Negative for appetite change and unexpected weight change.  HENT: Negative for congestion and sinus pressure.   Respiratory: Negative for cough, chest tightness and shortness of breath.   Cardiovascular: Negative for chest pain, palpitations and leg swelling.  Gastrointestinal: Negative for abdominal pain, diarrhea, nausea and vomiting.  Genitourinary: Negative for difficulty urinating and dysuria.  Musculoskeletal: Negative for back pain and joint swelling.  Skin: Negative for color change and rash.  Neurological: Negative for dizziness, light-headedness and headaches.  Psychiatric/Behavioral: Negative for agitation and dysphoric mood.       Objective:   Blood pressure rechecked by me:   148/78  Physical Exam  Constitutional: She appears well-developed and well-nourished. No distress.  HENT:  Nose: Nose normal.  Mouth/Throat: Oropharynx is clear and moist.  Neck: Neck supple. No thyromegaly present.  Cardiovascular: Normal rate and regular rhythm.   Pulmonary/Chest: Breath sounds normal. No respiratory distress. She has no wheezes.  Abdominal: Soft. Bowel sounds are normal. There is no tenderness.  Musculoskeletal: She exhibits no edema or tenderness.  Lymphadenopathy:    She has no cervical adenopathy.  Skin: No rash noted. No erythema.  Psychiatric: She has a normal mood and affect. Her behavior is normal.    BP 118/68 (BP Location: Left Arm, Patient Position: Sitting, Cuff Size: Large)   Pulse 65   Temp 98.6 F (37 C) (Oral)   Ht _0  (1.6 m)   Wt 152 lb 9.6 oz (69.2 kg)   LMP 03/10/1991   SpO2 96%   BMI 27.03 kg/m  Wt Readings from Last 3 Encounters:  04/08/16 152 lb 9.6 oz (69.2 kg)  11/30/15 149 lb 9.6 oz (67.9 kg)  09/21/15 150 lb 4 oz (68.2 kg)     Lab Results  Component Value Date   WBC 8.8 11/20/2015   HGB 14.0 11/20/2015   HCT 42.5 11/20/2015   PLT 174.0 11/20/2015   GLUCOSE 174 (H) 04/08/2016   CHOL 247 (H) 04/08/2016   TRIG 206.0 (H) 04/08/2016   HDL 44.30 04/08/2016   LDLDIRECT 145.0 04/08/2016   LDLCALC 102 (H) 11/20/2015   ALT 13 04/08/2016   AST 13 04/08/2016   NA 137 04/08/2016   K 4.8 04/08/2016   CL 102 04/08/2016   CREATININE 1.04 04/08/2016   BUN 18 04/08/2016   CO2 29 04/08/2016   TSH 1.84 11/20/2015   HGBA1C 8.4 (H) 04/08/2016   MICROALBUR <0.7 03/27/2015    Ct Chest Lung Ca Screen Low Dose W/o Cm  Result Date: 06/26/2015 CLINICAL DATA:  Lung cancer screening. Fifty pack-year history. Asymptomatic, current smoker. EXAM: CT CHEST WITHOUT CONTRAST LOW-DOSE FOR LUNG CANCER SCREENING TECHNIQUE: Multidetector CT imaging of the chest was performed following the standard protocol without IV contrast. COMPARISON:  None.  FINDINGS: Mediastinum/Nodes: The heart size appears within normal limits. There is no pericardial effusion. Aortic atherosclerosis noted. No enlarged mediastinal or hilar lymph nodes. No axillary or supraclavicular adenopathy. Lungs/Pleura: There are a few small nodules identified. The largest is in the left upper lobe and has an equivalent diameter of 3.4 mm, image number 101 of series 3. Upper abdomen: Right adrenal adenoma is identified measuring 1.3 x 3.2 cm, image 50 of series 2. Intermediate attenuating structure arising from the upper pole of left kidney measures 2.6 cm. This is incompletely characterized without IV contrast. Previous cholecystectomy. 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 No aggressive lytic or sclerotic bone lesion  identified. IMPRESSION: 1. Lung-RADS Category 2, benign appearance or behavior. Continue annual screening with low-dose chest CT without contrast in 12 months 2. Aortic atherosclerosis Electronically Signed   By: Kerby Moors M.D.   On: 06/26/2015 15:32       Assessment & Plan:   Problem List Items Addressed This Visit    Aortic atherosclerosis ([place])    Declines cholesterol medication.        Relevant Medications   lisinopril (PRINIVIL,ZESTRIL) 10 MG tablet   Diabetes mellitus ([place])    Trying to watch her diet.  Follow met b and a1c.  Keep up to date with eye exams.        Relevant Medications   lisinopril (PRINIVIL,ZESTRIL) 10 MG tablet   metFORMIN (GLUCOPHAGE-XR) 500 MG 24 hr tablet   Other Relevant Orders   Hemoglobin A1c (Completed)   GERD (gastroesophageal reflux disease)    Controlled on protonix.        Relevant Medications   pantoprazole (PROTONIX) 40 MG tablet   Hypercholesterolemia - Primary    She declines cholesterol medication.  Low cholesterol diet and exercise.  Follow lipid panel.        Relevant Medications   lisinopril (PRINIVIL,ZESTRIL) 10 MG tablet   Other Relevant Orders   Hepatic function  panel (Completed)   Lipid panel (Completed)   Hypertension    Blood pressure on recheck elevated.  She is not taking lisinopril.  Instructed her to start taking lisinopril daily.  Follow pressures.  Get her back in soon to reassess.        Relevant Medications   lisinopril (PRINIVIL,ZESTRIL) 10 MG tablet   Other Relevant Orders   Basic metabolic panel (Completed)   Hypothyroidism    On thyroid replacement.  Follow tsh.       Relevant Medications   levothyroxine (SYNTHROID, LEVOTHROID) 100 MCG tablet   Left carotid bruit    Declined f/u ultrasound at this time.  Discussed with her today.        Personal history of tobacco use, presenting hazards to health    Discussed the need to quit.        Renal lesion    Found on screening CT.  Discussed renal ultrasound.  After review of scan, will obtain CT scan to better evaluate.            Einar Pheasant, MD

## 2016-04-08 NOTE — Progress Notes (Signed)
Pre-visit discussion using our clinic review tool. No additional management support is needed unless otherwise documented below in the visit note.  

## 2016-04-10 ENCOUNTER — Encounter: Payer: Self-pay | Admitting: Internal Medicine

## 2016-04-10 DIAGNOSIS — N289 Disorder of kidney and ureter, unspecified: Secondary | ICD-10-CM | POA: Insufficient documentation

## 2016-04-10 NOTE — Assessment & Plan Note (Signed)
Declines cholesterol medication.   

## 2016-04-10 NOTE — Assessment & Plan Note (Signed)
Trying to watch her diet.  Follow met b and a1c.  Keep up to date with eye exams.

## 2016-04-10 NOTE — Assessment & Plan Note (Signed)
On thyroid replacement.  Follow tsh.  

## 2016-04-10 NOTE — Assessment & Plan Note (Signed)
Controlled on protonix.   

## 2016-04-10 NOTE — Assessment & Plan Note (Signed)
Blood pressure on recheck elevated.  She is not taking lisinopril.  Instructed her to start taking lisinopril daily.  Follow pressures.  Get her back in soon to reassess.

## 2016-04-10 NOTE — Assessment & Plan Note (Signed)
She declines cholesterol medication.  Low cholesterol diet and exercise.  Follow lipid panel.   

## 2016-04-10 NOTE — Assessment & Plan Note (Signed)
Declined f/u ultrasound at this time.  Discussed with her today.

## 2016-04-10 NOTE — Assessment & Plan Note (Signed)
Found on screening CT.  Discussed renal ultrasound.  After review of scan, will obtain CT scan to better evaluate.

## 2016-04-10 NOTE — Assessment & Plan Note (Signed)
Discussed the need to quit.   

## 2016-04-11 ENCOUNTER — Telehealth: Payer: Self-pay

## 2016-04-11 NOTE — Telephone Encounter (Signed)
lmtrc

## 2016-04-11 NOTE — Telephone Encounter (Signed)
-----   Message from Einar Pheasant, MD sent at 04/11/2016  3:37 AM EST ----- Please call and notify pt that her overall sugar control has increased when compared to the previous check (a1c 8.4).  If had no problems with [place], then would like to restart [place] 100mg  q day.  Check and record sugars and send in over the next 2-3 weeks.  Cholesterol remains elevated.  She has declined cholesterol medication previously.  If agreeable now, would like to start crestor 5mg  q day.  Will need liver panel checked 6 weeks after starting the cholesterol medication.  Kidney function tests stable.  Stay hydrated.  Liver function tests wnl.

## 2016-04-12 NOTE — Telephone Encounter (Signed)
Patient returned call she did not want to start Crestor. She also said that her insurance will not cover [place] wanted to know if there was something else you wanted to try.

## 2016-04-13 NOTE — Telephone Encounter (Signed)
Pt informed will do call If any questions

## 2016-04-13 NOTE — Telephone Encounter (Signed)
Have her check and record sugars bid and send in readings over the next 3 weeks.  I will review and then determine best medication to add to metformin.

## 2016-06-07 ENCOUNTER — Telehealth: Payer: Self-pay | Admitting: *Deleted

## 2016-06-07 NOTE — Telephone Encounter (Signed)
Left message for patient to notify them that it is time to schedule annual low dose lung cancer screening CT scan. Instructed patient to call back to verify information prior to the scan being scheduled.  

## 2016-06-08 ENCOUNTER — Telehealth: Payer: Self-pay | Admitting: *Deleted

## 2016-06-08 DIAGNOSIS — Z87891 Personal history of nicotine dependence: Secondary | ICD-10-CM

## 2016-06-08 NOTE — Telephone Encounter (Signed)
Notified patient that annual lung cancer screening low dose CT scan is due currently or will be in near future. Confirmed that patient is within the age range of 55-77, and asymptomatic, (no signs or symptoms of lung cancer). Patient denies illness that would prevent curative treatment for lung cancer if found. Verified smoking history, (current, 51 pack year). The shared decision making visit was done 06/26/15. Patient is agreeable for CT scan being scheduled.

## 2016-06-10 ENCOUNTER — Ambulatory Visit (INDEPENDENT_AMBULATORY_CARE_PROVIDER_SITE_OTHER): Payer: Medicare Other | Admitting: Internal Medicine

## 2016-06-10 ENCOUNTER — Encounter: Payer: Self-pay | Admitting: Internal Medicine

## 2016-06-10 VITALS — BP 150/88 | HR 74 | Temp 98.6°F | Resp 12 | Ht 63.0 in | Wt 152.8 lb

## 2016-06-10 DIAGNOSIS — R1031 Right lower quadrant pain: Secondary | ICD-10-CM | POA: Diagnosis not present

## 2016-06-10 DIAGNOSIS — E039 Hypothyroidism, unspecified: Secondary | ICD-10-CM

## 2016-06-10 DIAGNOSIS — I10 Essential (primary) hypertension: Secondary | ICD-10-CM | POA: Diagnosis not present

## 2016-06-10 DIAGNOSIS — N289 Disorder of kidney and ureter, unspecified: Secondary | ICD-10-CM

## 2016-06-10 DIAGNOSIS — E7800 Pure hypercholesterolemia, unspecified: Secondary | ICD-10-CM | POA: Diagnosis not present

## 2016-06-10 DIAGNOSIS — E119 Type 2 diabetes mellitus without complications: Secondary | ICD-10-CM

## 2016-06-10 DIAGNOSIS — I700 Atherosclerosis of aorta: Secondary | ICD-10-CM

## 2016-06-10 DIAGNOSIS — R300 Dysuria: Secondary | ICD-10-CM | POA: Diagnosis not present

## 2016-06-10 MED ORDER — GLIPIZIDE ER 2.5 MG PO TB24: 2.5000 mg | ORAL_TABLET | Freq: Every day | ORAL | 1 refills | Status: DC

## 2016-06-10 NOTE — Progress Notes (Signed)
Pre-visit discussion using our clinic review tool. No additional management support is needed unless otherwise documented below in the visit note.  

## 2016-06-10 NOTE — Progress Notes (Signed)
Patient ID: Ann Collins, female   DOB: 07-02-1945, 71 y.o.   MRN: [id number]   Subjective:    Patient ID: Ann Collins, female    DOB: 03-Feb-1946, 71 y.o.   MRN: [id number]  HPI  Patient here for a scheduled follow up.  She report she is doing relatively well.  No chest pain.  Tries to stay active.  States her am sugars are averaging 149-165.  Did not bring in sugar readings.  She is taking metformin.  States cannot tolerate increased doses of metformin.  a1c 8.4.  Discussed with her today.  Willing to try another medication.  Wants to stick with generic.  She reports no chest pain.  No sob.  No acid reflux.  She does report RLQ pain.  Some dysuria.  No vaginal symptoms.  Discussed her cholesterol labs.  She declines cholesterol medication.     Past Medical History:  Diagnosis Date  . Diabetes mellitus ([place])   . Diverticulosis   . GERD (gastroesophageal reflux disease)   . Hematuria   . Hypercholesterolemia   . Hypertension   . Hypothyroidism   . Kidney stones    Past Surgical History:  Procedure Laterality Date  . APPENDECTOMY    . BREAST BIOPSY  1969   right  . CHOLECYSTECTOMY  2005   Family History  Problem Relation Age of Onset  . Brain cancer Mother        history of brain tumor  . Lung disease Father   . Diabetes Brother        x2  . Breast cancer Neg Hx   . Colon cancer Neg Hx    Social History   Social History  . Marital status: Married    Spouse name: N/A  . Number of children: N/A  . Years of education: N/A   Social History Main Topics  . Smoking status: Current Every Day Smoker    Packs/day: 1.00    Years: 50.00  . Smokeless tobacco: Never Used  . Alcohol use No  . Drug use: No  . Sexual activity: Not Asked   Other Topics Concern  . None   Social History Narrative  . None    Outpatient Encounter Prescriptions as of 06/10/2016  Medication Sig  . levothyroxine (SYNTHROID, LEVOTHROID) 100 MCG tablet TAKE ONE TABLET BY MOUTH ONCE DAILY.  Marland Kitchen  lisinopril (PRINIVIL,ZESTRIL) 10 MG tablet Take 1 tablet (10 mg total) by mouth daily.  . metFORMIN (GLUCOPHAGE-XR) 500 MG 24 hr tablet take 1 tablet by mouth twice a day with meal  . pantoprazole (PROTONIX) 40 MG tablet take 1 tablet by mouth twice a day before meals  . glipiZIDE (GLUCOTROL XL) 2.5 MG 24 hr tablet Take 1 tablet (2.5 mg total) by mouth daily with breakfast.   No facility-administered encounter medications on file as of 06/10/2016.     Review of Systems  Constitutional: Negative for appetite change and unexpected weight change.  HENT: Negative for congestion and sinus pressure.   Respiratory: Negative for cough, chest tightness and shortness of breath.   Cardiovascular: Negative for chest pain, palpitations and leg swelling.  Gastrointestinal: Negative for diarrhea, nausea and vomiting.       RLQ pain.    Genitourinary: Positive for dysuria. Negative for difficulty urinating.  Musculoskeletal: Negative for joint swelling and myalgias.  Skin: Negative for color change and rash.  Neurological: Negative for dizziness, light-headedness and headaches.  Psychiatric/Behavioral: Negative for agitation and dysphoric mood.  Objective:    Physical Exam  Constitutional: She appears well-developed and well-nourished. No distress.  HENT:  Nose: Nose normal.  Mouth/Throat: Oropharynx is clear and moist.  Neck: Neck supple. No thyromegaly present.  Cardiovascular: Normal rate and regular rhythm.   Pulmonary/Chest: Breath sounds normal. No respiratory distress. She has no wheezes.  Abdominal: Soft. Bowel sounds are normal.  RLQ pain.   Musculoskeletal: She exhibits no edema or tenderness.  Lymphadenopathy:    She has no cervical adenopathy.  Skin: No rash noted. No erythema.  Psychiatric: She has a normal mood and affect. Her behavior is normal.    BP (!) 150/88 (BP Location: Left Arm, Patient Position: Sitting, Cuff Size: Normal)   Pulse 74   Temp 98.6 F (37 C) (Oral)    Resp 12   Ht 5' 3"  (1.6 m)   Wt 152 lb 12.8 oz (69.3 kg)   LMP 03/10/1991   SpO2 98%   BMI 27.07 kg/m  Wt Readings from Last 3 Encounters:  06/10/16 152 lb 12.8 oz (69.3 kg)  04/08/16 152 lb 9.6 oz (69.2 kg)  11/30/15 149 lb 9.6 oz (67.9 kg)     Lab Results  Component Value Date   WBC 8.8 11/20/2015   HGB 14.0 11/20/2015   HCT 42.5 11/20/2015   PLT 174.0 11/20/2015   GLUCOSE 174 (H) 04/08/2016   CHOL 247 (H) 04/08/2016   TRIG 206.0 (H) 04/08/2016   HDL 44.30 04/08/2016   LDLDIRECT 145.0 04/08/2016   LDLCALC 102 (H) 11/20/2015   ALT 13 04/08/2016   AST 13 04/08/2016   NA 137 04/08/2016   K 4.8 04/08/2016   CL 102 04/08/2016   CREATININE 1.04 04/08/2016   BUN 18 04/08/2016   CO2 29 04/08/2016   TSH 1.84 11/20/2015   HGBA1C 8.4 (H) 04/08/2016   MICROALBUR <0.7 03/27/2015    Ct Chest Lung Ca Screen Low Dose W/o Cm  Result Date: 06/26/2015 CLINICAL DATA:  Lung cancer screening. Fifty pack-year history. Asymptomatic, current smoker. EXAM: CT CHEST WITHOUT CONTRAST LOW-DOSE FOR LUNG CANCER SCREENING TECHNIQUE: Multidetector CT imaging of the chest was performed following the standard protocol without IV contrast. COMPARISON:  None. FINDINGS: Mediastinum/Nodes: The heart size appears within normal limits. There is no pericardial effusion. Aortic atherosclerosis noted. No enlarged mediastinal or hilar lymph nodes. No axillary or supraclavicular adenopathy. Lungs/Pleura: There are a few small nodules identified. The largest is in the left upper lobe and has an equivalent diameter of 3.4 mm, image number 101 of series 3. Upper abdomen: Right adrenal adenoma is identified measuring 1.3 x 3.2 cm, image 50 of series 2. Intermediate attenuating structure arising from the upper pole of left kidney measures 2.6 cm. This is incompletely characterized without IV contrast. Previous cholecystectomy. 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 No aggressive lytic or  sclerotic bone lesion identified. IMPRESSION: 1. Lung-RADS Category 2, benign appearance or behavior. Continue annual screening with low-dose chest CT without contrast in 12 months 2. Aortic atherosclerosis Electronically Signed   By: Kerby Moors M.D.   On: 06/26/2015 15:32       Assessment & Plan:   Problem List Items Addressed This Visit    Aortic atherosclerosis ([place])    Declines cholesterol medication.        Diabetes mellitus ([place])    Poorly controlled.  Unable to increase metformin secondary to intolerance.  Start glipizide.  Check sugars.  Low carb diet and exercise.  Follow met b and a1c.  Relevant Medications   glipiZIDE (GLUCOTROL XL) 2.5 MG 24 hr tablet   Hypercholesterolemia    She declines cholesterol medication.  Low cholesterol diet and exercise.  Follow lipid panel.        Hypertension    She is still not taking her blood pressure medication regularly.  Discussed the need to take her medication on a regular basis.  Follow.        Hypothyroidism    On thyroid replacement.  Follow tsh.        Renal lesion    Found on screening CT.  Agreed to renal ultrasound.  Schedule.         Other Visit Diagnoses    Dysuria    -  Primary   Relevant Orders   Urine Culture (Completed)   Urinalysis, Routine w reflex microscopic (Completed)   Right lower quadrant pain       start with renal ultrasound.  pending results, may need further evalaution.  check urine to confirm no infection.     Relevant Orders   US Renal       Einar Pheasant, MD

## 2016-06-11 LAB — URINALYSIS, ROUTINE W REFLEX MICROSCOPIC
Bilirubin Urine: NEGATIVE
Glucose, UA: NEGATIVE
HGB URINE DIPSTICK: NEGATIVE
KETONES UR: NEGATIVE
Leukocytes, UA: NEGATIVE
NITRITE: NEGATIVE
Protein, ur: NEGATIVE
Specific Gravity, Urine: 1.007 (ref 1.001–1.035)
pH: 5.5 (ref 5.0–8.0)

## 2016-06-12 LAB — URINE CULTURE

## 2016-06-13 ENCOUNTER — Encounter: Payer: Self-pay | Admitting: Internal Medicine

## 2016-06-19 ENCOUNTER — Encounter: Payer: Self-pay | Admitting: Internal Medicine

## 2016-06-19 NOTE — Assessment & Plan Note (Signed)
She is still not taking her blood pressure medication regularly.  Discussed the need to take her medication on a regular basis.  Follow.

## 2016-06-19 NOTE — Assessment & Plan Note (Signed)
On thyroid replacement.  Follow tsh.  

## 2016-06-19 NOTE — Assessment & Plan Note (Signed)
Poorly controlled.  Unable to increase metformin secondary to intolerance.  Start glipizide.  Check sugars.  Low carb diet and exercise.  Follow met b and a1c.

## 2016-06-19 NOTE — Assessment & Plan Note (Signed)
Found on screening CT.  Agreed to renal ultrasound.  Schedule.

## 2016-06-19 NOTE — Assessment & Plan Note (Signed)
Declines cholesterol medication.   

## 2016-06-19 NOTE — Assessment & Plan Note (Signed)
She declines cholesterol medication.  Low cholesterol diet and exercise.  Follow lipid panel.   

## 2016-06-25 ENCOUNTER — Encounter: Payer: Self-pay | Admitting: Internal Medicine

## 2016-06-27 ENCOUNTER — Ambulatory Visit
Admission: RE | Admit: 2016-06-27 | Discharge: 2016-06-27 | Disposition: A | Discharge status: Home / Self Care | Payer: Medicare Other | Source: Ambulatory Visit | Attending: Oncology | Admitting: Oncology

## 2016-06-27 ENCOUNTER — Ambulatory Visit
Admission: RE | Admit: 2016-06-27 | Discharge: 2016-06-27 | Disposition: A | Discharge status: Home / Self Care | Payer: Medicare Other | Source: Ambulatory Visit | Attending: Internal Medicine | Admitting: Internal Medicine

## 2016-06-27 DIAGNOSIS — Z87891 Personal history of nicotine dependence: Secondary | ICD-10-CM | POA: Diagnosis not present

## 2016-06-27 DIAGNOSIS — N289 Disorder of kidney and ureter, unspecified: Secondary | ICD-10-CM | POA: Insufficient documentation

## 2016-06-27 DIAGNOSIS — I2510 Atherosclerotic heart disease of native coronary artery without angina pectoris: Secondary | ICD-10-CM | POA: Insufficient documentation

## 2016-06-27 DIAGNOSIS — N1330 Unspecified hydronephrosis: Secondary | ICD-10-CM | POA: Insufficient documentation

## 2016-06-27 DIAGNOSIS — I700 Atherosclerosis of aorta: Secondary | ICD-10-CM | POA: Diagnosis not present

## 2016-06-27 DIAGNOSIS — R1031 Right lower quadrant pain: Secondary | ICD-10-CM

## 2016-06-27 DIAGNOSIS — R93421 Abnormal radiologic findings on diagnostic imaging of right kidney: Secondary | ICD-10-CM | POA: Insufficient documentation

## 2016-06-28 MED ORDER — GLUCOSE BLOOD VI STRP: ORAL_STRIP | 12 refills | Status: DC

## 2016-07-06 ENCOUNTER — Encounter: Payer: Self-pay | Admitting: *Deleted

## 2016-07-07 ENCOUNTER — Telehealth: Payer: Self-pay | Admitting: *Deleted

## 2016-07-07 ENCOUNTER — Encounter: Payer: Self-pay | Admitting: *Deleted

## 2016-07-07 DIAGNOSIS — N1330 Unspecified hydronephrosis: Secondary | ICD-10-CM

## 2016-07-07 NOTE — Telephone Encounter (Signed)
I have placed order for met b.  Also, when she comes in for lab, it appears that Joellen has not been able to reach her to discuss urology referral.  Will you let Joellen know when pt arrives so can inform her of info in recent ultrasound result note.  Thanks

## 2016-07-07 NOTE — Telephone Encounter (Signed)
Pt coming in tomorrow to recheck BMP. Please place future order (since I am unsure of what dx to use).  Thanks

## 2016-07-08 ENCOUNTER — Other Ambulatory Visit (INDEPENDENT_AMBULATORY_CARE_PROVIDER_SITE_OTHER): Payer: Medicare Other

## 2016-07-08 DIAGNOSIS — N1330 Unspecified hydronephrosis: Secondary | ICD-10-CM

## 2016-07-08 LAB — BASIC METABOLIC PANEL
BUN: 18 mg/dL (ref 6–23)
CALCIUM: 9.8 mg/dL (ref 8.4–10.5)
CO2: 27 meq/L (ref 19–32)
CREATININE: 1.03 mg/dL (ref 0.40–1.20)
Chloride: 105 mEq/L (ref 96–112)
GFR: 56.2 mL/min — ABNORMAL LOW (ref >60.00)
GLUCOSE: 138 mg/dL — AB (ref 70–99)
Potassium: 4.8 mEq/L (ref 3.5–5.1)
Sodium: 138 mEq/L (ref 135–145)

## 2016-07-08 NOTE — Telephone Encounter (Signed)
Pt notified of results & agreed to urology referral (see lab result)

## 2016-07-09 ENCOUNTER — Other Ambulatory Visit: Payer: Self-pay | Admitting: Internal Medicine

## 2016-07-09 ENCOUNTER — Encounter: Payer: Self-pay | Admitting: Internal Medicine

## 2016-07-09 DIAGNOSIS — N1330 Unspecified hydronephrosis: Secondary | ICD-10-CM

## 2016-07-09 NOTE — Progress Notes (Signed)
Order placed for urology referral.  

## 2016-08-15 DIAGNOSIS — N1330 Unspecified hydronephrosis: Secondary | ICD-10-CM | POA: Diagnosis not present

## 2016-08-16 ENCOUNTER — Encounter: Payer: Self-pay | Admitting: Internal Medicine

## 2016-08-16 ENCOUNTER — Ambulatory Visit (INDEPENDENT_AMBULATORY_CARE_PROVIDER_SITE_OTHER): Payer: Medicare Other | Admitting: Internal Medicine

## 2016-08-16 DIAGNOSIS — K2100 Gastro-esophageal reflux disease with esophagitis, without bleeding: Secondary | ICD-10-CM

## 2016-08-16 DIAGNOSIS — N289 Disorder of kidney and ureter, unspecified: Secondary | ICD-10-CM | POA: Diagnosis not present

## 2016-08-16 DIAGNOSIS — E039 Hypothyroidism, unspecified: Secondary | ICD-10-CM | POA: Diagnosis not present

## 2016-08-16 DIAGNOSIS — I700 Atherosclerosis of aorta: Secondary | ICD-10-CM

## 2016-08-16 DIAGNOSIS — I10 Essential (primary) hypertension: Secondary | ICD-10-CM | POA: Diagnosis not present

## 2016-08-16 DIAGNOSIS — E7800 Pure hypercholesterolemia, unspecified: Secondary | ICD-10-CM | POA: Diagnosis not present

## 2016-08-16 DIAGNOSIS — E119 Type 2 diabetes mellitus without complications: Secondary | ICD-10-CM | POA: Diagnosis not present

## 2016-08-16 DIAGNOSIS — R0989 Other specified symptoms and signs involving the circulatory and respiratory systems: Secondary | ICD-10-CM

## 2016-08-16 NOTE — Progress Notes (Signed)
Patient ID: Ann Collins, female   DOB: 12/21/1945, 71 y.o.   MRN: [id number]   Subjective:    Patient ID: Ann Collins, female    DOB: July 21, 1945, 71 y.o.   MRN: [id number]  HPI  Patient here for a scheduled follow up.  She reports she is doing relatively well.  Still with increased stress with family issues, but overall she feels she is doing well.  Tries to stay active.  No chest pain.  No sob.  No acid reflux.  No abdominal pain.  Bowels moving.  States she is doing better taking her medications.  She does still skip days with her blood pressure medication.  Refuses cholesterol medication. Discussed with her today.  States sugars averaging 130s in the am and 110 in the pm.     Past Medical History:  Diagnosis Date  . Diabetes mellitus ([place])   . Diverticulosis   . GERD (gastroesophageal reflux disease)   . Hematuria   . Hypercholesterolemia   . Hypertension   . Hypothyroidism   . Kidney stones    Past Surgical History:  Procedure Laterality Date  . APPENDECTOMY    . BREAST BIOPSY  1969   right  . CHOLECYSTECTOMY  2005   Family History  Problem Relation Age of Onset  . Brain cancer Mother        history of brain tumor  . Lung disease Father   . Diabetes Brother        x2  . Breast cancer Neg Hx   . Colon cancer Neg Hx    Social History   Social History  . Marital status: Married    Spouse name: N/A  . Number of children: N/A  . Years of education: N/A   Social History Main Topics  . Smoking status: Current Every Day Smoker    Packs/day: 1.00    Years: 50.00  . Smokeless tobacco: Never Used  . Alcohol use No  . Drug use: No  . Sexual activity: Not Asked   Other Topics Concern  . None   Social History Narrative  . None    Outpatient Encounter Prescriptions as of 08/16/2016  Medication Sig  . glipiZIDE (GLUCOTROL XL) 2.5 MG 24 hr tablet Take 1 tablet (2.5 mg total) by mouth daily with breakfast.  . glucose blood test strip Use as instructed to  blood sugar twice daily  . levothyroxine (SYNTHROID, LEVOTHROID) 100 MCG tablet take 1 tablet by mouth once daily  . lisinopril (PRINIVIL,ZESTRIL) 10 MG tablet Take 1 tablet (10 mg total) by mouth daily.  . metFORMIN (GLUCOPHAGE-XR) 500 MG 24 hr tablet take 1 tablet by mouth twice a day with meal  . pantoprazole (PROTONIX) 40 MG tablet take 1 tablet by mouth twice a day before meals   No facility-administered encounter medications on file as of 08/16/2016.     Review of Systems  Constitutional: Negative for appetite change and unexpected weight change.  HENT: Negative for congestion and sinus pressure.   Respiratory: Negative for cough, chest tightness and shortness of breath.   Cardiovascular: Negative for chest pain, palpitations and leg swelling.  Gastrointestinal: Negative for abdominal pain, diarrhea, nausea and vomiting.  Genitourinary: Negative for difficulty urinating and dysuria.  Musculoskeletal: Negative for back pain and joint swelling.  Skin: Negative for color change and rash.  Neurological: Negative for dizziness, light-headedness and headaches.  Psychiatric/Behavioral: Negative for agitation and dysphoric mood.       Objective:  Blood pressure rechecked by me:  140/72  Physical Exam  Constitutional: She appears well-developed and well-nourished. No distress.  HENT:  Nose: Nose normal.  Mouth/Throat: Oropharynx is clear and moist.  Neck: Neck supple. No thyromegaly present.  Cardiovascular: Normal rate and regular rhythm.   Pulmonary/Chest: Breath sounds normal. No respiratory distress. She has no wheezes.  Abdominal: Soft. Bowel sounds are normal. There is no tenderness.  Musculoskeletal: She exhibits no edema or tenderness.  Lymphadenopathy:    She has no cervical adenopathy.  Skin: No rash noted. No erythema.  Psychiatric: She has a normal mood and affect. Her behavior is normal.    BP 140/72   Pulse 72   Temp 98.6 F (37 C) (Oral)   Resp 12   Ht 5'  2" (1.575 m)   Wt 154 lb 6.4 oz (70 kg)   LMP 03/10/1991   SpO2 96%   BMI 28.24 kg/m  Wt Readings from Last 3 Encounters:  08/16/16 154 lb 6.4 oz (70 kg)  06/27/16 154 lb (69.9 kg)  06/10/16 152 lb 12.8 oz (69.3 kg)     Lab Results  Component Value Date   WBC 8.8 11/20/2015   HGB 14.0 11/20/2015   HCT 42.5 11/20/2015   PLT 174.0 11/20/2015   GLUCOSE 138 (H) 07/08/2016   CHOL 247 (H) 04/08/2016   TRIG 206.0 (H) 04/08/2016   HDL 44.30 04/08/2016   LDLDIRECT 145.0 04/08/2016   LDLCALC 102 (H) 11/20/2015   ALT 13 04/08/2016   AST 13 04/08/2016   NA 138 07/08/2016   K 4.8 07/08/2016   CL 105 07/08/2016   CREATININE 1.03 07/08/2016   BUN 18 07/08/2016   CO2 27 07/08/2016   TSH 1.84 11/20/2015   HGBA1C 8.4 (H) 04/08/2016   MICROALBUR <0.7 03/27/2015    US Renal  Result Date: 06/27/2016 CLINICAL DATA:  Left upper pole renal lesion identified at chest CT 06/26/2015. EXAM: RENAL / URINARY TRACT ULTRASOUND COMPLETE COMPARISON:  CT of the chest 06/26/2015. CTA of the chest 06/27/2016 FINDINGS: Right Kidney: Length: 10.2 cm, within normal limits. Echogenicity within normal limits. Mild right-sided hydronephrosis is present. A 5 mm echogenic structure in the upper pole may represent a nonobstructing stone. Left Kidney: Length: 9.1 cm, within normal limits. Echogenicity within normal limits. Multiple simple cysts are present. The lesion at the upper pole of the left kidney measures 2.1 x 2.0 x 2.2 cm, a simple cyst. A lower pole cyst measures 1.6 x 1.3 x 1.7 cm. Two subcentimeter cysts are present as well. Bladder: Appears normal for degree of bladder distention. IMPRESSION: 1. Exophytic lesions of the left kidney are compatible with simple cysts. 2. Mild right-sided hydronephrosis. 3. Question nonobstructing stone at the upper pole of the right kidney. Electronically Signed   By: San Morelle M.D.   On: 06/27/2016 17:06   Ct Chest Lung Cancer Screening Low Dose Wo  Contrast  Result Date: 06/28/2016 CLINICAL DATA:  70 year old female current smoker with 51 pack-year history of smoking. Lung cancer screening examination. EXAM: CT CHEST WITHOUT CONTRAST LOW-DOSE FOR LUNG CANCER SCREENING TECHNIQUE: Multidetector CT imaging of the chest was performed following the standard protocol without IV contrast. COMPARISON:  Low-dose lung cancer screening chest CT 06/26/2015. FINDINGS: Cardiovascular: Heart size is normal. There is no significant pericardial fluid, thickening or pericardial calcification. There is aortic atherosclerosis, as well as atherosclerosis of the great vessels of the mediastinum and the coronary arteries, including calcified atherosclerotic plaque in the left anterior descending and  right coronary arteries. Mediastinum/Nodes: No pathologically enlarged mediastinal or hilar lymph nodes. Please note that accurate exclusion of hilar adenopathy is limited on noncontrast CT scans. Esophagus is unremarkable in appearance. No axillary lymphadenopathy. Lungs/Pleura: Several small pulmonary nodules are noted in the lungs, the largest of which is in the left lower lobe (axial image 156 of series 3) with a volume derived mean diameter of only 3.3 mm. No larger more suspicious appearing pulmonary nodules or masses are noted. No acute consolidative airspace disease. No pleural effusions. Upper Abdomen: Left kidney is atrophic with multiple exophytic lesions which are low-attenuation, and incompletely characterized on today's noncontrast CT examination, but favored to represent cysts, measuring up to 2.4 cm in the upper pole. Status post cholecystectomy. Aortic atherosclerosis. Musculoskeletal: There are no aggressive appearing lytic or blastic lesions noted in the visualized portions of the skeleton. IMPRESSION: 1. Lung-RADS 2S, benign appearance or behavior. Continue annual screening with low-dose chest CT without contrast in 12 months. 2. The "S" modifier above refers to  potentially clinically significant non lung cancer related findings. Specifically, there is aortic atherosclerosis, in addition to 2 vessel coronary artery disease. Please note that although the presence of coronary artery calcium documents the presence of coronary artery disease, the severity of this disease and any potential stenosis cannot be assessed on this non-gated CT examination. Assessment for potential risk factor modification, dietary therapy or pharmacologic therapy may be warranted, if clinically indicated. Electronically Signed   By: Vinnie Langton M.D.   On: 06/28/2016 09:12       Assessment & Plan:   Problem List Items Addressed This Visit    Aortic atherosclerosis ([place])    Declines cholesterol medication.        Diabetes mellitus ([place])    Low carb diet and exercise.  Taking her medication regularly.  Sugars as outlined.  Follow met b and a1c.       Relevant Orders   Hemoglobin A1c   Microalbumin / creatinine urine ratio   GERD (gastroesophageal reflux disease)    Controlled on protonix.        Hypercholesterolemia    Declines cholesterol medication.  Discussed diet and exercise.  She declines medication.        Relevant Orders   Hepatic function panel   Lipid panel   Hypertension    Blood pressure as outlined.  Discussed with her regarding the need to take lisinopril on a regular basis.  Follow pressures.  followmetabolic panel.        Relevant Orders   Basic metabolic panel   Hypothyroidism    On thyroid replacement.  Follow tsh.       Left carotid bruit    Has declined f/u ultrasound.        Renal lesion    Saw urology.  Planning for CT scan.            Einar Pheasant, MD

## 2016-08-16 NOTE — Progress Notes (Signed)
Pre-visit discussion using our clinic review tool. No additional management support is needed unless otherwise documented below in the visit note.  

## 2016-08-17 ENCOUNTER — Encounter: Payer: Self-pay | Admitting: Internal Medicine

## 2016-08-17 NOTE — Assessment & Plan Note (Signed)
Declines cholesterol medication.   

## 2016-08-17 NOTE — Assessment & Plan Note (Signed)
Declines cholesterol medication.  Discussed diet and exercise.  She declines medication.

## 2016-08-17 NOTE — Assessment & Plan Note (Signed)
Blood pressure as outlined.  Discussed with her regarding the need to take lisinopril on a regular basis.  Follow pressures.  followmetabolic panel.

## 2016-08-17 NOTE — Assessment & Plan Note (Signed)
Saw urology.  Planning for CT scan.

## 2016-08-17 NOTE — Assessment & Plan Note (Signed)
On thyroid replacement.  Follow tsh.  

## 2016-08-17 NOTE — Assessment & Plan Note (Signed)
Controlled on protonix.   

## 2016-08-17 NOTE — Assessment & Plan Note (Signed)
Low carb diet and exercise.  Taking her medication regularly.  Sugars as outlined.  Follow met b and a1c.

## 2016-08-17 NOTE — Assessment & Plan Note (Signed)
Has declined f/u ultrasound.

## 2016-08-24 DIAGNOSIS — E279 Disorder of adrenal gland, unspecified: Secondary | ICD-10-CM | POA: Diagnosis not present

## 2016-08-24 DIAGNOSIS — E278 Other specified disorders of adrenal gland: Secondary | ICD-10-CM | POA: Diagnosis not present

## 2016-08-24 DIAGNOSIS — N1330 Unspecified hydronephrosis: Secondary | ICD-10-CM | POA: Diagnosis not present

## 2016-08-26 ENCOUNTER — Encounter: Payer: Self-pay | Admitting: Internal Medicine

## 2016-08-28 ENCOUNTER — Other Ambulatory Visit: Payer: Self-pay | Admitting: Internal Medicine

## 2016-08-28 DIAGNOSIS — E119 Type 2 diabetes mellitus without complications: Secondary | ICD-10-CM

## 2016-08-28 NOTE — Progress Notes (Signed)
Order placed for f/u creatinine.

## 2016-08-28 NOTE — Telephone Encounter (Signed)
Called pt.  She has been off her metformin.  Needs a creatinine checked tomorrow.  Pt coming in at 3:30.  Please place on lab schedule and let them know she only needs the creatinine checked.  There are other labs ordered.  Thanks

## 2016-08-29 ENCOUNTER — Other Ambulatory Visit (INDEPENDENT_AMBULATORY_CARE_PROVIDER_SITE_OTHER): Payer: Medicare Other

## 2016-08-29 DIAGNOSIS — I10 Essential (primary) hypertension: Secondary | ICD-10-CM

## 2016-08-29 DIAGNOSIS — E7800 Pure hypercholesterolemia, unspecified: Secondary | ICD-10-CM | POA: Diagnosis not present

## 2016-08-29 DIAGNOSIS — E119 Type 2 diabetes mellitus without complications: Secondary | ICD-10-CM | POA: Diagnosis not present

## 2016-08-29 NOTE — Telephone Encounter (Signed)
Lab app made note put in to only do creatinine

## 2016-08-30 LAB — BASIC METABOLIC PANEL
BUN: 21 mg/dL (ref 6–23)
CO2: 28 meq/L (ref 19–32)
Calcium: 10.3 mg/dL (ref 8.4–10.5)
Chloride: 100 mEq/L (ref 96–112)
Creatinine, Ser: 1.06 mg/dL (ref 0.40–1.20)
GFR: 54.35 mL/min — ABNORMAL LOW (ref >60.00)
GLUCOSE: 129 mg/dL — AB (ref 70–99)
Potassium: 5.1 mEq/L (ref 3.5–5.1)
Sodium: 135 mEq/L (ref 135–145)

## 2016-08-30 LAB — HEMOGLOBIN A1C: Hgb A1c MFr Bld: 8.1 % — ABNORMAL HIGH (ref 4.6–6.5)

## 2016-08-30 LAB — LIPID PANEL
CHOLESTEROL: 254 mg/dL — AB (ref 0–200)
HDL: 40.6 mg/dL (ref >39.00)
NONHDL: 213.49
Total CHOL/HDL Ratio: 6
Triglycerides: 331 mg/dL — ABNORMAL HIGH (ref 0.0–149.0)
VLDL: 66.2 mg/dL — ABNORMAL HIGH (ref 0.0–40.0)

## 2016-08-30 LAB — HEPATIC FUNCTION PANEL
ALBUMIN: 4.1 g/dL (ref 3.5–5.2)
ALT: 14 U/L (ref 0–35)
AST: 14 U/L (ref 0–37)
Alkaline Phosphatase: 105 U/L (ref 39–117)
BILIRUBIN DIRECT: 0 mg/dL (ref 0.0–0.3)
TOTAL PROTEIN: 7 g/dL (ref 6.0–8.3)
Total Bilirubin: 0.3 mg/dL (ref 0.2–1.2)

## 2016-08-30 LAB — MICROALBUMIN / CREATININE URINE RATIO
CREATININE, U: 29.7 mg/dL
Microalb Creat Ratio: 2.4 mg/g (ref 0.0–30.0)
Microalb, Ur: <0.7 mg/dL (ref 0.0–1.9)

## 2016-08-30 LAB — LDL CHOLESTEROL, DIRECT: LDL DIRECT: 156 mg/dL

## 2016-08-30 LAB — CREATININE, SERUM: Creatinine, Ser: 1.06 mg/dL (ref 0.40–1.20)

## 2016-09-05 ENCOUNTER — Telehealth: Payer: Self-pay | Admitting: Internal Medicine

## 2016-09-05 NOTE — Telephone Encounter (Signed)
Pt called back returning your call. Please advise, thank you!  Call pt @ [phone]

## 2016-09-05 NOTE — Telephone Encounter (Signed)
See lab results.  

## 2016-09-06 NOTE — Telephone Encounter (Signed)
See lab results.  

## 2016-09-06 NOTE — Telephone Encounter (Signed)
Pt called back returning your call. Pt states that she works from [employer] Please advise, thank you!  Call pt @ [phone]

## 2016-09-13 ENCOUNTER — Telehealth: Payer: Self-pay | Admitting: Internal Medicine

## 2016-09-13 NOTE — Telephone Encounter (Signed)
Patient has app for labs. Do not see orders think it was if she started medication but she dd not start. Can we c/a app?

## 2016-09-13 NOTE — Telephone Encounter (Signed)
Pt called and left a vm asking if she needed to come in for lab work on Friday. Please advise, thank you!  Call pt @ [phone]

## 2016-09-14 NOTE — Telephone Encounter (Signed)
Left message to return call to our office.  

## 2016-09-14 NOTE — Telephone Encounter (Signed)
Patient called gave information at 3:30

## 2016-09-14 NOTE — Telephone Encounter (Signed)
Notify her that I would like for her to have a lab check - just to recheck her potassium - to make sure wnl.  Last check upper limits of normal.  Let me know where she wants to get drawn and I will place the order.

## 2016-09-15 ENCOUNTER — Telehealth: Payer: Self-pay | Admitting: Radiology

## 2016-09-15 NOTE — Telephone Encounter (Signed)
Pt coming in for labs tomorrow, please place future orders. Thank you.  

## 2016-09-15 NOTE — Telephone Encounter (Signed)
See previous message.  Need to know where she wants to get lab drawn.

## 2016-09-15 NOTE — Telephone Encounter (Signed)
Pt coming to our office to have labs done.

## 2016-09-16 ENCOUNTER — Encounter: Payer: Self-pay | Admitting: Internal Medicine

## 2016-09-16 ENCOUNTER — Other Ambulatory Visit (INDEPENDENT_AMBULATORY_CARE_PROVIDER_SITE_OTHER): Payer: Medicare Other

## 2016-09-16 ENCOUNTER — Other Ambulatory Visit: Payer: Self-pay | Admitting: Internal Medicine

## 2016-09-16 DIAGNOSIS — E875 Hyperkalemia: Secondary | ICD-10-CM

## 2016-09-16 LAB — POTASSIUM: Potassium: 4.7 mEq/L (ref 3.5–5.1)

## 2016-09-16 NOTE — Telephone Encounter (Signed)
Order placed for f/u potassium.  

## 2016-09-16 NOTE — Progress Notes (Signed)
Order placed for f/u potassium.  

## 2016-09-16 NOTE — Telephone Encounter (Signed)
Per CMA pt coming to our office for labs

## 2016-10-04 DIAGNOSIS — C44722 Squamous cell carcinoma of skin of right lower limb, including hip: Secondary | ICD-10-CM | POA: Diagnosis not present

## 2016-10-06 ENCOUNTER — Other Ambulatory Visit: Payer: Self-pay | Admitting: Internal Medicine

## 2016-11-08 DIAGNOSIS — C4492 Squamous cell carcinoma of skin, unspecified: Secondary | ICD-10-CM | POA: Diagnosis not present

## 2016-11-28 ENCOUNTER — Encounter: Payer: Medicare Other | Admitting: Internal Medicine

## 2016-12-06 DIAGNOSIS — L858 Other specified epidermal thickening: Secondary | ICD-10-CM | POA: Diagnosis not present

## 2016-12-06 LAB — HEPATIC FUNCTION PANEL
ALK PHOS: 106 (ref 25–125)
ALT: 14 (ref 7–35)
AST: 12 — AB (ref 13–35)
BILIRUBIN, TOTAL: 0.2

## 2016-12-06 LAB — LIPID PANEL
CHOLESTEROL: 248 — AB (ref 0–200)
HDL: 43 (ref 35–70)
LDL Cholesterol: 148
TRIGLYCERIDES: 285 — AB (ref 40–160)

## 2016-12-06 LAB — BASIC METABOLIC PANEL
BUN: 19 (ref 4–21)
CREATININE: 1 (ref 0.5–1.1)
Glucose: 154
Potassium: 4.6 (ref 3.4–5.3)
Sodium: 140 (ref 137–147)

## 2016-12-06 LAB — CBC AND DIFFERENTIAL
HCT: 41 (ref 36–46)
HEMOGLOBIN: 13.7 (ref 12.0–16.0)
NEUTROS ABS: 5
PLATELETS: 189 (ref 150–399)
WBC: 9.7

## 2016-12-06 LAB — TSH: TSH: 1.26 (ref 0.41–5.90)

## 2016-12-06 LAB — HEMOGLOBIN A1C: Hemoglobin A1C: 7.4

## 2016-12-09 ENCOUNTER — Encounter: Payer: Self-pay | Admitting: Internal Medicine

## 2016-12-09 ENCOUNTER — Ambulatory Visit (INDEPENDENT_AMBULATORY_CARE_PROVIDER_SITE_OTHER): Payer: Medicare Other | Admitting: Internal Medicine

## 2016-12-09 DIAGNOSIS — N289 Disorder of kidney and ureter, unspecified: Secondary | ICD-10-CM

## 2016-12-09 DIAGNOSIS — Z0000 Encounter for general adult medical examination without abnormal findings: Secondary | ICD-10-CM | POA: Diagnosis not present

## 2016-12-09 DIAGNOSIS — E039 Hypothyroidism, unspecified: Secondary | ICD-10-CM

## 2016-12-09 DIAGNOSIS — K2100 Gastro-esophageal reflux disease with esophagitis, without bleeding: Secondary | ICD-10-CM

## 2016-12-09 DIAGNOSIS — I10 Essential (primary) hypertension: Secondary | ICD-10-CM

## 2016-12-09 DIAGNOSIS — E119 Type 2 diabetes mellitus without complications: Secondary | ICD-10-CM

## 2016-12-09 DIAGNOSIS — I700 Atherosclerosis of aorta: Secondary | ICD-10-CM | POA: Diagnosis not present

## 2016-12-09 DIAGNOSIS — E7800 Pure hypercholesterolemia, unspecified: Secondary | ICD-10-CM | POA: Diagnosis not present

## 2016-12-09 NOTE — Progress Notes (Signed)
Patient ID: Ann Collins, female   DOB: 06/06/1945, 71 y.o.   MRN: [id number]   Subjective:    Patient ID: Ann Collins, female    DOB: 01/12/1946, 71 y.o.   MRN: [id number]  HPI  Patient with past history of diabetes, GERD, hypertension and hypercholesterolemia.  She comes in today to follow up on these issues as well as for a complete physical exam.  She reports she is doing relatively well.  Still with increased stress.  Discussed with her today.  She does not feel needs anything more at this time.  Stays active.  Working.  No chest pain.  No sob.  No acid reflux.  No abdominal pain.  Bowels moving.  States taking her diabetic medications.  Does skip her blood pressure medication.  States has to space out her meds and sometimes will forget to take her bp med.  Discussed with her regarding the importance of taking her medication regularly.     Past Medical History:  Diagnosis Date  . Diabetes mellitus (HCC)   . Diverticulosis   . GERD (gastroesophageal reflux disease)   . Hematuria   . Hypercholesterolemia   . Hypertension   . Hypothyroidism   . Kidney stones    Past Surgical History:  Procedure Laterality Date  . APPENDECTOMY    . BREAST BIOPSY  1969   right  . CHOLECYSTECTOMY  2005   Family History  Problem Relation Age of Onset  . Brain cancer Mother        history of brain tumor  . Lung disease Father   . Diabetes Brother        x2  . Breast cancer Neg Hx   . Colon cancer Neg Hx    Social History   Socioeconomic History  . Marital status: Married    Spouse name: None  . Number of children: None  . Years of education: None  . Highest education level: None  Social Needs  . Financial resource strain: None  . Food insecurity - worry: None  . Food insecurity - inability: None  . Transportation needs - medical: None  . Transportation needs - non-medical: None  Occupational History  . None  Tobacco Use  . Smoking status: Current Every Day Smoker   Packs/day: 1.00    Years: 50.00    Pack years: 50.00  . Smokeless tobacco: Never Used  Substance and Sexual Activity  . Alcohol use: No    Alcohol/week: 0.0 oz  . Drug use: No  . Sexual activity: None  Other Topics Concern  . None  Social History Narrative  . None    Outpatient Encounter Medications as of 12/09/2016  Medication Sig  . glipiZIDE (GLUCOTROL XL) 2.5 MG 24 hr tablet take 1 tablet by mouth once daily with BREAKFAST  . glucose blood test strip Use as instructed to blood sugar twice daily  . levothyroxine (SYNTHROID, LEVOTHROID) 100 MCG tablet take 1 tablet by mouth once daily  . lisinopril (PRINIVIL,ZESTRIL) 10 MG tablet Take 1 tablet (10 mg total) by mouth daily.  . metFORMIN (GLUCOPHAGE-XR) 500 MG 24 hr tablet take 1 tablet by mouth twice a day with meal  . pantoprazole (PROTONIX) 40 MG tablet take 1 tablet by mouth twice a day before meals   No facility-administered encounter medications on file as of 12/09/2016.     Review of Systems  Constitutional: Negative for appetite change and unexpected weight change.  HENT: Negative for congestion and sinus pressure.     Eyes: Negative for pain and visual disturbance.  Respiratory: Negative for cough, chest tightness and shortness of breath.   Cardiovascular: Negative for chest pain, palpitations and leg swelling.  Gastrointestinal: Negative for abdominal pain, diarrhea, nausea and vomiting.  Genitourinary: Negative for difficulty urinating and dysuria.  Musculoskeletal: Negative for joint swelling and myalgias.  Skin: Negative for color change and rash.  Neurological: Negative for dizziness, light-headedness and headaches.  Hematological: Negative for adenopathy. Does not bruise/bleed easily.  Psychiatric/Behavioral: Negative for agitation and dysphoric mood.       Objective:    Physical Exam  Constitutional: She is oriented to person, place, and time. She appears well-developed and well-nourished. No distress.    HENT:  Nose: Nose normal.  Mouth/Throat: Oropharynx is clear and moist.  Eyes: Right eye exhibits no discharge. Left eye exhibits no discharge. No scleral icterus.  Neck: Neck supple. No thyromegaly present.  Cardiovascular: Normal rate and regular rhythm.  Pulmonary/Chest: Breath sounds normal. No accessory muscle usage. No tachypnea. No respiratory distress. She has no decreased breath sounds. She has no wheezes. She has no rhonchi. Right breast exhibits no inverted nipple, no mass, no nipple discharge and no tenderness (no axillary adenopathy). Left breast exhibits no inverted nipple, no mass, no nipple discharge and no tenderness (no axilarry adenopathy).  Abdominal: Soft. Bowel sounds are normal. There is no tenderness.  Musculoskeletal: She exhibits no edema or tenderness.  Lymphadenopathy:    She has no cervical adenopathy.  Neurological: She is alert and oriented to person, place, and time.  Skin: Skin is warm. No rash noted. No erythema.  Psychiatric: She has a normal mood and affect. Her behavior is normal.    BP 140/80 (BP Location: Left Arm, Patient Position: Sitting, Cuff Size: Normal)   Pulse 68   Temp 98.2 F (36.8 C) (Oral)   Ht 5' 2" (1.575 m)   Wt 151 lb 9.6 oz (68.8 kg)   LMP 03/10/1991   SpO2 95%   BMI 27.73 kg/m  Wt Readings from Last 3 Encounters:  12/09/16 151 lb 9.6 oz (68.8 kg)  08/16/16 154 lb 6.4 oz (70 kg)  06/27/16 154 lb (69.9 kg)     Lab Results  Component Value Date   WBC 8.8 11/20/2015   HGB 14.0 11/20/2015   HCT 42.5 11/20/2015   PLT 174.0 11/20/2015   GLUCOSE 129 (H) 08/29/2016   CHOL 254 (H) 08/29/2016   TRIG 331.0 (H) 08/29/2016   HDL 40.60 08/29/2016   LDLDIRECT 156.0 08/29/2016   LDLCALC 102 (H) 11/20/2015   ALT 14 08/29/2016   AST 14 08/29/2016   NA 135 08/29/2016   K 4.7 09/16/2016   CL 100 08/29/2016   CREATININE 1.06 08/29/2016   CREATININE 1.06 08/29/2016   BUN 21 08/29/2016   CO2 28 08/29/2016   TSH 1.84 11/20/2015    HGBA1C 8.1 (H) 08/29/2016   MICROALBUR <0.7 08/29/2016    US Renal  Result Date: 06/27/2016 CLINICAL DATA:  Left upper pole renal lesion identified at chest CT 06/26/2015. EXAM: RENAL / URINARY TRACT ULTRASOUND COMPLETE COMPARISON:  CT of the chest 06/26/2015. CTA of the chest 06/27/2016 FINDINGS: Right Kidney: Length: 10.2 cm, within normal limits. Echogenicity within normal limits. Mild right-sided hydronephrosis is present. A 5 mm echogenic structure in the upper pole may represent a nonobstructing stone. Left Kidney: Length: 9.1 cm, within normal limits. Echogenicity within normal limits. Multiple simple cysts are present. The lesion at the upper pole of the left kidney measures 2.1  x 2.0 x 2.2 cm, a simple cyst. A lower pole cyst measures 1.6 x 1.3 x 1.7 cm. Two subcentimeter cysts are present as well. Bladder: Appears normal for degree of bladder distention. IMPRESSION: 1. Exophytic lesions of the left kidney are compatible with simple cysts. 2. Mild right-sided hydronephrosis. 3. Question nonobstructing stone at the upper pole of the right kidney. Electronically Signed   By: Christopher  Mattern M.D.   On: 06/27/2016 17:06   Ct Chest Lung Cancer Screening Low Dose Wo Contrast  Result Date: 06/28/2016 CLINICAL DATA:  70-year-old female current smoker with 51 pack-year history of smoking. Lung cancer screening examination. EXAM: CT CHEST WITHOUT CONTRAST LOW-DOSE FOR LUNG CANCER SCREENING TECHNIQUE: Multidetector CT imaging of the chest was performed following the standard protocol without IV contrast. COMPARISON:  Low-dose lung cancer screening chest CT 06/26/2015. FINDINGS: Cardiovascular: Heart size is normal. There is no significant pericardial fluid, thickening or pericardial calcification. There is aortic atherosclerosis, as well as atherosclerosis of the great vessels of the mediastinum and the coronary arteries, including calcified atherosclerotic plaque in the left anterior descending and  right coronary arteries. Mediastinum/Nodes: No pathologically enlarged mediastinal or hilar lymph nodes. Please note that accurate exclusion of hilar adenopathy is limited on noncontrast CT scans. Esophagus is unremarkable in appearance. No axillary lymphadenopathy. Lungs/Pleura: Several small pulmonary nodules are noted in the lungs, the largest of which is in the left lower lobe (axial image 156 of series 3) with a volume derived mean diameter of only 3.3 mm. No larger more suspicious appearing pulmonary nodules or masses are noted. No acute consolidative airspace disease. No pleural effusions. Upper Abdomen: Left kidney is atrophic with multiple exophytic lesions which are low-attenuation, and incompletely characterized on today's noncontrast CT examination, but favored to represent cysts, measuring up to 2.4 cm in the upper pole. Status post cholecystectomy. Aortic atherosclerosis. Musculoskeletal: There are no aggressive appearing lytic or blastic lesions noted in the visualized portions of the skeleton. IMPRESSION: 1. Lung-RADS 2S, benign appearance or behavior. Continue annual screening with low-dose chest CT without contrast in 12 months. 2. The "S" modifier above refers to potentially clinically significant non lung cancer related findings. Specifically, there is aortic atherosclerosis, in addition to 2 vessel coronary artery disease. Please note that although the presence of coronary artery calcium documents the presence of coronary artery disease, the severity of this disease and any potential stenosis cannot be assessed on this non-gated CT examination. Assessment for potential risk factor modification, dietary therapy or pharmacologic therapy may be warranted, if clinically indicated. Electronically Signed   By: Daniel  Entrikin M.D.   On: 06/28/2016 09:12       Assessment & Plan:   Problem List Items Addressed This Visit    Aortic atherosclerosis (HCC)    Declines cholesterol medication.   Follow.        Diabetes mellitus (HCC)    Low carb diet and exercise.  Follow met b and a1c.        GERD (gastroesophageal reflux disease)    Controlled on current regimen.        Health care maintenance    Physical today 12/09/16.  Declines mammogram.  Colonoscopy 12/2008.        Hypercholesterolemia    Low cholesterol diet and exercise.  Declines cholesterol medication.  Follow pressures.  Follow metabolic panel.        Hypertension    Blood pressure elevated.  Not taking her medication regularly.  Discussed importance of compliance with her   medication.  Follow pressures.  Follow metabolic panel.        Hypothyroidism    On thyroid replacement.  Follow tsh.        Renal lesion    Saw urology.  Had CT.  States no further w/up or f/u warranted.            Einar Pheasant, MD

## 2016-12-11 ENCOUNTER — Encounter: Payer: Self-pay | Admitting: Internal Medicine

## 2016-12-11 NOTE — Assessment & Plan Note (Signed)
Blood pressure elevated.  Not taking her medication regularly.  Discussed importance of compliance with her medication.  Follow pressures.  Follow metabolic panel.

## 2016-12-11 NOTE — Assessment & Plan Note (Signed)
Declines cholesterol medication.  Follow.  

## 2016-12-11 NOTE — Assessment & Plan Note (Signed)
Physical today 12/09/16.  Declines mammogram.  Colonoscopy 12/2008.

## 2016-12-11 NOTE — Assessment & Plan Note (Signed)
Saw urology.  Had CT.  States no further w/up or f/u warranted.

## 2016-12-11 NOTE — Assessment & Plan Note (Signed)
Low cholesterol diet and exercise.  Declines cholesterol medication.  Follow pressures.  Follow metabolic panel.

## 2016-12-11 NOTE — Assessment & Plan Note (Signed)
Controlled on current regimen.   

## 2016-12-11 NOTE — Assessment & Plan Note (Signed)
On thyroid replacement.  Follow tsh.  

## 2016-12-11 NOTE — Assessment & Plan Note (Signed)
Low carb diet and exercise.  Follow met b and a1c.   

## 2016-12-13 DIAGNOSIS — E119 Type 2 diabetes mellitus without complications: Secondary | ICD-10-CM | POA: Diagnosis not present

## 2016-12-13 DIAGNOSIS — H52223 Regular astigmatism, bilateral: Secondary | ICD-10-CM | POA: Diagnosis not present

## 2016-12-13 DIAGNOSIS — D3132 Benign neoplasm of left choroid: Secondary | ICD-10-CM | POA: Diagnosis not present

## 2016-12-13 DIAGNOSIS — Z9849 Cataract extraction status, unspecified eye: Secondary | ICD-10-CM | POA: Diagnosis not present

## 2016-12-13 DIAGNOSIS — Z961 Presence of intraocular lens: Secondary | ICD-10-CM | POA: Diagnosis not present

## 2016-12-13 LAB — HM DIABETES EYE EXAM

## 2017-01-02 ENCOUNTER — Encounter: Payer: Self-pay | Admitting: Internal Medicine

## 2017-01-03 ENCOUNTER — Other Ambulatory Visit: Payer: Self-pay | Admitting: Internal Medicine

## 2017-01-04 ENCOUNTER — Other Ambulatory Visit: Payer: Self-pay | Admitting: Internal Medicine

## 2017-03-02 DIAGNOSIS — Z23 Encounter for immunization: Secondary | ICD-10-CM | POA: Diagnosis not present

## 2017-03-13 ENCOUNTER — Encounter: Payer: Self-pay | Admitting: Internal Medicine

## 2017-03-13 ENCOUNTER — Ambulatory Visit (INDEPENDENT_AMBULATORY_CARE_PROVIDER_SITE_OTHER): Payer: Medicare Other | Admitting: Internal Medicine

## 2017-03-13 VITALS — BP 134/68 | HR 61 | Temp 98.8°F | Resp 18 | Wt 148.8 lb

## 2017-03-13 DIAGNOSIS — Z87891 Personal history of nicotine dependence: Secondary | ICD-10-CM | POA: Diagnosis not present

## 2017-03-13 DIAGNOSIS — E7800 Pure hypercholesterolemia, unspecified: Secondary | ICD-10-CM | POA: Diagnosis not present

## 2017-03-13 DIAGNOSIS — I10 Essential (primary) hypertension: Secondary | ICD-10-CM

## 2017-03-13 DIAGNOSIS — K2100 Gastro-esophageal reflux disease with esophagitis, without bleeding: Secondary | ICD-10-CM

## 2017-03-13 DIAGNOSIS — E039 Hypothyroidism, unspecified: Secondary | ICD-10-CM | POA: Diagnosis not present

## 2017-03-13 DIAGNOSIS — I700 Atherosclerosis of aorta: Secondary | ICD-10-CM | POA: Diagnosis not present

## 2017-03-13 DIAGNOSIS — E119 Type 2 diabetes mellitus without complications: Secondary | ICD-10-CM | POA: Diagnosis not present

## 2017-03-13 DIAGNOSIS — Z1231 Encounter for screening mammogram for malignant neoplasm of breast: Secondary | ICD-10-CM

## 2017-03-13 DIAGNOSIS — Z1239 Encounter for other screening for malignant neoplasm of breast: Secondary | ICD-10-CM

## 2017-03-13 LAB — BASIC METABOLIC PANEL
BUN: 21 mg/dL (ref 6–23)
CHLORIDE: 105 meq/L (ref 96–112)
CO2: 25 meq/L (ref 19–32)
Calcium: 9.4 mg/dL (ref 8.4–10.5)
Creatinine, Ser: 1.03 mg/dL (ref 0.40–1.20)
GFR: 56.09 mL/min — ABNORMAL LOW (ref >60.00)
Glucose, Bld: 146 mg/dL — ABNORMAL HIGH (ref 70–99)
POTASSIUM: 4.7 meq/L (ref 3.5–5.1)
Sodium: 138 mEq/L (ref 135–145)

## 2017-03-13 LAB — LIPID PANEL
CHOL/HDL RATIO: 4
Cholesterol: 215 mg/dL — ABNORMAL HIGH (ref 0–200)
HDL: 49.8 mg/dL (ref >39.00)
LDL Cholesterol: 136 mg/dL — ABNORMAL HIGH (ref 0–99)
NONHDL: 165.23
TRIGLYCERIDES: 144 mg/dL (ref 0.0–149.0)
VLDL: 28.8 mg/dL (ref 0.0–40.0)

## 2017-03-13 LAB — HEPATIC FUNCTION PANEL
ALBUMIN: 4 g/dL (ref 3.5–5.2)
ALT: 12 U/L (ref 0–35)
AST: 12 U/L (ref 0–37)
Alkaline Phosphatase: 94 U/L (ref 39–117)
BILIRUBIN DIRECT: 0.1 mg/dL (ref 0.0–0.3)
TOTAL PROTEIN: 7 g/dL (ref 6.0–8.3)
Total Bilirubin: 0.4 mg/dL (ref 0.2–1.2)

## 2017-03-13 LAB — HEMOGLOBIN A1C: Hgb A1c MFr Bld: 7.7 % — ABNORMAL HIGH (ref 4.6–6.5)

## 2017-03-13 NOTE — Progress Notes (Signed)
Patient ID: Ann Collins, female   DOB: 1945/05/15, 72 y.o.   MRN: [id number]   Subjective:    Patient ID: Ann Collins, female    DOB: 1946-02-03, 72 y.o.   MRN: [id number]  HPI  Patient here for a scheduled follow up.  She reports she is doing relatively well.  Still with increased stress (family).  Discussed with her today.  She does not feel she needs anything more at this time.  Stays active.  No chest pain.  She is walking.  Breathing stable. No acid reflux.  No abdominal pain.  Bowels moving.  Did not bring in any sugar readings.  Not checking her sugars recently.  Discussed diet and exercise.  She is not taking her medication regularly.  Discussed importance of compliance with medication.     Past Medical History:  Diagnosis Date  . Diabetes mellitus ([place])   . Diverticulosis   . GERD (gastroesophageal reflux disease)   . Hematuria   . Hypercholesterolemia   . Hypertension   . Hypothyroidism   . Kidney stones    Past Surgical History:  Procedure Laterality Date  . APPENDECTOMY    . BREAST BIOPSY  1969   right  . CHOLECYSTECTOMY  2005   Family History  Problem Relation Age of Onset  . Brain cancer Mother        history of brain tumor  . Lung disease Father   . Diabetes Brother        x2  . Breast cancer Neg Hx   . Colon cancer Neg Hx    Social History   Socioeconomic History  . Marital status: Married    Spouse name: None  . Number of children: None  . Years of education: None  . Highest education level: None  Social Needs  . Financial resource strain: None  . Food insecurity - worry: None  . Food insecurity - inability: None  . Transportation needs - medical: None  . Transportation needs - non-medical: None  Occupational History  . None  Tobacco Use  . Smoking status: Current Every Day Smoker    Packs/day: 1.00    Years: 50.00    Pack years: 50.00  . Smokeless tobacco: Never Used  Substance and Sexual Activity  . Alcohol use: No   Alcohol/week: 0.0 oz  . Drug use: No  . Sexual activity: None  Other Topics Concern  . None  Social History Narrative  . None    Outpatient Encounter Medications as of 03/13/2017  Medication Sig  . glipiZIDE (GLUCOTROL XL) 2.5 MG 24 hr tablet take 1 tablet by mouth once daily before BREAKFAST  . glucose blood test strip Use as instructed to blood sugar twice daily  . levothyroxine (SYNTHROID, LEVOTHROID) 100 MCG tablet take 1 tablet by mouth once daily  . lisinopril (PRINIVIL,ZESTRIL) 10 MG tablet Take 1 tablet (10 mg total) by mouth daily.  . metFORMIN (GLUCOPHAGE-XR) 500 MG 24 hr tablet take 1 tablet by mouth twice a day with meal  . pantoprazole (PROTONIX) 40 MG tablet take 1 tablet by mouth twice a day before meals  . PNEUMOVAX 23 25 MCG/0.5ML injection inject 0.5 milliliter intramuscularly   No facility-administered encounter medications on file as of 03/13/2017.     Review of Systems  Constitutional: Negative for appetite change and unexpected weight change.  HENT: Negative for congestion and sinus pressure.   Respiratory: Negative for cough, chest tightness and shortness of breath.   Cardiovascular: Negative for  chest pain, palpitations and leg swelling.  Gastrointestinal: Negative for abdominal pain, diarrhea and nausea.  Genitourinary: Negative for difficulty urinating and dysuria.  Musculoskeletal: Negative for joint swelling and myalgias.  Skin: Negative for color change and rash.  Neurological: Negative for dizziness, light-headedness and headaches.  Psychiatric/Behavioral: Negative for agitation and dysphoric mood.       Objective:     Blood pressure rechecked by me:  142/78  Physical Exam  Constitutional: She appears well-developed and well-nourished. No distress.  HENT:  Nose: Nose normal.  Mouth/Throat: Oropharynx is clear and moist.  Neck: Neck supple. No thyromegaly present.  Cardiovascular: Normal rate and regular rhythm.  Pulmonary/Chest: Breath sounds  normal. No respiratory distress. She has no wheezes.  Abdominal: Soft. Bowel sounds are normal. There is no tenderness.  Musculoskeletal: She exhibits no edema or tenderness.  Feet:  No lesions.  Intact to light touch and pin prick.  DP pulses palpable and equal bilaterally.    Lymphadenopathy:    She has no cervical adenopathy.  Skin: No rash noted. No erythema.  Psychiatric: She has a normal mood and affect. Her behavior is normal.    BP 134/68 (BP Location: Left Arm, Patient Position: Sitting, Cuff Size: Normal)   Pulse 61   Temp 98.8 F (37.1 C) (Oral)   Resp 18   Wt 148 lb 12.8 oz (67.5 kg)   LMP 03/10/1991   SpO2 96%   BMI 27.22 kg/m  Wt Readings from Last 3 Encounters:  03/13/17 148 lb 12.8 oz (67.5 kg)  12/09/16 151 lb 9.6 oz (68.8 kg)  08/16/16 154 lb 6.4 oz (70 kg)     Lab Results  Component Value Date   WBC 9.7 12/06/2016   HGB 13.7 12/06/2016   HCT 41 12/06/2016   PLT 189 12/06/2016   GLUCOSE 146 (H) 03/13/2017   CHOL 215 (H) 03/13/2017   TRIG 144.0 03/13/2017   HDL 49.80 03/13/2017   LDLDIRECT 156.0 08/29/2016   LDLCALC 136 (H) 03/13/2017   ALT 12 03/13/2017   AST 12 03/13/2017   NA 138 03/13/2017   K 4.7 03/13/2017   CL 105 03/13/2017   CREATININE 1.03 03/13/2017   BUN 21 03/13/2017   CO2 25 03/13/2017   TSH 1.26 12/06/2016   HGBA1C 7.7 (H) 03/13/2017   MICROALBUR <0.7 08/29/2016    US Renal  Result Date: 06/27/2016 CLINICAL DATA:  Left upper pole renal lesion identified at chest CT 06/26/2015. EXAM: RENAL / URINARY TRACT ULTRASOUND COMPLETE COMPARISON:  CT of the chest 06/26/2015. CTA of the chest 06/27/2016 FINDINGS: Right Kidney: Length: 10.2 cm, within normal limits. Echogenicity within normal limits. Mild right-sided hydronephrosis is present. A 5 mm echogenic structure in the upper pole may represent a nonobstructing stone. Left Kidney: Length: 9.1 cm, within normal limits. Echogenicity within normal limits. Multiple simple cysts are present.  The lesion at the upper pole of the left kidney measures 2.1 x 2.0 x 2.2 cm, a simple cyst. A lower pole cyst measures 1.6 x 1.3 x 1.7 cm. Two subcentimeter cysts are present as well. Bladder: Appears normal for degree of bladder distention. IMPRESSION: 1. Exophytic lesions of the left kidney are compatible with simple cysts. 2. Mild right-sided hydronephrosis. 3. Question nonobstructing stone at the upper pole of the right kidney. Electronically Signed   By: San Morelle M.D.   On: 06/27/2016 17:06   Ct Chest Lung Cancer Screening Low Dose Wo Contrast  Result Date: 06/28/2016 CLINICAL DATA:  72 year old female current  smoker with 51 pack-year history of smoking. Lung cancer screening examination. EXAM: CT CHEST WITHOUT CONTRAST LOW-DOSE FOR LUNG CANCER SCREENING TECHNIQUE: Multidetector CT imaging of the chest was performed following the standard protocol without IV contrast. COMPARISON:  Low-dose lung cancer screening chest CT 06/26/2015. FINDINGS: Cardiovascular: Heart size is normal. There is no significant pericardial fluid, thickening or pericardial calcification. There is aortic atherosclerosis, as well as atherosclerosis of the great vessels of the mediastinum and the coronary arteries, including calcified atherosclerotic plaque in the left anterior descending and right coronary arteries. Mediastinum/Nodes: No pathologically enlarged mediastinal or hilar lymph nodes. Please note that accurate exclusion of hilar adenopathy is limited on noncontrast CT scans. Esophagus is unremarkable in appearance. No axillary lymphadenopathy. Lungs/Pleura: Several small pulmonary nodules are noted in the lungs, the largest of which is in the left lower lobe (axial image 156 of series 3) with a volume derived mean diameter of only 3.3 mm. No larger more suspicious appearing pulmonary nodules or masses are noted. No acute consolidative airspace disease. No pleural effusions. Upper Abdomen: Left kidney is atrophic  with multiple exophytic lesions which are low-attenuation, and incompletely characterized on today's noncontrast CT examination, but favored to represent cysts, measuring up to 2.4 cm in the upper pole. Status post cholecystectomy. Aortic atherosclerosis. Musculoskeletal: There are no aggressive appearing lytic or blastic lesions noted in the visualized portions of the skeleton. IMPRESSION: 1. Lung-RADS 2S, benign appearance or behavior. Continue annual screening with low-dose chest CT without contrast in 12 months. 2. The "S" modifier above refers to potentially clinically significant non lung cancer related findings. Specifically, there is aortic atherosclerosis, in addition to 2 vessel coronary artery disease. Please note that although the presence of coronary artery calcium documents the presence of coronary artery disease, the severity of this disease and any potential stenosis cannot be assessed on this non-gated CT examination. Assessment for potential risk factor modification, dietary therapy or pharmacologic therapy may be warranted, if clinically indicated. Electronically Signed   By: Vinnie Langton M.D.   On: 06/28/2016 09:12       Assessment & Plan:   Problem List Items Addressed This Visit    Aortic atherosclerosis ([place])    Declines cholesterol medication.  Follow.       Diabetes mellitus ([place])    Discussed diet and exercise.  Discussed importance of taking her medication regularly and not skipping.  Follow met b and a1c.        Relevant Orders   Hemoglobin A1c (Completed)   Basic metabolic panel (Completed)   GERD (gastroesophageal reflux disease)    Controlled on protonix.        Hypercholesterolemia    Low cholesterol diet and exercise.  Have discussed starting cholesterol medication.  She declines.  Follow lipid panel.       Relevant Orders   Hepatic function panel (Completed)   Lipid panel (Completed)   Hypertension - Primary    Blood pressure as outlined.  No taking  her lisinopril.  Discussed with her again today the importance of taking her medication regularly.  Discussed kidney benefits, etc.  Follow pressures.  Follow metabolic panel.        Hypothyroidism    On thyroid replacement.  Follow tsh.       Personal history of tobacco use, presenting hazards to health    Discussed the need to quit.         Other Visit Diagnoses    Breast cancer screening  Relevant Orders   MM DIGITAL SCREENING BILATERAL       Einar Pheasant, MD

## 2017-03-16 ENCOUNTER — Encounter: Payer: Self-pay | Admitting: Internal Medicine

## 2017-03-16 NOTE — Assessment & Plan Note (Signed)
Blood pressure as outlined.  No taking her lisinopril.  Discussed with her again today the importance of taking her medication regularly.  Discussed kidney benefits, etc.  Follow pressures.  Follow metabolic panel.

## 2017-03-16 NOTE — Assessment & Plan Note (Signed)
Declines cholesterol medication.  Follow.  

## 2017-03-16 NOTE — Assessment & Plan Note (Signed)
Low cholesterol diet and exercise.  Have discussed starting cholesterol medication.  She declines.  Follow lipid panel.

## 2017-03-16 NOTE — Assessment & Plan Note (Signed)
On thyroid replacement.  Follow tsh.  

## 2017-03-16 NOTE — Assessment & Plan Note (Signed)
Discussed diet and exercise.  Discussed importance of taking her medication regularly and not skipping.  Follow met b and a1c.

## 2017-03-16 NOTE — Assessment & Plan Note (Signed)
Discussed the need to quit.

## 2017-03-16 NOTE — Assessment & Plan Note (Signed)
Controlled on protonix.   

## 2017-03-31 ENCOUNTER — Ambulatory Visit
Admission: RE | Admit: 2017-03-31 | Discharge: 2017-03-31 | Disposition: A | Discharge status: Home / Self Care | Payer: Medicare Other | Source: Ambulatory Visit | Attending: Internal Medicine | Admitting: Internal Medicine

## 2017-03-31 DIAGNOSIS — Z1239 Encounter for other screening for malignant neoplasm of breast: Secondary | ICD-10-CM

## 2017-03-31 DIAGNOSIS — Z1231 Encounter for screening mammogram for malignant neoplasm of breast: Secondary | ICD-10-CM | POA: Insufficient documentation

## 2017-04-01 ENCOUNTER — Other Ambulatory Visit: Payer: Self-pay | Admitting: Internal Medicine

## 2017-05-12 ENCOUNTER — Ambulatory Visit (INDEPENDENT_AMBULATORY_CARE_PROVIDER_SITE_OTHER): Payer: Medicare Other

## 2017-05-12 VITALS — BP 130/68 | HR 62 | Temp 98.2°F | Resp 14 | Ht 62.0 in | Wt 146.1 lb

## 2017-05-12 DIAGNOSIS — E2839 Other primary ovarian failure: Secondary | ICD-10-CM | POA: Diagnosis not present

## 2017-05-12 DIAGNOSIS — Z0000 Encounter for general adult medical examination without abnormal findings: Secondary | ICD-10-CM | POA: Diagnosis not present

## 2017-05-12 DIAGNOSIS — Z1159 Encounter for screening for other viral diseases: Secondary | ICD-10-CM

## 2017-05-12 DIAGNOSIS — Z1211 Encounter for screening for malignant neoplasm of colon: Secondary | ICD-10-CM | POA: Diagnosis not present

## 2017-05-12 NOTE — Progress Notes (Addendum)
Subjective:   Ann Collins is a 72 y.o. female who presents for Medicare Annual (Subsequent) preventive examination.  Review of Systems:  No ROS.  Medicare Wellness Visit. Additional risk factors are reflected in the social history.  Cardiac Risk Factors include: advanced age (>57men, >49 women);hypertension;diabetes mellitus     Objective:     Vitals: BP 130/68 (BP Location: Left Arm, Patient Position: Sitting, Cuff Size: Normal)   Pulse 62   Temp 98.2 F (36.8 C) (Oral)   Resp 14   Ht 5\' 2"  (1.575 m)   Wt 146 lb 1.9 oz (66.3 kg)   LMP 03/10/1991   SpO2 94%   BMI 26.73 kg/m   Body mass index is 26.73 kg/m.  Advanced Directives 05/12/2017  Does Patient Have a Medical Advance Directive? Yes  Type of Paramedic of [place];Living will  Does patient want to make changes to medical advance directive? No - Patient declined  Copy of [place] in Chart? No - copy requested    Tobacco Social History   Tobacco Use  Smoking Status Current Every Day Smoker  . Packs/day: 1.00  . Years: 50.00  . Pack years: 50.00  Smokeless Tobacco Never Used     Ready to quit: Not Answered Counseling given: Not Answered   Clinical Intake:  Pre-visit preparation completed: Yes  Pain : No/denies pain     Nutritional Status: BMI 25 -29 Overweight Diabetes: Yes(Followed by PCP)  How often do you need to have someone help you when you read instructions, pamphlets, or other written materials from your doctor or pharmacy?: 1 - Never  Interpreter Needed?: No     Past Medical History:  Diagnosis Date  . Diabetes mellitus ([place])   . Diverticulosis   . GERD (gastroesophageal reflux disease)   . Hematuria   . Hypercholesterolemia   . Hypertension   . Hypothyroidism   . Kidney stones    Past Surgical History:  Procedure Laterality Date  . APPENDECTOMY    . BREAST BIOPSY  1969   right  . CHOLECYSTECTOMY  2005   Family History    Problem Relation Age of Onset  . Brain cancer Mother        history of brain tumor  . Lung disease Father   . Diabetes Brother        x2  . Bipolar disorder Son   . Alcohol abuse Son   . Breast cancer Neg Hx   . Colon cancer Neg Hx    Social History   Socioeconomic History  . Marital status: Married    Spouse name: Not on file  . Number of children: Not on file  . Years of education: Not on file  . Highest education level: Not on file  Occupational History  . Not on file  Social Needs  . Financial resource strain: Not hard at all  . Food insecurity:    Worry: Never true    Inability: Never true  . Transportation needs:    Medical: No    Non-medical: No  Tobacco Use  . Smoking status: Current Every Day Smoker    Packs/day: 1.00    Years: 50.00    Pack years: 50.00  . Smokeless tobacco: Never Used  Substance and Sexual Activity  . Alcohol use: No    Alcohol/week: 0.0 oz  . Drug use: No  . Sexual activity: Not on file  Lifestyle  . Physical activity:    Days per  week: Not on file    Minutes per session: Not on file  . Stress: Not on file  Relationships  . Social connections:    Talks on phone: Not on file    Gets together: Not on file    Attends religious service: Not on file    Active member of club or organization: Not on file    Attends meetings of clubs or organizations: Not on file    Relationship status: Not on file  Other Topics Concern  . Not on file  Social History Narrative  . Not on file    Outpatient Encounter Medications as of 05/12/2017  Medication Sig  . glipiZIDE (GLUCOTROL XL) 2.5 MG 24 hr tablet take 1 tablet by mouth once daily before BREAKFAST  . glucose blood test strip Use as instructed to blood sugar twice daily  . levothyroxine (SYNTHROID, LEVOTHROID) 100 MCG tablet take 1 tablet by mouth once daily  . lisinopril (PRINIVIL,ZESTRIL) 10 MG tablet Take 1 tablet (10 mg total) by mouth daily.  . metFORMIN (GLUCOPHAGE-XR) 500 MG 24 hr  tablet take 1 tablet by mouth twice a day with meal  . pantoprazole (PROTONIX) 40 MG tablet take 1 tablet by mouth twice a day before meals  . PNEUMOVAX 23 25 MCG/0.5ML injection inject 0.5 milliliter intramuscularly   No facility-administered encounter medications on file as of 05/12/2017.     Activities of Daily Living In your present state of health, do you have any difficulty performing the following activities: 05/12/2017  Hearing? N  Vision? N  Difficulty concentrating or making decisions? N  Walking or climbing stairs? N  Dressing or bathing? N  Doing errands, shopping? N  Preparing Food and eating ? N  Using the Toilet? N  In the past six months, have you accidently leaked urine? N  Do you have problems with loss of bowel control? N  Managing your Medications? N  Managing your Finances? N  Housekeeping or managing your Housekeeping? N  Some recent data might be hidden    Patient Care Team: Einar Pheasant, MD as PCP - General (Internal Medicine)    Assessment:   This is a routine wellness examination for Ann Collins.  The goal of the wellness visit is to assist the patient how to close the gaps in care and create a preventative care plan for the patient.   The roster of all physicians providing medical care to patient is listed in the Snapshot section of the chart.  Osteoporosis risk reviewed.  Dexa Scan ordered.  Educational material provided.  Safety issues reviewed; Smoke and carbon monoxide detectors in the home. No firearms in the home. Wears seatbelts when driving or riding with others. No violence in the home.  They do not have excessive sun exposure.  Discussed the need for sun protection: hats, long sleeves and the use of sunscreen if there is significant sun exposure.  Patient is alert, normal appearance, oriented to person/place/and time.  Correctly identified the president of the [place] and recalls of 2/3 words. Performs simple calculations and can read correct time  from watch face. Displays appropriate judgement.  No new identified risk were noted.  No failures at ADL's or IADL's.    BMI- discussed the importance of a healthy diet, water intake and the benefits of aerobic exercise. She plans to eat a low car/low cholesterol diet, increase water intake and continue walking for exercise.  24 hour diet recall: Regular diet  Dental- Upper partial dentures. Visits every 6  months. Dr. Leanor Kail.   Eye- Visual acuity not assessed per patient preference since they have regular follow up with the ophthalmologist.  Wears corrective lenses.  Sleep patterns- Sleeps 6 hours at night.  Nocturia x2.  Wakes feeling rested. Discussed limiting caffeine throughout the day and other beverages before bedtime.  Hepatitis C screening complete.  Cologuard ordered; follow as directed.  Educational material provided.  Diabetes- followed by PCP.  Schedule follow up for June.  Foot exam due.   Patient Concerns: None at this time. Follow up with PCP as needed.  Exercise Activities and Dietary recommendations Current Exercise Habits: Home exercise routine, Type of exercise: walking, Time (Minutes): 30, Frequency (Times/Week): 3, Weekly Exercise (Minutes/Week): 90, Intensity: Moderate  Goals    . DIET - INCREASE WATER INTAKE     Stay hydrated. Substitute 1 soda for 1 bottle of water.    . INCREASE LEAN PROTEINS     Low carb diet; diabetic diet Low cholesterol diet       Fall Risk Fall Risk  05/12/2017 12/09/2016 11/30/2015 09/21/2015 07/22/2014  Falls in the past year? No No No No No  Number falls in past yr: - - - - -  Injury with Fall? - - - - -  Risk for fall due to : - - - - -   Depression Screen PHQ 2/9 Scores 05/12/2017 12/09/2016 06/10/2016 11/30/2015  PHQ - 2 Score 0 0 0 0  PHQ- 9 Score - - 0 -     Cognitive Function     6CIT Screen 05/12/2017  What Year? 0 points  What month? 0 points  What time? 0 points  Count back from 20 0 points  Months in  reverse 0 points  Repeat phrase 0 points  Total Score 0    Immunization History  Administered Date(s) Administered  . Influenza Split 10/16/2013  . Influenza Whole 11/24/2016  . Influenza-Unspecified 11/07/2012, 10/27/2014  . Pneumococcal Conjugate-13 03/02/2016  . Pneumococcal Polysaccharide-23 12/08/2012  . Tdap 10/14/2013   Screening Tests Health Maintenance  Topic Date Due  . Hepatitis C Screening  [birth year]  . COLONOSCOPY  11/17/1995  . DEXA SCAN  11/17/2010  . FOOT EXAM  07/05/2013  . INFLUENZA VACCINE  09/07/2017  . HEMOGLOBIN A1C  09/10/2017  . OPHTHALMOLOGY EXAM  12/13/2017  . MAMMOGRAM  03/31/2018  . TETANUS/TDAP  10/15/2023  . PNA vac Low Risk Adult  Completed      Plan:    End of life planning; Advance aging; Advanced directives discussed. Copy of current HCPOA/Living Will requested.    I have personally reviewed and noted the following in the patient's chart:   . Medical and social history . Use of alcohol, tobacco or illicit drugs  . Current medications and supplements . Functional ability and status . Nutritional status . Physical activity . Advanced directives . List of other physicians . Hospitalizations, surgeries, and ER visits in previous 12 months . Vitals . Screenings to include cognitive, depression, and falls . Referrals and appointments  In addition, I have reviewed and discussed with patient certain preventive protocols, quality metrics, and best practice recommendations. A written personalized care plan for preventive services as well as general preventive health recommendations were provided to patient.     OBrien-Blaney, Said Rueb L, LPN  03/11/9796   I have reviewed the above information and agree with above.   Deborra Medina, MD

## 2017-05-12 NOTE — Patient Instructions (Addendum)
Ann Collins , Thank you for taking time to come for your Medicare Wellness Visit. I appreciate your ongoing commitment to your health goals. Please review the following plan we discussed and let me know if I can assist you in the future.   Dexa Scan ordered; follow as directed.  COLOguard ordered; follow as directed.  Lab for screening.  Schedule diabetic follow up for June.  Bring a copy of your [place] and/or Living Will to be scanned into chart.  Have a great day!  These are the goals we discussed: Goals    . DIET - INCREASE WATER INTAKE     Stay hydrated. Substitute 1 soda for 1 bottle of water.    . INCREASE LEAN PROTEINS     Low carb diet; diabetic diet Low cholesterol diet       This is a list of the screening recommended for you and due dates:  Health Maintenance  Topic Date Due  . Cologuard (Stool DNA test)  11/17/1995  . DEXA scan (bone density measurement)  11/17/2010  . Complete foot exam   07/05/2013  . Flu Shot  09/07/2017  . Hemoglobin A1C  09/10/2017  . Eye exam for diabetics  12/13/2017  . Mammogram  03/31/2018  . Tetanus Vaccine  10/15/2023  .  Hepatitis C: One time screening is recommended by Center for Disease Control  (CDC) for  adults born from [birth year] through 1965.   Completed  . Pneumonia vaccines  Completed   Bone Densitometry Bone densitometry is an imaging test that uses a special X-ray to measure the amount of calcium and other minerals in your bones (bone density). This test is also known as a bone mineral density test or dual-energy X-ray absorptiometry (DXA). The test can measure bone density at your hip and your spine. It is similar to having a regular X-ray. You may have this test to:  Diagnose a condition that causes weak or thin bones (osteoporosis).  Predict your risk of a broken bone (fracture).  Determine how well osteoporosis treatment is working.  Tell a health care provider about:  Any allergies you  have.  All medicines you are taking, including vitamins, herbs, eye drops, creams, and over-the-counter medicines.  Any problems you or family members have had with anesthetic medicines.  Any blood disorders you have.  Any surgeries you have had.  Any medical conditions you have.  Possibility of pregnancy.  Any other medical test you had within the previous 14 days that used contrast material. What are the risks? Generally, this is a safe procedure. However, problems can occur and may include the following:  This test exposes you to a very small amount of radiation.  The risks of radiation exposure may be greater to unborn children.  What happens before the procedure?  Do not take any calcium supplements for 24 hours before having the test. You can otherwise eat and drink what you usually do.  Take off all metal jewelry, eyeglasses, dental appliances, and any other metal objects. What happens during the procedure?  You may lie on an exam table. There will be an X-ray generator below you and an imaging device above you.  Other devices, such as boxes or braces, may be used to position your body properly for the scan.  You will need to lie still while the machine slowly scans your body.  The images will show up on a computer monitor. What happens after the procedure? You may need  more testing at a later time. This information is not intended to replace advice given to you by your health care provider. Make sure you discuss any questions you have with your health care provider. Document Released: 02/16/2004 Document Revised: 07/02/2015 Document Reviewed: 07/04/2013 Elsevier Interactive Patient Education  2018 [employer].   Colonoscopy, Adult A colonoscopy is an exam to look at the entire large intestine. During the exam, a lubricated, bendable tube is inserted into the anus and then passed into the rectum, colon, and other parts of the large intestine. A colonoscopy is  often done as a part of normal colorectal screening or in response to certain symptoms, such as anemia, persistent diarrhea, abdominal pain, and blood in the stool. The exam can help screen for and diagnose medical problems, including:  Tumors.  Polyps.  Inflammation.  Areas of bleeding.  Tell a health care provider about:  Any allergies you have.  All medicines you are taking, including vitamins, herbs, eye drops, creams, and over-the-counter medicines.  Any problems you or family members have had with anesthetic medicines.  Any blood disorders you have.  Any surgeries you have had.  Any medical conditions you have.  Any problems you have had passing stool. What are the risks? Generally, this is a safe procedure. However, problems may occur, including:  Bleeding.  A tear in the intestine.  A reaction to medicines given during the exam.  Infection (rare).  What happens before the procedure? Eating and drinking restrictions Follow instructions from your health care provider about eating and drinking, which may include:  A few days before the procedure - follow a low-fiber diet. Avoid nuts, seeds, dried fruit, raw fruits, and vegetables.  1-3 days before the procedure - follow a clear liquid diet. Drink only clear liquids, such as clear broth or bouillon, black coffee or tea, clear juice, clear soft drinks or sports drinks, gelatin dessert, and popsicles. Avoid any liquids that contain red or purple dye.  On the day of the procedure - do not eat or drink anything during the 2 hours before the procedure, or within the time period that your health care provider recommends.  Bowel prep If you were prescribed an oral bowel prep to clean out your colon:  Take it as told by your health care provider. Starting the day before your procedure, you will need to drink a large amount of medicated liquid. The liquid will cause you to have multiple loose stools until your stool is  almost clear or light green.  If your skin or anus gets irritated from diarrhea, you may use these to relieve the irritation: ? Medicated wipes, such as adult wet wipes with aloe and vitamin E. ? A skin soothing-product like petroleum jelly.  If you vomit while drinking the bowel prep, take a break for up to 60 minutes and then begin the bowel prep again. If vomiting continues and you cannot take the bowel prep without vomiting, call your health care provider.  General instructions  Ask your health care provider about changing or stopping your regular medicines. This is especially important if you are taking diabetes medicines or blood thinners.  Plan to have someone take you home from the hospital or clinic. What happens during the procedure?  An IV tube may be inserted into one of your veins.  You will be given medicine to help you relax (sedative).  To reduce your risk of infection: ? Your health care team will wash or sanitize their hands. ?  Your anal area will be washed with soap.  You will be asked to lie on your side with your knees bent.  Your health care provider will lubricate a long, thin, flexible tube. The tube will have a camera and a light on the end.  The tube will be inserted into your anus.  The tube will be gently eased through your rectum and colon.  Air will be delivered into your colon to keep it open. You may feel some pressure or cramping.  The camera will be used to take images during the procedure.  A small tissue sample may be removed from your body to be examined under a microscope (biopsy). If any potential problems are found, the tissue will be sent to a lab for testing.  If small polyps are found, your health care provider may remove them and have them checked for cancer cells.  The tube that was inserted into your anus will be slowly removed. The procedure may vary among health care providers and hospitals. What happens after the  procedure?  Your blood pressure, heart rate, breathing rate, and blood oxygen level will be monitored until the medicines you were given have worn off.  Do not drive for 24 hours after the exam.  You may have a small amount of blood in your stool.  You may pass gas and have mild abdominal cramping or bloating due to the air that was used to inflate your colon during the exam.  It is up to you to get the results of your procedure. Ask your health care provider, or the department performing the procedure, when your results will be ready. This information is not intended to replace advice given to you by your health care provider. Make sure you discuss any questions you have with your health care provider. Document Released: 01/22/2000 Document Revised: 11/25/2015 Document Reviewed: 04/07/2015 Elsevier Interactive Patient Education  2018 [employer].

## 2017-05-14 LAB — HEPATITIS C ANTIBODY
Hepatitis C Ab: NONREACTIVE
SIGNAL TO CUT-OFF: 0.04 (ref <1.00)

## 2017-05-15 ENCOUNTER — Encounter: Payer: Self-pay | Admitting: Internal Medicine

## 2017-06-14 DIAGNOSIS — Z1212 Encounter for screening for malignant neoplasm of rectum: Secondary | ICD-10-CM | POA: Diagnosis not present

## 2017-06-14 DIAGNOSIS — Z1211 Encounter for screening for malignant neoplasm of colon: Secondary | ICD-10-CM | POA: Diagnosis not present

## 2017-06-15 LAB — COLOGUARD: COLOGUARD: POSITIVE

## 2017-06-26 ENCOUNTER — Telehealth: Payer: Self-pay | Admitting: *Deleted

## 2017-06-26 DIAGNOSIS — Z122 Encounter for screening for malignant neoplasm of respiratory organs: Secondary | ICD-10-CM

## 2017-06-26 DIAGNOSIS — Z87891 Personal history of nicotine dependence: Secondary | ICD-10-CM

## 2017-06-26 NOTE — Telephone Encounter (Signed)
Notified patient that annual lung cancer screening low dose CT scan is due currently or will be in near future. Confirmed that patient is within the age range of 55-77, and asymptomatic, (no signs or symptoms of lung cancer). Patient denies illness that would prevent curative treatment for lung cancer if found. Verified smoking history, (current, 52 pack year). The shared decision making visit was done 06/26/15. Patient is agreeable for CT scan being scheduled.

## 2017-06-30 ENCOUNTER — Ambulatory Visit
Admission: RE | Admit: 2017-06-30 | Discharge: 2017-06-30 | Disposition: A | Discharge status: Home / Self Care | Payer: Medicare Other | Source: Ambulatory Visit | Attending: Oncology | Admitting: Oncology

## 2017-06-30 DIAGNOSIS — Z122 Encounter for screening for malignant neoplasm of respiratory organs: Secondary | ICD-10-CM | POA: Diagnosis not present

## 2017-06-30 DIAGNOSIS — I700 Atherosclerosis of aorta: Secondary | ICD-10-CM | POA: Diagnosis not present

## 2017-06-30 DIAGNOSIS — Z87891 Personal history of nicotine dependence: Secondary | ICD-10-CM | POA: Diagnosis not present

## 2017-06-30 DIAGNOSIS — F17210 Nicotine dependence, cigarettes, uncomplicated: Secondary | ICD-10-CM | POA: Diagnosis not present

## 2017-07-02 ENCOUNTER — Other Ambulatory Visit: Payer: Self-pay | Admitting: Internal Medicine

## 2017-07-05 ENCOUNTER — Encounter: Payer: Self-pay | Admitting: *Deleted

## 2017-07-10 ENCOUNTER — Other Ambulatory Visit: Payer: Self-pay

## 2017-07-10 ENCOUNTER — Encounter: Payer: Self-pay | Admitting: Internal Medicine

## 2017-07-10 ENCOUNTER — Ambulatory Visit (INDEPENDENT_AMBULATORY_CARE_PROVIDER_SITE_OTHER): Payer: Medicare Other | Admitting: Internal Medicine

## 2017-07-10 ENCOUNTER — Emergency Department
Admission: EM | Admit: 2017-07-10 | Discharge: 2017-07-10 | Disposition: A | Discharge status: Home / Self Care | Payer: Medicare Other | Attending: Emergency Medicine | Admitting: Emergency Medicine

## 2017-07-10 ENCOUNTER — Emergency Department: Payer: Medicare Other

## 2017-07-10 ENCOUNTER — Encounter: Payer: Self-pay | Admitting: Emergency Medicine

## 2017-07-10 VITALS — BP 138/78 | HR 65 | Temp 98.1°F | Resp 18 | Wt 149.8 lb

## 2017-07-10 DIAGNOSIS — Z79899 Other long term (current) drug therapy: Secondary | ICD-10-CM | POA: Insufficient documentation

## 2017-07-10 DIAGNOSIS — I700 Atherosclerosis of aorta: Secondary | ICD-10-CM | POA: Diagnosis not present

## 2017-07-10 DIAGNOSIS — R001 Bradycardia, unspecified: Secondary | ICD-10-CM | POA: Diagnosis not present

## 2017-07-10 DIAGNOSIS — K2100 Gastro-esophageal reflux disease with esophagitis, without bleeding: Secondary | ICD-10-CM

## 2017-07-10 DIAGNOSIS — F17210 Nicotine dependence, cigarettes, uncomplicated: Secondary | ICD-10-CM | POA: Diagnosis not present

## 2017-07-10 DIAGNOSIS — R079 Chest pain, unspecified: Secondary | ICD-10-CM

## 2017-07-10 DIAGNOSIS — I10 Essential (primary) hypertension: Secondary | ICD-10-CM

## 2017-07-10 DIAGNOSIS — R1310 Dysphagia, unspecified: Secondary | ICD-10-CM | POA: Insufficient documentation

## 2017-07-10 DIAGNOSIS — E119 Type 2 diabetes mellitus without complications: Secondary | ICD-10-CM

## 2017-07-10 DIAGNOSIS — E039 Hypothyroidism, unspecified: Secondary | ICD-10-CM | POA: Diagnosis not present

## 2017-07-10 DIAGNOSIS — R1013 Epigastric pain: Secondary | ICD-10-CM | POA: Insufficient documentation

## 2017-07-10 DIAGNOSIS — Z7984 Long term (current) use of oral hypoglycemic drugs: Secondary | ICD-10-CM | POA: Diagnosis not present

## 2017-07-10 DIAGNOSIS — E7800 Pure hypercholesterolemia, unspecified: Secondary | ICD-10-CM | POA: Diagnosis not present

## 2017-07-10 LAB — BASIC METABOLIC PANEL
ANION GAP: 9 (ref 5–15)
BUN: 16 mg/dL (ref 6–20)
CHLORIDE: 106 mmol/L (ref 101–111)
CO2: 25 mmol/L (ref 22–32)
Calcium: 9.6 mg/dL (ref 8.9–10.3)
Creatinine, Ser: 0.96 mg/dL (ref 0.44–1.00)
GFR calc Af Amer: >60 mL/min (ref >60)
GFR, EST NON AFRICAN AMERICAN: 58 mL/min — AB (ref >60)
GLUCOSE: 168 mg/dL — AB (ref 65–99)
POTASSIUM: 4.8 mmol/L (ref 3.5–5.1)
Sodium: 140 mmol/L (ref 135–145)

## 2017-07-10 LAB — HEPATIC FUNCTION PANEL
ALT: 14 U/L (ref 14–54)
AST: 20 U/L (ref 15–41)
Albumin: 3.8 g/dL (ref 3.5–5.0)
Alkaline Phosphatase: 94 U/L (ref 38–126)
BILIRUBIN TOTAL: 0.6 mg/dL (ref 0.3–1.2)
Total Protein: 7.3 g/dL (ref 6.5–8.1)

## 2017-07-10 LAB — CBC
HEMATOCRIT: 43.2 % (ref 35.0–47.0)
HEMOGLOBIN: 14.3 g/dL (ref 12.0–16.0)
MCH: 29.1 pg (ref 26.0–34.0)
MCHC: 33 g/dL (ref 32.0–36.0)
MCV: 88.1 fL (ref 80.0–100.0)
Platelets: 172 10*3/uL (ref 150–440)
RBC: 4.9 MIL/uL (ref 3.80–5.20)
RDW: 14.9 % — ABNORMAL HIGH (ref 11.5–14.5)
WBC: 7.8 10*3/uL (ref 3.6–11.0)

## 2017-07-10 LAB — LIPASE, BLOOD: Lipase: 30 U/L (ref 11–51)

## 2017-07-10 LAB — TROPONIN I: Troponin I: <0.03 ng/mL (ref <0.03)

## 2017-07-10 MED ORDER — OMEPRAZOLE 40 MG PO CPDR: 40.0000 mg | DELAYED_RELEASE_CAPSULE | Freq: Two times a day (BID) | ORAL | 1 refills | Status: DC

## 2017-07-10 NOTE — Progress Notes (Signed)
Patient ID: Ann Collins, female   DOB: [birth year], 72 y.o.   MRN: [id number]   Subjective:    Patient ID: Ann Collins, female    DOB: 09-02-1945, 72 y.o.   MRN: [id number]  HPI  Patient here for a scheduled follow up.  She reports she has not felt well lately.  She reports that over the last several weeks she has had what she describes as epigastric pain that will radiate up into her jaw and down her arms.  Increased gas and feels like her upper abdomen is swollen.  Some nausea.  No vomiting.  Will notice the epigastric pain with some pain radiating to left upper quadrant.  She is eating.  Has had some decreased appetite.  Some problems previously getting her bowels to move, but states they are moving slowly now.  Last bowel movement 2 days ago.  No blood.  No diarrhea now.  Also reports some dysphagia and some intermittent burning sensation in her chest.  Has not been taking her medication.  Back on glipizide now, but not taking any other medication.  Feels some better now.     Past Medical History:  Diagnosis Date  . Diabetes mellitus ([place])   . Diverticulosis   . GERD (gastroesophageal reflux disease)   . Hematuria   . Hypercholesterolemia   . Hypertension   . Hypothyroidism   . Kidney stones    Past Surgical History:  Procedure Laterality Date  . APPENDECTOMY    . BREAST BIOPSY  1969   right  . CHOLECYSTECTOMY  2005   Family History  Problem Relation Age of Onset  . Brain cancer Mother        history of brain tumor  . Lung disease Father   . Diabetes Brother        x2  . Bipolar disorder Son   . Alcohol abuse Son   . Breast cancer Neg Hx   . Colon cancer Neg Hx    Social History   Socioeconomic History  . Marital status: Married    Spouse name: Not on file  . Number of children: Not on file  . Years of education: Not on file  . Highest education level: Not on file  Occupational History  . Not on file  Social Needs  . Financial resource strain: Not hard at  all  . Food insecurity:    Worry: Never true    Inability: Never true  . Transportation needs:    Medical: No    Non-medical: No  Tobacco Use  . Smoking status: Current Every Day Smoker    Packs/day: 1.00    Years: 50.00    Pack years: 50.00  . Smokeless tobacco: Never Used  Substance and Sexual Activity  . Alcohol use: No    Alcohol/week: 0.0 oz  . Drug use: No  . Sexual activity: Not on file  Lifestyle  . Physical activity:    Days per week: Not on file    Minutes per session: Not on file  . Stress: Not on file  Relationships  . Social connections:    Talks on phone: Not on file    Gets together: Not on file    Attends religious service: Not on file    Active member of club or organization: Not on file    Attends meetings of clubs or organizations: Not on file    Relationship status: Not on file  Other Topics Concern  . Not on file  Social History Narrative  . Not on file    Outpatient Encounter Medications as of 07/10/2017  Medication Sig  . glipiZIDE (GLUCOTROL XL) 2.5 MG 24 hr tablet take 1 tablet by mouth once daily before BREAKFAST  . glucose blood test strip Use as instructed to blood sugar twice daily  . levothyroxine (SYNTHROID, LEVOTHROID) 100 MCG tablet take 1 tablet by mouth once daily  . lisinopril (PRINIVIL,ZESTRIL) 10 MG tablet Take 1 tablet (10 mg total) by mouth daily.  . metFORMIN (GLUCOPHAGE-XR) 500 MG 24 hr tablet take 1 tablet by mouth twice a day with meal  . pantoprazole (PROTONIX) 40 MG tablet take 1 tablet by mouth twice a day before meals  . [DISCONTINUED] PNEUMOVAX 23 25 MCG/0.5ML injection inject 0.5 milliliter intramuscularly   No facility-administered encounter medications on file as of 07/10/2017.     Review of Systems  Constitutional: Positive for appetite change. Negative for fever.  HENT: Negative for congestion and sinus pressure.   Respiratory: Negative for cough and shortness of breath.   Cardiovascular: Positive for chest pain.  Negative for palpitations and leg swelling.  Gastrointestinal: Positive for abdominal pain and nausea. Negative for vomiting.       No diarrhea now.   Genitourinary: Negative for difficulty urinating and dysuria.  Musculoskeletal: Negative for joint swelling and myalgias.  Skin: Negative for color change and rash.  Neurological: Negative for dizziness, light-headedness and headaches.  Psychiatric/Behavioral: Negative for agitation and dysphoric mood.       Objective:     Blood pressure rechecked by me:  162/78  Physical Exam  Constitutional: She appears well-developed and well-nourished. No distress.  HENT:  Nose: Nose normal.  Mouth/Throat: Oropharynx is clear and moist.  Neck: Neck supple. No thyromegaly present.  Cardiovascular: Normal rate and regular rhythm.  Pulmonary/Chest: Breath sounds normal. No respiratory distress. She has no wheezes.  Abdominal: Soft. Bowel sounds are normal. There is no tenderness.  Musculoskeletal: She exhibits no edema or tenderness.  Lymphadenopathy:    She has no cervical adenopathy.  Skin: No rash noted. No erythema.  Psychiatric: She has a normal mood and affect. Her behavior is normal.    BP 138/78 (BP Location: Left Arm, Patient Position: Sitting, Cuff Size: Normal)   Pulse 65   Temp 98.1 F (36.7 C) (Oral)   Resp 18   Wt 149 lb 12.8 oz (67.9 kg)   LMP 03/10/1991   SpO2 98%   BMI 27.40 kg/m  Wt Readings from Last 3 Encounters:  07/10/17 149 lb 12.8 oz (67.9 kg)  06/30/17 150 lb (68 kg)  05/12/17 146 lb 1.9 oz (66.3 kg)     Lab Results  Component Value Date   WBC 9.7 12/06/2016   HGB 13.7 12/06/2016   HCT 41 12/06/2016   PLT 189 12/06/2016   GLUCOSE 146 (H) 03/13/2017   CHOL 215 (H) 03/13/2017   TRIG 144.0 03/13/2017   HDL 49.80 03/13/2017   LDLDIRECT 156.0 08/29/2016   LDLCALC 136 (H) 03/13/2017   ALT 12 03/13/2017   AST 12 03/13/2017   NA 138 03/13/2017   K 4.7 03/13/2017   CL 105 03/13/2017   CREATININE 1.03  03/13/2017   BUN 21 03/13/2017   CO2 25 03/13/2017   TSH 1.26 12/06/2016   HGBA1C 7.7 (H) 03/13/2017   MICROALBUR <0.7 08/29/2016    Ct Chest Lung Cancer Screening Low Dose Wo Contrast  Result Date: 06/30/2017 CLINICAL DATA:  71-year-old female current smoker, with 52 pack-year history of   smoking, for follow-up lung cancer screening EXAM: CT CHEST WITHOUT CONTRAST LOW-DOSE FOR LUNG CANCER SCREENING TECHNIQUE: Multidetector CT imaging of the chest was performed following the standard protocol without IV contrast. COMPARISON:  Low-dose lung cancer screening CT chest dated 06/27/2016 FINDINGS: Cardiovascular: Heart is normal in size.  No pericardial effusion. No evidence of thoracic aortic aneurysm. Mild atherosclerotic calcifications of the aortic arch. Coronary atherosclerosis of the LAD. Mediastinum/Nodes: No suspicious mediastinal lymphadenopathy. Lungs/Pleura: No focal consolidation. Small bilateral pulmonary nodules measuring up to 3.6 mm. No pleural effusion or pneumothorax. Upper Abdomen: Visualized upper abdomen is notable for a 14 mm left adrenal adenoma, prior cholecystectomy, and left renal cysts measuring up to 2.5 cm. Musculoskeletal: Degenerative changes of the visualized thoracolumbar spine. IMPRESSION: Lung-RADS 2, benign appearance or behavior. Continue annual screening with low-dose chest CT without contrast in 12 months. Aortic Atherosclerosis (ICD10-I70.0). Electronically Signed   By: Sriyesh  Krishnan M.D.   On: 06/30/2017 13:33       Assessment & Plan:   Problem List Items Addressed This Visit    Aortic atherosclerosis (HCC)    Has declined cholesterol medication.  Have tried on multiple occasions to start.        Chest pain - Primary    Describes the chest pain with radiation up into her jaw and down into her arms as outlined.  EKG - SR with TWI I, aVL, and throughout the precordium.  New from last EKG.  Given symptoms, risk factors and EKG changes, feel further cardiac  w/up warranted.  She declined transportation in ambulance.  Husband called and will take straight to ER.  ER notified.        Relevant Orders   EKG 12-Lead (Completed)   Diabetes mellitus (HCC)    She does not take her medication regularly.  Has not been on metformin lately secondary to her current acute symptoms.  Just started back on glipizide.  Follow met b and a1c.        GERD (gastroesophageal reflux disease)    Not taking protonix now.  Will need to restart.  Not sure if her pain could be more GI in origin, but given symptoms with associated EKG changes, need to rule out heart issues first.  Pt in agreement.  May need CT scan.        Hypercholesterolemia    Has declined cholesterol medication.  Low cholesterol diet and exercise.  Follow lipid panel.       Hypertension    Blood pressure elevated.  Not taking her blood pressure medication.  Will need to restart.            SCOTT, CHARLENE, MD  

## 2017-07-10 NOTE — Assessment & Plan Note (Signed)
Has declined cholesterol medication.  Have tried on multiple occasions to start.

## 2017-07-10 NOTE — Assessment & Plan Note (Signed)
Blood pressure elevated.  Not taking her blood pressure medication.  Will need to restart.

## 2017-07-10 NOTE — Assessment & Plan Note (Signed)
Describes the chest pain with radiation up into her jaw and down into her arms as outlined.  EKG - SR with TWI I, aVL, and throughout the precordium.  New from last EKG.  Given symptoms, risk factors and EKG changes, feel further cardiac w/up warranted.  She declined transportation in ambulance.  Husband called and will take straight to ER.  ER notified.

## 2017-07-10 NOTE — ED Provider Notes (Addendum)
[facility] Emergency Department Provider Note  ____________________________________________   I have reviewed the triage vital signs and the nursing notes. Where available I have reviewed prior notes and, if possible and indicated, outside hospital notes.    HISTORY  Chief Complaint Abnormal ECG    HPI Ann Collins is a 72 y.o. female with a history of esophageal erosions in the past, history of dysphasia presents today with abdominal pain in the epigastric region which is worse with food and present therefore 5 or 6 months.  She states when she eats the pain shows up and then it gradually goes away.  She has no exertional chest pain she has no chest pain of any variety.  Sometimes the pain goes up her esophagus.  She is tried taking antiacids with no relief.  If she is not eating she does not have the pain.  Is worse several weeks ago, she states no particular kind of food elicits this pain.  She denies any fever chills she has [place] no exertional symptoms no leg swelling, she does not have any history of ACS, she has had a stress test but not recently.  She was seen by her primary care doctor and they did an EKG which looked abnormal to them and they sent her in here for further evaluation of possible ACS for this discomfort.  Patient states that she is sure that this is a stomach issue as she has had this multiple times in her life.  She has not lost any significant weight with it.  She is able to eat, she states she has no pain at this time and has not had any pain today, is only when she eats that she has the pain.  She states that she has had normal bowel movements with no melena no bright red blood per rectum no hematemesis.  She has not vomited.  She does have some mild food aversion with this. If she had not had an EKG performed, she would not be here today Some burning discomfort with food no radiation usually sometimes goes up her esophagus as she describes  it   Past Medical History:  Diagnosis Date  . Diabetes mellitus ([place])   . Diverticulosis   . GERD (gastroesophageal reflux disease)   . Hematuria   . Hypercholesterolemia   . Hypertension   . Hypothyroidism   . Kidney stones     Patient Active Problem List   Diagnosis Date Noted  . Chest pain 07/10/2017  . Renal lesion 04/10/2016  . Aortic atherosclerosis ([place]) 06/30/2015  . Personal history of tobacco use, presenting hazards to health 06/25/2015  . Left carotid bruit 12/28/2014  . Health care maintenance 07/29/2014  . Diabetes mellitus ([place]) 03/10/2012  . Hypothyroidism 03/10/2012  . Hypercholesterolemia 03/10/2012  . Hypertension 03/10/2012  . GERD (gastroesophageal reflux disease) 03/10/2012    Past Surgical History:  Procedure Laterality Date  . APPENDECTOMY    . BREAST BIOPSY  1969   right  . CHOLECYSTECTOMY  2005    Prior to Admission medications   Medication Sig Start Date End Date Taking? Authorizing Provider  glipiZIDE (GLUCOTROL XL) 2.5 MG 24 hr tablet take 1 tablet by mouth once daily before BREAKFAST 07/04/17   Einar Pheasant, MD  glucose blood test strip Use as instructed to blood sugar twice daily 06/28/16   Einar Pheasant, MD  levothyroxine (SYNTHROID, LEVOTHROID) 100 MCG tablet take 1 tablet by mouth once daily 01/05/17   Einar Pheasant, MD  lisinopril (PRINIVIL,ZESTRIL) 10 MG tablet Take 1 tablet (10 mg total) by mouth daily. 04/08/16   Einar Pheasant, MD  metFORMIN (GLUCOPHAGE-XR) 500 MG 24 hr tablet take 1 tablet by mouth twice a day with meal 04/08/16   Einar Pheasant, MD  pantoprazole (PROTONIX) 40 MG tablet take 1 tablet by mouth twice a day before meals 04/08/16   Einar Pheasant, MD    Allergies Actos [pioglitazone]; Macrobid [nitrofurantoin macrocrystal]; and Sulfa antibiotics  Family History  Problem Relation Age of Onset  . Brain cancer Mother        history of brain tumor  . Lung disease Father   . Diabetes Brother        x2  .  Bipolar disorder Son   . Alcohol abuse Son   . Breast cancer Neg Hx   . Colon cancer Neg Hx     Social History Social History   Tobacco Use  . Smoking status: Current Every Day Smoker    Packs/day: 1.00    Years: 50.00    Pack years: 50.00  . Smokeless tobacco: Never Used  Substance Use Topics  . Alcohol use: No    Alcohol/week: 0.0 oz  . Drug use: No    Review of Systems Constitutional: No fever/chills Eyes: No visual changes. ENT: No sore throat. No stiff neck no neck pain Cardiovascular: Denies chest pain. Respiratory: Denies shortness of breath. Gastrointestinal:   no vomiting.  No diarrhea.  No constipation. Genitourinary: Negative for dysuria. Musculoskeletal: Negative lower extremity swelling Skin: Negative for rash. Neurological: Negative for severe headaches, focal weakness or numbness.   ____________________________________________   PHYSICAL EXAM:  VITAL SIGNS: ED Triage Vitals  Enc Vitals Group     BP 07/10/17 1347 (!) 173/55     Pulse Rate 07/10/17 1347 (!) 55     Resp 07/10/17 1347 14     Temp 07/10/17 1347 98.1 F (36.7 C)     Temp Source 07/10/17 1347 Oral     SpO2 07/10/17 1347 99 %     Weight 07/10/17 1348 149 lb (67.6 kg)     Height 07/10/17 1348 5\' 2"  (1.575 m)     Head Circumference --      Peak Flow --      Pain Score 07/10/17 1348 0     Pain Loc --      Pain Edu? --      Excl. in [place]? --     Constitutional: Alert and oriented. Well appearing and in no acute distress. Eyes: Conjunctivae are normal Head: Atraumatic HEENT: No congestion/rhinnorhea. Mucous membranes are moist.  Oropharynx non-erythematous Neck:   Nontender with no meningismus, no masses, no stridor Cardiovascular: Normal rate, regular rhythm. Grossly normal heart sounds.  Good peripheral circulation. Respiratory: Normal respiratory effort.  No retractions. Lungs CTAB. Abdominal: Soft and nontender. No distention. No guarding no rebound Back:  There is no focal  tenderness or step off.  there is no midline tenderness there are no lesions noted. there is no CVA tenderness Musculoskeletal: No lower extremity tenderness, no upper extremity tenderness. No joint effusions, no DVT signs strong distal pulses no edema Neurologic:  Normal speech and language. No gross focal neurologic deficits are appreciated.  Skin:  Skin is warm, dry and intact. No rash noted. Psychiatric: Mood and affect are normal. Speech and behavior are normal.  ____________________________________________   LABS (all labs ordered are listed, but only abnormal results are displayed)  Labs Reviewed  BASIC METABOLIC PANEL - Abnormal;  Notable for the following components:      Result Value   Glucose, Bld 168 (*)    GFR calc non Af Amer 58 (*)    All other components within normal limits  CBC - Abnormal; Notable for the following components:   RDW 14.9 (*)    All other components within normal limits  HEPATIC FUNCTION PANEL - Abnormal; Notable for the following components:   Bilirubin, Direct <0.1 (*)    All other components within normal limits  TROPONIN I  LIPASE, BLOOD  TROPONIN I    Pertinent labs  results that were available during my care of the patient were reviewed by me and considered in my medical decision making (see chart for details). ____________________________________________  EKG  I personally interpreted any EKGs ordered by me or triage Sinus bradycardia rate 56 bpm, there are flipped T's laterally, no acute ST elevation or depression, compared to prior EKG performed in 2010, flipped T waves in lateral leads as noted.  No other acute change from prior, prior flipped T waves in V1 and V2 are now more normalized. ____________________________________________  RADIOLOGY  Pertinent labs & imaging results that were available during my care of the patient were reviewed by me and considered in my medical decision making (see chart for details). If possible, patient  and/or family made aware of any abnormal findings.  Dg Chest 2 View  Result Date: 07/10/2017 CLINICAL DATA:  Chest pain.  Hypertension. EXAM: CHEST - 2 VIEW COMPARISON:  Chest radiograph November 15, 2013 and chest CT Jun 30, 2017 FINDINGS: There is no edema or consolidation. Heart size and pulmonary vascularity are normal. No adenopathy. There is degenerative change in the thoracic spine. IMPRESSION: No edema or consolidation. Electronically Signed   By: Lowella Grip III M.D.   On: 07/10/2017 14:23   ____________________________________________    PROCEDURES  Procedure(s) performed: None  Procedures  Critical Care performed: None  ____________________________________________   INITIAL IMPRESSION / ASSESSMENT AND PLAN / ED COURSE  Pertinent labs & imaging results that were available during my care of the patient were reviewed by me and considered in my medical decision making (see chart for details).  Patient here with no symptoms, she does have an EKG which shows change from 2010 when her last EKG was obtained however there is no ST elevation or depression, her history is very inconsistent with a possible ACS presentation although given that she is diabetic we did do 2 sets of cardiac enzymes which are negative.  Patient I think does need an outpatient stress test for this months of food related discomfort and I think more importantly she needs to follow-up with GI for a endoscopy.  Given that I do not detect any acute intra-abdominal pathology and her blood work is normal I do not think an emergent CT scan in the emergency department is indicated.  Patient is very happy with this plan.  I did offer admission to the hospital given limitations of ER work-up and she refuses point-blank.  She states she actually wants to go home.  She understands the risk benefits and alternatives of admission versus discharge.  At this time, there does not appear to be clinical evidence to support the  diagnosis of pulmonary embolus, dissection, myocarditis, endocarditis, pericarditis, pericardial tamponade, acute coronary syndrome, pneumothorax, pneumonia, or any other acute intrathoracic pathology that will require admission or acute intervention. Nor is there evidence of any significant intra-abdominal pathology causing this discomfort.  Considering the patient's symptoms,  medical history, and physical examination today, I have low suspicion for cholecystitis or biliary pathology, pancreatitis, perforation or bowel obstruction, hernia, intra-abdominal abscess, AAA or dissection, volvulus or intussusception, mesenteric ischemia, ischemic gut, pyelonephritis or appendicitis.  Most likely, this is an esophageal/stomach issue I will advise that she is to keep taking the antiacids and we will refer her as an outpatient to cardiology as well as GI.  Extensive return precautions and follow-up given understood patient very comfortable with this plan.  ----------------------------------------- 6:24 PM on 07/10/2017 -----------------------------------------  DIsscussed with Dr. Humphrey Rolls  Of cardiology who will see the patient as an outpatient agrees management and discharge.    ____________________________________________   FINAL CLINICAL IMPRESSION(S) / ED DIAGNOSES  Final diagnoses:  None      This chart was dictated using voice recognition software.  Despite best efforts to proofread,  errors can occur which can change meaning.      Schuyler Amor, MD 07/10/17 1736    Schuyler Amor, MD 07/10/17 [phone]

## 2017-07-10 NOTE — Discharge Instructions (Signed)
No evidence today that you are having a heart attack, we are much more concerned about your esophagus and stomach, please follow closely with GI medicine listed above.  In addition, we noticed that your EKG is somewhat unusual but we have not seen an old one in many years and we cannot tell if this is new or old changes.  For this reason we do recommend that you follow-up with cardiologist.  We have offered admission to the hospital but you would rather go home this is certainly your choice but it does limit our ability to monitor you.  fortunately your cardiac enzymes have been negative here in the emergency department.  We did discuss with Dr. Chancy Milroy, who can see you on Thursday morning.  If you have any new or worrisome symptoms including chest pain, change in your esophageal discomfort with eating, fever or chills, return to the emergency room.  We will start you on medication, for antiacid, if you have any bleeding, black stool, fever, or you feel worse in any way of concern please return.

## 2017-07-10 NOTE — Assessment & Plan Note (Signed)
She does not take her medication regularly.  Has not been on metformin lately secondary to her current acute symptoms.  Just started back on glipizide.  Follow met b and a1c.

## 2017-07-10 NOTE — ED Triage Notes (Signed)
Says she was sent from office for abnormal ekg.   Was there for a recheck.  Says she does have substernal chest pain on and off for a long time --usually after eating that radiates to chin.  She says it feels like it did when she had gallbladder.

## 2017-07-10 NOTE — Assessment & Plan Note (Signed)
Has declined cholesterol medication.  Low cholesterol diet and exercise.  Follow lipid panel.   

## 2017-07-10 NOTE — Assessment & Plan Note (Signed)
Not taking protonix now.  Will need to restart.  Not sure if her pain could be more GI in origin, but given symptoms with associated EKG changes, need to rule out heart issues first.  Pt in agreement.  May need CT scan.

## 2017-07-11 ENCOUNTER — Telehealth: Payer: Self-pay | Admitting: Internal Medicine

## 2017-07-11 NOTE — Telephone Encounter (Signed)
Copied from [place] [id number]. Topic: Quick Communication - See Telephone Encounter >> Jul 11, 2017  9:06 AM Keene Breath wrote: CRM for notification. See Telephone encounter for: 07/11/17.  Sharyn Lull from [employer] called with abnormal cologuard results.  She also faxed the results to the office.  CB # [id number], Order [phone].

## 2017-07-11 NOTE — Telephone Encounter (Signed)
This is not a patient of Primary Care at [facility]

## 2017-07-11 NOTE — Telephone Encounter (Signed)
Results received and PCP aware. Fax was given to Dr. Nicki Reaper

## 2017-07-19 ENCOUNTER — Other Ambulatory Visit: Payer: Self-pay | Admitting: Internal Medicine

## 2017-07-19 ENCOUNTER — Telehealth: Payer: Self-pay

## 2017-07-19 DIAGNOSIS — R079 Chest pain, unspecified: Secondary | ICD-10-CM

## 2017-07-19 DIAGNOSIS — R9431 Abnormal electrocardiogram [ECG] [EKG]: Secondary | ICD-10-CM

## 2017-07-19 DIAGNOSIS — K2100 Gastro-esophageal reflux disease with esophagitis, without bleeding: Secondary | ICD-10-CM

## 2017-07-19 DIAGNOSIS — R195 Other fecal abnormalities: Secondary | ICD-10-CM

## 2017-07-19 NOTE — Telephone Encounter (Signed)
Called pt with positive cologuard. Patient does not have appt with cardiology or GI but has agreed to see both. She is not having any new sx since visit and stated she seems to be feeling better. Patient stated she did not want to go to [facility] but doesn't have preference for GI. Also, patient stated that she has a family member in [place] that is in critical condition so she may be going to [place] soon.

## 2017-07-19 NOTE — Telephone Encounter (Signed)
Copied from [place] [id number]. Topic: General - Call Back - No Documentation >> Jul 19, 2017  9:03 AM Nils Flack wrote: Reason for CRM: pt returned call - no crm  Please call [phone]

## 2017-07-19 NOTE — Telephone Encounter (Signed)
Left message making patient aware of referrals and to be evaluated if worsening symptoms

## 2017-07-19 NOTE — Telephone Encounter (Signed)
I have placed order for GI referral and cardiology referral.  Someone should be contacting her with an appt date and time.  If any change, worsening or return of symptoms, she is to be reevaluated.

## 2017-07-19 NOTE — Progress Notes (Signed)
Order placed for cardiology referral and for GI referral.

## 2017-07-19 NOTE — Telephone Encounter (Signed)
Left message for patient to call back regarding cologuard results.

## 2017-07-21 ENCOUNTER — Encounter: Payer: Self-pay | Admitting: Cardiovascular Disease

## 2017-07-21 ENCOUNTER — Ambulatory Visit (INDEPENDENT_AMBULATORY_CARE_PROVIDER_SITE_OTHER): Payer: Medicare Other | Admitting: Cardiovascular Disease

## 2017-07-21 VITALS — BP 168/78 | HR 54 | Ht 62.0 in | Wt 148.5 lb

## 2017-07-21 DIAGNOSIS — E785 Hyperlipidemia, unspecified: Secondary | ICD-10-CM | POA: Diagnosis not present

## 2017-07-21 DIAGNOSIS — R9431 Abnormal electrocardiogram [ECG] [EKG]: Secondary | ICD-10-CM

## 2017-07-21 DIAGNOSIS — I10 Essential (primary) hypertension: Secondary | ICD-10-CM | POA: Diagnosis not present

## 2017-07-21 DIAGNOSIS — R079 Chest pain, unspecified: Secondary | ICD-10-CM | POA: Diagnosis not present

## 2017-07-21 DIAGNOSIS — Z720 Tobacco use: Secondary | ICD-10-CM

## 2017-07-21 NOTE — Progress Notes (Signed)
Cardiology Office Note   Date:  07/21/2017   ID:  Ann Collins, DOB February 26, 1945, MRN [id number]  PCP:  Einar Pheasant, MD  Cardiologist:   Kathlyn Sacramento, MD   Chief Complaint  Patient presents with  . other    Ref by Dr. Einar Pheasant for chest pain and abnormal EKG. Pt. c/o being bloated, chest pain and abdominal pain. Meds reviewed by the pt. verbally.       History of Present Illness: Ann Collins is a 72 y.o. female who was referred by Dr. Nicki Reaper E for evaluation of chest and abnormal EKG. She has no prior cardiac history but has chronic medical conditions that include diabetes mellitus, hyperlipidemia,  tobacco use and hypertension. She has known history of esophageal erosions and dysphagia.  She recently had worsening abdominal pain in addition to burning sensation in her chest.  Symptoms were not relieved by antacids.  She had significant bloating.  She reports that the symptoms were very similar to when she had problems with her gallbladder.  However, she did have cholecystectomy in 2000.  Her EKG showed anterior T wave changes and thus she was sent to the emergency room for evaluation.  2 sets of troponin were negative. Since then, she reports that her symptoms have gradually improved without intervention.  However, she does complain of jaw pain with exertion which does not happen all the time.  She goes to the gym 3 times a week. She continues to exercise 1 pack/day and has been doing so for about 50 years. She reports family history of coronary artery disease but does not know the exact details.  She does have known history of hyperlipidemia but she has refused taking a statin in the past due to concerns about possible effects on memory.  Past Medical History:  Diagnosis Date  . Diabetes mellitus ([place])   . Diverticulosis   . GERD (gastroesophageal reflux disease)   . Hematuria   . Hypercholesterolemia   . Hypertension   . Hypothyroidism   . Kidney stones      Past Surgical History:  Procedure Laterality Date  . APPENDECTOMY    . BREAST BIOPSY  1969   right  . CHOLECYSTECTOMY  2005     Current Outpatient Medications  Medication Sig Dispense Refill  . glipiZIDE (GLUCOTROL XL) 2.5 MG 24 hr tablet take 1 tablet by mouth once daily before BREAKFAST 90 tablet 0  . glucose blood test strip Use as instructed to blood sugar twice daily 100 each 12  . levothyroxine (SYNTHROID, LEVOTHROID) 100 MCG tablet take 1 tablet by mouth once daily 90 tablet 1  . lisinopril (PRINIVIL,ZESTRIL) 10 MG tablet Take 1 tablet (10 mg total) by mouth daily. 90 tablet 3  . omeprazole (PRILOSEC) 40 MG capsule Take 1 capsule (40 mg total) by mouth 2 (two) times daily. 30 capsule 1   No current facility-administered medications for this visit.     Allergies:   Pioglitazone; Macrobid [nitrofurantoin macrocrystal]; Nitrofurantoin; Other; Sulfa antibiotics; and Sulfasalazine    Social History:  The patient  reports that she has been smoking.  She has a 50.00 pack-year smoking history. She has never used smokeless tobacco. She reports that she does not drink alcohol or use drugs.   Family History:  The patient's family history includes Alcohol abuse in her son; Bipolar disorder in her son; Brain cancer in her mother; Diabetes in her brother; Lung disease in her father.    ROS:  Please  see the history of present illness.   Otherwise, review of systems are positive for none.   All other systems are reviewed and negative.    PHYSICAL EXAM: VS:  BP (!) 168/78 (BP Location: Right Arm, Patient Position: Sitting, Cuff Size: Normal)   Pulse (!) 54   Ht 5\' 2"  (1.575 m)   Wt 148 lb 8 oz (67.4 kg)   LMP 03/10/1991   BMI 27.16 kg/m  , BMI Body mass index is 27.16 kg/m. GEN: Well nourished, well developed, in no acute distress  HEENT: normal  Neck: no JVD, carotid bruits, or masses Cardiac: RRR; no murmurs, rubs, or gallops,no edema  Respiratory:  clear to auscultation  bilaterally, normal work of breathing GI: soft, nontender, nondistended, + BS MS: no deformity or atrophy  Skin: warm and dry, no rash Neuro:  Strength and sensation are intact Psych: euthymic mood, full affect Distal pulses are palpable.   EKG:  EKG is ordered today. The ekg ordered today demonstrates  sinus bradycardia  with diffuse T wave changes.  Recent Labs: 12/06/2016: TSH 1.26 07/10/2017: ALT 14; BUN 16; Creatinine, Ser 0.96; Hemoglobin 14.3; Platelets 172; Potassium 4.8; Sodium 140    Lipid Panel    Component Value Date/Time   CHOL 215 (H) 03/13/2017 1438   CHOL 257 (H) 06/29/2012 0951   TRIG 144.0 03/13/2017 1438   HDL 49.80 03/13/2017 1438   HDL 59 06/29/2012 0951   CHOLHDL 4 03/13/2017 1438   VLDL 28.8 03/13/2017 1438   LDLCALC 136 (H) 03/13/2017 1438   LDLCALC 171 (H) 06/29/2012 0951   LDLDIRECT 156.0 08/29/2016 1523      Wt Readings from Last 3 Encounters:  07/21/17 148 lb 8 oz (67.4 kg)  07/10/17 149 lb (67.6 kg)  07/10/17 149 lb 12.8 oz (67.9 kg)       PAD Screen 07/21/2017  Previous PAD dx? No  Previous surgical procedure? No  Pain with walking? No  Feet/toe relief with dangling? No  Painful, non-healing ulcers? No  Extremities discolored? No      ASSESSMENT AND PLAN:  1.  Chest pain with intermediate risk probability of underlying coronary artery disease: The patient's abdominal pain and bloating was likely GI in nature.  This has improved gradually.  However, her exertional jaw pain and shortness of breath are concerning for possible underlying ischemic heart disease especially with multiple risk factors.  Her EKG shows T wave changes suggestive of ischemia. Due to all of the above, I recommend evaluation with a treadmill nuclear stress test. Even if her stress test is normal, I explained to the patient that she is at high risk for developing cardiovascular disease given her risk factors.  I discussed with her the importance of controlling these  risk factors.  2.  Hyperlipidemia: Most recent LDL was 139.  The patient was placed on small dose Crestor but the patient refused to take the medication.  I answered the patient's questions about potential side effects of statins and the overall risk benefit ratio.  I explained to her that given her lipid profile and diabetes, she should greatly benefit from being on a statin and there is a strong indication.  The patient wants to think about this and discuss with Dr. Nicki Reaper.  I recommend a target LDL of less than 70 if she agrees to go on a statin.  3.  Tobacco use: I discussed with her the importance of smoking cessation.  4.  Essential hypertension: Blood pressure is elevated today  but she reports controlled readings at home.  Continue to monitor for now.    Disposition:   FU with me as needed.  Signed,  Kathlyn Sacramento, MD  07/21/2017 8:34 AM    [place]

## 2017-07-21 NOTE — Patient Instructions (Addendum)
Medication Instructions:  Your physician recommends that you continue on your current medications as directed. Please refer to the Current Medication list given to you today.   Labwork: none  Testing/Procedures: [facility] TREADMILL MYOVIEW  Your caregiver has ordered a Stress Test with nuclear imaging. The purpose of this test is to evaluate the blood supply to your heart muscle. This procedure is referred to as a "Non-Invasive Stress Test." This is because other than having an IV started in your vein, nothing is inserted or "invades" your body. Cardiac stress tests are done to find areas of poor blood flow to the heart by determining the extent of coronary artery disease (CAD). Some patients exercise on a treadmill, which naturally increases the blood flow to your heart, while others who are  unable to walk on a treadmill due to physical limitations have a pharmacologic/chemical stress agent called Lexiscan . This medicine will mimic walking on a treadmill by temporarily increasing your coronary blood flow.   Please note: these test may take anywhere between 2-4 hours to complete  PLEASE REPORT TO [place] AT THE FIRST DESK WILL DIRECT YOU WHERE TO GO  Date of Procedure:_____________________________________  Arrival Time for Procedure:______________________________  Instructions regarding medication:   _yes_ : Hold diabetes (Glipizide) medication morning of procedure   PLEASE NOTIFY THE OFFICE AT LEAST 24 HOURS IN ADVANCE IF YOU ARE UNABLE TO KEEP YOUR APPOINTMENT.  [phone] AND  PLEASE NOTIFY NUCLEAR MEDICINE AT [facility] AT LEAST 24 HOURS IN ADVANCE IF YOU ARE UNABLE TO KEEP YOUR APPOINTMENT. [phone]  How to prepare for your Myoview test:  1. Do not eat or drink after midnight 2. No caffeine for 24 hours prior to test 3. No smoking 24 hours prior to test. 4. Your medication may be taken with water.  If your doctor stopped a medication because of  this test, do not take that medication. 5. Ladies, please do not wear dresses.  Skirts or pants are appropriate. Please wear a short sleeve shirt. 6. No perfume, cologne or lotion. 7. Wear comfortable walking shoes. No heels!      Follow-Up: Your physician recommends that you schedule a follow-up appointment in: as needed.   Cardiac Nuclear Scan A cardiac nuclear scan is a test that measures blood flow to the heart when a person is resting and when he or she is exercising. The test looks for problems such as:  Not enough blood reaching a portion of the heart.  The heart muscle not working normally.  You may need this test if:  You have heart disease.  You have had abnormal lab results.  You have had heart surgery or angioplasty.  You have chest pain.  You have shortness of breath.  In this test, a radioactive dye (tracer) is injected into your bloodstream. After the tracer has traveled to your heart, an imaging device is used to measure how much of the tracer is absorbed by or distributed to various areas of your heart. This procedure is usually done at a hospital and takes 2-4 hours. Tell a health care provider about:  Any allergies you have.  All medicines you are taking, including vitamins, herbs, eye drops, creams, and over-the-counter medicines.  Any problems you or family members have had with the use of anesthetic medicines.  Any blood disorders you have.  Any surgeries you have had.  Any medical conditions you have.  Whether you are pregnant or may be pregnant. What are the  risks? Generally, this is a safe procedure. However, problems may occur, including:  Serious chest pain and heart attack. This is only a risk if the stress portion of the test is done.  Rapid heartbeat.  Sensation of warmth in your chest. This usually passes quickly.  What happens before the procedure?  Ask your health care provider about changing or stopping your regular  medicines. This is especially important if you are taking diabetes medicines or blood thinners.  Remove your jewelry on the day of the procedure. What happens during the procedure?  An IV tube will be inserted into one of your veins.  Your health care provider will inject a small amount of radioactive tracer through the tube.  You will wait for 20-40 minutes while the tracer travels through your bloodstream.  Your heart activity will be monitored with an electrocardiogram (ECG).  You will lie down on an exam table.  Images of your heart will be taken for about 15-20 minutes.  You may be asked to exercise on a treadmill or stationary bike. While you exercise, your heart's activity will be monitored with an ECG, and your blood pressure will be checked. If you are unable to exercise, you may be given a medicine to increase blood flow to parts of your heart.  When blood flow to your heart has peaked, a tracer will again be injected through the IV tube.  After 20-40 minutes, you will get back on the exam table and have more images taken of your heart.  When the procedure is over, your IV tube will be removed. The procedure may vary among health care providers and hospitals. Depending on the type of tracer used, scans may need to be repeated 3-4 hours later. What happens after the procedure?  Unless your health care provider tells you otherwise, you may return to your normal schedule, including diet, activities, and medicines.  Unless your health care provider tells you otherwise, you may increase your fluid intake. This will help flush the contrast dye from your body. Drink enough fluid to keep your urine clear or pale yellow.  It is up to you to get your test results. Ask your health care provider, or the department that is doing the test, when your results will be ready. Summary  A cardiac nuclear scan measures the blood flow to the heart when a person is resting and when he or she is  exercising.  You may need this test if you are at risk for heart disease.  Tell your health care provider if you are pregnant.  Unless your health care provider tells you otherwise, increase your fluid intake. This will help flush the contrast dye from your body. Drink enough fluid to keep your urine clear or pale yellow. This information is not intended to replace advice given to you by your health care provider. Make sure you discuss any questions you have with your health care provider. Document Released: 02/19/2004 Document Revised: 01/27/2016 Document Reviewed: 01/02/2013 Elsevier Interactive Patient Education  2017 [employer].

## 2017-07-21 NOTE — H&P (View-Only) (Signed)
Cardiology Office Note   Date:  07/21/2017   ID:  Ann Collins, DOB Jul 30, 1945, MRN [id number]  PCP:  Ann Pheasant, MD  Cardiologist:   Ann Sacramento, MD   Chief Complaint  Patient presents with  . other    Ref by Ann Collins for chest pain and abnormal EKG. Pt. c/o being bloated, chest pain and abdominal pain. Meds reviewed by the pt. verbally.       History of Present Illness: Ann Collins is a 72 y.o. female who was referred by Dr. Nicki Reaper Collins for evaluation of chest and abnormal EKG. She has no prior cardiac history but has chronic medical conditions that include diabetes mellitus, hyperlipidemia,  tobacco use and hypertension. She has known history of esophageal erosions and dysphagia.  She recently had worsening abdominal pain in addition to burning sensation in her chest.  Symptoms were not relieved by antacids.  She had significant bloating.  She reports that the symptoms were very similar to when she had problems with her gallbladder.  However, she did have cholecystectomy in 2000.  Her EKG showed anterior T wave changes and thus she was sent to the emergency room for evaluation.  2 sets of troponin were negative. Since then, she reports that her symptoms have gradually improved without intervention.  However, she does complain of jaw pain with exertion which does not happen all the time.  She goes to the gym 3 times a week. She continues to exercise 1 pack/day and has been doing so for about 50 years. She reports family history of coronary artery disease but does not know the exact details.  She does have known history of hyperlipidemia but she has refused taking a statin in the past due to concerns about possible effects on memory.  Past Medical History:  Diagnosis Date  . Diabetes mellitus ([place])   . Diverticulosis   . GERD (gastroesophageal reflux disease)   . Hematuria   . Hypercholesterolemia   . Hypertension   . Hypothyroidism   . Kidney stones      Past Surgical History:  Procedure Laterality Date  . APPENDECTOMY    . BREAST BIOPSY  1969   right  . CHOLECYSTECTOMY  2005     Current Outpatient Medications  Medication Sig Dispense Refill  . glipiZIDE (GLUCOTROL XL) 2.5 MG 24 hr tablet take 1 tablet by mouth once daily before BREAKFAST 90 tablet 0  . glucose blood test strip Use as instructed to blood sugar twice daily 100 each 12  . levothyroxine (SYNTHROID, LEVOTHROID) 100 MCG tablet take 1 tablet by mouth once daily 90 tablet 1  . lisinopril (PRINIVIL,ZESTRIL) 10 MG tablet Take 1 tablet (10 mg total) by mouth daily. 90 tablet 3  . omeprazole (PRILOSEC) 40 MG capsule Take 1 capsule (40 mg total) by mouth 2 (two) times daily. 30 capsule 1   No current facility-administered medications for this visit.     Allergies:   Pioglitazone; Macrobid [nitrofurantoin macrocrystal]; Nitrofurantoin; Other; Sulfa antibiotics; and Sulfasalazine    Social History:  The patient  reports that she has been smoking.  She has a 50.00 pack-year smoking history. She has never used smokeless tobacco. She reports that she does not drink alcohol or use drugs.   Family History:  The patient's family history includes Alcohol abuse in her son; Bipolar disorder in her son; Brain cancer in her mother; Diabetes in her brother; Lung disease in her father.    ROS:  Please  see the history of present illness.   Otherwise, review of systems are positive for none.   All other systems are reviewed and negative.    PHYSICAL EXAM: VS:  BP (!) 168/78 (BP Location: Right Arm, Patient Position: Sitting, Cuff Size: Normal)   Pulse (!) 54   Ht 5\' 2"  (1.575 m)   Wt 148 lb 8 oz (67.4 kg)   LMP 03/10/1991   BMI 27.16 kg/m  , BMI Body mass index is 27.16 kg/m. GEN: Well nourished, well developed, in no acute distress  HEENT: normal  Neck: no JVD, carotid bruits, or masses Cardiac: RRR; no murmurs, rubs, or gallops,no edema  Respiratory:  clear to auscultation  bilaterally, normal work of breathing GI: soft, nontender, nondistended, + BS MS: no deformity or atrophy  Skin: warm and dry, no rash Neuro:  Strength and sensation are intact Psych: euthymic mood, full affect Distal pulses are palpable.   EKG:  EKG is ordered today. The ekg ordered today demonstrates  sinus bradycardia  with diffuse T wave changes.  Recent Labs: 12/06/2016: TSH 1.26 07/10/2017: ALT 14; BUN 16; Creatinine, Ser 0.96; Hemoglobin 14.3; Platelets 172; Potassium 4.8; Sodium 140    Lipid Panel    Component Value Date/Time   CHOL 215 (H) 03/13/2017 1438   CHOL 257 (H) 06/29/2012 0951   TRIG 144.0 03/13/2017 1438   HDL 49.80 03/13/2017 1438   HDL 59 06/29/2012 0951   CHOLHDL 4 03/13/2017 1438   VLDL 28.8 03/13/2017 1438   LDLCALC 136 (H) 03/13/2017 1438   LDLCALC 171 (H) 06/29/2012 0951   LDLDIRECT 156.0 08/29/2016 1523      Wt Readings from Last 3 Encounters:  07/21/17 148 lb 8 oz (67.4 kg)  07/10/17 149 lb (67.6 kg)  07/10/17 149 lb 12.8 oz (67.9 kg)       PAD Screen 07/21/2017  Previous PAD dx? No  Previous surgical procedure? No  Pain with walking? No  Feet/toe relief with dangling? No  Painful, non-healing ulcers? No  Extremities discolored? No      ASSESSMENT AND PLAN:  1.  Chest pain with intermediate risk probability of underlying coronary artery disease: The patient's abdominal pain and bloating was likely GI in nature.  This has improved gradually.  However, her exertional jaw pain and shortness of breath are concerning for possible underlying ischemic heart disease especially with multiple risk factors.  Her EKG shows T wave changes suggestive of ischemia. Due to all of the above, I recommend evaluation with a treadmill nuclear stress test. Even if her stress test is normal, I explained to the patient that she is at high risk for developing cardiovascular disease given her risk factors.  I discussed with her the importance of controlling these  risk factors.  2.  Hyperlipidemia: Most recent LDL was 139.  The patient was placed on small dose Crestor but the patient refused to take the medication.  I answered the patient's questions about potential side effects of statins and the overall risk benefit ratio.  I explained to her that given her lipid profile and diabetes, she should greatly benefit from being on a statin and there is a strong indication.  The patient wants to think about this and discuss with Dr. Nicki Reaper.  I recommend a target LDL of less than 70 if she agrees to go on a statin.  3.  Tobacco use: I discussed with her the importance of smoking cessation.  4.  Essential hypertension: Blood pressure is elevated today  but she reports controlled readings at home.  Continue to monitor for now.    Disposition:   FU with me as needed.  Signed,  Ann Sacramento, MD  07/21/2017 8:34 AM    [place]

## 2017-07-24 ENCOUNTER — Other Ambulatory Visit: Payer: Self-pay | Admitting: Cardiovascular Disease

## 2017-07-24 DIAGNOSIS — R9431 Abnormal electrocardiogram [ECG] [EKG]: Secondary | ICD-10-CM

## 2017-07-26 ENCOUNTER — Encounter
Admission: RE | Admit: 2017-07-26 | Discharge: 2017-07-26 | Disposition: A | Discharge status: Home / Self Care | Payer: Medicare Other | Source: Ambulatory Visit | Attending: Cardiovascular Disease | Admitting: Cardiovascular Disease

## 2017-07-26 DIAGNOSIS — R9431 Abnormal electrocardiogram [ECG] [EKG]: Secondary | ICD-10-CM

## 2017-07-26 MED ORDER — TECHNETIUM TC 99M TETROFOSMIN IV KIT
30.8800 | PACK | Freq: Once | INTRAVENOUS | Status: AC | PRN
  Administered 2017-07-26: 30.88 via INTRAVENOUS

## 2017-07-26 MED ORDER — TECHNETIUM TC 99M TETROFOSMIN IV KIT
13.0000 | PACK | Freq: Once | INTRAVENOUS | Status: AC | PRN
  Administered 2017-07-26: 13.76 via INTRAVENOUS

## 2017-07-27 ENCOUNTER — Other Ambulatory Visit: Payer: Medicare Other

## 2017-07-27 LAB — NM MYOCAR MULTI W/SPECT W/WALL MOTION / EF
CHL CUP MPHR: 149 {beats}/min
CHL CUP NUCLEAR SRS: 0
CHL CUP NUCLEAR SSS: 10
CHL CUP RESTING HR STRESS: 49 {beats}/min
CSEPEW: 10.1 METS
CSEPPHR: 139 {beats}/min
Exercise duration (min): 8 min
Exercise duration (sec): 1 s
LV dias vol: 72 mL (ref 46–106)
LVSYSVOL: 34 mL
NUC STRESS TID: 1.12
Percent HR: 93 %
SDS: 10

## 2017-08-01 ENCOUNTER — Encounter: Payer: Self-pay | Admitting: *Deleted

## 2017-08-01 ENCOUNTER — Telehealth: Payer: Self-pay | Admitting: Cardiovascular Disease

## 2017-08-01 DIAGNOSIS — Z01818 Encounter for other preprocedural examination: Secondary | ICD-10-CM

## 2017-08-01 DIAGNOSIS — R079 Chest pain, unspecified: Secondary | ICD-10-CM

## 2017-08-01 NOTE — Addendum Note (Signed)
Addended by: Ricci Barker on: 08/01/2017 03:07 PM   Modules accepted: Orders

## 2017-08-01 NOTE — Telephone Encounter (Signed)
Returned the call to the patient. She has been scheduled for a left heart cath on 08/07/17 at 9:30 with Dr. Fletcher Anon. She will come by Thursday to the [facility] to have labs drawn. Instructions have been sent to MyChart per her request and have been gone over on the phone with her. She has been instructed to call back if she has any further questions.

## 2017-08-01 NOTE — Telephone Encounter (Signed)
New message    Patient returning call to schedule procedure

## 2017-08-02 DIAGNOSIS — L821 Other seborrheic keratosis: Secondary | ICD-10-CM | POA: Diagnosis not present

## 2017-08-02 DIAGNOSIS — L814 Other melanin hyperpigmentation: Secondary | ICD-10-CM | POA: Diagnosis not present

## 2017-08-02 DIAGNOSIS — Z85828 Personal history of other malignant neoplasm of skin: Secondary | ICD-10-CM | POA: Diagnosis not present

## 2017-08-03 ENCOUNTER — Other Ambulatory Visit
Admission: RE | Admit: 2017-08-03 | Discharge: 2017-08-03 | Disposition: A | Discharge status: Home / Self Care | Payer: Medicare Other | Source: Ambulatory Visit | Attending: Cardiovascular Disease | Admitting: Cardiovascular Disease

## 2017-08-03 DIAGNOSIS — Z01818 Encounter for other preprocedural examination: Secondary | ICD-10-CM

## 2017-08-03 DIAGNOSIS — R079 Chest pain, unspecified: Secondary | ICD-10-CM | POA: Diagnosis not present

## 2017-08-03 LAB — CBC
HEMATOCRIT: 40 % (ref 35.0–47.0)
HEMOGLOBIN: 13.4 g/dL (ref 12.0–16.0)
MCH: 29.8 pg (ref 26.0–34.0)
MCHC: 33.5 g/dL (ref 32.0–36.0)
MCV: 88.9 fL (ref 80.0–100.0)
Platelets: 153 10*3/uL (ref 150–440)
RBC: 4.5 MIL/uL (ref 3.80–5.20)
RDW: 15.2 % — ABNORMAL HIGH (ref 11.5–14.5)
WBC: 7.3 10*3/uL (ref 3.6–11.0)

## 2017-08-03 LAB — BASIC METABOLIC PANEL
ANION GAP: 9 (ref 5–15)
BUN: 17 mg/dL (ref 8–23)
CHLORIDE: 105 mmol/L (ref 98–111)
CO2: 24 mmol/L (ref 22–32)
Calcium: 9.2 mg/dL (ref 8.9–10.3)
Creatinine, Ser: 1 mg/dL (ref 0.44–1.00)
GFR calc non Af Amer: 55 mL/min — ABNORMAL LOW (ref >60)
Glucose, Bld: 193 mg/dL — ABNORMAL HIGH (ref 70–99)
POTASSIUM: 4.6 mmol/L (ref 3.5–5.1)
SODIUM: 138 mmol/L (ref 135–145)

## 2017-08-07 ENCOUNTER — Encounter
Admission: RE | Disposition: A | Discharge status: Home / Self Care | Payer: Self-pay | Source: Ambulatory Visit | Attending: Cardiovascular Disease

## 2017-08-07 ENCOUNTER — Ambulatory Visit
Admission: RE | Admit: 2017-08-07 | Discharge: 2017-08-07 | Disposition: A | Discharge status: Home / Self Care | Payer: Medicare Other | Source: Ambulatory Visit | Attending: Cardiovascular Disease | Admitting: Cardiovascular Disease

## 2017-08-07 DIAGNOSIS — I2510 Atherosclerotic heart disease of native coronary artery without angina pectoris: Secondary | ICD-10-CM

## 2017-08-07 DIAGNOSIS — Z881 Allergy status to other antibiotic agents status: Secondary | ICD-10-CM | POA: Insufficient documentation

## 2017-08-07 DIAGNOSIS — R9431 Abnormal electrocardiogram [ECG] [EKG]: Secondary | ICD-10-CM | POA: Insufficient documentation

## 2017-08-07 DIAGNOSIS — K219 Gastro-esophageal reflux disease without esophagitis: Secondary | ICD-10-CM | POA: Diagnosis not present

## 2017-08-07 DIAGNOSIS — R140 Abdominal distension (gaseous): Secondary | ICD-10-CM | POA: Insufficient documentation

## 2017-08-07 DIAGNOSIS — Z87442 Personal history of urinary calculi: Secondary | ICD-10-CM | POA: Insufficient documentation

## 2017-08-07 DIAGNOSIS — Z7984 Long term (current) use of oral hypoglycemic drugs: Secondary | ICD-10-CM | POA: Insufficient documentation

## 2017-08-07 DIAGNOSIS — E7800 Pure hypercholesterolemia, unspecified: Secondary | ICD-10-CM | POA: Diagnosis not present

## 2017-08-07 DIAGNOSIS — Z833 Family history of diabetes mellitus: Secondary | ICD-10-CM | POA: Insufficient documentation

## 2017-08-07 DIAGNOSIS — Z79899 Other long term (current) drug therapy: Secondary | ICD-10-CM | POA: Diagnosis not present

## 2017-08-07 DIAGNOSIS — I25119 Atherosclerotic heart disease of native coronary artery with unspecified angina pectoris: Secondary | ICD-10-CM | POA: Insufficient documentation

## 2017-08-07 DIAGNOSIS — Z9889 Other specified postprocedural states: Secondary | ICD-10-CM | POA: Insufficient documentation

## 2017-08-07 DIAGNOSIS — I10 Essential (primary) hypertension: Secondary | ICD-10-CM | POA: Diagnosis not present

## 2017-08-07 DIAGNOSIS — Z9114 Patient's other noncompliance with medication regimen: Secondary | ICD-10-CM | POA: Diagnosis not present

## 2017-08-07 DIAGNOSIS — Z9049 Acquired absence of other specified parts of digestive tract: Secondary | ICD-10-CM | POA: Insufficient documentation

## 2017-08-07 DIAGNOSIS — E039 Hypothyroidism, unspecified: Secondary | ICD-10-CM | POA: Insufficient documentation

## 2017-08-07 DIAGNOSIS — Z8249 Family history of ischemic heart disease and other diseases of the circulatory system: Secondary | ICD-10-CM | POA: Diagnosis not present

## 2017-08-07 DIAGNOSIS — Z882 Allergy status to sulfonamides status: Secondary | ICD-10-CM | POA: Diagnosis not present

## 2017-08-07 DIAGNOSIS — F17210 Nicotine dependence, cigarettes, uncomplicated: Secondary | ICD-10-CM | POA: Diagnosis not present

## 2017-08-07 DIAGNOSIS — R109 Unspecified abdominal pain: Secondary | ICD-10-CM | POA: Insufficient documentation

## 2017-08-07 DIAGNOSIS — Z79890 Hormone replacement therapy (postmenopausal): Secondary | ICD-10-CM | POA: Diagnosis not present

## 2017-08-07 DIAGNOSIS — E119 Type 2 diabetes mellitus without complications: Secondary | ICD-10-CM | POA: Insufficient documentation

## 2017-08-07 DIAGNOSIS — R079 Chest pain, unspecified: Secondary | ICD-10-CM

## 2017-08-07 DIAGNOSIS — R0789 Other chest pain: Secondary | ICD-10-CM | POA: Diagnosis present

## 2017-08-07 HISTORY — PX: LEFT HEART CATH AND CORONARY ANGIOGRAPHY: CATH118249

## 2017-08-07 LAB — PROTIME-INR
INR: 0.87
PROTHROMBIN TIME: 11.8 s (ref 11.4–15.2)

## 2017-08-07 LAB — GLUCOSE, CAPILLARY: GLUCOSE-CAPILLARY: 156 mg/dL — AB (ref 70–99)

## 2017-08-07 SURGERY — LEFT HEART CATH AND CORONARY ANGIOGRAPHY
Anesthesia: Moderate Sedation

## 2017-08-07 SURGERY — LEFT HEART CATH AND CORONARY ANGIOGRAPHY
Anesthesia: Moderate Sedation | Laterality: Left

## 2017-08-07 MED ORDER — VERAPAMIL HCL 2.5 MG/ML IV SOLN
INTRAVENOUS | Status: AC
Start: 2017-08-07 — End: ?
  Filled 2017-08-07: qty 2

## 2017-08-07 MED ORDER — HEPARIN (PORCINE) IN NACL 1000-0.9 UT/500ML-% IV SOLN
INTRAVENOUS | Status: AC
  Filled 2017-08-07: qty 500

## 2017-08-07 MED ORDER — SODIUM CHLORIDE 0.9 % WEIGHT BASED INFUSION
3.0000 mL/kg/h | INTRAVENOUS | Status: AC
  Administered 2017-08-07: 3 mL/kg/h via INTRAVENOUS

## 2017-08-07 MED ORDER — ATORVASTATIN CALCIUM 40 MG PO TABS: 40.0000 mg | ORAL_TABLET | Freq: Every day | ORAL | 6 refills | Status: DC

## 2017-08-07 MED ORDER — FENTANYL CITRATE (PF) 100 MCG/2ML IJ SOLN
INTRAMUSCULAR | Status: AC
Start: 2017-08-07 — End: ?
  Filled 2017-08-07: qty 2

## 2017-08-07 MED ORDER — VERAPAMIL HCL 2.5 MG/ML IV SOLN
INTRAVENOUS | Status: DC | PRN
  Administered 2017-08-07 (×2): 2.5 mg via INTRA_ARTERIAL

## 2017-08-07 MED ORDER — CARVEDILOL 3.125 MG PO TABS: 3.1250 mg | ORAL_TABLET | Freq: Two times a day (BID) | ORAL | 3 refills | Status: DC

## 2017-08-07 MED ORDER — ASPIRIN 81 MG PO CHEW: 81.0000 mg | CHEWABLE_TABLET | Freq: Once | ORAL | Status: DC

## 2017-08-07 MED ORDER — MIDAZOLAM HCL 2 MG/2ML IJ SOLN
INTRAMUSCULAR | Status: AC
  Filled 2017-08-07: qty 2

## 2017-08-07 MED ORDER — ASPIRIN 81 MG PO CHEW: 81.0000 mg | CHEWABLE_TABLET | ORAL | Status: DC

## 2017-08-07 MED ORDER — SODIUM CHLORIDE 0.9 % WEIGHT BASED INFUSION: 1.0000 mL/kg/h | INTRAVENOUS | Status: DC

## 2017-08-07 MED ORDER — SODIUM CHLORIDE 0.9 % IV SOLN: 250.0000 mL | INTRAVENOUS | Status: DC | PRN

## 2017-08-07 MED ORDER — SODIUM CHLORIDE 0.9% FLUSH: 3.0000 mL | Freq: Two times a day (BID) | INTRAVENOUS | Status: DC

## 2017-08-07 MED ORDER — MIDAZOLAM HCL 2 MG/2ML IJ SOLN
INTRAMUSCULAR | Status: DC | PRN
  Administered 2017-08-07: 1 mg via INTRAVENOUS

## 2017-08-07 MED ORDER — SODIUM CHLORIDE 0.9% FLUSH: 3.0000 mL | INTRAVENOUS | Status: DC | PRN

## 2017-08-07 MED ORDER — HEPARIN SODIUM (PORCINE) 1000 UNIT/ML IJ SOLN
INTRAMUSCULAR | Status: DC | PRN
  Administered 2017-08-07: 3500 [IU] via INTRAVENOUS

## 2017-08-07 MED ORDER — HEPARIN SODIUM (PORCINE) 1000 UNIT/ML IJ SOLN
INTRAMUSCULAR | Status: AC
  Filled 2017-08-07: qty 1

## 2017-08-07 MED ORDER — LIDOCAINE HCL (PF) 1 % IJ SOLN
INTRAMUSCULAR | Status: AC
  Filled 2017-08-07: qty 30

## 2017-08-07 MED ORDER — FENTANYL CITRATE (PF) 100 MCG/2ML IJ SOLN
INTRAMUSCULAR | Status: DC | PRN
  Administered 2017-08-07: 25 ug via INTRAVENOUS

## 2017-08-07 SURGICAL SUPPLY — 9 items
CATH INFINITI 5FR ANG PIGTAIL (CATHETERS) ×3 IMPLANT
CATH INFINITI 5FR JK (CATHETERS) ×3 IMPLANT
DEVICE RAD TR BAND REGULAR (VASCULAR PRODUCTS) ×3 IMPLANT
KIT MANI 3VAL PERCEP (MISCELLANEOUS) ×3 IMPLANT
NEEDLE PERC 21GX4CM (NEEDLE) ×3 IMPLANT
PACK CARDIAC CATH (CUSTOM PROCEDURE TRAY) ×3 IMPLANT
SHEATH RAIN RADIAL 21G 6FR (SHEATH) ×3 IMPLANT
WIRE HITORQ VERSACORE ST 145CM (WIRE) ×3 IMPLANT
WIRE ROSEN-J .035X260CM (WIRE) ×3 IMPLANT

## 2017-08-07 NOTE — Progress Notes (Signed)
Patient clinically stable post heart cath per Dr Fletcher Anon. Husband at bedside. Sinus rhythm per monitor. Denies complaints. DR Fletcher Anon out to speak with patient with questions answered. To treat medically at this time. Right arm elevated on pillow with TR band intact with 12 ml air instilled.

## 2017-08-07 NOTE — Progress Notes (Signed)
Patient remains clinically stable post heart cath. TR band intact with all air removed at this time. No bleeding nor hematoma at radial site.  Discharge instructions given with questions answered.

## 2017-08-07 NOTE — Discharge Instructions (Signed)
Moderate Conscious Sedation, Adult, Care After These instructions provide you with information about caring for yourself after your procedure. Your health care provider may also give you more specific instructions. Your treatment has been planned according to current medical practices, but problems sometimes occur. Call your health care provider if you have any problems or questions after your procedure. What can I expect after the procedure? After your procedure, it is common:  To feel sleepy for several hours.  To feel clumsy and have poor balance for several hours.  To have poor judgment for several hours.  To vomit if you eat too soon.  Follow these instructions at home: For at least 24 hours after the procedure:   Do not: ? Participate in activities where you could fall or become injured. ? Drive. ? Use heavy machinery. ? Drink alcohol. ? Take sleeping pills or medicines that cause drowsiness. ? Make important decisions or sign legal documents. ? Take care of children on your own.  Rest. Eating and drinking  Follow the diet recommended by your health care provider.  If you vomit: ? Drink water, juice, or soup when you can drink without vomiting. ? Make sure you have little or no nausea before eating solid foods. General instructions  Have a responsible adult stay with you until you are awake and alert.  Take over-the-counter and prescription medicines only as told by your health care provider.  If you smoke, do not smoke without supervision.  Keep all follow-up visits as told by your health care provider. This is important. Contact a health care provider if:  You keep feeling nauseous or you keep vomiting.  You feel light-headed.  You develop a rash.  You have a fever. Get help right away if:  You have trouble breathing. This information is not intended to replace advice given to you by your health care provider. Make sure you discuss any questions you have  with your health care provider. Document Released: 11/14/2012 Document Revised: 06/29/2015 Document Reviewed: 05/16/2015 Elsevier Interactive Patient Education  2018 [place] Refer to this sheet in the next few weeks. These instructions provide you with information about caring for yourself after your procedure. Your health care provider may also give you more specific instructions. Your treatment has been planned according to current medical practices, but problems sometimes occur. Call your health care provider if you have any problems or questions after your procedure. What can I expect after the procedure? After your procedure, it is typical to have the following:  Bruising at the radial site that usually fades within 1-2 weeks.  Blood collecting in the tissue (hematoma) that may be painful to the touch. It should usually decrease in size and tenderness within 1-2 weeks.  Follow these instructions at home:  Take medicines only as directed by your health care provider.  You may shower 24-48 hours after the procedure or as directed by your health care provider. Remove the bandage (dressing) and gently wash the site with plain soap and water. Pat the area dry with a clean towel. Do not rub the site, because this may cause bleeding.  Do not take baths, swim, or use a hot tub until your health care provider approves.  Check your insertion site every day for redness, swelling, or drainage.  Do not apply powder or lotion to the site.  Do not flex or bend the affected arm for 24 hours or as directed by your health care provider.  Do not  push or pull heavy objects with the affected arm for 24 hours or as directed by your health care provider.  Do not lift over 10 lb (4.5 kg) for 5 days after your procedure or as directed by your health care provider.  Ask your health care provider when it is okay to: ? Return to work or school. ? Resume usual physical activities or  sports. ? Resume sexual activity.  Do not drive home if you are discharged the same day as the procedure. Have someone else drive you.  You may drive 24 hours after the procedure unless otherwise instructed by your health care provider.  Do not operate machinery or power tools for 24 hours after the procedure.  If your procedure was done as an outpatient procedure, which means that you went home the same day as your procedure, a responsible adult should be with you for the first 24 hours after you arrive home.  Keep all follow-up visits as directed by your health care provider. This is important. Contact a health care provider if:  You have a fever.  You have chills.  You have increased bleeding from the radial site. Hold pressure on the site. Get help right away if:  You have unusual pain at the radial site.  You have redness, warmth, or swelling at the radial site.  You have drainage (other than a small amount of blood on the dressing) from the radial site.  The radial site is bleeding, and the bleeding does not stop after 30 minutes of holding steady pressure on the site.  Your arm or hand becomes pale, cool, tingly, or numb. This information is not intended to replace advice given to you by your health care provider. Make sure you discuss any questions you have with your health care provider. Document Released: 02/26/2010 Document Revised: 07/02/2015 Document Reviewed: 08/12/2013 Elsevier Interactive Patient Education  2018 [employer]  

## 2017-08-07 NOTE — Interval H&P Note (Signed)
Cath Lab Visit (complete for each Cath Lab visit)  Clinical Evaluation Leading to the Procedure:   ACS: No.  Non-ACS:    Anginal Classification: CCS II  Anti-ischemic medical therapy: Minimal Therapy (1 class of medications)  Non-Invasive Test Results: Intermediate-risk stress test findings: cardiac mortality 1-3%/year  Prior CABG: No previous CABG      History and Physical Interval Note:  08/07/2017 10:41 AM  Ann Collins  has presented today for surgery, with the diagnosis of LT Heart Cath   Chest pain   Abnormal EKG  The various methods of treatment have been discussed with the patient and family. After consideration of risks, benefits and other options for treatment, the patient has consented to  Procedure(s): LEFT HEART CATH AND CORONARY ANGIOGRAPHY (Left) as a surgical intervention .  The patient's history has been reviewed, patient examined, no change in status, stable for surgery.  I have reviewed the patient's chart and labs.  Questions were answered to the patient's satisfaction.     Kathlyn [place]

## 2017-08-15 ENCOUNTER — Telehealth: Payer: Self-pay | Admitting: Cardiovascular Disease

## 2017-08-15 DIAGNOSIS — R195 Other fecal abnormalities: Secondary | ICD-10-CM | POA: Diagnosis not present

## 2017-08-15 DIAGNOSIS — R1013 Epigastric pain: Secondary | ICD-10-CM | POA: Diagnosis not present

## 2017-08-15 NOTE — Telephone Encounter (Signed)
° °  [facility] Pre-operative Risk Assessment    Request for surgical clearance:  1. What type of surgery is being performed? Colonoscopy and EGD   2. When is this surgery scheduled? 09/19/17   3. What type of clearance is required (medical clearance vs. Pharmacy clearance to hold med vs. Both)? Both  4. Are there any medications that need to be held prior to surgery and how long? Coumadin ( did not say for how long)   5. Practice name and name of physician performing surgery? [facility]   6. What is your office phone number [phone]   7.   What is your office fax number [phone]  8.   Anesthesia type (None, local, MAC, general) ?  Monitored

## 2017-08-15 NOTE — Telephone Encounter (Signed)
Routing to Dr Arida for review. 

## 2017-08-18 ENCOUNTER — Encounter: Payer: Self-pay | Admitting: Physician Assistant

## 2017-08-18 NOTE — Progress Notes (Signed)
Cardiology Office Note Date:  08/22/2017  Patient ID:  Ann Collins, DOB 1945-03-08, MRN [id number] PCP:  Einar Pheasant, MD  Cardiologist:  Dr. Fletcher Anon, MD   Chief Complaint: Follow up diagnostic cath  History of Present Illness: Ann Collins is a 72 y.o. female with history of CAD medically managed as below, DM, HTN, HLD with prior refusal to take statins due to memory concerns, ongoing tobacco abuse at 1 pack daily for ~ 50 years, and esophageal erosions with dysphagia who presents for LHC follow up.   Patient was seen by her PCP on 6/3 with worsening abdominal pain, burning sensation in her chest, and bloating that was not relieved by antacids. These symptoms were similar to when she had problems with her gallbladder; however, she underwent a cholecystectomy in 2000. EKG at the PCP's office showed anterior st/t changes. Patient was sent to the ED with 2 sets of cardiac enzymes being negative. She was evaluated by Dr. Fletcher Anon on 07/21/2017 for follow up and reported gradual improvement in her symptoms without intervention, though did note intermittent jaw pain with exertion. She underwent treadmill Myoview on 07/26/17 that showed a moderate sized region of ischemia of moderate intensity in the mid to distal anterior and anterolateral wall as well as the apical region, EF 61%, nonspecific anterior st/t changes noted on resting EKG with significant artifact during exercise making interpretation difficult. During recovery, she was noted to have diffuse nonspecific st/t changes of less than 1 mm persisting into 6 minutes into recovery. She achieved 10.1 METs and was noted to be hypertensive with exercise with a peak BP of 205/82. This was overall a moderate to high risk scan. Because of this, she underwent LHC on 08/07/2017 that showed a left dominant system with severe two-vessel CAD. Chronically occluded proximal LAD with well-developed collaterals from the RCA. There was also 90% stenosis of the OM2  which was noted to be a 2 mm vessel. Low normal LVSF with an EF of 50-55% with anterior and apical hypokinesis. Mildly elevated LVEDP. Given the LAD was CTO with well-developed collaterals and the OM was too small to stent aggressive medical management was advised along with risk factor modification. She was started on ASA, Lipitor, and Coreg.   Labs: 03/2017: LDL 136, A1c 7.7, LFT normal 07/2017: K+ 4.6, SCr 1.00, HGB 13.4  She comes in doing well from a cardiac perspective today. She has not had any further chest pain or SOB. She continues to note reflux and is scheduled for an EGD/colonoscopy in mid August, 2019. She has noted significant left leg cramping since starting Lipitor. She reports this previously happened to her when she tried to take Lipitor in the past. She has previously taken Crestor without issues. No symptoms concerning for claudication. She has cut back on her cigarettes to ~ 1/2 a pack daily. She has enrolled in a support group through [facility] for smoking cessation. BP at home has been running in the 782N to 562Z systolic. Compliant with all medications. She feels like she is doing well. She is able to achieve > 4.0 METs without difficulty. No dizziness, presyncope, or syncope. No recent falls, BRBPR, or melena.   Past Medical History:  Diagnosis Date  . CAD (coronary artery disease)    a. LHC 7/19: LM nl, p-mLAD 100% w/ right to left collaterals, OM2 90% (2 mm), 1st LPL 40%, medical management  . Diabetes mellitus ([place])   . Diverticulosis   . GERD (gastroesophageal reflux disease)   .  Hematuria   . Hypercholesterolemia   . Hypertension   . Hypothyroidism   . Kidney stones     Past Surgical History:  Procedure Laterality Date  . APPENDECTOMY    . BREAST BIOPSY  1969   right  . CHOLECYSTECTOMY  2005  . LEFT HEART CATH AND CORONARY ANGIOGRAPHY Left 08/07/2017   Procedure: LEFT HEART CATH AND CORONARY ANGIOGRAPHY;  Surgeon: Wellington Hampshire, MD;  Location: [facility];  Service: Cardiovascular;  Laterality: Left;    Current Meds  Medication Sig  . aspirin 81 MG tablet Take 81 mg by mouth daily.  . carvedilol (COREG) 3.125 MG tablet Take 1 tablet (3.125 mg total) by mouth 2 (two) times daily.  . Cyanocobalamin 1500 MCG TBDP Take 1,500 mcg by mouth daily.  Marland Kitchen glipiZIDE (GLUCOTROL XL) 2.5 MG 24 hr tablet take 1 tablet by mouth once daily before BREAKFAST  . glucose blood test strip Use as instructed to blood sugar twice daily  . levothyroxine (SYNTHROID, LEVOTHROID) 100 MCG tablet take 1 tablet by mouth once daily  . [DISCONTINUED] atorvastatin (LIPITOR) 40 MG tablet Take 1 tablet (40 mg total) by mouth daily at 6 PM.  . [DISCONTINUED] lisinopril (PRINIVIL,ZESTRIL) 10 MG tablet Take 1 tablet (10 mg total) by mouth daily.   Current Facility-Administered Medications for the 08/22/17 encounter (Office Visit) with Rise Mu, PA-C  Medication  . aspirin chewable tablet 81 mg    Allergies:   Pioglitazone; Macrobid [nitrofurantoin macrocrystal]; and Nitrofurantoin   Social History:  The patient  reports that she has been smoking.  She has a 50.00 pack-year smoking history. She has never used smokeless tobacco. She reports that she does not drink alcohol or use drugs.   Family History:  The patient's family history includes Alcohol abuse in her son; Bipolar disorder in her son; Brain cancer in her mother; Diabetes in her brother; Lung disease in her father.  ROS:   Review of Systems  Constitutional: Negative for chills, diaphoresis, fever, malaise/fatigue and weight loss.  HENT: Negative for congestion.   Eyes: Negative for discharge and redness.  Respiratory: Negative for cough, hemoptysis, sputum production, shortness of breath and wheezing.   Cardiovascular: Negative for chest pain, palpitations, orthopnea, claudication, leg swelling and PND.  Gastrointestinal: Positive for heartburn. Negative for abdominal pain, blood in stool, melena, nausea and  vomiting.  Genitourinary: Negative for hematuria.  Musculoskeletal: Positive for myalgias. Negative for falls.  Skin: Negative for rash.  Neurological: Negative for dizziness, tingling, tremors, sensory change, speech change, focal weakness, loss of consciousness and weakness.  Endo/Heme/Allergies: Does not bruise/bleed easily.  Psychiatric/Behavioral: Negative for substance abuse. The patient is not nervous/anxious.   All other systems reviewed and are negative.    PHYSICAL EXAM:  VS:  BP 140/60 (BP Location: Left Arm, Patient Position: Sitting, Cuff Size: Normal)   Pulse (!) 53   Ht 5\' 2"  (1.575 m)   Wt 150 lb 4 oz (68.2 kg)   LMP 03/10/1991   BMI 27.48 kg/m  BMI: Body mass index is 27.48 kg/m.  Physical Exam  Constitutional: She is oriented to person, place, and time. She appears well-developed and well-nourished.  HENT:  Head: Normocephalic and atraumatic.  Eyes: Right eye exhibits no discharge. Left eye exhibits no discharge.  Neck: Normal range of motion. No JVD present.  Cardiovascular: Regular rhythm, S1 normal, S2 normal and normal heart sounds. Bradycardia present. Exam reveals no distant heart sounds, no friction rub, no midsystolic click and  no opening snap.  No murmur heard. Pulses:      Posterior tibial pulses are 2+ on the right side, and 2+ on the left side.  Right radial cath site is well healing without bleeding, bruising, swelling, erythema, warmth, or TTP. Radial pulse 2+.   Pulmonary/Chest: Effort normal and breath sounds normal. No respiratory distress. She has no decreased breath sounds. She has no wheezes. She has no rales. She exhibits no tenderness.  Abdominal: Soft. She exhibits no distension. There is no tenderness.  Musculoskeletal: She exhibits no edema.  Neurological: She is alert and oriented to person, place, and time.  Skin: Skin is warm and dry. No cyanosis. Nails show no clubbing.  Psychiatric: She has a normal mood and affect. Her speech is  normal and behavior is normal. Judgment and thought content normal.    EKG:  Was ordered and interpreted by me today. Shows sinus bradycardia, 53 bpm, nonspecific anterolateral st/t changes (noted on prior study)  Recent Labs: 12/06/2016: TSH 1.26 07/10/2017: ALT 14 08/03/2017: BUN 17; Creatinine, Ser 1.00; Hemoglobin 13.4; Platelets 153; Potassium 4.6; Sodium 138  08/29/2016: Direct LDL 156.0 03/13/2017: Cholesterol 215; HDL 49.80; LDL Cholesterol 136; Total CHOL/HDL Ratio 4; Triglycerides 144.0; VLDL 28.8   Estimated Creatinine Clearance: 46.7 mL/min (by C-G formula based on SCr of 1 mg/dL).   Wt Readings from Last 3 Encounters:  08/22/17 150 lb 4 oz (68.2 kg)  08/07/17 148 lb (67.1 kg)  07/21/17 148 lb 8 oz (67.4 kg)     Other studies reviewed: Additional studies/records reviewed today include: summarized above  ASSESSMENT AND PLAN:  1. Pre-procedure cardiac evaluation: Scheduled to undergo EGD/colonscopy in mid 09/2017. Recent LHC as above with recommendation for medical management. She is doing well and has no symptoms of active angina. She is able to achieve > 4.0 METs without difficulty. Overall, from a cardiac perspective, patient would be moderate risk for non-cardiac procedure. Pre-procedure risk stratification form mentioned need for holding Coumadin. The patient is not on Coumadin. If possible, would prefer she remain on ASA.   2. CAD of the native coronary arteries without angina: Doing well. Continue medical management with ASA, lisinopril, and Coreg. Lipitor has been changed to Crestor 5 mg daily as below given possible myalgias. Aggressive risk factor modification, including smoking cessation. Post-cath instructions. No plans for further ischemic evaluation at this time.    3. HTN: Blood pressure is mildly elevated today in the 127N systolic. Home BP has been running in the 170Y to 174B systolic. Increase lisinopril to 20 mg daily. Continue Coreg 3.125 mg bid (asymptomatic  bradycardia precludes further titration at this time).   4. HLD: LDL from 03/2017 noted to be 136 (not on a statin at that time). Recently started on Lipitor, though this has lead to some myalgias (prior myalgias with Lipitor). Stop Lipitor and start Crestor 5 mg daily in about 1 week if myalgias have resolved. Recheck FLP/LFT in 8 weeks.   5. Tobacco abuse: Complete cessation is advised.   6. DM: Per PCP.   Disposition: F/u with Dr. Fletcher Anon or an APP in 6 months.   Current medicines are reviewed at length with the patient today.  The patient did not have any concerns regarding medicines.  Signed, Christell Faith, PA-C 08/22/2017 1:46 PM     [address], [place] [id number] [phone]

## 2017-08-18 NOTE — Telephone Encounter (Signed)
She should be considered at moderate risk given recent cath findings.  She has not followed up after her cardiac cath though we need to make sure she is stable when she comes to see Thurmond Butts on the 16th.

## 2017-08-21 NOTE — Telephone Encounter (Signed)
Routing to [place] as he sees patient tomorrow 08/22/17.

## 2017-08-22 ENCOUNTER — Encounter: Payer: Self-pay | Admitting: Physician Assistant

## 2017-08-22 ENCOUNTER — Ambulatory Visit (INDEPENDENT_AMBULATORY_CARE_PROVIDER_SITE_OTHER): Payer: Medicare Other | Admitting: Physician Assistant

## 2017-08-22 VITALS — BP 140/60 | HR 53 | Ht 62.0 in | Wt 150.2 lb

## 2017-08-22 DIAGNOSIS — E7800 Pure hypercholesterolemia, unspecified: Secondary | ICD-10-CM | POA: Diagnosis not present

## 2017-08-22 DIAGNOSIS — Z01810 Encounter for preprocedural cardiovascular examination: Secondary | ICD-10-CM

## 2017-08-22 DIAGNOSIS — I2510 Atherosclerotic heart disease of native coronary artery without angina pectoris: Secondary | ICD-10-CM | POA: Diagnosis not present

## 2017-08-22 DIAGNOSIS — M791 Myalgia, unspecified site: Secondary | ICD-10-CM

## 2017-08-22 DIAGNOSIS — Z79899 Other long term (current) drug therapy: Secondary | ICD-10-CM | POA: Diagnosis not present

## 2017-08-22 DIAGNOSIS — I10 Essential (primary) hypertension: Secondary | ICD-10-CM | POA: Diagnosis not present

## 2017-08-22 DIAGNOSIS — Z720 Tobacco use: Secondary | ICD-10-CM | POA: Diagnosis not present

## 2017-08-22 MED ORDER — ROSUVASTATIN CALCIUM 5 MG PO TABS: 5.0000 mg | ORAL_TABLET | Freq: Every day | ORAL | 3 refills | Status: DC

## 2017-08-22 MED ORDER — LISINOPRIL 20 MG PO TABS: 20.0000 mg | ORAL_TABLET | Freq: Every day | ORAL | 3 refills | Status: DC

## 2017-08-22 NOTE — Patient Instructions (Addendum)
Medication Instructions:  Your physician has recommended you make the following change in your medication:  1- STOP Lipitor. 2- START Crestor 5 mg by mouth once a day. Start in about 1 week if your leg pain is better. 3- INCREASE Lisinopril to 20 mg by mouth once a day.   Labwork: Your physician recommends that you return for lab work in: [place] (BMET, Mg),  Your physician recommends that you return for lab work in: [place].  Please go to the [facility]. You will check in at the front desk to the right as you walk into the atrium. Valet Parking is offered if needed. - You will need to be FASTING. - On or around September 10th.     Testing/Procedures: none  Follow-Up: Your physician recommends that you schedule a follow-up appointment in: [place]. Make sure your lab work is done first.    If you need a refill on your cardiac medications before your next appointment, please call your pharmacy.

## 2017-08-23 LAB — BASIC METABOLIC PANEL
BUN / CREAT RATIO: 10 — AB (ref 12–28)
BUN: 11 mg/dL (ref 8–27)
CO2: 23 mmol/L (ref 20–29)
Calcium: 9.3 mg/dL (ref 8.7–10.3)
Chloride: 99 mmol/L (ref 96–106)
Creatinine, Ser: 1.05 mg/dL — ABNORMAL HIGH (ref 0.57–1.00)
GFR, EST AFRICAN AMERICAN: 62 mL/min/{1.73_m2} (ref >59)
GFR, EST NON AFRICAN AMERICAN: 54 mL/min/{1.73_m2} — AB (ref >59)
Glucose: 286 mg/dL — ABNORMAL HIGH (ref 65–99)
Potassium: 5.1 mmol/L (ref 3.5–5.2)
SODIUM: 139 mmol/L (ref 134–144)

## 2017-08-23 LAB — MAGNESIUM: Magnesium: 1.9 mg/dL (ref 1.6–2.3)

## 2017-09-19 ENCOUNTER — Ambulatory Visit: Payer: Medicare Other | Admitting: Anesthesiology

## 2017-09-19 ENCOUNTER — Encounter
Admission: RE | Disposition: A | Discharge status: Home / Self Care | Payer: Self-pay | Source: Ambulatory Visit | Attending: Internal Medicine

## 2017-09-19 ENCOUNTER — Encounter: Payer: Self-pay | Admitting: Anesthesiology

## 2017-09-19 ENCOUNTER — Ambulatory Visit
Admission: RE | Admit: 2017-09-19 | Discharge: 2017-09-19 | Disposition: A | Discharge status: Home / Self Care | Payer: Medicare Other | Source: Ambulatory Visit | Attending: Internal Medicine | Admitting: Internal Medicine

## 2017-09-19 DIAGNOSIS — I2510 Atherosclerotic heart disease of native coronary artery without angina pectoris: Secondary | ICD-10-CM | POA: Insufficient documentation

## 2017-09-19 DIAGNOSIS — F17210 Nicotine dependence, cigarettes, uncomplicated: Secondary | ICD-10-CM | POA: Insufficient documentation

## 2017-09-19 DIAGNOSIS — E119 Type 2 diabetes mellitus without complications: Secondary | ICD-10-CM | POA: Diagnosis not present

## 2017-09-19 DIAGNOSIS — Z86010 Personal history of colonic polyps: Secondary | ICD-10-CM | POA: Diagnosis not present

## 2017-09-19 DIAGNOSIS — I10 Essential (primary) hypertension: Secondary | ICD-10-CM | POA: Insufficient documentation

## 2017-09-19 DIAGNOSIS — D125 Benign neoplasm of sigmoid colon: Secondary | ICD-10-CM | POA: Diagnosis not present

## 2017-09-19 DIAGNOSIS — R195 Other fecal abnormalities: Secondary | ICD-10-CM | POA: Insufficient documentation

## 2017-09-19 DIAGNOSIS — E1151 Type 2 diabetes mellitus with diabetic peripheral angiopathy without gangrene: Secondary | ICD-10-CM | POA: Diagnosis not present

## 2017-09-19 DIAGNOSIS — K3189 Other diseases of stomach and duodenum: Secondary | ICD-10-CM | POA: Insufficient documentation

## 2017-09-19 DIAGNOSIS — K229 Disease of esophagus, unspecified: Secondary | ICD-10-CM | POA: Diagnosis not present

## 2017-09-19 DIAGNOSIS — K2950 Unspecified chronic gastritis without bleeding: Secondary | ICD-10-CM | POA: Diagnosis not present

## 2017-09-19 DIAGNOSIS — K635 Polyp of colon: Secondary | ICD-10-CM | POA: Diagnosis not present

## 2017-09-19 DIAGNOSIS — E039 Hypothyroidism, unspecified: Secondary | ICD-10-CM | POA: Diagnosis not present

## 2017-09-19 DIAGNOSIS — Z7982 Long term (current) use of aspirin: Secondary | ICD-10-CM | POA: Diagnosis not present

## 2017-09-19 DIAGNOSIS — K219 Gastro-esophageal reflux disease without esophagitis: Secondary | ICD-10-CM | POA: Diagnosis not present

## 2017-09-19 DIAGNOSIS — E7800 Pure hypercholesterolemia, unspecified: Secondary | ICD-10-CM | POA: Insufficient documentation

## 2017-09-19 DIAGNOSIS — K640 First degree hemorrhoids: Secondary | ICD-10-CM | POA: Diagnosis not present

## 2017-09-19 DIAGNOSIS — K644 Residual hemorrhoidal skin tags: Secondary | ICD-10-CM | POA: Diagnosis not present

## 2017-09-19 DIAGNOSIS — K222 Esophageal obstruction: Secondary | ICD-10-CM | POA: Insufficient documentation

## 2017-09-19 DIAGNOSIS — Z7984 Long term (current) use of oral hypoglycemic drugs: Secondary | ICD-10-CM | POA: Insufficient documentation

## 2017-09-19 DIAGNOSIS — K2970 Gastritis, unspecified, without bleeding: Secondary | ICD-10-CM | POA: Diagnosis not present

## 2017-09-19 DIAGNOSIS — R1013 Epigastric pain: Secondary | ICD-10-CM | POA: Diagnosis not present

## 2017-09-19 HISTORY — DX: Unspecified abdominal hernia without obstruction or gangrene: K46.9

## 2017-09-19 HISTORY — PX: COLONOSCOPY WITH PROPOFOL: SHX5780

## 2017-09-19 HISTORY — PX: ESOPHAGOGASTRODUODENOSCOPY (EGD) WITH PROPOFOL: SHX5813

## 2017-09-19 LAB — HM COLONOSCOPY

## 2017-09-19 LAB — GLUCOSE, CAPILLARY: Glucose-Capillary: 137 mg/dL — ABNORMAL HIGH (ref 70–99)

## 2017-09-19 SURGERY — ESOPHAGOGASTRODUODENOSCOPY (EGD) WITH PROPOFOL
Anesthesia: General

## 2017-09-19 MED ORDER — PROPOFOL 10 MG/ML IV BOLUS
INTRAVENOUS | Status: DC | PRN
  Administered 2017-09-19: 60 mg via INTRAVENOUS

## 2017-09-19 MED ORDER — GLYCOPYRROLATE 0.2 MG/ML IJ SOLN
INTRAMUSCULAR | Status: DC | PRN
  Administered 2017-09-19: 0.2 mg via INTRAVENOUS

## 2017-09-19 MED ORDER — SODIUM CHLORIDE 0.9 % IV SOLN
INTRAVENOUS | Status: DC
  Administered 2017-09-19: 1000 mL via INTRAVENOUS

## 2017-09-19 MED ORDER — LIDOCAINE HCL (PF) 1 % IJ SOLN
INTRAMUSCULAR | Status: AC
  Administered 2017-09-19: 0.3 mL
  Filled 2017-09-19: qty 2

## 2017-09-19 MED ORDER — LIDOCAINE HCL (CARDIAC) PF 100 MG/5ML IV SOSY
PREFILLED_SYRINGE | INTRAVENOUS | Status: DC | PRN
  Administered 2017-09-19: 40 mg via INTRAVENOUS

## 2017-09-19 MED ORDER — PROPOFOL 500 MG/50ML IV EMUL
INTRAVENOUS | Status: DC | PRN
  Administered 2017-09-19: 140 ug/kg/min via INTRAVENOUS

## 2017-09-19 NOTE — Transfer of Care (Signed)
Immediate Anesthesia Transfer of Care Note  Patient: Ann Collins  Procedure(s) Performed: ESOPHAGOGASTRODUODENOSCOPY (EGD) WITH PROPOFOL (N/A ) COLONOSCOPY WITH PROPOFOL (N/A )  Patient Location: PACU  Anesthesia Type:General  Level of Consciousness: sedated  Airway & Oxygen Therapy: Patient Spontanous Breathing and Patient connected to nasal cannula oxygen  Post-op Assessment: Report given to RN and Post -op Vital signs reviewed and stable  Post vital signs: Reviewed and stable  Last Vitals:  Vitals Value Taken Time  BP 105/56 09/19/2017  1:32 PM  Temp 36.1 C 09/19/2017  1:32 PM  Pulse 66 09/19/2017  1:32 PM  Resp 21 09/19/2017  1:32 PM  SpO2 98 % 09/19/2017  1:32 PM    Last Pain:  Vitals:   09/19/17 1331  TempSrc: Tympanic  PainSc: 0-No pain         Complications: No apparent anesthesia complications

## 2017-09-19 NOTE — Op Note (Signed)
[facility] Gastroenterology Patient Name: Ann Collins Procedure Date: 09/19/2017 1:03 PM MRN: [id number] Account #: [id number] Date of Birth: April 05, 1945 Admit Type: Outpatient Age: 72 Room: [facility] ENDO ROOM 2 Gender: Female Note Status: Finalized Procedure:            Colonoscopy Indications:          Positive Cologuard test Providers:            Benay Pike. [place] MD, MD Medicines:            Propofol per Anesthesia Complications:        No immediate complications. Procedure:            Pre-Anesthesia Assessment:                       - The risks and benefits of the procedure and the                        sedation options and risks were discussed with the                        patient. All questions were answered and informed                        consent was obtained.                       - Patient identification and proposed procedure were                        verified prior to the procedure by the nurse. The                        procedure was verified in the procedure room.                       - ASA Grade Assessment: III - A patient with severe                        systemic disease.                       - After reviewing the risks and benefits, the patient                        was deemed in satisfactory condition to undergo the                        procedure.                       After obtaining informed consent, the colonoscope was                        passed under direct vision. Throughout the procedure,                        the patient's blood pressure, pulse, and oxygen                        saturations were monitored continuously. The  Colonoscope was introduced through the anus and                        advanced to the the cecum, identified by appendiceal                        orifice and ileocecal valve. The colonoscopy was                        performed without difficulty. The patient tolerated the                       procedure well. The quality of the bowel preparation                        was excellent. The ileocecal valve, appendiceal                        orifice, and rectum were photographed. Findings:      The perianal exam findings include internal hemorrhoids (Grade I).      Two sessile polyps were found in the sigmoid colon. The polyps were 3 to       4 mm in size. These polyps were removed with a jumbo cold forceps.       Resection and retrieval were complete.      Non-bleeding internal hemorrhoids were found during retroflexion. The       hemorrhoids were Grade I (internal hemorrhoids that do not prolapse).      The exam was otherwise without abnormality. Impression:           - Internal hemorrhoids (Grade I) found on perianal exam.                       - Two 3 to 4 mm polyps in the sigmoid colon, removed                        with a jumbo cold forceps. Resected and retrieved.                       - Non-bleeding internal hemorrhoids.                       - The examination was otherwise normal. Recommendation:       - Patient has a contact number available for                        emergencies. The signs and symptoms of potential                        delayed complications were discussed with the patient.                        Return to normal activities tomorrow. Written discharge                        instructions were provided to the patient.                       - Resume previous diet.                       -  Continue present medications.                       - Await pathology results.                       - Repeat colonoscopy is recommended for surveillance.                        The colonoscopy date will be determined after pathology                        results from today's exam become available for review.                       - Return to GI office PRN.                       - Await pathology results from EGD, also performed                         today.                       - Return to GI office PRN.                       - The findings and recommendations were discussed with                        the patient and their family. Procedure Code(s):    --- Professional ---                       [phone], Colonoscopy, flexible; with biopsy, single or                        multiple Diagnosis Code(s):    --- Professional ---                       R19.5, Other fecal abnormalities                       K64.0, First degree hemorrhoids                       D12.5, Benign neoplasm of sigmoid colon CPT copyright 2017 American Medical Association. All rights reserved. The codes documented in this report are preliminary and upon coder review may  be revised to meet current compliance requirements. Efrain Sella MD, MD 09/19/2017 1:31:56 PM This report has been signed electronically. Number of Addenda: 0 Note Initiated On: 09/19/2017 1:03 PM Scope Withdrawal Time: 0 hours 6 minutes 26 seconds  Total Procedure Duration: 0 hours 11 minutes 35 seconds       [facility]

## 2017-09-19 NOTE — H&P (Signed)
Outpatient short stay form Pre-procedure 09/19/2017 12:34 PM Ann Collins K. Alice Reichert, M.D.  Primary Physician: Einar Pheasant, M.D.  Reason for visit:  Positive cologuard, Epigastric pain, personal hx of colon polyps  History of present illness:   As above. Epigastric pain resolved with PPI therapy taken PRN (Pantoprazole). Patient's physician, Dr. Nicki Reaper, tested the patient's stool and Cologuard test was resulted as positive.  Has hx of gastritis and paraesophageal hernia. 2010 EGD.    Current Facility-Administered Medications:  .  0.9 %  sodium chloride infusion, , Intravenous, Continuous, [place], Benay Pike, MD, Last Rate: 20 mL/hr at 09/19/17 1231, 1,000 mL at 09/19/17 1231  Facility-Administered Medications Prior to Admission  Medication Dose Route Frequency Provider Last Rate Last Dose  . aspirin chewable tablet 81 mg  81 mg Oral Once Wellington Hampshire, MD       Medications Prior to Admission  Medication Sig Dispense Refill Last Dose  . pantoprazole (PROTONIX) 40 MG tablet Take 40 mg by mouth 2 (two) times daily.     Marland Kitchen aspirin 81 MG tablet Take 81 mg by mouth daily.   09/17/2017  . carvedilol (COREG) 3.125 MG tablet Take 1 tablet (3.125 mg total) by mouth 2 (two) times daily. 60 tablet 3 Taking  . Cyanocobalamin 1500 MCG TBDP Take 1,500 mcg by mouth daily.   Taking  . glipiZIDE (GLUCOTROL XL) 2.5 MG 24 hr tablet take 1 tablet by mouth once daily before BREAKFAST 90 tablet 0 Taking  . glucose blood test strip Use as instructed to blood sugar twice daily 100 each 12 Taking  . levothyroxine (SYNTHROID, LEVOTHROID) 100 MCG tablet take 1 tablet by mouth once daily 90 tablet 1 Taking  . lisinopril (PRINIVIL,ZESTRIL) 20 MG tablet Take 1 tablet (20 mg total) by mouth daily. 90 tablet 3   . rosuvastatin (CRESTOR) 5 MG tablet Take 1 tablet (5 mg total) by mouth daily. 90 tablet 3      Allergies  Allergen Reactions  . Pioglitazone Swelling  . Macrobid [Nitrofurantoin Macrocrystal] Other (See  Comments)    Body aches  . Nitrofurantoin Other (See Comments)    Body aches     Past Medical History:  Diagnosis Date  . Abdominal hernia   . CAD (coronary artery disease)    a. LHC 7/19: LM nl, p-mLAD 100% w/ right to left collaterals, OM2 90% (2 mm), 1st LPL 40%, medical management  . Diabetes mellitus ([place])   . Diverticulosis   . GERD (gastroesophageal reflux disease)   . Hematuria   . Hypercholesterolemia   . Hypertension   . Hypothyroidism   . Kidney stones     Review of systems:  Otherwise negative.    Physical Exam  Gen: Alert, oriented. Appears stated age.  HEENT: [place]/AT. PERRLA. Lungs: CTA, no wheezes. CV: RR nl S1, S2. Abd: soft, benign, no masses. BS+ Ext: No edema. Pulses 2+    Planned procedures: Proceed with EGD and colonoscopy. The patient understands the nature of the planned procedure, indications, risks, alternatives and potential complications including but not limited to bleeding, infection, perforation, damage to internal organs and possible oversedation/side effects from anesthesia. The patient agrees and gives consent to proceed.  Please refer to procedure notes for findings, recommendations and patient disposition/instructions.     Delmi Fulfer K. Alice Reichert, M.D. Gastroenterology 09/19/2017  12:34 PM

## 2017-09-19 NOTE — Op Note (Signed)
[facility] Gastroenterology Patient Name: Ann Collins Procedure Date: 09/19/2017 1:03 PM MRN: [id number] Account #: [id number] Date of Birth: [birth year] Admit Type: Outpatient Age: 72 Room: [facility] ENDO ROOM 2 Gender: Female Note Status: Finalized Procedure:            Upper GI endoscopy Indications:          Epigastric abdominal pain Providers:            Benay Pike. Mickenzie Stolar MD, MD Medicines:            Propofol per Anesthesia Complications:        No immediate complications. Procedure:            Pre-Anesthesia Assessment:                       - The risks and benefits of the procedure and the                        sedation options and risks were discussed with the                        patient. All questions were answered and informed                        consent was obtained.                       - Patient identification and proposed procedure were                        verified prior to the procedure by the nurse. The                        procedure was verified in the procedure room.                       - ASA Grade Assessment: III - A patient with severe                        systemic disease.                       - After reviewing the risks and benefits, the patient                        was deemed in satisfactory condition to undergo the                        procedure.                       After obtaining informed consent, the endoscope was                        passed under direct vision. Throughout the procedure,                        the patient's blood pressure, pulse, and oxygen                        saturations were monitored continuously. The Endoscope  was introduced through the mouth, and advanced to the                        third part of duodenum. The upper GI endoscopy was                        accomplished without difficulty. The patient tolerated                        the procedure well. Findings:      A mild Schatzki ring was found in the distal esophagus.      Patchy mildly erythematous mucosa without bleeding was found in the       gastric antrum. Biopsies were taken with a cold forceps for Helicobacter       pylori testing.      The examined duodenum was normal.      The exam was otherwise without abnormality. Impression:           - Mild Schatzki ring.                       - Erythematous mucosa in the antrum. Biopsied.                       - Normal examined duodenum.                       - The examination was otherwise normal. Recommendation:       - Await pathology results.                       - Proceed with colonoscopy Procedure Code(s):    --- Professional ---                       [phone], Esophagogastroduodenoscopy, flexible, transoral;                        with biopsy, single or multiple Diagnosis Code(s):    --- Professional ---                       R10.13, Epigastric pain                       K31.89, Other diseases of stomach and duodenum                       K22.2, Esophageal obstruction CPT copyright 2017 American Medical Association. All rights reserved. The codes documented in this report are preliminary and upon coder review may  be revised to meet current compliance requirements. Efrain Sella MD, MD 09/19/2017 1:13:33 PM This report has been signed electronically. Number of Addenda: 0 Note Initiated On: 09/19/2017 1:03 PM      [facility]

## 2017-09-19 NOTE — Anesthesia Post-op Follow-up Note (Signed)
Anesthesia QCDR form completed.        

## 2017-09-19 NOTE — Anesthesia Preprocedure Evaluation (Signed)
Anesthesia Evaluation  Patient identified by MRN, date of birth, ID band Patient awake    Reviewed: Allergy & Precautions, H&P , NPO status , Patient's Chart, lab work & pertinent test results, reviewed documented beta blocker date and time   History of Anesthesia Complications Negative for: history of anesthetic complications  Airway Mallampati: II  TM Distance: >3 FB Neck ROM: full    Dental  (+) Dental Advidsory Given, Partial Upper, Missing, Poor Dentition   Pulmonary neg shortness of breath, neg sleep apnea, neg COPD, neg recent URI, Current Smoker,           Cardiovascular Exercise Tolerance: Good hypertension, (-) angina+ CAD, + Past MI and + Peripheral Vascular Disease  (-) Cardiac Stents and (-) CABG negative cardio ROS  (-) dysrhythmias (-) Valvular Problems/Murmurs     Neuro/Psych negative neurological ROS  negative psych ROS   GI/Hepatic Neg liver ROS, GERD  ,  Endo/Other  diabetes, Oral Hypoglycemic AgentsHypothyroidism   Renal/GU Renal disease (kidney stones)  negative genitourinary   Musculoskeletal   Abdominal   Peds  Hematology negative hematology ROS (+)   Anesthesia Other Findings Past Medical History: No date: Abdominal hernia No date: CAD (coronary artery disease)     Comment:  a. LHC 7/19: LM nl, p-mLAD 100% w/ right to left               collaterals, OM2 90% (2 mm), 1st LPL 40%, medical               management No date: Diabetes mellitus ([facility]) No date: Diverticulosis No date: GERD (gastroesophageal reflux disease) No date: Hematuria No date: Hypercholesterolemia No date: Hypertension No date: Hypothyroidism No date: Kidney stones   Reproductive/Obstetrics negative OB ROS                             Anesthesia Physical Anesthesia Plan  ASA: III  Anesthesia Plan: General   Post-op Pain Management:    Induction: Intravenous  PONV Risk Score and Plan:  2 and Propofol infusion and TIVA  Airway Management Planned: Nasal Cannula  Additional Equipment:   Intra-op Plan:   Post-operative Plan:   Informed Consent: I have reviewed the patients History and Physical, chart, labs and discussed the procedure including the risks, benefits and alternatives for the proposed anesthesia with the patient or authorized representative who has indicated his/her understanding and acceptance.   Dental Advisory Given  Plan Discussed with: Anesthesiologist, CRNA and Surgeon  Anesthesia Plan Comments:         Anesthesia Quick Evaluation

## 2017-09-19 NOTE — Interval H&P Note (Signed)
History and Physical Interval Note:  09/19/2017 12:37 PM  Ann Collins  has presented today for surgery, with the diagnosis of EPIG PAIN POSITIVE COLOGUARD TEST  The various methods of treatment have been discussed with the patient and family. After consideration of risks, benefits and other options for treatment, the patient has consented to  Procedure(s): ESOPHAGOGASTRODUODENOSCOPY (EGD) WITH PROPOFOL (N/A) COLONOSCOPY WITH PROPOFOL (N/A) as a surgical intervention .  The patient's history has been reviewed, patient examined, no change in status, stable for surgery.  I have reviewed the patient's chart and labs.  Questions were answered to the patient's satisfaction.     [place], [place]

## 2017-09-20 NOTE — Anesthesia Postprocedure Evaluation (Signed)
Anesthesia Post Note  Patient: Ann Collins  Procedure(s) Performed: ESOPHAGOGASTRODUODENOSCOPY (EGD) WITH PROPOFOL (N/A ) COLONOSCOPY WITH PROPOFOL (N/A )  Patient location during evaluation: Endoscopy Anesthesia Type: General Level of consciousness: awake and alert Pain management: pain level controlled Vital Signs Assessment: post-procedure vital signs reviewed and stable Respiratory status: spontaneous breathing, nonlabored ventilation, respiratory function stable and patient connected to nasal cannula oxygen Cardiovascular status: blood pressure returned to baseline and stable Postop Assessment: no apparent nausea or vomiting Anesthetic complications: no     Last Vitals:  Vitals:   09/19/17 1351 09/19/17 1401  BP: 135/88 (!) 173/73  Pulse: 76 (!) 58  Resp: 17 16  Temp:    SpO2: 100% 100%    Last Pain:  Vitals:   09/20/17 0724  TempSrc:   PainSc: 0-No pain                 Martha Clan

## 2017-09-21 LAB — SURGICAL PATHOLOGY

## 2017-09-24 ENCOUNTER — Other Ambulatory Visit: Payer: Self-pay | Admitting: Internal Medicine

## 2017-09-25 ENCOUNTER — Encounter: Payer: Self-pay | Admitting: Internal Medicine

## 2017-09-29 ENCOUNTER — Other Ambulatory Visit: Payer: Self-pay | Admitting: Internal Medicine

## 2017-10-17 NOTE — Progress Notes (Signed)
Cardiology Office Note Date:  10/18/2017  Patient ID:  Ann Collins, DOB 25-Sep-1945, MRN [id number] PCP:  Einar Pheasant, MD  Cardiologist:  Dr. Fletcher Anon, MD    Chief Complaint: Follow up  History of Present Illness: LATAUNYA RUUD is a 72 y.o. female with history of CAD medically managed as below, DM, HTN, HLD with prior refusal to take statins due to memory concerns now currently on Crestor, ongoing tobacco abuse at 1 pack daily for ~ 50 years, and esophageal erosions with dysphagia who presents for follow-up of CAD.  Patient was seen by her PCP on 07/10/17 with worsening abdominal pain, burning sensation in her chest, and bloating that was not relieved by antacids. These symptoms were similar to when she had problems with her gallbladder; however, she underwent a cholecystectomy in 2000. EKG at the PCP's office showed anterior st/t changes. Patient was sent to the ED with 2 sets of cardiac enzymes being negative. She was evaluated by Dr. Fletcher Anon on 07/21/2017 for follow up and reported gradual improvement in her symptoms without intervention, though did note intermittent jaw pain with exertion. She underwent treadmill Myoview on 07/26/17 that showed a moderate sized region of ischemia of moderate intensity in the mid to distal anterior and anterolateral wall as well as the apical region, EF 61%, nonspecific anterior st/t changes noted on resting EKG with significant artifact during exercise making interpretation difficult. During recovery, she was noted to have diffuse nonspecific st/t changes of less than 1 mm persisting into 6 minutes into recovery. She achieved 10.1 METs and was noted to be hypertensive with exercise with a peak BP of 205/82. This was overall a moderate to high risk scan. Because of this, she underwent LHC on 08/07/2017 that showed a left dominant system with severe two-vessel CAD. Chronically occluded proximal LAD with well-developed collaterals from the RCA. There was also 90%  stenosis of the OM2 which was noted to be a 2 mm vessel. Low normal LVSF with an EF of 50-55% with anterior and apical hypokinesis. Mildly elevated LVEDP. Given the LAD was CTO with well-developed collaterals and the OM was too small to stent aggressive medical management was advised.  She was most recently seen in the office on 08/22/2017 in preparation for an EGD/colonoscopy.  She was doing well from a cardiac perspective.  Her Lipitor was discontinued secondary to leg cramping and she was placed on Crestor as she had previously tolerated this without issues.  She continued to smoke cigarettes at approximately 1/2 pack/day.  She was excited that she had enrolled in a support group through [facility] for smoking cessation.  She is felt to be moderate risk for noncardiac procedure.  Blood pressure was mildly elevated in the 245 systolic.  Her losartan was increased to 20 mg daily.  She was continued on carvedilol 3.125 mg twice daily.  Asymptomatic bradycardia precluded escalation of her Coreg at that time.  She subsequently underwent EGD and colonoscopy on 09/19/2017 with biopsies being negative for dysplasia and malignancy.  EGD biopsies were mildly reactive for gastropathy.  She comes in doing well today from a cardiac perspective.  She has not had any symptoms concerning for angina or dyspnea.  No palpitations, dizziness, presyncope, or syncope.  She denies any falls since she was last evaluated.  She denies any BRBPR or melena.  She reports blood pressure is typically better controlled than it is at office appointments.  She does note she frequently forgets to take her second 10 mg  dose of lisinopril.  She was unaware she could take both of of her 10 mg pills at the same time.  Weight has remained stable.  She denies any orthopnea, early satiety, PND, or lower extremity swelling.  She never started Crestor.  She self restarted on atorvastatin 40 mg daily and has tolerated this well without any leg cramping.  She  indicates the cramping she had in her right leg resolved after being stung by several bees.  Past Medical History:  Diagnosis Date  . Abdominal hernia   . CAD (coronary artery disease)    a. LHC 7/19: LM nl, p-mLAD 100% w/ right to left collaterals, OM2 90% (2 mm), 1st LPL 40%, medical management  . Diabetes mellitus ([place])   . Diverticulosis   . GERD (gastroesophageal reflux disease)   . Hematuria   . Hypercholesterolemia   . Hypertension   . Hypothyroidism   . Kidney stones     Past Surgical History:  Procedure Laterality Date  . APPENDECTOMY    . BREAST BIOPSY  1969   right  . CHOLECYSTECTOMY  2005  . COLONOSCOPY    . COLONOSCOPY WITH PROPOFOL N/A 09/19/2017   Procedure: COLONOSCOPY WITH PROPOFOL;  Surgeon: Toledo, Benay Pike, MD;  Location: [facility] END[facility]COPY;  Service: Gastroenterology;  Laterality: N/A;  . ESOPHAGOGASTRODUODENOSCOPY (EGD) WITH PROPOFOL N/A 09/19/2017   Procedure: ESOPHAGOGASTRODUODENOSCOPY (EGD) WITH PROPOFOL;  Surgeon: Toledo, Benay Pike, MD;  Location: [facility]COPY;  Service: Gastroenterology;  Laterality: N/A;  . LEFT HEART CATH AND CORONARY ANGIOGRAPHY Left 08/07/2017   Procedure: LEFT HEART CATH AND CORONARY ANGIOGRAPHY;  Surgeon: Wellington Hampshire, MD;  Location: [facility];  Service: Cardiovascular;  Laterality: Left;  . UPPER GI ENDOSCOPY      Current Meds  Medication Sig  . aspirin 81 MG tablet Take 81 mg by mouth daily.  Marland Kitchen atorvastatin (LIPITOR) 40 MG tablet Take 40 mg by mouth daily.  . carvedilol (COREG) 3.125 MG tablet Take 1 tablet (3.125 mg total) by mouth 2 (two) times daily.  . Cyanocobalamin 1500 MCG TBDP Take 1,500 mcg by mouth daily.  Marland Kitchen glipiZIDE (GLUCOTROL XL) 2.5 MG 24 hr tablet TAKE 1 TABLET BY MOUTH ONCE DAILY BEFORE BREAKFAST  . glucose blood test strip Use as instructed to blood sugar twice daily  . levothyroxine (SYNTHROID, LEVOTHROID) 100 MCG tablet TAKE 1 TABLET BY MOUTH ONCE DAILY  . lisinopril (PRINIVIL,ZESTRIL) 20 MG  tablet Take 1 tablet (20 mg total) by mouth daily.  . pantoprazole (PROTONIX) 40 MG tablet Take 40 mg by mouth 2 (two) times daily.   Current Facility-Administered Medications for the 10/18/17 encounter (Office Visit) with Rise Mu, PA-C  Medication  . aspirin chewable tablet 81 mg    Allergies:   Pioglitazone; Macrobid [nitrofurantoin macrocrystal]; and Nitrofurantoin   Social History:  The patient  reports that she has been smoking. She has a 50.00 pack-year smoking history. She has never used smokeless tobacco. She reports that she does not drink alcohol or use drugs.   Family History:  The patient's family history includes Alcohol abuse in her son; Bipolar disorder in her son; Brain cancer in her mother; Diabetes in her brother; Lung disease in her father.  ROS:   Review of Systems  Constitutional: Positive for malaise/fatigue. Negative for chills, diaphoresis, fever and weight loss.  HENT: Negative for congestion.   Eyes: Negative for discharge and redness.  Respiratory: Negative for cough, hemoptysis, sputum production, shortness of breath and wheezing.  Cardiovascular: Negative for chest pain, palpitations, orthopnea, claudication, leg swelling and PND.  Gastrointestinal: Negative for abdominal pain, blood in stool, heartburn, melena, nausea and vomiting.  Genitourinary: Negative for hematuria.  Musculoskeletal: Negative for falls and myalgias.  Skin: Negative for rash.  Neurological: Positive for weakness. Negative for dizziness, tingling, tremors, sensory change, speech change, focal weakness and loss of consciousness.  Endo/Heme/Allergies: Does not bruise/bleed easily.  Psychiatric/Behavioral: Negative for substance abuse. The patient is not nervous/anxious.   All other systems reviewed and are negative.    PHYSICAL EXAM:  VS:  BP 140/60 (BP Location: Left Arm, Patient Position: Sitting, Cuff Size: Normal)   Pulse (!) 59   Ht 5\' 2"  (1.575 m)   Wt 145 lb 4 oz (65.9 kg)    LMP 03/10/1991   BMI 26.57 kg/m  BMI: Body mass index is 26.57 kg/m.  Physical Exam  Constitutional: She is oriented to person, place, and time. She appears well-developed and well-nourished.  HENT:  Head: Normocephalic and atraumatic.  Eyes: Right eye exhibits no discharge. Left eye exhibits no discharge.  Neck: Normal range of motion. No JVD present.  Cardiovascular: Normal rate, regular rhythm, S1 normal, S2 normal and normal heart sounds. Exam reveals no distant heart sounds, no friction rub, no midsystolic click and no opening snap.  No murmur heard. Pulses:      Dorsalis pedis pulses are 2+ on the right side, and 2+ on the left side.       Posterior tibial pulses are 2+ on the right side, and 2+ on the left side.  Pulmonary/Chest: Effort normal and breath sounds normal. No respiratory distress. She has no decreased breath sounds. She has no wheezes. She has no rales. She exhibits no tenderness.  Abdominal: Soft. She exhibits no distension. There is no tenderness.  Musculoskeletal: She exhibits no edema.  Neurological: She is alert and oriented to person, place, and time.  Skin: Skin is warm and dry. No cyanosis. Nails show no clubbing.  Psychiatric: She has a normal mood and affect. Her speech is normal and behavior is normal. Judgment and thought content normal.     EKG:  Was ordered and interpreted by me today. Shows sinus bradycardia, 59 bpm, TWI leads I, aVL, V2, V3 (improved from prior)  Recent Labs: 12/06/2016: TSH 1.26 07/10/2017: ALT 14 08/03/2017: Hemoglobin 13.4; Platelets 153 08/22/2017: BUN 11; Creatinine, Ser 1.05; Magnesium 1.9; Potassium 5.1; Sodium 139  03/13/2017: Cholesterol 215; HDL 49.80; LDL Cholesterol 136; Total CHOL/HDL Ratio 4; Triglycerides 144.0; VLDL 28.8   CrCl cannot be calculated (Patient's most recent lab result is older than the maximum 21 days allowed.).   Wt Readings from Last 3 Encounters:  10/18/17 145 lb 4 oz (65.9 kg)  09/19/17 148 lb  (67.1 kg)  08/22/17 150 lb 4 oz (68.2 kg)     Other studies reviewed: Additional studies/records reviewed today include: summarized above  ASSESSMENT AND PLAN:  1. CAD involving the native coronary arteries without angina: She continues to do well without any symptoms concerning for ischemia.  Continue current medical management including aspirin 81 mg daily, carvedilol 3.125 mg twice daily, lisinopril 20 mg daily, and Lipitor 40 mg daily.  Aggressive risk factor modification including complete smoking cessation.  No plans for further ischemic evaluation at this time.  2. Hypertension: Blood pressure is mildly elevated at 140/60.  She indicates that she frequently forgets to take her second dose of lisinopril 10 mg.  I advised her today that she should take  both 10 mg tabs at the same time in the morning to minimize forgetting to take a dose.  She is only taking lisinopril this way because she has quite a lot of 10 mg tabs at home and was recently increased to 20 mg daily.  She is doing this in an effort to try and save money.  She does note a mild cough with her lisinopril though refuses to let me change her from lisinopril to an ARB at this time.  Continue carvedilol 3.125 mg twice daily.  Her asymptomatic bradycardia precludes further titration of beta-blocker at this time.  3. Hyperlipidemia: LDL from 03/2017 noted to be 136 (not on a statin at that time).  Has previously noted that she is intolerant to Lipitor and was recently advised to change her statin to Crestor at her last office visit.  However, patient did not make this change and self resumed Lipitor 40 mg daily and has been tolerating this without issues.  We will check a lipid, direct LDL, and liver function today.  Goal LDL less than 70.  4. Tobacco abuse: Complete cessation is advised.  She declines Chantix, nicotine gum, or nicotine patches at this time.  She indicates that things are "stressful" at home.  5. Diabetes: Per  PCP.  6. Leg cramps: Resolved following bee stings.  Disposition: F/u with Dr. Fletcher Anon or an APP in 6 months.  Current medicines are reviewed at length with the patient today.  The patient did not have any concerns regarding medicines.  Melvern Banker PA-C 10/18/2017 2:21 PM     [address] [place], [place] [address] [phone]

## 2017-10-18 ENCOUNTER — Ambulatory Visit (INDEPENDENT_AMBULATORY_CARE_PROVIDER_SITE_OTHER): Payer: Medicare Other | Admitting: Physician Assistant

## 2017-10-18 ENCOUNTER — Encounter: Payer: Self-pay | Admitting: Physician Assistant

## 2017-10-18 VITALS — BP 140/60 | HR 59 | Ht 62.0 in | Wt 145.2 lb

## 2017-10-18 DIAGNOSIS — I10 Essential (primary) hypertension: Secondary | ICD-10-CM

## 2017-10-18 DIAGNOSIS — Z720 Tobacco use: Secondary | ICD-10-CM | POA: Diagnosis not present

## 2017-10-18 DIAGNOSIS — E7800 Pure hypercholesterolemia, unspecified: Secondary | ICD-10-CM

## 2017-10-18 DIAGNOSIS — I2510 Atherosclerotic heart disease of native coronary artery without angina pectoris: Secondary | ICD-10-CM | POA: Diagnosis not present

## 2017-10-18 MED ORDER — ATORVASTATIN CALCIUM 40 MG PO TABS: 40.0000 mg | ORAL_TABLET | Freq: Every day | ORAL | 3 refills | Status: DC

## 2017-10-18 MED ORDER — CARVEDILOL 3.125 MG PO TABS: 3.1250 mg | ORAL_TABLET | Freq: Two times a day (BID) | ORAL | 3 refills | Status: DC

## 2017-10-18 NOTE — Patient Instructions (Addendum)
Medication Instructions: - Your physician recommends that you continue on your current medications as directed. Please refer to the Current Medication list given to you today.  Coreg (carvedilol) & lipitor (atorvastatin) have been refilled today  Labwork: - Your physician recommends that you have lab work today: lipid/ liver/ direct LDL  Procedures/Testing: - none ordered  Follow-Up: - Your physician wants you to follow-up in: 6 months with Dr. Okey Regal, PA. You will receive a reminder letter in the mail/ call two months in advance. If you don't receive a letter/ call, please call our office to schedule the follow-up appointment.   Any Additional Special Instructions Will Be Listed Below (If Applicable).     If you need a refill on your cardiac medications before your next appointment, please call your pharmacy.

## 2017-10-19 LAB — HEPATIC FUNCTION PANEL
ALK PHOS: 104 IU/L (ref 39–117)
ALT: 15 IU/L (ref 0–32)
AST: 14 IU/L (ref 0–40)
Albumin: 3.9 g/dL (ref 3.5–4.8)
Bilirubin Total: 0.3 mg/dL (ref 0.0–1.2)
Bilirubin, Direct: 0.12 mg/dL (ref 0.00–0.40)
Total Protein: 6.3 g/dL (ref 6.0–8.5)

## 2017-10-19 LAB — LIPID PANEL
CHOL/HDL RATIO: 3.3 ratio (ref 0.0–4.4)
CHOLESTEROL TOTAL: 134 mg/dL (ref 100–199)
HDL: 41 mg/dL (ref >39)
LDL CALC: 63 mg/dL (ref 0–99)
TRIGLYCERIDES: 150 mg/dL — AB (ref 0–149)
VLDL CHOLESTEROL CAL: 30 mg/dL (ref 5–40)

## 2017-10-19 LAB — LDL CHOLESTEROL, DIRECT: LDL DIRECT: 71 mg/dL (ref 0–99)

## 2017-10-19 NOTE — Addendum Note (Signed)
Addended by: Britt Bottom on: 10/19/2017 08:29 AM   Modules accepted: Orders

## 2017-10-24 ENCOUNTER — Telehealth: Payer: Self-pay | Admitting: *Deleted

## 2017-10-24 NOTE — Telephone Encounter (Signed)
-----   Message from Rise Mu, PA-C sent at 10/21/2017  7:27 AM EDT ----- LDL at goal. Liver function normal. Would continue Lipitor 40 mg daily.

## 2017-10-24 NOTE — Telephone Encounter (Signed)
No answer at either number listed. Left detail message with results, ok per DPR, and to call back if any questions.

## 2017-11-05 DIAGNOSIS — Z23 Encounter for immunization: Secondary | ICD-10-CM | POA: Diagnosis not present

## 2017-12-15 ENCOUNTER — Other Ambulatory Visit: Payer: Self-pay | Admitting: Internal Medicine

## 2018-03-03 ENCOUNTER — Other Ambulatory Visit: Payer: Self-pay | Admitting: Internal Medicine

## 2018-04-18 NOTE — Progress Notes (Signed)
Cardiology Office Note   Date:  04/19/2018   ID:  [name], DOB 1946-01-05, MRN [id number]  PCP:  Einar Pheasant, MD  Cardiologist:   Kathlyn Sacramento, MD   Chief Complaint  Patient presents with   other    6 month follow up. Meds reviewed by the pt. verbally. "doing well."       History of Present Illness: Ann Collins is a 73 y.o. female who is here today for follow-up visit regarding coronary artery disease.  She has chronic medical conditions that include diabetes mellitus, hyperlipidemia,  tobacco use and hypertension. She has known history of esophageal erosions and dysphagia.   She was seen last year for chest pain with EKG changes.  Treadmill Myoview in June 2019 showed moderate ischemia in the anterior, anterolateral and apical region.  Ejection fraction was 61%. Cardiac catheterization in July 2019 showed left dominant system with severe two-vessel coronary artery disease.  Chronically occluded proximal LAD with well-developed collaterals from the right coronary artery.  There was also 90% stenosis in OM 2 which was a 2 mm vessel. Low normal LV systolic function with an EF of 50 to 55% with anterior and apical hypokinesis.  Mildly elevated left ventricular end-diastolic pressure.  She was treated medically with no revascularization.  The patient is doing well today. She is tolerating the atorvastatin well and only had muscle aches at the beginning of treatment. Atorvastatin has improved her cholesterol to normal range. She has been out of lisinopril for the past month. States she has been feeling sleepy a lot recently. She currently goes to the gym 3 days a week, mostly walks on the treadmill for 30 minutes. She has been having some tenderness in her left breast. Continues to smoke 1 PPD, attempted to go to a free [facility] smoking cessation clinic, but did not qualify for it. States that stress causes her to continue smoking, but she does want to quit. She does not want to  try using medications to help quit smoking. She denies chest pain, SOB, palpitations or any other related symptoms or complaints at this time.    Past Medical History:  Diagnosis Date   Abdominal hernia    CAD (coronary artery disease)    a. LHC 7/19: LM nl, p-mLAD 100% w/ right to left collaterals, OM2 90% (2 mm), 1st LPL 40%, medical management   Diabetes mellitus ([place])    Diverticulosis    GERD (gastroesophageal reflux disease)    Hematuria    Hypercholesterolemia    Hypertension    Hypothyroidism    Kidney stones     Past Surgical History:  Procedure Laterality Date   APPENDECTOMY     BREAST BIOPSY  1969   right   CHOLECYSTECTOMY  2005   COLONOSCOPY     COLONOSCOPY WITH PROPOFOL N/A 09/19/2017   Procedure: COLONOSCOPY WITH PROPOFOL;  Surgeon: Toledo, Benay Pike, MD;  Location: [facility];  Service: Gastroenterology;  Laterality: N/A;   ESOPHAGOGASTRODUODENOSCOPY (EGD) WITH PROPOFOL N/A 09/19/2017   Procedure: ESOPHAGOGASTRODUODENOSCOPY (EGD) WITH PROPOFOL;  Surgeon: Toledo, Benay Pike, MD;  Location: [facility];  Service: Gastroenterology;  Laterality: N/A;   LEFT HEART CATH AND CORONARY ANGIOGRAPHY Left 08/07/2017   Procedure: LEFT HEART CATH AND CORONARY ANGIOGRAPHY;  Surgeon: Wellington Hampshire, MD;  Location: [facility];  Service: Cardiovascular;  Laterality: Left;   UPPER GI ENDOSCOPY       Current Outpatient Medications  Medication Sig Dispense Refill   aspirin  81 MG tablet Take 81 mg by mouth daily.     atorvastatin (LIPITOR) 40 MG tablet Take 1 tablet (40 mg total) by mouth daily. 90 tablet 3   carvedilol (COREG) 3.125 MG tablet Take 1 tablet (3.125 mg total) by mouth 2 (two) times daily. 180 tablet 3   Cyanocobalamin 1500 MCG TBDP Take 1,500 mcg by mouth daily.     glipiZIDE (GLUCOTROL XL) 2.5 MG 24 hr tablet TAKE 1 TABLET BY MOUTH ONCE DAILY BEFORE BREAKFAST 90 tablet 0   glucose blood test strip Use as instructed to blood  sugar twice daily 100 each 12   levothyroxine (SYNTHROID, LEVOTHROID) 100 MCG tablet TAKE 1 TABLET BY MOUTH ONCE DAILY 90 tablet 0   lisinopril (PRINIVIL,ZESTRIL) 20 MG tablet Take 1 tablet (20 mg total) by mouth daily. 90 tablet 3   pantoprazole (PROTONIX) 40 MG tablet Take 40 mg by mouth 2 (two) times daily.     No current facility-administered medications for this visit.     Allergies:   Pioglitazone; Macrobid [nitrofurantoin macrocrystal]; and Nitrofurantoin    Social History:  The patient  reports that she has been smoking. She has a 50.00 pack-year smoking history. She has never used smokeless tobacco. She reports that she does not drink alcohol or use drugs.   Family History:  The patient's family history includes Alcohol abuse in her son; Bipolar disorder in her son; Brain cancer in her mother; Diabetes in her brother; Lung disease in her father.    ROS:  Please see the history of present illness.   Otherwise, review of systems are positive for none.   All other systems are reviewed and negative.    PHYSICAL EXAM: VS:  BP (!) 160/70 (BP Location: Left Arm, Patient Position: Sitting, Cuff Size: Normal)    Pulse (!) 55    Ht 5\' 2"  (1.575 m)    Wt 144 lb 4 oz (65.4 kg)    LMP 03/10/1991    BMI 26.38 kg/m  , BMI Body mass index is 26.38 kg/m. GEN: Well nourished, well developed, in no acute distress  HEENT: normal  Neck: no JVD, carotid bruits, or masses Cardiac: RRR; no murmurs, rubs, or gallops,no edema  Respiratory:  clear to auscultation bilaterally, normal work of breathing GI: soft, nontender, nondistended, + BS MS: no deformity or atrophy  Skin: warm and dry, no rash Neuro:  Strength and sensation are intact Psych: euthymic mood, full affect Distal pulses are palpable.   EKG:  EKG is ordered today. The ekg ordered today demonstrates sinus bradycardia with anterior T wave changes suggestive of ischemia.  Recent Labs: 08/03/2017: Hemoglobin 13.4; Platelets  153 08/22/2017: BUN 11; Creatinine, Ser 1.05; Magnesium 1.9; Potassium 5.1; Sodium 139 10/18/2017: ALT 15    Lipid Panel    Component Value Date/Time   CHOL 134 10/18/2017 1447   TRIG 150 (H) 10/18/2017 1447   HDL 41 10/18/2017 1447   CHOLHDL 3.3 10/18/2017 1447   CHOLHDL 4 03/13/2017 1438   VLDL 28.8 03/13/2017 1438   LDLCALC 63 10/18/2017 1447   LDLDIRECT 71 10/18/2017 1447   LDLDIRECT 156.0 08/29/2016 1523      Wt Readings from Last 3 Encounters:  04/19/18 144 lb 4 oz (65.4 kg)  10/18/17 145 lb 4 oz (65.9 kg)  09/19/17 148 lb (67.1 kg)       PAD Screen 07/21/2017  Previous PAD dx? No  Previous surgical procedure? No  Pain with walking? No  Feet/toe relief with dangling? No  Painful, non-healing ulcers? No  Extremities discolored? No      ASSESSMENT AND PLAN:  1.  Coronary artery disease involving native coronary arteries without angina: She is overall doing well with no anginal symptoms.  I recommend continuing medical therapy.  2.  Hyperlipidemia: She is tolerating atorvastatin with significant improvement in LDL which was down to 63.  3.  Tobacco use: I again discussed with her the importance of smoking cessation and offered treatment with Chantix but she is concerned about the cost and side effects.  I am going to see if we can refer her to a smoking cessation class.  4.  Essential hypertension: Blood pressure is elevated but she ran out of lisinopril about 1 month ago.  I refilled the medication and expressed the importance of taking medications regularly.    Disposition:   FU with me in 6 months  I, Jesus Reyes am acting as a Education administrator for Kathlyn Sacramento, M.D.  I have reviewed the above documentation for accuracy and completeness, and I agree with the above.   Signed, Kathlyn Sacramento, MD 04/19/18 [place], [place]

## 2018-04-19 ENCOUNTER — Ambulatory Visit (INDEPENDENT_AMBULATORY_CARE_PROVIDER_SITE_OTHER): Payer: Medicare Other | Admitting: Cardiovascular Disease

## 2018-04-19 ENCOUNTER — Encounter: Payer: Self-pay | Admitting: Cardiovascular Disease

## 2018-04-19 ENCOUNTER — Other Ambulatory Visit: Payer: Self-pay

## 2018-04-19 VITALS — BP 160/70 | HR 55 | Ht 62.0 in | Wt 144.2 lb

## 2018-04-19 DIAGNOSIS — I2510 Atherosclerotic heart disease of native coronary artery without angina pectoris: Secondary | ICD-10-CM

## 2018-04-19 DIAGNOSIS — Z720 Tobacco use: Secondary | ICD-10-CM

## 2018-04-19 DIAGNOSIS — E785 Hyperlipidemia, unspecified: Secondary | ICD-10-CM

## 2018-04-19 DIAGNOSIS — I10 Essential (primary) hypertension: Secondary | ICD-10-CM | POA: Diagnosis not present

## 2018-04-19 MED ORDER — LISINOPRIL 20 MG PO TABS: 20.0000 mg | ORAL_TABLET | Freq: Every day | ORAL | 1 refills | Status: DC

## 2018-04-19 NOTE — Patient Instructions (Signed)

## 2018-04-26 ENCOUNTER — Ambulatory Visit (INDEPENDENT_AMBULATORY_CARE_PROVIDER_SITE_OTHER): Payer: Medicare Other

## 2018-04-26 ENCOUNTER — Other Ambulatory Visit: Payer: Self-pay

## 2018-04-26 ENCOUNTER — Ambulatory Visit (INDEPENDENT_AMBULATORY_CARE_PROVIDER_SITE_OTHER): Payer: Medicare Other | Admitting: Internal Medicine

## 2018-04-26 VITALS — BP 144/80 | HR 58 | Temp 98.3°F | Resp 16

## 2018-04-26 DIAGNOSIS — R05 Cough: Secondary | ICD-10-CM | POA: Diagnosis not present

## 2018-04-26 DIAGNOSIS — S60212A Contusion of left wrist, initial encounter: Secondary | ICD-10-CM | POA: Diagnosis not present

## 2018-04-26 DIAGNOSIS — M79642 Pain in left hand: Secondary | ICD-10-CM

## 2018-04-26 DIAGNOSIS — R059 Cough, unspecified: Secondary | ICD-10-CM

## 2018-04-26 DIAGNOSIS — I10 Essential (primary) hypertension: Secondary | ICD-10-CM

## 2018-04-26 DIAGNOSIS — M25532 Pain in left wrist: Secondary | ICD-10-CM

## 2018-04-26 DIAGNOSIS — S60222A Contusion of left hand, initial encounter: Secondary | ICD-10-CM | POA: Diagnosis not present

## 2018-04-26 DIAGNOSIS — I2510 Atherosclerotic heart disease of native coronary artery without angina pectoris: Secondary | ICD-10-CM

## 2018-04-26 DIAGNOSIS — E119 Type 2 diabetes mellitus without complications: Secondary | ICD-10-CM

## 2018-04-26 DIAGNOSIS — S60221A Contusion of right hand, initial encounter: Secondary | ICD-10-CM | POA: Diagnosis not present

## 2018-04-26 DIAGNOSIS — R2232 Localized swelling, mass and lump, left upper limb: Secondary | ICD-10-CM | POA: Diagnosis not present

## 2018-04-26 NOTE — Patient Instructions (Signed)
Saline nasal spray - flush nose at least 2-3x/day  nasacort nasal spray - 2 sprays each nostril one time per day.  Do this in the evening.    Robitussin DM twice a day as needed.

## 2018-04-26 NOTE — Progress Notes (Signed)
Patient ID: Ann Collins, female   DOB: 1945-08-14, 73 y.o.   MRN: [id number]   Subjective:    Patient ID: Ann Collins, female    DOB: Mar 05, 1945, 73 y.o.   MRN: [id number]  HPI  Patient here as a work in with concerns regarding persistent wrist and hand pain s/p fall.  States she was pulling up a metal piece in the ground.  Fell back.  Landed on her left hand.  Did not hit her head.  No other injury.  Fall occurred 04/09/18.  Has been painful since. Wearing a splint.  Persistent pain and swelling.  Did take antiinflammatory today.  Has to limit amount of antiinflammatory.  She also reports previous cough and congestion.  Was worse.  Getting better.  Some drainage.  No sinus pressure.  Some increased cough and chest congestion.  No sob.  No wheezing.  Eating.  Does feel better.     Past Medical History:  Diagnosis Date   Abdominal hernia    CAD (coronary artery disease)    a. LHC 7/19: LM nl, p-mLAD 100% w/ right to left collaterals, OM2 90% (2 mm), 1st LPL 40%, medical management   Diabetes mellitus ([place])    Diverticulosis    GERD (gastroesophageal reflux disease)    Hematuria    Hypercholesterolemia    Hypertension    Hypothyroidism    Kidney stones    Past Surgical History:  Procedure Laterality Date   APPENDECTOMY     BREAST BIOPSY  1969   right   CHOLECYSTECTOMY  2005   COLONOSCOPY     COLONOSCOPY WITH PROPOFOL N/A 09/19/2017   Procedure: COLONOSCOPY WITH PROPOFOL;  Surgeon: Toledo, Benay Pike, MD;  Location: [facility];  Service: Gastroenterology;  Laterality: N/A;   ESOPHAGOGASTRODUODENOSCOPY (EGD) WITH PROPOFOL N/A 09/19/2017   Procedure: ESOPHAGOGASTRODUODENOSCOPY (EGD) WITH PROPOFOL;  Surgeon: Toledo, Benay Pike, MD;  Location: [facility];  Service: Gastroenterology;  Laterality: N/A;   LEFT HEART CATH AND CORONARY ANGIOGRAPHY Left 08/07/2017   Procedure: LEFT HEART CATH AND CORONARY ANGIOGRAPHY;  Surgeon: Wellington Hampshire, MD;  Location: [facility];  Service: Cardiovascular;  Laterality: Left;   UPPER GI ENDOSCOPY     Family History  Problem Relation Age of Onset   Brain cancer Mother        history of brain tumor   Lung disease Father    Diabetes Brother        x2   Bipolar disorder Son    Alcohol abuse Son    Breast cancer Neg Hx    Colon cancer Neg Hx    Social History   Socioeconomic History   Marital status: Married    Spouse name: Not on file   Number of children: Not on file   Years of education: Not on file   Highest education level: Not on file  Occupational History   Not on file  Social Needs   Financial resource strain: Not hard at all   Food insecurity:    Worry: Never true    Inability: Never true   Transportation needs:    Medical: No    Non-medical: No  Tobacco Use   Smoking status: Current Every Day Smoker    Packs/day: 1.00    Years: 50.00    Pack years: 50.00   Smokeless tobacco: Never Used  Substance and Sexual Activity   Alcohol use: No    Alcohol/week: 0.0 standard drinks   Drug use: No  Sexual activity: Not on file  Lifestyle   Physical activity:    Days per week: Not on file    Minutes per session: Not on file   Stress: Not on file  Relationships   Social connections:    Talks on phone: Not on file    Gets together: Not on file    Attends religious service: Not on file    Active member of club or organization: Not on file    Attends meetings of clubs or organizations: Not on file    Relationship status: Not on file  Other Topics Concern   Not on file  Social History Narrative   Not on file    Outpatient Encounter Medications as of 04/26/2018  Medication Sig   aspirin 81 MG tablet Take 81 mg by mouth daily.   atorvastatin (LIPITOR) 40 MG tablet Take 1 tablet (40 mg total) by mouth daily.   carvedilol (COREG) 3.125 MG tablet Take 1 tablet (3.125 mg total) by mouth 2 (two) times daily.   Cyanocobalamin 1500 MCG TBDP Take 1,500  mcg by mouth daily.   glipiZIDE (GLUCOTROL XL) 2.5 MG 24 hr tablet TAKE 1 TABLET BY MOUTH ONCE DAILY BEFORE BREAKFAST   glucose blood test strip Use as instructed to blood sugar twice daily   levothyroxine (SYNTHROID, LEVOTHROID) 100 MCG tablet TAKE 1 TABLET BY MOUTH ONCE DAILY   lisinopril (PRINIVIL,ZESTRIL) 20 MG tablet Take 1 tablet (20 mg total) by mouth daily.   pantoprazole (PROTONIX) 40 MG tablet Take 40 mg by mouth 2 (two) times daily.   No facility-administered encounter medications on file as of 04/26/2018.     Review of Systems  Constitutional: Negative for appetite change and fever.  HENT: Positive for congestion and postnasal drip. Negative for sinus pressure.   Respiratory: Positive for cough. Negative for chest tightness, shortness of breath and wheezing.   Cardiovascular: Negative for chest pain and leg swelling.  Gastrointestinal: Negative for diarrhea, nausea and vomiting.  Musculoskeletal: Negative for myalgias.       Left hand/wrist pain and swelling.    Skin: Negative for color change and rash.  Neurological: Negative for dizziness, light-headedness and headaches.  Psychiatric/Behavioral: Negative for agitation and dysphoric mood.       Objective:    Physical Exam Constitutional:      General: She is not in acute distress.    Appearance: Normal appearance.  HENT:     Nose: Nose normal. No congestion.     Mouth/Throat:     Pharynx: No oropharyngeal exudate or posterior oropharyngeal erythema.  Neck:     Thyroid: No thyromegaly.  Cardiovascular:     Rate and Rhythm: Normal rate and regular rhythm.  Pulmonary:     Effort: No respiratory distress.     Breath sounds: Normal breath sounds. No wheezing.  Musculoskeletal:     Comments: Increased pain base of 4th/5th finger.  Some soft tissue swelling - hand/wrist.  Some increased tenderness to palpation - left wrist.  No increased erythema.    Skin:    Findings: No erythema or rash.  Neurological:      Mental Status: She is alert.  Psychiatric:        Mood and Affect: Mood normal.        Behavior: Behavior normal.     BP (!) 144/80    Pulse (!) 58    Temp 98.3 F (36.8 C) (Oral)    Resp 16    LMP 03/10/1991  Wt  Readings from Last 3 Encounters:  04/19/18 144 lb 4 oz (65.4 kg)  10/18/17 145 lb 4 oz (65.9 kg)  09/19/17 148 lb (67.1 kg)     Lab Results  Component Value Date   WBC 7.3 08/03/2017   HGB 13.4 08/03/2017   HCT 40.0 08/03/2017   PLT 153 08/03/2017   GLUCOSE 286 (H) 08/22/2017   CHOL 134 10/18/2017   TRIG 150 (H) 10/18/2017   HDL 41 10/18/2017   LDLDIRECT 71 10/18/2017   LDLCALC 63 10/18/2017   ALT 15 10/18/2017   AST 14 10/18/2017   NA 139 08/22/2017   K 5.1 08/22/2017   CL 99 08/22/2017   CREATININE 1.05 (H) 08/22/2017   BUN 11 08/22/2017   CO2 23 08/22/2017   TSH 1.26 12/06/2016   INR 0.87 08/07/2017   HGBA1C 7.7 (H) 03/13/2017   MICROALBUR <0.7 08/29/2016       Assessment & Plan:   Problem List Items Addressed This Visit    Cough    With increased cough and congestion.  Better.  Some drainage.  Saline nasal spray and nasacort as directed.  Robitussin DM.  Follow.  Notify me if persistent.  Needs to quit smoking.        Diabetes mellitus ([place])    Overdue labs and f/u with me.  Will schedule f/u appt.        Hypertension    Blood pressure slightly elevated today.  In for acute visit with hand/wrist pain.  Have her spot check her pressure.  Overdue f/u with me.  Schedule appt.        Wrist pain, left    Persistent left wrist and hand pain s/p fall.  Increased pain and soft tissue swelling.  She has been wearing a splint.  Will xray.  Discussed ortho referral.  Await xray results.         Other Visit Diagnoses    Left wrist pain    -  Primary   Relevant Orders   DG Wrist Complete Left (Completed)   Left hand pain       Relevant Orders   DG Hand Complete Left (Completed)       Einar Pheasant, MD

## 2018-04-27 ENCOUNTER — Encounter: Payer: Self-pay | Admitting: Internal Medicine

## 2018-04-27 ENCOUNTER — Telehealth: Payer: Self-pay | Admitting: Internal Medicine

## 2018-04-27 DIAGNOSIS — M25532 Pain in left wrist: Secondary | ICD-10-CM

## 2018-04-27 DIAGNOSIS — S52502A Unspecified fracture of the lower end of left radius, initial encounter for closed fracture: Secondary | ICD-10-CM | POA: Diagnosis not present

## 2018-04-27 DIAGNOSIS — R059 Cough, unspecified: Secondary | ICD-10-CM | POA: Insufficient documentation

## 2018-04-27 DIAGNOSIS — S62102A Fracture of unspecified carpal bone, left wrist, initial encounter for closed fracture: Secondary | ICD-10-CM | POA: Insufficient documentation

## 2018-04-27 DIAGNOSIS — R05 Cough: Secondary | ICD-10-CM | POA: Insufficient documentation

## 2018-04-27 NOTE — Assessment & Plan Note (Signed)
Persistent left wrist and hand pain s/p fall.  Increased pain and soft tissue swelling.  She has been wearing a splint.  Will xray.  Discussed ortho referral.  Await xray results.

## 2018-04-27 NOTE — Assessment & Plan Note (Signed)
Overdue labs and f/u with me.  Will schedule f/u appt.

## 2018-04-27 NOTE — Telephone Encounter (Signed)
Pt came in wanting to make appt for see Dr Arva Chafe. Pt has a note stating to schedule fasting labs. Order is needed please and Thank you!

## 2018-04-27 NOTE — Telephone Encounter (Signed)
I have scheduled her a f/u appt with you the same day she is supposed to see Denisa on 4/7. She is getting fasting labs done on April 2

## 2018-04-27 NOTE — Assessment & Plan Note (Signed)
Blood pressure slightly elevated today.  In for acute visit with hand/wrist pain.  Have her spot check her pressure.  Overdue f/u with me.  Schedule appt.

## 2018-04-27 NOTE — Assessment & Plan Note (Signed)
With increased cough and congestion.  Better.  Some drainage.  Saline nasal spray and nasacort as directed.  Robitussin DM.  Follow.  Notify me if persistent.  Needs to quit smoking.

## 2018-05-10 ENCOUNTER — Other Ambulatory Visit: Payer: Medicare Other

## 2018-05-15 ENCOUNTER — Ambulatory Visit: Payer: Medicare Other | Admitting: Internal Medicine

## 2018-05-15 ENCOUNTER — Ambulatory Visit: Payer: Medicare Other

## 2018-05-30 ENCOUNTER — Other Ambulatory Visit: Payer: Self-pay

## 2018-05-30 ENCOUNTER — Ambulatory Visit (INDEPENDENT_AMBULATORY_CARE_PROVIDER_SITE_OTHER): Payer: Medicare Other

## 2018-05-30 ENCOUNTER — Ambulatory Visit: Payer: Medicare Other

## 2018-05-30 DIAGNOSIS — Z0000 Encounter for general adult medical examination without abnormal findings: Secondary | ICD-10-CM

## 2018-05-30 NOTE — Progress Notes (Signed)
Subjective:   TAZARIA DLUGOSZ is a 73 y.o. female who presents for Medicare Annual (Subsequent) preventive examination.  Review of Systems:  No ROS.  Medicare Wellness Visit. Additional risk factors are reflected in the social history.  Cardiac Risk Factors include: diabetes mellitus;hypertension;smoking/ tobacco exposure;advanced age (>108men, >59 women)     Objective:     Vitals: LMP 03/10/1991   There is no height or weight on file to calculate BMI. UTA vital signs, virtual visit.  Advanced Directives 05/30/2018 09/19/2017 08/07/2017 05/12/2017  Does Patient Have a Medical Advance Directive? Yes No No Yes  Type of Paramedic of [place];Living will - - [place];Living will  Does patient want to make changes to medical advance directive? No - Patient declined - - No - Patient declined  Copy of [place] in Chart? No - copy requested - - No - copy requested  Would patient like information on creating a medical advance directive? - - No - Patient declined -    Tobacco Social History   Tobacco Use  Smoking Status Current Every Day Smoker  . Packs/day: 1.00  . Years: 50.00  . Pack years: 50.00  Smokeless Tobacco Never Used     Ready to quit: Not Answered Counseling given: Not Answered   Clinical Intake:  Pre-visit preparation completed: Yes        Diabetes: Yes(Followed by pcp)  How often do you need to have someone help you when you read instructions, pamphlets, or other written materials from your doctor or pharmacy?: 1 - Never  Interpreter Needed?: No     Past Medical History:  Diagnosis Date  . Abdominal hernia   . CAD (coronary artery disease)    a. LHC 7/19: LM nl, p-mLAD 100% w/ right to left collaterals, OM2 90% (2 mm), 1st LPL 40%, medical management  . Diabetes mellitus ([place])   . Diverticulosis   . GERD (gastroesophageal reflux disease)   . Hematuria   . Hypercholesterolemia   .  Hypertension   . Hypothyroidism   . Kidney stones    Past Surgical History:  Procedure Laterality Date  . APPENDECTOMY    . BREAST BIOPSY  1969   right  . CHOLECYSTECTOMY  2005  . COLONOSCOPY    . COLONOSCOPY WITH PROPOFOL N/A 09/19/2017   Procedure: COLONOSCOPY WITH PROPOFOL;  Surgeon: Toledo, Benay Pike, MD;  Location: [facility];  Service: Gastroenterology;  Laterality: N/A;  . ESOPHAGOGASTRODUODENOSCOPY (EGD) WITH PROPOFOL N/A 09/19/2017   Procedure: ESOPHAGOGASTRODUODENOSCOPY (EGD) WITH PROPOFOL;  Surgeon: Toledo, Benay Pike, MD;  Location: [facility];  Service: Gastroenterology;  Laterality: N/A;  . LEFT HEART CATH AND CORONARY ANGIOGRAPHY Left 08/07/2017   Procedure: LEFT HEART CATH AND CORONARY ANGIOGRAPHY;  Surgeon: Wellington Hampshire, MD;  Location: [facility];  Service: Cardiovascular;  Laterality: Left;  . UPPER GI ENDOSCOPY     Family History  Problem Relation Age of Onset  . Brain cancer Mother        history of brain tumor  . Lung disease Father   . Diabetes Brother        x2  . Bipolar disorder Son   . Alcohol abuse Son   . Breast cancer Neg Hx   . Colon cancer Neg Hx    Social History   Socioeconomic History  . Marital status: Married    Spouse name: Not on file  . Number of children: Not on file  . Years  of education: Not on file  . Highest education level: Not on file  Occupational History  . Not on file  Social Needs  . Financial resource strain: Not hard at all  . Food insecurity:    Worry: Never true    Inability: Never true  . Transportation needs:    Medical: No    Non-medical: No  Tobacco Use  . Smoking status: Current Every Day Smoker    Packs/day: 1.00    Years: 50.00    Pack years: 50.00  . Smokeless tobacco: Never Used  Substance and Sexual Activity  . Alcohol use: No    Alcohol/week: 0.0 standard drinks  . Drug use: No  . Sexual activity: Not on file  Lifestyle  . Physical activity:    Days per week: 2 days     Minutes per session: 20 min  . Stress: Not at all  Relationships  . Social connections:    Talks on phone: Not on file    Gets together: Not on file    Attends religious service: Not on file    Active member of club or organization: Not on file    Attends meetings of clubs or organizations: Not on file    Relationship status: Not on file  Other Topics Concern  . Not on file  Social History Narrative  . Not on file    Outpatient Encounter Medications as of 05/30/2018  Medication Sig  . aspirin 81 MG tablet Take 81 mg by mouth daily.  Marland Kitchen atorvastatin (LIPITOR) 40 MG tablet Take 1 tablet (40 mg total) by mouth daily.  . carvedilol (COREG) 3.125 MG tablet Take 1 tablet (3.125 mg total) by mouth 2 (two) times daily.  . Cyanocobalamin 1500 MCG TBDP Take 1,500 mcg by mouth daily.  Marland Kitchen glipiZIDE (GLUCOTROL XL) 2.5 MG 24 hr tablet TAKE 1 TABLET BY MOUTH ONCE DAILY BEFORE BREAKFAST  . glucose blood test strip Use as instructed to blood sugar twice daily  . levothyroxine (SYNTHROID, LEVOTHROID) 100 MCG tablet TAKE 1 TABLET BY MOUTH ONCE DAILY  . lisinopril (PRINIVIL,ZESTRIL) 20 MG tablet Take 1 tablet (20 mg total) by mouth daily.  . pantoprazole (PROTONIX) 40 MG tablet Take 40 mg by mouth 2 (two) times daily.   No facility-administered encounter medications on file as of 05/30/2018.     Activities of Daily Living In your present state of health, do you have any difficulty performing the following activities: 05/30/2018  Hearing? N  Vision? N  Difficulty concentrating or making decisions? N  Walking or climbing stairs? N  Dressing or bathing? N  Doing errands, shopping? N  Preparing Food and eating ? N  Using the Toilet? N  In the past six months, have you accidently leaked urine? N  Do you have problems with loss of bowel control? N  Managing your Medications? N  Managing your Finances? N  Housekeeping or managing your Housekeeping? N  Some recent data might be hidden    Patient Care  Team: Einar Pheasant, MD as PCP - General (Internal Medicine)    Assessment:   This is a routine wellness examination for [place].  I connected with patient 05/30/18 at 11:30 AM EDT by a video enabled telemedicine application and verified that I am speaking with the correct person using two identifiers. Patient stated full name and DOB. Patient gave permission to continue with virtual visit. Patient's location was at home and Nurse's location was at [place] office.   Tobacco use- she  currently smokes cigarettes but is interested in quitting. Notes she went to [facility] for a cessation program and was told she was not a candidate.  She denies knowledge as to why she did not qualify. She is in agreement to have a virtual visit for follow up with primary care provider as needed. Email on file.  Reports left wrist healing slowly with swelling in the hand and fingers. Wears support tape and brace daily. She is completing wrist/hand therapy exercises as directed. Taking Aleve BID for pain. She is in agreement to have a virtual visit for follow up with primary care provider as needed. Email on file.  Currently scheduled for labs on 6/4 and follow up with pcp on 6/9.  Diabetes- notes she does not monitor BS.   Health Screenings  Mammogram -03/31/17 Colonoscopy -09/19/17 Bone density- discussed Glaucoma -none reported Hearing -demonstrates normal hearing during conversation. Cholesterol -10/18/17 (134) Dental- every 6 months Vision- encouraged to schedule diabetic eye exam.  Social  Alcohol intake -no Smoking history- current Smokers in home? self Illicit drug use? none Exercise -walking Diet -low carb  Safety  Patient feels safe at home.  Patient does have smoke detectors at home  Patient does wear sunscreen or protective clothing when in direct sunlight. Patient does wear seat belt when driving or riding with others.   Activities of Daily Living Patient can do their own household chores.  Denies needing assistance with: driving, feeding themselves, getting from bed to chair, getting to the toilet, bathing/showering, dressing, managing money, climbing flight of stairs, or preparing meals.   Depression Screen Patient denies losing interest in daily life, feeling hopeless, or crying easily over simple problems.   Fall Screen 1 fall in the last 12 months. She has seen her pcp for left wrist pain due to this fall. Followed by pcp.    Memory Screen Patient denies problems with memory, misplacing items, and is able to balance checkbook/bank accounts.  Patient is alert, normal appearance, oriented to person/place/and time. Correctly identified the president of the [place], recall of 2/3 objects, and performing simple calculations.  Patient displays appropriate judgement and can read correct time from watch face.  She plays computer games and reads a little to encourage brain exercise.   Immunizations The following Immunizations were discussed: Influenza, shingles, pneumonia, and tetanus.   Other Providers Patient Care Team: Einar Pheasant, MD as PCP - General (Internal Medicine)  Exercise Activities and Dietary recommendations    Goals      Patient Stated   . Increase physical activity (pt-stated)     Return to the gym and resume exercises       Fall Risk Fall Risk  05/30/2018 05/12/2017 12/09/2016 11/30/2015 09/21/2015  Falls in the past year? 0 No No No No  Number falls in past yr: - - - - -  Injury with Fall? - - - - -  Risk for fall due to : - - - - -   Depression Screen PHQ 2/9 Scores 05/30/2018 05/12/2017 12/09/2016 06/10/2016  PHQ - 2 Score 0 0 0 0  PHQ- 9 Score - - - 0     Cognitive Function     6CIT Screen 05/30/2018 05/12/2017  What Year? 0 points 0 points  What month? 0 points 0 points  What time? 0 points 0 points  Count back from 20 0 points 0 points  Months in reverse 0 points 0 points  Repeat phrase - 0 points  Total Score - 0  Immunization History   Administered Date(s) Administered  . Influenza Split 10/16/2013  . Influenza Whole 11/24/2016  . Influenza, High Dose Seasonal PF 11/05/2017  . Influenza-Unspecified 11/07/2012, 10/27/2014  . Pneumococcal Conjugate-13 03/02/2016  . Pneumococcal Polysaccharide-23 12/08/2012, 03/02/2017  . Tdap 10/14/2013   Screening Tests Health Maintenance  Topic Date Due  . DEXA SCAN  11/17/2010  . FOOT EXAM  07/05/2013  . HEMOGLOBIN A1C  09/10/2017  . OPHTHALMOLOGY EXAM  12/13/2017  . MAMMOGRAM  03/31/2018  . INFLUENZA VACCINE  09/08/2018  . Fecal DNA (Cologuard)  06/14/2020  . TETANUS/TDAP  10/15/2023  . Hepatitis C Screening  Completed  . PNA vac Low Risk Adult  Completed      Plan:    End of life planning; Advance aging; Advanced directives discussed. Copy of current HCPOA/Living Will requested.    I have personally reviewed and noted the following in the patient's chart:   . Medical and social history . Use of alcohol, tobacco or illicit drugs  . Current medications and supplements . Functional ability and status . Nutritional status . Physical activity . Advanced directives . List of other physicians . Hospitalizations, surgeries, and ER visits in previous 12 months . Vitals . Screenings to include cognitive, depression, and falls . Referrals and appointments  In addition, I have reviewed and discussed with patient certain preventive protocols, quality metrics, and best practice recommendations. A written personalized care plan for preventive services as well as general preventive health recommendations were provided to patient.     Varney Biles, LPN  10/29/1939   Reviewed above information.  Agree with assessment and plan.   Dr Nicki Reaper

## 2018-05-30 NOTE — Patient Instructions (Addendum)
  Ann Collins , Thank you for taking time to come for your Medicare Wellness Visit. I appreciate your ongoing commitment to your health goals. Please review the following plan we discussed and let me know if I can assist you in the future.   These are the goals we discussed: Goals      Patient Stated   . Increase physical activity (pt-stated)     Return to the gym and resume exercises       This is a list of the screening recommended for you and due dates:  Health Maintenance  Topic Date Due  . DEXA scan (bone density measurement)  11/17/2010  . Complete foot exam   07/05/2013  . Hemoglobin A1C  09/10/2017  . Eye exam for diabetics  12/13/2017  . Mammogram  03/31/2018  . Flu Shot  09/08/2018  . Cologuard (Stool DNA test)  06/14/2020  . Tetanus Vaccine  10/15/2023  .  Hepatitis C: One time screening is recommended by Center for Disease Control  (CDC) for  adults born from [birth year] through 1965.   Completed  . Pneumonia vaccines  Completed

## 2018-06-01 ENCOUNTER — Telehealth: Payer: Self-pay | Admitting: Internal Medicine

## 2018-06-01 NOTE — Telephone Encounter (Signed)
-----   Message from Lars Masson, LPN sent at 2/83/6629  8:51 AM EDT ----- Regarding: RE: f/u Pt would like to wait until her appt in June to discuss stopping smoking. She is going to call ortho about her wrist ----- Message ----- From: Einar Pheasant, MD Sent: 05/30/2018   9:19 PM EDT To: Lars Masson, LPN Subject: f/u                                            I received a note from [place].  Ann Collins had a wellness visit with her and mentioned she was having increased swelling in her wrist and also mentioned she wants to quit smoking.  Apparently Ann Collins discussed a f/u visit with me.  It does not appear she has a visit scheduled until 07/2018.  If she is having persistent swelling in her wrist/hand - I recommend her f/u with ortho.  She saw them for the fracture.  If wants appt to discuss stopping smoking, ok to schedule.  See me before calling pt.    Dr Nicki Reaper

## 2018-06-14 ENCOUNTER — Other Ambulatory Visit: Payer: Self-pay

## 2018-06-14 ENCOUNTER — Telehealth: Payer: Self-pay | Admitting: Internal Medicine

## 2018-06-14 MED ORDER — LEVOTHYROXINE SODIUM 100 MCG PO TABS: 100.0000 ug | ORAL_TABLET | Freq: Every day | ORAL | 0 refills | Status: DC

## 2018-06-14 NOTE — Telephone Encounter (Signed)
Copied from [place] [id number]. Topic: Quick Communication - Rx Refill/Question >> Jun 14, 2018  9:32 AM Antonieta Iba C wrote: Medication: levothyroxine (SYNTHROID, LEVOTHROID) 100 MCG tablet   Has the patient contacted their pharmacy? Yes   (Agent: If no, request that the patient contact the pharmacy for the refill.) (Agent: If yes, when and what did the pharmacy advise?)  Preferred Pharmacy (with phone number or street name): [place], [place] - [address] AT [facility] [phone] (Phone) [phone] (Fax)    Agent: Please be advised that RX refills may take up to 3 business days. We ask that you follow-up with your pharmacy.

## 2018-06-14 NOTE — Telephone Encounter (Signed)
Refill sent to pharmacy, per pt request for levothyroxine.  Nina,cma

## 2018-06-28 ENCOUNTER — Telehealth: Payer: Self-pay | Admitting: Internal Medicine

## 2018-06-28 MED ORDER — GLIPIZIDE ER 2.5 MG PO TB24: ORAL_TABLET | ORAL | 0 refills | Status: DC

## 2018-06-28 NOTE — Telephone Encounter (Signed)
No A1c since 03/2017 ok to fill ?

## 2018-06-28 NOTE — Telephone Encounter (Signed)
Copied from [place] [id number]. Topic: Quick Communication - Rx Refill/Question >> Jun 28, 2018 12:11 PM Ann Collins, [place] C wrote: Medication: glipiZIDE (GLUCOTROL XL) 2.5 MG 24 hr table  Has the patient contacted their pharmacy? No  (Agent: If no, request that the patient contact the pharmacy for the refill.) (Agent: If yes, when and what did the pharmacy advise?)  Preferred Pharmacy (with phone number or street name): [place], [place] - [place] AT [facility] [phone] (Phone) [phone] (Fax)    Agent: Please be advised that RX refills may take up to 3 business days. We ask that you follow-up with your pharmacy.

## 2018-06-28 NOTE — Telephone Encounter (Signed)
Ok to refill x 1.  Has labs schedule for 07/12/18.  Needs to keep appt.

## 2018-06-28 NOTE — Telephone Encounter (Signed)
Medication filled.  

## 2018-07-11 ENCOUNTER — Telehealth: Payer: Self-pay | Admitting: *Deleted

## 2018-07-11 DIAGNOSIS — E119 Type 2 diabetes mellitus without complications: Secondary | ICD-10-CM

## 2018-07-11 DIAGNOSIS — E7800 Pure hypercholesterolemia, unspecified: Secondary | ICD-10-CM

## 2018-07-11 DIAGNOSIS — E039 Hypothyroidism, unspecified: Secondary | ICD-10-CM

## 2018-07-11 DIAGNOSIS — I10 Essential (primary) hypertension: Secondary | ICD-10-CM

## 2018-07-11 NOTE — Telephone Encounter (Signed)
Please place future orders for lab appt.  

## 2018-07-11 NOTE — Telephone Encounter (Signed)
Order placed for labs.

## 2018-07-12 ENCOUNTER — Other Ambulatory Visit: Payer: Self-pay

## 2018-07-12 ENCOUNTER — Other Ambulatory Visit (INDEPENDENT_AMBULATORY_CARE_PROVIDER_SITE_OTHER): Payer: Medicare Other

## 2018-07-12 DIAGNOSIS — E7800 Pure hypercholesterolemia, unspecified: Secondary | ICD-10-CM | POA: Diagnosis not present

## 2018-07-12 DIAGNOSIS — E119 Type 2 diabetes mellitus without complications: Secondary | ICD-10-CM | POA: Diagnosis not present

## 2018-07-12 DIAGNOSIS — I10 Essential (primary) hypertension: Secondary | ICD-10-CM

## 2018-07-12 DIAGNOSIS — E039 Hypothyroidism, unspecified: Secondary | ICD-10-CM

## 2018-07-12 LAB — BASIC METABOLIC PANEL
BUN: 12 mg/dL (ref 6–23)
CO2: 25 mEq/L (ref 19–32)
Calcium: 9.4 mg/dL (ref 8.4–10.5)
Chloride: 106 mEq/L (ref 96–112)
Creatinine, Ser: 0.94 mg/dL (ref 0.40–1.20)
GFR: 58.43 mL/min — ABNORMAL LOW (ref >60.00)
Glucose, Bld: 214 mg/dL — ABNORMAL HIGH (ref 70–99)
Potassium: 4 mEq/L (ref 3.5–5.1)
Sodium: 141 mEq/L (ref 135–145)

## 2018-07-12 LAB — MICROALBUMIN / CREATININE URINE RATIO
Creatinine,U: 41.1 mg/dL
Microalb Creat Ratio: 1.7 mg/g (ref 0.0–30.0)
Microalb, Ur: <0.7 mg/dL (ref 0.0–1.9)

## 2018-07-12 LAB — HEPATIC FUNCTION PANEL
ALT: 13 U/L (ref 0–35)
AST: 11 U/L (ref 0–37)
Albumin: 4 g/dL (ref 3.5–5.2)
Alkaline Phosphatase: 87 U/L (ref 39–117)
Bilirubin, Direct: 0.1 mg/dL (ref 0.0–0.3)
Total Bilirubin: 0.6 mg/dL (ref 0.2–1.2)
Total Protein: 6.4 g/dL (ref 6.0–8.3)

## 2018-07-12 LAB — CBC WITH DIFFERENTIAL/PLATELET
Basophils Absolute: 0.1 10*3/uL (ref 0.0–0.1)
Basophils Relative: 0.9 % (ref 0.0–3.0)
Eosinophils Absolute: 0.3 10*3/uL (ref 0.0–0.7)
Eosinophils Relative: 3.9 % (ref 0.0–5.0)
HCT: 40.3 % (ref 36.0–46.0)
Hemoglobin: 13.4 g/dL (ref 12.0–15.0)
Lymphocytes Relative: 54.2 % — ABNORMAL HIGH (ref 12.0–46.0)
Lymphs Abs: 4.4 10*3/uL — ABNORMAL HIGH (ref 0.7–4.0)
MCHC: 33.4 g/dL (ref 30.0–36.0)
MCV: 89.9 fl (ref 78.0–100.0)
Monocytes Absolute: 0.4 10*3/uL (ref 0.1–1.0)
Monocytes Relative: 5.1 % (ref 3.0–12.0)
Neutro Abs: 2.9 10*3/uL (ref 1.4–7.7)
Neutrophils Relative %: 35.9 % — ABNORMAL LOW (ref 43.0–77.0)
Platelets: 142 10*3/uL — ABNORMAL LOW (ref 150.0–400.0)
RBC: 4.48 Mil/uL (ref 3.87–5.11)
RDW: 15.3 % (ref 11.5–15.5)
WBC: 8 10*3/uL (ref 4.0–10.5)

## 2018-07-12 LAB — TSH: TSH: 1.52 u[IU]/mL (ref 0.35–4.50)

## 2018-07-12 LAB — LIPID PANEL
Cholesterol: 125 mg/dL (ref 0–200)
HDL: 34.7 mg/dL — ABNORMAL LOW (ref >39.00)
LDL Cholesterol: 58 mg/dL (ref 0–99)
NonHDL: 89.83
Total CHOL/HDL Ratio: 4
Triglycerides: 157 mg/dL — ABNORMAL HIGH (ref 0.0–149.0)
VLDL: 31.4 mg/dL (ref 0.0–40.0)

## 2018-07-12 LAB — HEMOGLOBIN A1C: Hgb A1c MFr Bld: 11.2 % — ABNORMAL HIGH (ref 4.6–6.5)

## 2018-07-17 ENCOUNTER — Other Ambulatory Visit: Payer: Self-pay

## 2018-07-17 ENCOUNTER — Encounter: Payer: Self-pay | Admitting: Internal Medicine

## 2018-07-17 ENCOUNTER — Ambulatory Visit (INDEPENDENT_AMBULATORY_CARE_PROVIDER_SITE_OTHER): Payer: Medicare Other | Admitting: Internal Medicine

## 2018-07-17 VITALS — BP 134/78

## 2018-07-17 DIAGNOSIS — I10 Essential (primary) hypertension: Secondary | ICD-10-CM

## 2018-07-17 DIAGNOSIS — I700 Atherosclerosis of aorta: Secondary | ICD-10-CM | POA: Diagnosis not present

## 2018-07-17 DIAGNOSIS — M79642 Pain in left hand: Secondary | ICD-10-CM | POA: Diagnosis not present

## 2018-07-17 DIAGNOSIS — Z1231 Encounter for screening mammogram for malignant neoplasm of breast: Secondary | ICD-10-CM

## 2018-07-17 DIAGNOSIS — S62102D Fracture of unspecified carpal bone, left wrist, subsequent encounter for fracture with routine healing: Secondary | ICD-10-CM

## 2018-07-17 DIAGNOSIS — K2100 Gastro-esophageal reflux disease with esophagitis, without bleeding: Secondary | ICD-10-CM

## 2018-07-17 DIAGNOSIS — E7800 Pure hypercholesterolemia, unspecified: Secondary | ICD-10-CM | POA: Diagnosis not present

## 2018-07-17 DIAGNOSIS — E1165 Type 2 diabetes mellitus with hyperglycemia: Secondary | ICD-10-CM | POA: Diagnosis not present

## 2018-07-17 DIAGNOSIS — M7989 Other specified soft tissue disorders: Secondary | ICD-10-CM

## 2018-07-17 DIAGNOSIS — I2510 Atherosclerotic heart disease of native coronary artery without angina pectoris: Secondary | ICD-10-CM

## 2018-07-17 DIAGNOSIS — E039 Hypothyroidism, unspecified: Secondary | ICD-10-CM | POA: Diagnosis not present

## 2018-07-17 NOTE — Progress Notes (Signed)
Patient ID: Ann Collins, female   DOB: 1945-04-20, 73 y.o.   MRN: [id number]   Virtual Visit via video Note  This visit type was conducted due to national recommendations for restrictions regarding the COVID-19 pandemic (e.g. social distancing).  This format is felt to be most appropriate for this patient at this time.  All issues noted in this document were discussed and addressed.  No physical exam was performed (except for noted visual exam findings with Video Visits).   I connected with Ann Collins by a video enabled telemedicine application and verified that I am speaking with the correct person using two identifiers. Location patient: home Location provider: work Persons participating in the virtual visit: patient, provider  I discussed the limitations, risks, security and privacy concerns of performing an evaluation and management service by video and the availability of in person appointments. The patient expressed understanding and agreed to proceed.   Reason for visit: scheduled follow up.   HPI: She reports she is doing relatively well.  Still having left hand and wrist pain.  S/p fall.  Wrist fracture.  See last note.  Was referred to ortho.  Was wearing a splint.  Persistent pain.  Has not followed up with ortho.  Discussed need for f/u.  She is in agreement.  No chest pain.  No sob.  No acid reflux.  No abdominal pain.  Bowels moving.  States blood pressure:  134/74 and 138/78.  Discussed labs a1c - 11.2.  She has not been checking her sugars.  Thought her machine was not working properly because sugars were elevated.  She has not been watching her diet.  Stays physically active.  Discussed low platelet count.  Has not been taking increased antiinflammatories.  No bleeding.     ROS: See pertinent positives and negatives per HPI.  Past Medical History:  Diagnosis Date  . Abdominal hernia   . CAD (coronary artery disease)    a. LHC 7/19: LM nl, p-mLAD 100% w/ right to left  collaterals, OM2 90% (2 mm), 1st LPL 40%, medical management  . Diabetes mellitus ([place])   . Diverticulosis   . GERD (gastroesophageal reflux disease)   . Hematuria   . Hypercholesterolemia   . Hypertension   . Hypothyroidism   . Kidney stones     Past Surgical History:  Procedure Laterality Date  . APPENDECTOMY    . BREAST BIOPSY  1969   right  . CHOLECYSTECTOMY  2005  . COLONOSCOPY    . COLONOSCOPY WITH PROPOFOL N/A 09/19/2017   Procedure: COLONOSCOPY WITH PROPOFOL;  Surgeon: Toledo, Benay Pike, MD;  Location: [facility] ENDOSCOPY;  Service: Gastroenterology;  Laterality: N/A;  . ESOPHAGOGASTRODUODENOSCOPY (EGD) WITH PROPOFOL N/A 09/19/2017   Procedure: ESOPHAGOGASTRODUODENOSCOPY (EGD) WITH PROPOFOL;  Surgeon: Toledo, Benay Pike, MD;  Location: [facility] ENDOSCOPY;  Service: Gastroenterology;  Laterality: N/A;  . LEFT HEART CATH AND CORONARY ANGIOGRAPHY Left 08/07/2017   Procedure: LEFT HEART CATH AND CORONARY ANGIOGRAPHY;  Surgeon: Wellington Hampshire, MD;  Location: [place] [facility];  Service: Cardiovascular;  Laterality: Left;  . UPPER GI ENDOSCOPY      Family History  Problem Relation Age of Onset  . Brain cancer Mother        history of brain tumor  . Lung disease Father   . Diabetes Brother        x2  . Bipolar disorder Son   . Alcohol abuse Son   . Breast cancer Neg Hx   . Colon  cancer Neg Hx     SOCIAL HX: reviewed.    Current Outpatient Medications:  .  aspirin 81 MG tablet, Take 81 mg by mouth daily., Disp: , Rfl:  .  atorvastatin (LIPITOR) 40 MG tablet, Take 1 tablet (40 mg total) by mouth daily., Disp: 90 tablet, Rfl: 3 .  carvedilol (COREG) 3.125 MG tablet, Take 1 tablet (3.125 mg total) by mouth 2 (two) times daily., Disp: 180 tablet, Rfl: 3 .  Cyanocobalamin 1500 MCG TBDP, Take 1,500 mcg by mouth daily., Disp: , Rfl:  .  glipiZIDE (GLUCOTROL XL) 2.5 MG 24 hr tablet, TAKE 1 TABLET BY MOUTH ONCE DAILY BEFORE BREAKFAST, Disp: 90 tablet, Rfl: 0 .  glucose blood test  strip, Use as instructed to blood sugar twice daily, Disp: 100 each, Rfl: 12 .  levothyroxine (SYNTHROID) 100 MCG tablet, Take 1 tablet (100 mcg total) by mouth daily., Disp: 90 tablet, Rfl: 0 .  lisinopril (PRINIVIL,ZESTRIL) 20 MG tablet, Take 1 tablet (20 mg total) by mouth daily., Disp: 90 tablet, Rfl: 1 .  pantoprazole (PROTONIX) 40 MG tablet, Take 40 mg by mouth 2 (two) times daily., Disp: , Rfl:   EXAM:  GENERAL: alert, oriented, appears well and in no acute distress  HEENT: atraumatic, conjunttiva clear, no obvious abnormalities on inspection of external nose and ears  NECK: normal movements of the head and neck  LUNGS: on inspection no signs of respiratory distress, breathing rate appears normal, no obvious gross SOB, gasping or wheezing  CV: no obvious cyanosis  PSYCH/NEURO: pleasant and cooperative, no obvious depression or anxiety, speech and thought processing grossly intact  ASSESSMENT AND PLAN:  Discussed the following assessment and plan:   Aortic atherosclerosis (HCC) On lipitor.    Diabetes mellitus a1c significantly elevated.  She documented allergy to actos.  States was told to stop metformin (in hospital).  Only taking low dose of glipizide.  Discussed treatment options. Given level of sugar, discussed insulin.  She was in agreement to be taught how to draw up and administer insulin.  Discussed other possibilities, SGLT2, etc.  Start with insuline to lower sugar and then hopefully can convert to oral medication.     GERD (gastroesophageal reflux disease) Controlled on protonix.    Hypercholesterolemia On lipitor.  Low cholesterol diet and exercise.  Follow lipid panel and liver function tests.   Lab Results  Component Value Date   CHOL 125 07/12/2018   HDL 34.70 (L) 07/12/2018   LDLCALC 58 07/12/2018   LDLDIRECT 71 10/18/2017   TRIG 157.0 (H) 07/12/2018   CHOLHDL 4 07/12/2018    Hypertension Blood pressure appears to be doing better.  Continue current  medication regimen.  Follow pressures.  Follow metabolic panel.    Hypothyroidism On thyroid replacement.  Follow tsh.   Left wrist fracture Wrist fracture s/p fall.  Saw ortho.  Still with persistent pain.  Discussed with need for f/u with ortho.  She is in agreement.      I discussed the assessment and treatment plan with the patient. The patient was provided an opportunity to ask questions and all were answered. The patient agreed with the plan and demonstrated an understanding of the instructions.   The patient was advised to call back or seek an in-person evaluation if the symptoms worsen or if the condition fails to improve as anticipated.   Einar Pheasant, MD

## 2018-07-22 NOTE — Assessment & Plan Note (Signed)
On lipitor

## 2018-07-22 NOTE — Assessment & Plan Note (Signed)
Controlled on protonix.   

## 2018-07-22 NOTE — Assessment & Plan Note (Signed)
On lipitor.  Low cholesterol diet and exercise.  Follow lipid panel and liver function tests.   Lab Results  Component Value Date   CHOL 125 07/12/2018   HDL 34.70 (L) 07/12/2018   LDLCALC 58 07/12/2018   LDLDIRECT 71 10/18/2017   TRIG 157.0 (H) 07/12/2018   CHOLHDL 4 07/12/2018

## 2018-07-22 NOTE — Assessment & Plan Note (Signed)
Wrist fracture s/p fall.  Saw ortho.  Still with persistent pain.  Discussed with need for f/u with ortho.  She is in agreement.

## 2018-07-22 NOTE — Assessment & Plan Note (Signed)
a1c significantly elevated.  She documented allergy to actos.  States was told to stop metformin (in hospital).  Only taking low dose of glipizide.  Discussed treatment options. Given level of sugar, discussed insulin.  She was in agreement to be taught how to draw up and administer insulin.  Discussed other possibilities, SGLT2, etc.  Start with insuline to lower sugar and then hopefully can convert to oral medication.

## 2018-07-22 NOTE — Assessment & Plan Note (Signed)
On thyroid replacement.  Follow tsh.  

## 2018-07-22 NOTE — Assessment & Plan Note (Signed)
Blood pressure appears to be doing better.  Continue current medication regimen.  Follow pressures.  Follow metabolic panel.

## 2018-07-23 ENCOUNTER — Telehealth: Payer: Self-pay | Admitting: Internal Medicine

## 2018-07-23 NOTE — Telephone Encounter (Signed)
-----   Message from De Hollingshead, [facility] sent at 07/19/2018  3:59 PM EDT ----- Regarding: RE: diabetes medication - treatment question Hi!  This is tricky. I do think insulin is often successful in this type of patient, because we aren't worried about GI side effects, urinary side effects, etc. With her CKD, ideally we'd do an SGLT2, but I'm concerned with her current level of elevation that she'd urinate so much sugar that she'd get a yeast infection. Next-to-ideal option is start insulin, titrate to A1c control, then add in SGLT2 to be able to back off on insulin dose needed.   I don't see any prescription insurance on file, so that would determine what we can do. If just Medicare A/B, we could pursue patient assistance. As of right now, my last day with y'all is still 6/29, but since she's [facility] we can continue to support the patient assistance process.   If I do get approved to stay and do CCM, I am more than happy to help follow along with her!  [name] ----- Message ----- From: Einar Pheasant, MD Sent: 07/18/2018   2:57 PM EDT To: De Hollingshead, RPH Subject: diabetes medication - treatment question       Her diabetes has been hard to treat.  She is only on a low dose of glipizide now.  She stopped her metformin. Was told to go off metformin while at the hospital (per pt).  I cannot find the reason.  She is unable to take a higher dose of metformin secondary to intolerance.  Previously had a reaction to actos.  Her a1c's previously had been 7-8 range and she has been hesitant to start new medication and sometimes does not take her medications.  (will just stop them).  She is not watching her diet.  Not exercising.  A1c on recent check 11.  Given the a1c level, I talked with her about insulin.  She is agreeable.  I am going to bring her in and have one of the nurses instruct her on how to give herself insulin.  She was not thrilled about doing this, but did agree.  Do you have any other  suggestions and also would you be ok to follow her along with me?    Thank you for your help  Einar Pheasant

## 2018-07-24 ENCOUNTER — Ambulatory Visit: Payer: Medicare Other

## 2018-07-24 ENCOUNTER — Other Ambulatory Visit: Payer: Medicare Other

## 2018-07-25 ENCOUNTER — Telehealth: Payer: Self-pay | Admitting: Internal Medicine

## 2018-07-25 NOTE — Telephone Encounter (Signed)
-----   Message from De Hollingshead, [facility] sent at 07/24/2018  8:46 PM EDT ----- Regarding: RE: insulin question Agreed, I'd start a long acting [place] Aas, Lantus, Basaglar, whatever is covered) and titrate to goal fastings, then add meal time if post prandials remain elevated.   From what I can tell, it looks like she has a [employer] plan, and when I plug her zip code into the [employer] website, it looks like both available plans cover [place].   All of that to day, I'm fairly confident that Tyler Aas is covered. I'd start with 10 units of Tresiba U100! If you think she can understand and follow a self titration, you could have her increase by 2 units every 3 days until fastings are controlled.   [name] ----- Message ----- From: Einar Pheasant, MD Sent: 07/24/2018   4:32 PM EDT To: De Hollingshead, RPH Subject: insulin question                               Sorry to bother you again.  This is the patient I told you about that her a1c recently increased to 11.  She is going to come in and Tye Maryland is going to teach her to give herself insulin.  I am going to start with insulin and then hopefully can convert her to oral medication.  Regarding insulin, with her insurance, does it matter which insulin to use.  I was just planning to start long acting first and then depending on her response, we may have to use some SSI.  I would appreciate your thoughts.  Thanks.    Einar Pheasant

## 2018-07-25 NOTE — Telephone Encounter (Signed)
Pt notified and is agreeable to come in for instructions on giving herself insulin.   Pt coming in Thursday for nurse visit.  Will start with Tresiba 10 units q am.  Check and record sugars and send in over the next 2 weeks.

## 2018-07-26 ENCOUNTER — Ambulatory Visit (INDEPENDENT_AMBULATORY_CARE_PROVIDER_SITE_OTHER): Payer: Medicare Other | Admitting: *Deleted

## 2018-07-26 ENCOUNTER — Other Ambulatory Visit: Payer: Self-pay

## 2018-07-26 DIAGNOSIS — M19049 Primary osteoarthritis, unspecified hand: Secondary | ICD-10-CM | POA: Diagnosis not present

## 2018-07-26 DIAGNOSIS — E1165 Type 2 diabetes mellitus with hyperglycemia: Secondary | ICD-10-CM

## 2018-07-26 MED ORDER — INSULIN DEGLUDEC 100 UNIT/ML ~~LOC~~ SOPN: 10.0000 [IU] | PEN_INJECTOR | Freq: Every day | SUBCUTANEOUS | 1 refills | Status: DC

## 2018-07-26 NOTE — Telephone Encounter (Signed)
Se nurse visit coordination note patient will record CBG's and bring to  Office.

## 2018-07-26 NOTE — Progress Notes (Addendum)
Patient into office for insulin teaching, technique demonstrated using demonstration pen by nurse, patient did return demonstration with proper technique for nurse. Medication sent to pharmacy per patient choice per verbal by MD.  Reviewed.  Dr Nicki Reaper

## 2018-07-27 ENCOUNTER — Telehealth: Payer: Self-pay | Admitting: Internal Medicine

## 2018-07-27 DIAGNOSIS — E1165 Type 2 diabetes mellitus with hyperglycemia: Secondary | ICD-10-CM

## 2018-07-27 MED ORDER — INSULIN PEN NEEDLE 32G X 6 MM MISC: 1 refills | Status: DC

## 2018-07-27 NOTE — Telephone Encounter (Signed)
In the future , you can do this,  Yo do not need a doctor to do it.

## 2018-07-27 NOTE — Telephone Encounter (Signed)
Patient is calling because she is need of needles. She only got the pens.  Thank you CB- [phone]

## 2018-07-27 NOTE — Telephone Encounter (Signed)
Could you call needles in for this patient?  She said the [place] FlexTouch pen was sent in but not an initial Rx for needles.    Pt request Rx to be sent to pharmacy that's in chart:  [facility], [place] [phone] (Phone) [phone] (Fax)

## 2018-07-28 ENCOUNTER — Telehealth: Payer: Self-pay | Admitting: *Deleted

## 2018-07-28 NOTE — Telephone Encounter (Signed)
Patient has been notified that lung cancer screening CT scan is due currently or will be in near future. Confirmed that patient is within the appropriate age range, and asymptomatic, (no signs or symptoms of lung cancer). Patient denies illness that would prevent curative treatment for lung cancer if found. Verified smoking history; current smoker, 1 pack a day. Patient is agreeable for CT scan being scheduled.

## 2018-07-31 ENCOUNTER — Encounter: Payer: Self-pay | Admitting: Internal Medicine

## 2018-08-01 ENCOUNTER — Other Ambulatory Visit: Payer: Self-pay | Admitting: *Deleted

## 2018-08-01 DIAGNOSIS — Z122 Encounter for screening for malignant neoplasm of respiratory organs: Secondary | ICD-10-CM

## 2018-08-01 DIAGNOSIS — Z87891 Personal history of nicotine dependence: Secondary | ICD-10-CM

## 2018-08-06 ENCOUNTER — Other Ambulatory Visit: Payer: Self-pay | Admitting: Internal Medicine

## 2018-08-06 MED ORDER — GLUCOSE BLOOD VI STRP: ORAL_STRIP | 12 refills | Status: DC

## 2018-08-06 NOTE — Telephone Encounter (Signed)
Refill sent.

## 2018-08-06 NOTE — Telephone Encounter (Signed)
Medication: glucose blood test strip / Contour brand / Pt is low on strips  Has the patient contacted their pharmacy? Yes.   (Agent: If no, request that the patient contact the pharmacy for the refill.) (Agent: If yes, when and what did the pharmacy advise?)  Preferred Pharmacy (with phone number or street name): [place], [place] - [address] AT [facility] [phone] (Phone) [phone] (Fax)  Agent: Please be advised that RX refills may take up to 3 business days. We ask that you follow-up with your pharmacy.

## 2018-08-09 ENCOUNTER — Ambulatory Visit: Payer: Medicare Other

## 2018-08-13 ENCOUNTER — Ambulatory Visit: Admission: RE | Admit: 2018-08-13 | Payer: Medicare Other | Source: Ambulatory Visit

## 2018-08-13 ENCOUNTER — Ambulatory Visit
Admission: RE | Admit: 2018-08-13 | Discharge: 2018-08-13 | Disposition: A | Discharge status: Home / Self Care | Payer: Medicare Other | Source: Ambulatory Visit | Attending: Oncology | Admitting: Oncology

## 2018-08-13 ENCOUNTER — Other Ambulatory Visit: Payer: Self-pay

## 2018-08-13 DIAGNOSIS — Z87891 Personal history of nicotine dependence: Secondary | ICD-10-CM | POA: Insufficient documentation

## 2018-08-13 DIAGNOSIS — Z122 Encounter for screening for malignant neoplasm of respiratory organs: Secondary | ICD-10-CM | POA: Diagnosis not present

## 2018-08-13 DIAGNOSIS — F17210 Nicotine dependence, cigarettes, uncomplicated: Secondary | ICD-10-CM | POA: Diagnosis not present

## 2018-08-15 ENCOUNTER — Encounter: Payer: Self-pay | Admitting: *Deleted

## 2018-09-10 ENCOUNTER — Ambulatory Visit
Admission: RE | Admit: 2018-09-10 | Discharge: 2018-09-10 | Disposition: A | Discharge status: Home / Self Care | Payer: Medicare Other | Source: Ambulatory Visit | Attending: Internal Medicine | Admitting: Internal Medicine

## 2018-09-10 ENCOUNTER — Other Ambulatory Visit: Payer: Self-pay

## 2018-09-10 DIAGNOSIS — Z1231 Encounter for screening mammogram for malignant neoplasm of breast: Secondary | ICD-10-CM | POA: Insufficient documentation

## 2018-09-11 ENCOUNTER — Other Ambulatory Visit: Payer: Self-pay

## 2018-09-11 MED ORDER — LEVOTHYROXINE SODIUM 100 MCG PO TABS: 100.0000 ug | ORAL_TABLET | Freq: Every day | ORAL | 0 refills | Status: DC

## 2018-09-14 ENCOUNTER — Ambulatory Visit (INDEPENDENT_AMBULATORY_CARE_PROVIDER_SITE_OTHER): Payer: Medicare Other | Admitting: Internal Medicine

## 2018-09-14 ENCOUNTER — Other Ambulatory Visit: Payer: Self-pay

## 2018-09-14 DIAGNOSIS — S62102D Fracture of unspecified carpal bone, left wrist, subsequent encounter for fracture with routine healing: Secondary | ICD-10-CM

## 2018-09-14 DIAGNOSIS — I700 Atherosclerosis of aorta: Secondary | ICD-10-CM

## 2018-09-14 DIAGNOSIS — E039 Hypothyroidism, unspecified: Secondary | ICD-10-CM

## 2018-09-14 DIAGNOSIS — I2510 Atherosclerotic heart disease of native coronary artery without angina pectoris: Secondary | ICD-10-CM | POA: Diagnosis not present

## 2018-09-14 DIAGNOSIS — E7800 Pure hypercholesterolemia, unspecified: Secondary | ICD-10-CM

## 2018-09-14 DIAGNOSIS — I10 Essential (primary) hypertension: Secondary | ICD-10-CM | POA: Diagnosis not present

## 2018-09-14 DIAGNOSIS — K2100 Gastro-esophageal reflux disease with esophagitis, without bleeding: Secondary | ICD-10-CM

## 2018-09-14 DIAGNOSIS — E1165 Type 2 diabetes mellitus with hyperglycemia: Secondary | ICD-10-CM | POA: Diagnosis not present

## 2018-09-14 LAB — HM DIABETES FOOT EXAM

## 2018-09-14 NOTE — Progress Notes (Addendum)
Subjective:    Patient ID: Ann Collins, female    DOB: [birth year], 73 y.o.   MRN: [id number]  HPI  Patient here for a scheduled follow up.  Reports she is doing relatively well.  she is still having pain in her left wrist.  S/p fracture.  Seeing ortho.  Discussed the need to f/u with ortho. She was recently started on insulin.  a1c 11.  Discussed with her today. She brought in no sugar readings.  Discussed low carb diet and exercise.  States am sugars are averaging 140-150.  PM sugars 180s. Discussed increasing her 10 units to 12 units and tittrating up.  Would like to be able to transition her over to oral medication, once sugars down.  Discussed treatment options.  No chest pain.  No sob.  No acid reflux.  No abdominal pain.  Handling stress.  Bowels stable.  Blood pressure doing better.      Past Medical History:  Diagnosis Date   Abdominal hernia    CAD (coronary artery disease)    a. LHC 7/19: LM nl, p-mLAD 100% w/ right to left collaterals, OM2 90% (2 mm), 1st LPL 40%, medical management   Diabetes mellitus ([place])    Diverticulosis    GERD (gastroesophageal reflux disease)    Hematuria    Hypercholesterolemia    Hypertension    Hypothyroidism    Kidney stones    Past Surgical History:  Procedure Laterality Date   APPENDECTOMY     BREAST BIOPSY  1969   right   CHOLECYSTECTOMY  2005   COLONOSCOPY     COLONOSCOPY WITH PROPOFOL N/A 09/19/2017   Procedure: COLONOSCOPY WITH PROPOFOL;  Surgeon: Toledo, Benay Pike, MD;  Location: [facility] ENDOSCOPY;  Service: Gastroenterology;  Laterality: N/A;   ESOPHAGOGASTRODUODENOSCOPY (EGD) WITH PROPOFOL N/A 09/19/2017   Procedure: ESOPHAGOGASTRODUODENOSCOPY (EGD) WITH PROPOFOL;  Surgeon: Toledo, Benay Pike, MD;  Location: [facility] ENDOSCOPY;  Service: Gastroenterology;  Laterality: N/A;   LEFT HEART CATH AND CORONARY ANGIOGRAPHY Left 08/07/2017   Procedure: LEFT HEART CATH AND CORONARY ANGIOGRAPHY;  Surgeon: Wellington Hampshire, MD;   Location: [place] [facility];  Service: Cardiovascular;  Laterality: Left;   UPPER GI ENDOSCOPY     Family History  Problem Relation Age of Onset   Brain cancer Mother        history of brain tumor   Lung disease Father    Diabetes Brother        x2   Bipolar disorder Son    Alcohol abuse Son    Breast cancer Neg Hx    Colon cancer Neg Hx    Social History   Socioeconomic History   Marital status: Married    Spouse name: Not on file   Number of children: Not on file   Years of education: Not on file   Highest education level: Not on file  Occupational History   Not on file  Social Needs   Financial resource strain: Not hard at all   Food insecurity    Worry: Never true    Inability: Never true   Transportation needs    Medical: No    Non-medical: No  Tobacco Use   Smoking status: Current Every Day Smoker    Packs/day: 1.00    Years: 50.00    Pack years: 50.00   Smokeless tobacco: Never Used  Substance and Sexual Activity   Alcohol use: No    Alcohol/week: 0.0 standard drinks   Drug use: No  Sexual activity: Not on file  Lifestyle   Physical activity    Days per week: 2 days    Minutes per session: 20 min   Stress: Not at all  Relationships   Social connections    Talks on phone: Not on file    Gets together: Not on file    Attends religious service: Not on file    Active member of club or organization: Not on file    Attends meetings of clubs or organizations: Not on file    Relationship status: Not on file  Other Topics Concern   Not on file  Social History Narrative   Not on file    Outpatient Encounter Medications as of 09/14/2018  Medication Sig   aspirin 81 MG tablet Take 81 mg by mouth daily.   atorvastatin (LIPITOR) 40 MG tablet Take 1 tablet (40 mg total) by mouth daily.   carvedilol (COREG) 3.125 MG tablet Take 1 tablet (3.125 mg total) by mouth 2 (two) times daily.   Cyanocobalamin 1500 MCG TBDP Take 1,500  mcg by mouth daily.   glipiZIDE (GLUCOTROL XL) 2.5 MG 24 hr tablet TAKE 1 TABLET BY MOUTH ONCE DAILY BEFORE BREAKFAST   glucose blood test strip Use as instructed to blood sugar twice daily   insulin degludec (TRESIBA) 100 UNIT/ML SOPN FlexTouch Pen Inject 0.1 mLs (10 Units total) into the skin daily. In the morning   Insulin Pen Needle 32G X 6 MM MISC To fit TRESIBA PEN   levothyroxine (SYNTHROID) 100 MCG tablet Take 1 tablet (100 mcg total) by mouth daily.   pantoprazole (PROTONIX) 40 MG tablet Take 40 mg by mouth 2 (two) times daily.   lisinopril (PRINIVIL,ZESTRIL) 20 MG tablet Take 1 tablet (20 mg total) by mouth daily.   No facility-administered encounter medications on file as of 09/14/2018.     Review of Systems     Objective:    Physical Exam  BP 122/68    Pulse (!) 58    Temp 98.4 F (36.9 C) (Oral)    Resp 16    Wt 145 lb (65.8 kg)    LMP 03/10/1991    SpO2 98%    BMI 26.52 kg/m  Wt Readings from Last 3 Encounters:  09/14/18 145 lb (65.8 kg)  08/13/18 148 lb (67.1 kg)  04/19/18 144 lb 4 oz (65.4 kg)     Lab Results  Component Value Date   WBC 8.0 07/12/2018   HGB 13.4 07/12/2018   HCT 40.3 07/12/2018   PLT 142.0 (L) 07/12/2018   GLUCOSE 214 (H) 07/12/2018   CHOL 125 07/12/2018   TRIG 157.0 (H) 07/12/2018   HDL 34.70 (L) 07/12/2018   LDLDIRECT 71 10/18/2017   LDLCALC 58 07/12/2018   ALT 13 07/12/2018   AST 11 07/12/2018   NA 141 07/12/2018   K 4.0 07/12/2018   CL 106 07/12/2018   CREATININE 0.94 07/12/2018   BUN 12 07/12/2018   CO2 25 07/12/2018   TSH 1.52 07/12/2018   INR 0.87 08/07/2017   HGBA1C 11.2 (H) 07/12/2018   MICROALBUR <0.7 07/12/2018    Mm 3d Screen Breast Bilateral  Result Date: 09/10/2018 CLINICAL DATA:  Screening. EXAM: DIGITAL SCREENING BILATERAL MAMMOGRAM WITH TOMO AND CAD COMPARISON:  Previous exam(s). ACR Breast Density Category b: There are scattered areas of fibroglandular density. FINDINGS: There are no findings suspicious  for malignancy. Images were processed with CAD. IMPRESSION: No mammographic evidence of malignancy. A result letter of this screening mammogram  will be mailed directly to the patient. RECOMMENDATION: Screening mammogram in one year. (Code:SM-B-01Y) BI-RADS CATEGORY  1: Negative. Electronically Signed   By: Lillia Mountain M.D.   On: 09/10/2018 13:48       Assessment & Plan:   Problem List Items Addressed This Visit    Aortic atherosclerosis ([place])    On lipitor.        Diabetes mellitus (HCC)    Recent a1c 11.2.  Given significant elevation in her sugar, she was started on tresiba.  Taking 10 units per day.  Did not bring in sugar readings.  Sugars as outlined. Discussed increasing tresiba to 12 units - with plans to titrate up.  Hopefully can transition over to oral medications.  Discussed low carb diet and exercise.  Discussed referral for chronic care management with [name]. She was in agreement.        Relevant Orders   Ambulatory referral to Chronic Care Management Services   Hemoglobin A1c   GERD (gastroesophageal reflux disease)    Controlled on protonix.  Follow.        Hypercholesterolemia    Low cholesterol diet and exercise.  Follow lipid panel and liver function tests.        Relevant Orders   Hepatic function panel   Lipid panel   Hypertension    Blood pressure under good control.  Continue same medication regimen.  Follow pressures.  Follow metabolic panel.        Relevant Orders   CBC with Differential/Platelet   Basic metabolic panel   Hypothyroidism    On thyroid replacement.  Follow tsh.        Left wrist fracture    Has seen ortho.  Still with increased pain. Discussed the need to f/u with ortho.            Einar Pheasant, MD

## 2018-09-16 ENCOUNTER — Encounter: Payer: Self-pay | Admitting: Internal Medicine

## 2018-09-16 NOTE — Assessment & Plan Note (Signed)
On thyroid replacement.  Follow tsh.  

## 2018-09-16 NOTE — Assessment & Plan Note (Signed)
Blood pressure under good control.  Continue same medication regimen.  Follow pressures.  Follow metabolic panel.   

## 2018-09-16 NOTE — Assessment & Plan Note (Signed)
Recent a1c 11.2.  Given significant elevation in her sugar, she was started on tresiba.  Taking 10 units per day.  Did not bring in sugar readings.  Sugars as outlined. Discussed increasing tresiba to 12 units - with plans to titrate up.  Hopefully can transition over to oral medications.  Discussed low carb diet and exercise.  Discussed referral for chronic care management with [name]. She was in agreement.

## 2018-09-16 NOTE — Assessment & Plan Note (Signed)
On lipitor

## 2018-09-16 NOTE — Assessment & Plan Note (Signed)
Low cholesterol diet and exercise.  Follow lipid panel and liver function tests.  

## 2018-09-16 NOTE — Assessment & Plan Note (Signed)
Controlled on protonix.  Follow.   

## 2018-09-16 NOTE — Assessment & Plan Note (Signed)
Has seen ortho.  Still with increased pain. Discussed the need to f/u with ortho.

## 2018-09-17 ENCOUNTER — Ambulatory Visit: Payer: Self-pay | Admitting: Pharmacist

## 2018-09-17 NOTE — Progress Notes (Signed)
Reviewed.  Agree with plan   Dr Ivone Licht 

## 2018-09-17 NOTE — Chronic Care Management (AMB) (Signed)
  Chronic Care Management   Note  09/17/2018 Name: [name] MRN: [id number] DOB: 15-Dec-1945  Ann Collins is a 73 y.o. year old female who is a primary care patient of Einar Pheasant, MD. The CCM team was consulted for assistance with chronic disease management and care coordination needs.    Attempted to call patient to set up appointment; left HIPAA compliant message to return my call at her convenience.   Follow up plan: - If I do not hear back, will outreach again in 2-4 business days  [name], PharmD, [place] Pharmacist [facility] [phone]

## 2018-09-18 DIAGNOSIS — Z20828 Contact with and (suspected) exposure to other viral communicable diseases: Secondary | ICD-10-CM | POA: Diagnosis not present

## 2018-09-20 ENCOUNTER — Ambulatory Visit: Payer: Medicare Other | Admitting: Pharmacist

## 2018-09-20 DIAGNOSIS — E1165 Type 2 diabetes mellitus with hyperglycemia: Secondary | ICD-10-CM

## 2018-09-20 DIAGNOSIS — I700 Atherosclerosis of aorta: Secondary | ICD-10-CM

## 2018-09-20 DIAGNOSIS — I10 Essential (primary) hypertension: Secondary | ICD-10-CM

## 2018-09-20 MED ORDER — INSULIN DEGLUDEC 100 UNIT/ML ~~LOC~~ SOPN: 14.0000 [IU] | PEN_INJECTOR | Freq: Every day | SUBCUTANEOUS | 1 refills | Status: DC

## 2018-09-20 MED ORDER — JARDIANCE 10 MG PO TABS: 10.0000 mg | ORAL_TABLET | Freq: Every day | ORAL | 2 refills | Status: DC

## 2018-09-20 NOTE — Progress Notes (Signed)
Reviewed above information.  Agree with assessment and plan.    Dr Uriah Philipson 

## 2018-09-20 NOTE — Chronic Care Management (AMB) (Signed)
Chronic Care Management   Note  09/20/2018 Name: Ann Collins MRN: [id number] DOB: 02/12/1945   Subjective:  Ann Collins is a 73 y.o. year old female who is a primary care patient of Einar Pheasant, MD. The CCM team was consulted for assistance with chronic disease management and care coordination needs.    Spoke with patient telephonically for medication management support.    Ms. Mccrory was given information about Chronic Care Management services today including:  1. CCM service includes personalized support from designated clinical staff supervised by her physician, including individualized plan of care and coordination with other care providers 2. 24/7 contact phone numbers for assistance for urgent and routine care needs. 3. Service will only be billed when office clinical staff spend 20 minutes or more in a month to coordinate care. 4. Only one practitioner may furnish and bill the service in a calendar month. 5. The patient may stop CCM services at any time (effective at the end of the month) by phone call to the office staff. 6. The patient will be responsible for cost sharing (co-pay) of up to 20% of the service fee (after annual deductible is met).  Patient agreed to services and verbal consent obtained.   Review of patient status, including review of consultants reports, laboratory and other test data, was performed as part of comprehensive evaluation and provision of chronic care management services.   Objective:  Lab Results  Component Value Date   CREATININE 0.94 07/12/2018   CREATININE 1.05 (H) 08/22/2017   CREATININE 1.00 08/03/2017    Lab Results  Component Value Date   HGBA1C 11.2 (H) 07/12/2018       Component Value Date/Time   CHOL 125 07/12/2018 0818   CHOL 134 10/18/2017 1447   TRIG 157.0 (H) 07/12/2018 0818   HDL 34.70 (L) 07/12/2018 0818   HDL 41 10/18/2017 1447   CHOLHDL 4 07/12/2018 0818   VLDL 31.4 07/12/2018 0818   LDLCALC 58  07/12/2018 0818   LDLCALC 63 10/18/2017 1447   LDLDIRECT 71 10/18/2017 1447   LDLDIRECT 156.0 08/29/2016 1523    Clinical ASCVD: Yes - ischemic changes seen on LHC   BP Readings from Last 3 Encounters:  09/14/18 122/68  07/22/18 134/78  04/26/18 (!) 144/80    Allergies  Allergen Reactions   Pioglitazone Swelling   Macrobid [Nitrofurantoin Macrocrystal] Other (See Comments)    Body aches   Nitrofurantoin Other (See Comments)    Body aches    Medications Reviewed Today    Reviewed by De Hollingshead, [facility] (Pharmacist) on 09/20/18 at 1105  Med List Status: <None>  Medication Order Taking? Sig Documenting Provider Last Dose Status Informant  aspirin 81 MG tablet [id number] Yes Take 81 mg by mouth daily. [provider] Taking Active   atorvastatin (LIPITOR) 40 MG tablet [id number] Yes Take 1 tablet (40 mg total) by mouth daily. Rise Mu, PA-C Taking Active   carvedilol (COREG) 3.125 MG tablet [id number] Yes Take 1 tablet (3.125 mg total) by mouth 2 (two) times daily. Rise Mu, PA-C Taking Active   Cyanocobalamin 1500 MCG TBDP [id number] Yes Take 1,500 mcg by mouth daily. [provider] Taking Active Self  Glucosamine 500 MG CAPS [id number] Yes Take 1 capsule by mouth as needed. [provider] Taking Active            Med Note Darnelle Maffucci, Arville Lime   Thu Sep 20, 2018 11:05 AM) Taking for wrist pain  glucose blood  test strip [id number] Yes Use as instructed to blood sugar twice daily Einar Pheasant, MD Taking Active   insulin degludec (TRESIBA) 100 UNIT/ML SOPN FlexTouch Pen [id number] Yes Inject 0.1 mLs (10 Units total) into the skin daily. In the morning  Patient taking differently: Inject 12 Units into the skin daily. In the morning   Einar Pheasant, MD Taking Active   Insulin Pen Needle 32G X 6 MM MISC [id number] Yes To fit TRESIBA PEN Crecencio Mc, MD Taking Active   levothyroxine (SYNTHROID) 100 MCG tablet [id number] Yes Take 1 tablet (100  mcg total) by mouth daily. Einar Pheasant, MD Taking Active   lisinopril (PRINIVIL,ZESTRIL) 20 MG tablet [id number] Yes Take 1 tablet (20 mg total) by mouth daily. Wellington Hampshire, MD Taking Active   pantoprazole (PROTONIX) 40 MG tablet [id number] Yes Take 40 mg by mouth as needed.  [provider] Taking Active            Med Note Darnelle Maffucci, Maralyn Sago Sep 20, 2018 11:04 AM)             Assessment:   Goals Addressed            This Visit's Progress     Patient Stated    "I need to get my blood sugar better controlled" (pt-stated)       Current Barriers:   Diabetes: uncontrolled though improved per SMBG; most recent A1c 11.2%  o Reports that dose increase from 10 to 12 units of Tresiba did not seem to have a significant impact on sugar control o Notes that she stopped metformin back in summer of 2019 because "they told me to stop it at the hospital". No documented reason why, metformin was removed from her medication list in 07/2017 d/t patient not taking.  Current antihyperglycemic regimen: Tresiba 12 units daily;   Denies hypoglycemic symptoms.   Current meal patterns: o Breakfast: Cereal; sausage + eggs; 1 piece wheat toast;  o Lunch: usually leftovers; meat loaf, beans, slaw; loves potatoes  o Supper: Typically cooks supper  o Snacks: doesn't typically snack, sometimes has  o Drinks: Diet coke (at least 6 per day); sugar free Nature's Twist (but diluted);   Current exercise: limited by pandemic; was previously working out 3 days per week  Current blood glucose readings: checking BID-TID; generally 160-180s   Cardiovascular risk reduction: o Current hypertensive regimen: lisinopril 20 mg daily, carvedilol 3.125 mg BID; reports home BP 110-130/60-70s;  o Current hyperlipidemia regimen: atorvastatin 40 mg daily; last LDL at goal <70; notes that she is taking ASA 325 mg per Dr. Fletcher Anon, though documentation shows ASA 81 mg ordered   Pharmacist Clinical  Goal(s):   Over the next 90 days, [name] and primary care provider to address optimized glycemic management  Interventions:  Comprehensive medication review performed, medication list updated in electronic medical record  Reviewed medication history; no reason for metformin discontinuation was found. Will re-evaluate moving forward.   Continued blood sugar elevations; will increase Tresiba to 14 units once daily. Patient verbalizes understanding. She expresses cost concerns with brand name medications. Reviewed income; she will qualify for Novo assistance. Explained process, patient will come by clinic to sign application and provide proof of income. Will collaborate with Dr. Nicki Reaper on provider portion  D/t CKD and cardiovascular hx, SGLT2 therapy appropriate. Patient interested in starting therapy to hopefully reduce insulin burden moving forward. Will start Jardiance 10 mg daily, also applying for patient  assistance to relieve financial burden.   Will address tobacco cessation moving forward.   Recommend reduction to aspirin 81 mg daily. Patient verbalized understanding  Patient Self Care Activities:   Patient will check blood glucose BID-TID , document, and provide at future appointments  Patient will take medications as prescribed  Patient will contact provider with any episodes of hypoglycemia  Patient will report any questions or concerns to provider   Patient will collaborate on completion of patient assistance application  Initial goal documentation        Plan: - Will collaborate with patient and provider on patient assistance applications as above. Will pass materials along to [employer], CPhT for follow up.  - Will outreach patient in ~4-5 weeks for continued medication management support   [name], PharmD, [place] Pharmacist [place] [phone]

## 2018-09-20 NOTE — Patient Instructions (Signed)
Visit Information  Goals Addressed            This Visit's Progress     Patient Stated   . "I need to get my blood sugar better controlled" (pt-stated)       Current Barriers:  . Diabetes: uncontrolled though improved per SMBG; most recent A1c 11.2%  o Reports that dose increase from 10 to 12 units of Tresiba did not seem to have a significant impact on sugar control o Notes that she stopped metformin back in summer of 2019 because "they told me to stop it at the hospital". No documented reason why, metformin was removed from her medication list in 07/2017 d/t patient not taking. . Current antihyperglycemic regimen: Tresiba 12 units daily;  . Denies hypoglycemic symptoms.  . Current meal patterns: o Breakfast: Cereal; sausage + eggs; 1 piece wheat toast;  o Lunch: usually leftovers; meat loaf, beans, slaw; loves potatoes  o Supper: Typically cooks supper  o Snacks: doesn't typically snack, sometimes has  o Drinks: Diet coke (at least 6 per day); sugar free Nature's Twist (but diluted);  Marland Kitchen Current exercise: limited by pandemic; was previously working out 3 days per week . Current blood glucose readings: checking BID-TID; generally 160-180s  . Cardiovascular risk reduction: o Current hypertensive regimen: lisinopril 20 mg daily, carvedilol 3.125 mg BID; reports home BP 110-130/60-70s;  o Current hyperlipidemia regimen: atorvastatin 40 mg daily; last LDL at goal <70; notes that she is taking ASA 325 mg per Dr. Fletcher Anon, though documentation shows ASA 81 mg ordered   Pharmacist Clinical Goal(s):  Marland Kitchen Over the next 90 days, [name] and primary care provider to address optimized glycemic management  Interventions: . Comprehensive medication review performed, medication list updated in electronic medical record . Reviewed medication history; no reason for metformin discontinuation was found. Will re-evaluate moving forward.  . Continued blood sugar elevations; will  increase Tresiba to 14 units once daily. Patient verbalizes understanding. She expresses cost concerns with brand name medications. Reviewed income; she will qualify for Novo assistance. Explained process, patient will come by clinic to sign application and provide proof of income. Will collaborate with Dr. Nicki Reaper on provider portion . D/t CKD and cardiovascular hx, SGLT2 therapy appropriate. Patient interested in starting therapy to hopefully reduce insulin burden moving forward. Will start Jardiance 10 mg daily, also applying for patient assistance to relieve financial burden.  . Will address tobacco cessation moving forward.  . Recommend reduction to aspirin 81 mg daily. Patient verbalized understanding  Patient Self Care Activities:  . Patient will check blood glucose BID-TID , document, and provide at future appointments . Patient will take medications as prescribed . Patient will contact provider with any episodes of hypoglycemia . Patient will report any questions or concerns to provider  . Patient will collaborate on completion of patient assistance application  Initial goal documentation        Ms. Tsukamoto was given information about Chronic Care Management services today including:  1. CCM service includes personalized support from designated clinical staff supervised by her physician, including individualized plan of care and coordination with other care providers 2. 24/7 contact phone numbers for assistance for urgent and routine care needs. 3. Service will only be billed when office clinical staff spend 20 minutes or more in a month to coordinate care. 4. Only one practitioner may furnish and bill the service in a calendar month. 5. The patient may stop CCM services at any time (effective  at the end of the month) by phone call to the office staff. 6. The patient will be responsible for cost sharing (co-pay) of up to 20% of the service fee (after annual deductible is  met).  Patient agreed to services and verbal consent obtained.   The patient verbalized understanding of instructions provided today and declined a print copy of patient instruction materials.   Plan: - Will collaborate with patient and provider on patient assistance applications as above. Will pass materials along to Jill Simcox, CPhT for follow up.  - Will outreach patient in ~4-5 weeks for continued medication management support   [name] Travis, PharmD, CPP Clinical Pharmacist [facility] [phone]   

## 2018-09-26 ENCOUNTER — Telehealth: Payer: Self-pay | Admitting: Internal Medicine

## 2018-09-26 NOTE — Telephone Encounter (Signed)
Sugar readings in results folder for your review

## 2018-09-26 NOTE — Telephone Encounter (Signed)
Pt dropped off sugar readings. Paper is up front in color folder.

## 2018-09-26 NOTE — Telephone Encounter (Signed)
Sugars appear to be improving overall.  Continue to monitor.  Continue diet and exercise.  Send in readings.

## 2018-09-27 ENCOUNTER — Encounter: Payer: Self-pay | Admitting: Pharmacist

## 2018-09-27 ENCOUNTER — Ambulatory Visit: Payer: Self-pay | Admitting: Pharmacist

## 2018-09-27 DIAGNOSIS — E1165 Type 2 diabetes mellitus with hyperglycemia: Secondary | ICD-10-CM

## 2018-09-27 NOTE — Progress Notes (Signed)
Patient ID: Ann Collins, female   DOB: 13-Jan-1946, 73 y.o.   MRN: [id number] Reviewed above information.  Agree with plan.    Dr Nicki Reaper

## 2018-09-27 NOTE — Patient Instructions (Addendum)
Visit Information  Goals Addressed            This Visit's Progress     Patient Stated   . "I need to get my blood sugar better controlled" (pt-stated)       Current Barriers:  . Diabetes: uncontrolled though improved per SMBG; most recent A1c 11.2%  o Reports that dose increase from 10 to 12 units of Tresiba did not seem to have a significant impact on sugar control o Notes that she stopped metformin back in summer of 2019 because "they told me to stop it at the hospital". No documented reason why, metformin was removed from her medication list in 07/2017 d/t patient not taking. . Current antihyperglycemic regimen: Tresiba 12 units daily;  . Denies hypoglycemic symptoms.  . Current meal patterns: o Breakfast: Cereal; sausage + eggs; 1 piece wheat toast;  o Lunch: usually leftovers; meat loaf, beans, slaw; loves potatoes  o Supper: Typically cooks supper  o Snacks: doesn't typically snack, sometimes has  o Drinks: Diet coke (at least 6 per day); sugar free Nature's Twist (but diluted);  Marland Kitchen Current exercise: limited by pandemic; was previously working out 3 days per week . Current blood glucose readings: checking BID-TID Date Fasting After Lunch After Supper Bedtime  10-Aug 159     11-Aug 161     12-Aug 151 161    13-Aug 162     14-Aug 151 191 190   15-Aug 156  190   16-Aug 133   272  17-Aug 163 270    18-Aug 149  260 138  19-Aug 142 193    Avg 153 203 213 205   . Cardiovascular risk reduction: o Current hypertensive regimen: lisinopril 20 mg daily, carvedilol 3.125 mg BID; reports home BP 110-130/60-70s;  o Current hyperlipidemia regimen: atorvastatin 40 mg daily; last LDL at goal <70;   Pharmacist Clinical Goal(s):  Marland Kitchen Over the next 90 days, [name] and primary care provider to address optimized glycemic management  Interventions: . Received income information and patient/provider signatures for [employer] for [place] for  [employer]. Submitted applications.   Patient Self Care Activities:  . Patient will check blood glucose BID-TID , document, and provide at future appointments . Patient will take medications as prescribed . Patient will contact provider with any episodes of hypoglycemia . Patient will report any questions or concerns to provider  . Patient will collaborate on completion of patient assistance application  Please see past updates related to this goal by clicking on the "Past Updates" button in the selected goal         The patient verbalized understanding of instructions provided today and declined a print copy of patient instruction materials.   Plan:  - Will pass materials along to [employer], CPhT for follow up   [place], PharmD, [place] Pharmacist [place] [phone]

## 2018-09-27 NOTE — Telephone Encounter (Signed)
Spoke with patient, relayed the below information.

## 2018-09-27 NOTE — Chronic Care Management (AMB) (Addendum)
Chronic Care Management   Follow Up Note   09/27/2018 Name: Ann Collins MRN: [id number] DOB: 07-Nov-1945  Referred by: Einar Pheasant, MD Reason for referral : Chronic Care Management (Medication Management)   Ann Collins is a 73 y.o. year old female who is a primary care patient of Einar Pheasant, MD. The CCM team was consulted for assistance with chronic disease management and care coordination needs.    Contacted patient to discuss medication access  Review of patient status, including review of consultants reports, relevant laboratory and other test results, and collaboration with appropriate care team members and the patient's provider was performed as part of comprehensive patient evaluation and provision of chronic care management services.    SDOH (Social Determinants of Health) screening performed today: Financial Strain . See Care Plan for related entries.   Outpatient Encounter Medications as of 09/27/2018  Medication Sig Note  . aspirin 81 MG tablet Take 81 mg by mouth daily.   Marland Kitchen atorvastatin (LIPITOR) 40 MG tablet Take 1 tablet (40 mg total) by mouth daily.   . carvedilol (COREG) 3.125 MG tablet Take 1 tablet (3.125 mg total) by mouth 2 (two) times daily.   . Cyanocobalamin 1500 MCG TBDP Take 1,500 mcg by mouth daily.   . empagliflozin (JARDIANCE) 10 MG TABS tablet Take 10 mg by mouth daily before breakfast.   . Glucosamine 500 MG CAPS Take 1 capsule by mouth as needed. 09/20/2018: Taking for wrist pain  . glucose blood test strip Use as instructed to blood sugar twice daily   . insulin degludec (TRESIBA) 100 UNIT/ML SOPN FlexTouch Pen Inject 0.14 mLs (14 Units total) into the skin daily.   . Insulin Pen Needle 32G X 6 MM MISC To fit TRESIBA PEN   . levothyroxine (SYNTHROID) 100 MCG tablet Take 1 tablet (100 mcg total) by mouth daily.   Marland Kitchen lisinopril (PRINIVIL,ZESTRIL) 20 MG tablet Take 1 tablet (20 mg total) by mouth daily.   . pantoprazole (PROTONIX) 40 MG tablet  Take 40 mg by mouth as needed.     No facility-administered encounter medications on file as of 09/27/2018.      Goals Addressed            This Visit's Progress     Patient Stated   . "I need to get my blood sugar better controlled" (pt-stated)       Current Barriers:  . Diabetes: uncontrolled though improved per SMBG; most recent A1c 11.2%  o Reports that dose increase from 10 to 12 units of Tresiba did not seem to have a significant impact on sugar control o Notes that she stopped metformin back in summer of 2019 because "they told me to stop it at the hospital". No documented reason why, metformin was removed from her medication list in 07/2017 d/t patient not taking. . Current antihyperglycemic regimen: Tresiba 12 units daily;  . Denies hypoglycemic symptoms.  . Current meal patterns: o Breakfast: Cereal; sausage + eggs; 1 piece wheat toast;  o Lunch: usually leftovers; meat loaf, beans, slaw; loves potatoes  o Supper: Typically cooks supper  o Snacks: doesn't typically snack, sometimes has  o Drinks: Diet coke (at least 6 per day); sugar free Nature's Twist (but diluted);  Marland Kitchen Current exercise: limited by pandemic; was previously working out 3 days per week . Current blood glucose readings: checking BID-TID Date Fasting After Lunch After Supper Bedtime  10-Aug 159     11-Aug 161     12-Aug 151  161    13-Aug 162     14-Aug 151 191 190   15-Aug 156  190   16-Aug 133   272  17-Aug 163 270    18-Aug 149  260 138  19-Aug 142 193    Avg 153 203 213 205   . Cardiovascular risk reduction: o Current hypertensive regimen: lisinopril 20 mg daily, carvedilol 3.125 mg BID; reports home BP 110-130/60-70s;  o Current hyperlipidemia regimen: atorvastatin 40 mg daily; last LDL at goal <70;   Pharmacist Clinical Goal(s):  Marland Kitchen Over the next 90 days, [name] and primary care provider to address optimized glycemic management  Interventions: . Received income  information and patient/provider signatures for [employer] for [place] for [employer]. Submitted applications.   Patient Self Care Activities:  . Patient will check blood glucose BID-TID , document, and provide at future appointments . Patient will take medications as prescribed . Patient will contact provider with any episodes of hypoglycemia . Patient will report any questions or concerns to provider  . Patient will collaborate on completion of patient assistance application  Please see past updates related to this goal by clicking on the "Past Updates" button in the selected goal          Plan:  - Will pass materials along to [employer], CPhT for follow up   [place], PharmD, [place] Pharmacist [employer] [phone]

## 2018-10-04 ENCOUNTER — Other Ambulatory Visit: Payer: Self-pay | Admitting: Pharmacy Technician

## 2018-10-04 NOTE — Patient Outreach (Signed)
[place] [facility]) Care Management  10/04/2018  Ann Collins 12-15-1945 [id number]    Successful outreach call placed to [employer] in regards to patient's application for Tresiba.  Spoke to Ann Collins who informed patient had been APPROVED 10/02/2018-01/07/2019. The patient ID number is [id number]. She informed patient would receive 2 boxes or 4 months supply in the next 10-14 business days. She also informed that the next refill could be placed the 2nd week of November.  Will followup with patient in 14-21 business days to confirm receipt of medication.  [name] P. Ciara Kagan, [place] Management [phone]

## 2018-10-05 ENCOUNTER — Other Ambulatory Visit: Payer: Self-pay | Admitting: Pharmacy Technician

## 2018-10-05 NOTE — Patient Outreach (Signed)
[name] Care Management  10/05/2018  Ann Collins 03-19-1945 [id number]   Care coordination made to [place] in regards to patient's application for Jardiance.  Spoke to [place] who informed patient had been APPROVED 10/02/2018-02/07/2019. She informed a 90 days supply of medication was shipped today.  Will followup with patient in 5-7 business days to inquire if medication was received.  Draper Gallon P. Kimblery Diop, [place] Management [phone]

## 2018-10-10 ENCOUNTER — Other Ambulatory Visit: Payer: Self-pay | Admitting: Cardiovascular Disease

## 2018-10-12 ENCOUNTER — Other Ambulatory Visit: Payer: Self-pay | Admitting: Pharmacy Technician

## 2018-10-12 NOTE — Patient Outreach (Signed)
[place] [facility]) Care Management  10/12/2018  [name] 30-Nov-1945 [id number]   Unsuccessful outreach call placed to patient in regards to [place] application for [place] and [employer] application for [employer].  Unfortunately patient did not answer the phone, HIPAA compliant voicemail was left.  Was calling patient to inquire if she had received the medications from the patient assistance companies.  Will attempt another outreach in 3-7 business days.  [place]ak[place] Thiam P. Syna Gad, [place] Management [phone]

## 2018-10-16 ENCOUNTER — Other Ambulatory Visit: Payer: Self-pay | Admitting: Pharmacy Technician

## 2018-10-16 NOTE — Patient Outreach (Signed)
[facility]) Care Management  10/16/2018  SHIVYA SCHRAM Apr 09, 1945 [id number]  Incoming call received from patient in regards to [employer] application for [place] and [employer] for [employer].  Spoke to patient, HIPAA identifiers verified.  Patient informed she received a 90 days supply of Jardiance. Discussed refill procedure with patient. Patient verbalized understanding.  Patient informed she received a call that her Tyler Aas was at the office to pick up and she was headed there after our phone call. Informed patient she should be receiving 2 boxes. Discussed refill procedure with patient. Refills must go thru the provider's office per [employer] requirements. Patient verbalized understanding.  Patient informed she has not started the [place] and was not sure when she is to start it. Informed patient I would have embedded [facility] RPh [name] contact her.  Confirmed patient had name and number.  Will route note to embedded [facility] RPh [name] with patient's question and to notify her that patient assistance has been completed. Will remove myself from care team.  Luiz Ochoa. Tamarra Geiselman, [place] Management [phone]

## 2018-10-18 ENCOUNTER — Ambulatory Visit: Payer: Medicare Other | Admitting: Pharmacist

## 2018-10-18 DIAGNOSIS — E1165 Type 2 diabetes mellitus with hyperglycemia: Secondary | ICD-10-CM

## 2018-10-18 DIAGNOSIS — Z23 Encounter for immunization: Secondary | ICD-10-CM | POA: Diagnosis not present

## 2018-10-18 NOTE — Chronic Care Management (AMB) (Signed)
Chronic Care Management   Follow Up Note   10/18/2018 Name: Ann Collins MRN: [id number] DOB: July 28, 1945  Referred by: Einar Pheasant, MD Reason for referral : Chronic Care Management (Medication Management)   Ann Collins is a 73 y.o. year old female who is a primary care patient of Einar Pheasant, MD. The CCM team was consulted for assistance with chronic disease management and care coordination needs.    Contacted patient for medication management support today.   Review of patient status, including review of consultants reports, relevant laboratory and other test results, and collaboration with appropriate care team members and the patient's provider was performed as part of comprehensive patient evaluation and provision of chronic care management services.    SDOH (Social Determinants of Health) screening performed today: Financial Strain . See Care Plan for related entries.   Outpatient Encounter Medications as of 10/18/2018  Medication Sig Note  . insulin degludec (TRESIBA) 100 UNIT/ML SOPN FlexTouch Pen Inject 0.14 mLs (14 Units total) into the skin daily. 10/18/2018: 12 units Tyler Aas  . aspirin 81 MG tablet Take 81 mg by mouth daily.   Marland Kitchen atorvastatin (LIPITOR) 40 MG tablet Take 1 tablet (40 mg total) by mouth daily.   . carvedilol (COREG) 3.125 MG tablet Take 1 tablet (3.125 mg total) by mouth 2 (two) times daily.   . Cyanocobalamin 1500 MCG TBDP Take 1,500 mcg by mouth daily.   . empagliflozin (JARDIANCE) 10 MG TABS tablet Take 10 mg by mouth daily before breakfast. (Patient not taking: Reported on 10/18/2018)   . Glucosamine 500 MG CAPS Take 1 capsule by mouth as needed. 09/20/2018: Taking for wrist pain  . glucose blood test strip Use as instructed to blood sugar twice daily   . Insulin Pen Needle 32G X 6 MM MISC To fit TRESIBA PEN   . levothyroxine (SYNTHROID) 100 MCG tablet Take 1 tablet (100 mcg total) by mouth daily.   Marland Kitchen lisinopril (ZESTRIL) 20 MG tablet Take 1  tablet (20 mg total) by mouth daily. *NEEDS OFFICE VISIT FOR FURTHER REFILLS*   . pantoprazole (PROTONIX) 40 MG tablet Take 40 mg by mouth as needed.     No facility-administered encounter medications on file as of 10/18/2018.      Goals Addressed            This Visit's Progress     Patient Stated   . "I need to get my blood sugar better controlled" (pt-stated)       Current Barriers:  . Diabetes: uncontrolled though improved per SMBG; most recent A1c 11.2%  o Reports that dose increase from 10 to 12 units of Tresiba did not seem to have a significant impact on sugar control o Received Jardiance from [employer] patient assistance; has not started yet o Received Tresiba from [employer] patient assistance, though was not given pen needles. They are here at office for her to pick up . Current antihyperglycemic regimen: Tresiba 12 units daily (cut back to prevent hypoglycemia); Jardiance 10 mg daily o Notes that she stopped metformin back in summer of 2019 because "they told me to stop it at the hospital". No documented reason why, metformin was removed from her medication list in 07/2017 d/t patient not taking. . Denies hypoglycemic symptoms.  . Current meal patterns: o Breakfast: Cereal; sausage + eggs; 1 piece wheat toast;  o Lunch: usually leftovers; meat loaf, beans, slaw; loves potatoes  o Supper: Typically cooks supper  o Snacks: doesn't typically snack, sometimes  has  o Drinks: Diet coke (at least 6 per day); sugar free Nature's Twist (but diluted);  Marland Kitchen Current exercise: limited by pandemic; was previously working out 3 days per week . Current blood glucose readings: Fastings: 100-110s; post prandial/evenings 100s-195 . Cardiovascular risk reduction: o Current hypertensive regimen: lisinopril 20 mg daily, carvedilol 3.125 mg BID; reports home BP 110-130/60-70s;  o Current hyperlipidemia regimen: atorvastatin 40 mg daily; last LDL at goal <70;   Pharmacist Clinical  Goal(s):  Marland Kitchen Over the next 90 days, [name] and primary care provider to address optimized glycemic management  Interventions: . Start Jardiance 10 mg. Patient counseled on s/sx genitourinary infections, instructed to call me or the office with any concerns.   Rosaura Carpenter Tyler Aas to 8 units daily to prevent hypoglycemia. If sugars remain controlled or if she has lows, will plan to d/c Tresiba completely.  . Patient congratulated for improvement in blood sugars and fantastic job of checking regularly  Patient Self Care Activities:  . Patient will check blood glucose BID-TID, document, and provide at future appointments . Patient will take medications as prescribed . Patient will contact provider with any episodes of hypoglycemia . Patient will report any questions or concerns to provider  . Patient will collaborate on completion of patient assistance application  Please see past updates related to this goal by clicking on the "Past Updates" button in the selected goal          Plan:  - Will outreach patient in 2-3 weeks for continued medication management support  [name], PharmD, [place] Pharmacist [facility] [phone]

## 2018-10-18 NOTE — Progress Notes (Signed)
Reviewed information.  Agree with assessment and plan.    Dr Aidenjames Heckmann 

## 2018-10-18 NOTE — Patient Instructions (Signed)
Visit Information  Goals Addressed            This Visit's Progress     Patient Stated   . "I need to get my blood sugar better controlled" (pt-stated)       Current Barriers:  . Diabetes: uncontrolled though improved per SMBG; most recent A1c 11.2%  o Reports that dose increase from 10 to 12 units of Tresiba did not seem to have a significant impact on sugar control o Received Jardiance from [employer] patient assistance; has not started yet o Received Tresiba from [employer] patient assistance, though was not given pen needles. They are here at office for her to pick up . Current antihyperglycemic regimen: Tresiba 12 units daily (cut back to prevent hypoglycemia); Jardiance 10 mg daily o Notes that she stopped metformin back in summer of 2019 because "they told me to stop it at the hospital". No documented reason why, metformin was removed from her medication list in 07/2017 d/t patient not taking. . Denies hypoglycemic symptoms.  . Current meal patterns: o Breakfast: Cereal; sausage + eggs; 1 piece wheat toast;  o Lunch: usually leftovers; meat loaf, beans, slaw; loves potatoes  o Supper: Typically cooks supper  o Snacks: doesn't typically snack, sometimes has  o Drinks: Diet coke (at least 6 per day); sugar free Nature's Twist (but diluted);  Marland Kitchen Current exercise: limited by pandemic; was previously working out 3 days per week . Current blood glucose readings: Fast[place]: 100-110s; post prandial/evenings 100s-195 . Cardiovascular risk reduction: o Current hypertensive regimen: lisinopril 20 mg daily, carvedilol 3.125 mg BID; reports home BP 110-130/60-70s;  o Current hyperlipidemia regimen: atorvastatin 40 mg daily; last LDL at goal <70;   Pharmacist Clinical Goal(s):  Marland Kitchen Over the next 90 days, [name] and primary care provider to address optimized glycemic management  Interventions: . Start Jardiance 10 mg. Patient counseled on s/sx genitourinary  infections, instructed to call me or the office with any concerns.   Rosaura Carpenter Tyler Aas to 8 units daily to prevent hypoglycemia. If sugars remain controlled or if she has lows, will plan to d/c Tresiba completely.  . Patient congratulated for improvement in blood sugars and fantastic job of checking regularly  Patient Self Care Activities:  . Patient will check blood glucose BID-TID, document, and provide at future appointments . Patient will take medications as prescribed . Patient will contact provider with any episodes of hypoglycemia . Patient will report any questions or concerns to provider  . Patient will collaborate on completion of patient assistance application  Please see past updates related to this goal by clicking on the "Past Updates" button in the selected goal         The patient verbalized understanding of instructions provided today and declined a print copy of patient instruction materials.   Plan:  - Will outreach patient in 2-3 weeks for continued medication management support  [name], PharmD, [place] Pharmacist [facility] [place] [phone]

## 2018-11-18 ENCOUNTER — Other Ambulatory Visit: Payer: Self-pay | Admitting: Physician Assistant

## 2018-11-19 ENCOUNTER — Other Ambulatory Visit: Payer: Self-pay | Admitting: Physician Assistant

## 2018-12-18 ENCOUNTER — Other Ambulatory Visit: Payer: Self-pay | Admitting: Physician Assistant

## 2018-12-18 NOTE — Telephone Encounter (Signed)
Please schedule F/U appointment. Thank you! 

## 2018-12-21 NOTE — Telephone Encounter (Signed)
Appointment scheduled for 01/24/2019- Virtual With [employer]

## 2018-12-31 ENCOUNTER — Ambulatory Visit: Payer: Self-pay | Admitting: Pharmacist

## 2018-12-31 DIAGNOSIS — E1165 Type 2 diabetes mellitus with hyperglycemia: Secondary | ICD-10-CM

## 2018-12-31 NOTE — Patient Instructions (Signed)
Visit Information  Goals Addressed            This Visit's Progress     Patient Stated   . "I need to get my blood sugar better controlled" (pt-stated)       Current Barriers:  . Diabetes: uncontrolled though improved per SMBG; most recent A1c 11.2%  o Notes that her son passed away unexpectedly on Jan 03, 2023; they are having the memorial for him today as today is his birthday o Patient dropped off assistance application for [place] for [place] for 2021 . Current antihyperglycemic regimen: Tresiba 10 units daily (notes occasionally higher); Jardiance 10 mg daily o Notes that she stopped metformin back in summer of 2019 because "they told me to stop it at the hospital". No documented reason why, metformin was removed from her medication list in 07/2017 d/t patient not taking. . Denies hypoglyceima . Current exercise: limited by pandemic; was previously working out 3 days per week . Current blood glucose readings: reports this morning was 112  . Cardiovascular risk reduction: o Current hypertensive regimen: lisinopril 20 mg daily, carvedilol 3.125 mg BID; o Current hyperlipidemia regimen: atorvastatin 40 mg daily; last LDL at goal <70;   Pharmacist Clinical Goal(s):  Marland Kitchen Over the next 90 days, [name] and primary care provider to address optimized glycemic management  Interventions: . Patient not in a mental place to review BG and determine appropriate medication dosing at this time. Scheduled phone call next week for review. Will determine whether to apply for Jardiance 10 mg or 25 mg at that time  Patient Self Care Activities:  . Patient will check blood glucose BID-TID, document, and provide at future appointments . Patient will take medications as prescribed . Patient will contact provider with any episodes of hypoglycemia . Patient will report any questions or concerns to provider  . Patient will collaborate on completion of patient assistance  application  Please see past updates related to this goal by clicking on the "Past Updates" button in the selected goal         The patient verbalized understanding of instructions provided today and declined a print copy of patient instruction materials.   Plan: - Will outreach patient next week as scheduled  [name], PharmD Clinical Pharmacist [place] [phone]

## 2018-12-31 NOTE — Progress Notes (Signed)
Reviewed.  Agree with plan   Dr Brandonn Capelli 

## 2018-12-31 NOTE — Chronic Care Management (AMB) (Signed)
Chronic Care Management   Follow Up Note   12/31/2018 Name: Ann Collins MRN: [id number] DOB: 1945/07/23  Referred by: Einar Pheasant, MD Reason for referral : Chronic Care Management (Medication Mangagement)   Ann Collins is a 73 y.o. year old female who is a primary care patient of Einar Pheasant, MD. The CCM team was consulted for assistance with chronic disease management and care coordination needs.    Contacted patient for medication management review.   Review of patient status, including review of consultants reports, relevant laboratory and other test results, and collaboration with appropriate care team members and the patient's provider was performed as part of comprehensive patient evaluation and provision of chronic care management services.    SDOH (Social Determinants of Health) screening performed today: Financial Strain . See Care Plan for related entries.   Outpatient Encounter Medications as of 12/31/2018  Medication Sig Note  . aspirin 81 MG tablet Take 81 mg by mouth daily.   Marland Kitchen atorvastatin (LIPITOR) 40 MG tablet TAKE 1 TABLET BY MOUTH EVERY DAY   . carvedilol (COREG) 3.125 MG tablet TAKE 1 TABLET BY MOUTH TWICE DAILY   . Cyanocobalamin 1500 MCG TBDP Take 1,500 mcg by mouth daily.   . empagliflozin (JARDIANCE) 10 MG TABS tablet Take 10 mg by mouth daily before breakfast. (Patient not taking: Reported on 10/18/2018)   . Glucosamine 500 MG CAPS Take 1 capsule by mouth as needed. 09/20/2018: Taking for wrist pain  . glucose blood test strip Use as instructed to blood sugar twice daily   . insulin degludec (TRESIBA) 100 UNIT/ML SOPN FlexTouch Pen Inject 0.14 mLs (14 Units total) into the skin daily. 10/18/2018: 12 units Tyler Aas  . Insulin Pen Needle 32G X 6 MM MISC To fit TRESIBA PEN   . levothyroxine (SYNTHROID) 100 MCG tablet Take 1 tablet (100 mcg total) by mouth daily.   Marland Kitchen lisinopril (ZESTRIL) 20 MG tablet Take 1 tablet (20 mg total) by mouth daily. *NEEDS  OFFICE VISIT FOR FURTHER REFILLS*   . pantoprazole (PROTONIX) 40 MG tablet Take 40 mg by mouth as needed.     No facility-administered encounter medications on file as of 12/31/2018.      Goals Addressed            This Visit's Progress     Patient Stated   . "I need to get my blood sugar better controlled" (pt-stated)       Current Barriers:  . Diabetes: uncontrolled though improved per SMBG; most recent A1c 11.2%  o Notes that her son passed away unexpectedly on 01/01/23; they are having the memorial for him today as today is his birthday o Patient dropped off assistance application for [place] for [place] for 2021 . Current antihyperglycemic regimen: Tresiba 10 units daily (notes occasionally higher); Jardiance 10 mg daily o Notes that she stopped metformin back in summer of 2019 because "they told me to stop it at the hospital". No documented reason why, metformin was removed from her medication list in 07/2017 d/t patient not taking. . Denies hypoglyceima . Current exercise: limited by pandemic; was previously working out 3 days per week . Current blood glucose readings: reports this morning was 112  . Cardiovascular risk reduction: o Current hypertensive regimen: lisinopril 20 mg daily, carvedilol 3.125 mg BID; o Current hyperlipidemia regimen: atorvastatin 40 mg daily; last LDL at goal <70;   Pharmacist Clinical Goal(s):  Marland Kitchen Over the next 90 days, [name] and primary  care provider to address optimized glycemic management  Interventions: . Patient not in a mental place to review BG and determine appropriate medication dosing at this time. Scheduled phone call next week for review. Will determine whether to apply for Jardiance 10 mg or 25 mg at that time  Patient Self Care Activities:  . Patient will check blood glucose BID-TID, document, and provide at future appointments . Patient will take medications as prescribed . Patient will contact  provider with any episodes of hypoglycemia . Patient will report any questions or concerns to provider  . Patient will collaborate on completion of patient assistance application  Please see past updates related to this goal by clicking on the "Past Updates" button in the selected goal          Plan: - Will outreach patient next week as scheduled  [name], PharmD Clinical Pharmacist [place] [phone]

## 2019-01-10 ENCOUNTER — Telehealth: Payer: Medicare Other

## 2019-01-17 ENCOUNTER — Ambulatory Visit (INDEPENDENT_AMBULATORY_CARE_PROVIDER_SITE_OTHER): Payer: Medicare Other | Admitting: Pharmacist

## 2019-01-17 DIAGNOSIS — E1165 Type 2 diabetes mellitus with hyperglycemia: Secondary | ICD-10-CM

## 2019-01-17 MED ORDER — JARDIANCE 25 MG PO TABS: 25.0000 mg | ORAL_TABLET | Freq: Every day | ORAL | 3 refills | Status: DC

## 2019-01-17 NOTE — Patient Instructions (Signed)
Visit Information  Goals Addressed            This Visit's Progress     Patient Stated   . "I need to get my blood sugar better controlled" (pt-stated)       Current Barriers:  . Diabetes: uncontrolled though improved per SMBG; most recent A1c 11.2%  o Has been out of Jardiance for ~3 days, so increased [place] back to 14 units daily.  . Current antihyperglycemic regimen: Tresiba 14 units daily, Jardiance 10 mg daily (has been out for ~3 days) o Notes that she stopped metformin back in summer of 2019 because "they told me to stop it at the hospital". No documented reason why, metformin was removed from her medication list in 07/2017 d/t patient not taking. . Denies hypoglycemia . Current blood glucose readings: fasting: 110, 114, 150  . Cardiovascular risk reduction: o Current hypertensive regimen: lisinopril 20 mg daily- has NOT been taking lisinopril; carvedilol 3.125 mg BID; BP today 134/70 - F/u with Dr. Fletcher Anon scheduled next week o Current hyperlipidemia regimen: atorvastatin 40 mg daily; last LDL at goal <70;   Pharmacist Clinical Goal(s):  Marland Kitchen Over the next 90 days, [name] and primary care provider to address optimized glycemic management  Interventions: . Comprehensive medication review performed, medication list updated in electronic medical record.  . Provided phone number to patient to reorder Jardiance from [facility] [phone]). She is going to call today, let them know she is completely out of medications, and hopefully they will be able to expedite the shipment.  . Recommend increasing Jardiance to 25 mg daily. Will submit [id number] re-application for Jardiance 25 mg daily. Hopefully, patient will be able to reduce/eliminate insulin use with this dose increase. Collaborated w/ Dr. Nicki Reaper for her signature, submitted to [facility] . Encouraged to continue to check BP over the next week before appointment w/ Dr. Fletcher Anon. Encouraged to let him know that she has  not been taking lisinopril. Reviewed nephroprotective benefit of ACEi in DM  . Patient has not been taking ASA regularly, skips it on days that she takes ibuprofen. Explained that this is not necessary, and to take ASA 81 mg daily for primary prevention . Collaborated w/ office staff to schedule patient for f/u lab work and appointment w/ Dr. Nicki Reaper  Patient Self Care Activities:  . Patient will check blood glucose BID-TID, document, and provide at future appointments . Patient will take medications as prescribed . Patient will contact provider with any episodes of hypoglycemia . Patient will report any questions or concerns to provider  . Patient will collaborate on completion of patient assistance application  Please see past updates related to this goal by clicking on the "Past Updates" button in the selected goal         The patient verbalized understanding of instructions provided today and declined a print copy of patient instruction materials.    Plan: - Will pass materials along to CPhT for follow up with [facility] - Will outreach patient for medication management support in ~4-5 weeks  [name], PharmD, [place], [place] Pharmacist [place] [phone]

## 2019-01-17 NOTE — Chronic Care Management (AMB) (Signed)
Chronic Care Management   Follow Up Note   01/17/2019 Name: Ann Collins MRN: [id number] DOB: 1945-12-10  Referred by: Einar Pheasant, MD Reason for referral : Chronic Care Management (Medication Management)   Ann Collins is a 73 y.o. year old female who is a primary care patient of Einar Pheasant, MD. The CCM team was consulted for assistance with chronic disease management and care coordination needs.    Contacted patient for medication management review.   Review of patient status, including review of consultants reports, relevant laboratory and other test results, and collaboration with appropriate care team members and the patient's provider was performed as part of comprehensive patient evaluation and provision of chronic care management services.    SDOH (Social Determinants of Health) screening performed today: Financial Strain . See Care Plan for related entries.   Outpatient Encounter Medications as of 01/17/2019  Medication Sig Note  . aspirin 81 MG tablet Take 81 mg by mouth daily.   Marland Kitchen atorvastatin (LIPITOR) 40 MG tablet TAKE 1 TABLET BY MOUTH EVERY DAY   . carvedilol (COREG) 3.125 MG tablet TAKE 1 TABLET BY MOUTH TWICE DAILY   . Cyanocobalamin 1500 MCG TBDP Take 1,500 mcg by mouth daily.   . Glucosamine 500 MG CAPS Take 1 capsule by mouth as needed. 09/20/2018: Taking for wrist pain  . glucose blood test strip Use as instructed to blood sugar twice daily   . insulin degludec (TRESIBA) 100 UNIT/ML SOPN FlexTouch Pen Inject 0.14 mLs (14 Units total) into the skin daily.   . Insulin Pen Needle 32G X 6 MM MISC To fit TRESIBA PEN   . levothyroxine (SYNTHROID) 100 MCG tablet Take 1 tablet (100 mcg total) by mouth daily.   . empagliflozin (JARDIANCE) 10 MG TABS tablet Take 10 mg by mouth daily before breakfast. (Patient not taking: Reported on 10/18/2018)   . lisinopril (ZESTRIL) 20 MG tablet Take 1 tablet (20 mg total) by mouth daily. *NEEDS OFFICE VISIT FOR FURTHER  REFILLS* (Patient not taking: Reported on 01/17/2019)   . pantoprazole (PROTONIX) 40 MG tablet Take 40 mg by mouth as needed.     No facility-administered encounter medications on file as of 01/17/2019.     Goals Addressed            This Visit's Progress     Patient Stated   . "I need to get my blood sugar better controlled" (pt-stated)       Current Barriers:  . Diabetes: uncontrolled though improved per SMBG; most recent A1c 11.2%  o Has been out of Jardiance for ~3 days, so increased [place] back to 14 units daily.  . Current antihyperglycemic regimen: Tresiba 14 units daily, Jardiance 10 mg daily (has been out for ~3 days) o Notes that she stopped metformin back in summer of 2019 because "they told me to stop it at the hospital". No documented reason why, metformin was removed from her medication list in 07/2017 d/t patient not taking. . Denies hypoglycemia . Current blood glucose readings: fasting: 110, 114, 150  . Cardiovascular risk reduction: o Current hypertensive regimen: lisinopril 20 mg daily- has NOT been taking lisinopril; carvedilol 3.125 mg BID; BP today 134/70 - F/u with Dr. Fletcher Anon scheduled next week o Current hyperlipidemia regimen: atorvastatin 40 mg daily; last LDL at goal <70;   Pharmacist Clinical Goal(s):  Marland Kitchen Over the next 90 days, [name] and primary care provider to address optimized glycemic management  Interventions: . Comprehensive medication  review performed, medication list updated in electronic medical record.  . Provided phone number to patient to reorder Jardiance from [facility] [phone]). She is going to call today, let them know she is completely out of medications, and hopefully they will be able to expedite the shipment.  . Recommend increasing Jardiance to 25 mg daily. Will submit [id number] re-application for Jardiance 25 mg daily. Hopefully, patient will be able to reduce/eliminate insulin use with this dose increase.  Collaborated w/ Dr. Nicki Reaper for her signature, submitted to [facility] . Encouraged to continue to check BP over the next week before appointment w/ Dr. Fletcher Anon. Encouraged to let him know that she has not been taking lisinopril. Reviewed nephroprotective benefit of ACEi in DM  . Patient has not been taking ASA regularly, skips it on days that she takes ibuprofen. Explained that this is not necessary, and to take ASA 81 mg daily for primary prevention . Collaborated w/ office staff to schedule patient for f/u lab work and appointment w/ Dr. Nicki Reaper  Patient Self Care Activities:  . Patient will check blood glucose BID-TID, document, and provide at future appointments . Patient will take medications as prescribed . Patient will contact provider with any episodes of hypoglycemia . Patient will report any questions or concerns to provider  . Patient will collaborate on completion of patient assistance application  Please see past updates related to this goal by clicking on the "Past Updates" button in the selected goal          Plan: - Will pass materials along to CPhT for follow up with [facility] - Will outreach patient for medication management support in ~4-5 weeks  [name], PharmD, [place], [place] Pharmacist [employer] [phone]

## 2019-01-17 NOTE — Progress Notes (Signed)
Reviewed above information.  Agree with assessment and plan.    Dr Kambrey Hagger 

## 2019-01-21 ENCOUNTER — Other Ambulatory Visit (INDEPENDENT_AMBULATORY_CARE_PROVIDER_SITE_OTHER): Payer: Medicare Other

## 2019-01-21 ENCOUNTER — Other Ambulatory Visit: Payer: Self-pay

## 2019-01-21 DIAGNOSIS — E7800 Pure hypercholesterolemia, unspecified: Secondary | ICD-10-CM | POA: Diagnosis not present

## 2019-01-21 DIAGNOSIS — I10 Essential (primary) hypertension: Secondary | ICD-10-CM

## 2019-01-21 DIAGNOSIS — E1165 Type 2 diabetes mellitus with hyperglycemia: Secondary | ICD-10-CM | POA: Diagnosis not present

## 2019-01-21 LAB — BASIC METABOLIC PANEL
BUN: 17 mg/dL (ref 6–23)
CO2: 28 mEq/L (ref 19–32)
Calcium: 9.3 mg/dL (ref 8.4–10.5)
Chloride: 108 mEq/L (ref 96–112)
Creatinine, Ser: 1.04 mg/dL (ref 0.40–1.20)
GFR: 51.92 mL/min — ABNORMAL LOW (ref >60.00)
Glucose, Bld: 147 mg/dL — ABNORMAL HIGH (ref 70–99)
Potassium: 4.8 mEq/L (ref 3.5–5.1)
Sodium: 143 mEq/L (ref 135–145)

## 2019-01-21 LAB — HEPATIC FUNCTION PANEL
ALT: 17 U/L (ref 0–35)
AST: 14 U/L (ref 0–37)
Albumin: 4 g/dL (ref 3.5–5.2)
Alkaline Phosphatase: 93 U/L (ref 39–117)
Bilirubin, Direct: 0.1 mg/dL (ref 0.0–0.3)
Total Bilirubin: 0.4 mg/dL (ref 0.2–1.2)
Total Protein: 6.4 g/dL (ref 6.0–8.3)

## 2019-01-21 LAB — CBC WITH DIFFERENTIAL/PLATELET
Basophils Absolute: 0.1 10*3/uL (ref 0.0–0.1)
Basophils Relative: 1.3 % (ref 0.0–3.0)
Eosinophils Absolute: 0.3 10*3/uL (ref 0.0–0.7)
Eosinophils Relative: 3.1 % (ref 0.0–5.0)
HCT: 43.9 % (ref 36.0–46.0)
Hemoglobin: 14.2 g/dL (ref 12.0–15.0)
Lymphocytes Relative: 47.8 % — ABNORMAL HIGH (ref 12.0–46.0)
Lymphs Abs: 4 10*3/uL (ref 0.7–4.0)
MCHC: 32.4 g/dL (ref 30.0–36.0)
MCV: 90.6 fl (ref 78.0–100.0)
Monocytes Absolute: 0.5 10*3/uL (ref 0.1–1.0)
Monocytes Relative: 5.6 % (ref 3.0–12.0)
Neutro Abs: 3.5 10*3/uL (ref 1.4–7.7)
Neutrophils Relative %: 42.2 % — ABNORMAL LOW (ref 43.0–77.0)
Platelets: 138 10*3/uL — ABNORMAL LOW (ref 150.0–400.0)
RBC: 4.85 Mil/uL (ref 3.87–5.11)
RDW: 15.1 % (ref 11.5–15.5)
WBC: 8.3 10*3/uL (ref 4.0–10.5)

## 2019-01-21 LAB — LIPID PANEL
Cholesterol: 127 mg/dL (ref 0–200)
HDL: 37.5 mg/dL — ABNORMAL LOW (ref >39.00)
LDL Cholesterol: 64 mg/dL (ref 0–99)
NonHDL: 89.67
Total CHOL/HDL Ratio: 3
Triglycerides: 128 mg/dL (ref 0.0–149.0)
VLDL: 25.6 mg/dL (ref 0.0–40.0)

## 2019-01-21 LAB — HEMOGLOBIN A1C: Hgb A1c MFr Bld: 8.3 % — ABNORMAL HIGH (ref 4.6–6.5)

## 2019-01-23 ENCOUNTER — Ambulatory Visit (INDEPENDENT_AMBULATORY_CARE_PROVIDER_SITE_OTHER): Payer: Medicare Other | Admitting: Internal Medicine

## 2019-01-23 ENCOUNTER — Other Ambulatory Visit: Payer: Self-pay | Admitting: Pharmacy Technician

## 2019-01-23 ENCOUNTER — Encounter: Payer: Self-pay | Admitting: Internal Medicine

## 2019-01-23 ENCOUNTER — Other Ambulatory Visit: Payer: Self-pay

## 2019-01-23 VITALS — BP 130/72 | Ht 62.0 in | Wt 145.0 lb

## 2019-01-23 DIAGNOSIS — I700 Atherosclerosis of aorta: Secondary | ICD-10-CM | POA: Diagnosis not present

## 2019-01-23 DIAGNOSIS — I2510 Atherosclerotic heart disease of native coronary artery without angina pectoris: Secondary | ICD-10-CM | POA: Diagnosis not present

## 2019-01-23 DIAGNOSIS — K2100 Gastro-esophageal reflux disease with esophagitis, without bleeding: Secondary | ICD-10-CM | POA: Diagnosis not present

## 2019-01-23 DIAGNOSIS — I10 Essential (primary) hypertension: Secondary | ICD-10-CM

## 2019-01-23 DIAGNOSIS — E1165 Type 2 diabetes mellitus with hyperglycemia: Secondary | ICD-10-CM

## 2019-01-23 DIAGNOSIS — E2839 Other primary ovarian failure: Secondary | ICD-10-CM | POA: Diagnosis not present

## 2019-01-23 DIAGNOSIS — E7800 Pure hypercholesterolemia, unspecified: Secondary | ICD-10-CM

## 2019-01-23 DIAGNOSIS — S62102D Fracture of unspecified carpal bone, left wrist, subsequent encounter for fracture with routine healing: Secondary | ICD-10-CM

## 2019-01-23 DIAGNOSIS — E039 Hypothyroidism, unspecified: Secondary | ICD-10-CM

## 2019-01-23 DIAGNOSIS — D696 Thrombocytopenia, unspecified: Secondary | ICD-10-CM

## 2019-01-23 NOTE — Progress Notes (Signed)
Patient ID: Ann Collins, female   DOB: [birth year], 73 y.o.   MRN: [id number]   Virtual Visit via video Note  This visit type was conducted due to national recommendations for restrictions regarding the COVID-19 pandemic (e.g. social distancing).  This format is felt to be most appropriate for this patient at this time.  All issues noted in this document were discussed and addressed.  No physical exam was performed (except for noted visual exam findings with Video Visits).   I connected with Ann Collins by a video enabled telemedicine application and verified that I am speaking with the correct person using two identifiers. Location patient: home Location provider: work  Persons participating in the virtual visit: patient, provider  I discussed the limitations, risks, security and privacy concerns of performing an evaluation and management service by video and the availability of in person appointments. The patient expressed understanding and agreed to proceed.   Reason for visit: scheduled follow up.   HPI: Increased stress.  Son passed away 2019-01-04.  Discussed with her today.  She does not feel needs any further intervention.  She is seeing [name] for diabetes/medication management.  Currently on Tresiba.  Out of Jardiance.  Trying to get through patient assistance.  Planning to increase to 41m.  States sugars are doing better.  a1c improved.  She reports am sugars 130-150.  Reports her blood pressure varies.  Had readings from 112-132/68-80.  One reading 159/88.  Not taking lisinopril. Discussed the need to restart.  Is taking coreg.  Reports no chest pain or sob.  Breathing stable.  Continues to smoke.  Has f/u with Dr AFletcher Anontomorrow.  States pain in her left wrist - better.  She can make a fist now.  Discussed f/u with ortho.  She wants to hold on any f/u at this time.     ROS: See pertinent positives and negatives per HPI.  Past Medical History:  Diagnosis Date  . Abdominal hernia    . CAD (coronary artery disease)    a. LHC 7/19: LM nl, p-mLAD 100% w/ right to left collaterals, OM2 90% (2 mm), 1st LPL 40%, medical management  . Diabetes mellitus (HRio   . Diverticulosis   . GERD (gastroesophageal reflux disease)   . Hematuria   . Hypercholesterolemia   . Hypertension   . Hypothyroidism   . Kidney stones     Past Surgical History:  Procedure Laterality Date  . APPENDECTOMY    . BREAST BIOPSY  1969   right  . CHOLECYSTECTOMY  2005  . COLONOSCOPY    . COLONOSCOPY WITH PROPOFOL N/A 09/19/2017   Procedure: COLONOSCOPY WITH PROPOFOL;  Surgeon: Toledo, TBenay Pike MD;  Location: [facility] ENDOSCOPY;  Service: Gastroenterology;  Laterality: N/A;  . ESOPHAGOGASTRODUODENOSCOPY (EGD) WITH PROPOFOL N/A 09/19/2017   Procedure: ESOPHAGOGASTRODUODENOSCOPY (EGD) WITH PROPOFOL;  Surgeon: Toledo, TBenay Pike MD;  Location: [facility] ENDOSCOPY;  Service: Gastroenterology;  Laterality: N/A;  . LEFT HEART CATH AND CORONARY ANGIOGRAPHY Left 08/07/2017   Procedure: LEFT HEART CATH AND CORONARY ANGIOGRAPHY;  Surgeon: AWellington Hampshire MD;  [facility];  Service: Cardiovascular;  Laterality: Left;  . UPPER GI ENDOSCOPY      Family History  Problem Relation Age of Onset  . Brain cancer Mother        history of brain tumor  . Lung disease Father   . Diabetes Brother        x2  . Bipolar disorder Son   . Alcohol  abuse Son   . Breast cancer Neg Hx   . Colon cancer Neg Hx     SOCIAL HX: reviewed.    Current Outpatient Medications:  .  aspirin 81 MG tablet, Take 81 mg by mouth daily., Disp: , Rfl:  .  atorvastatin (LIPITOR) 40 MG tablet, TAKE 1 TABLET BY MOUTH EVERY DAY, Disp: 90 tablet, Rfl: 0 .  carvedilol (COREG) 3.125 MG tablet, TAKE 1 TABLET BY MOUTH TWICE DAILY, Disp: 180 tablet, Rfl: 0 .  cyanocobalamin 1000 MCG tablet, Take 1,000 mcg by mouth as needed. Takes occasionally, Disp: , Rfl:  .  empagliflozin (JARDIANCE) 10 MG TABS tablet, Take 10 mg by mouth daily.,  Disp: , Rfl:  .  Glucosamine 500 MG CAPS, Take 1 capsule by mouth as needed., Disp: , Rfl:  .  glucose blood test strip, Use as instructed to blood sugar twice daily, Disp: 100 each, Rfl: 12 .  insulin degludec (TRESIBA) 100 UNIT/ML SOPN FlexTouch Pen, Inject 0.14 mLs (14 Units total) into the skin daily. (Patient taking differently: Inject 10 Units into the skin daily. ), Disp: 2 pen, Rfl: 1 .  Insulin Pen Needle 32G X 6 MM MISC, To fit TRESIBA PEN, Disp: 90 each, Rfl: 1 .  levothyroxine (SYNTHROID) 100 MCG tablet, Take 1 tablet (100 mcg total) by mouth daily., Disp: 90 tablet, Rfl: 0 .  lisinopril (ZESTRIL) 20 MG tablet, Take 1 tablet (20 mg total) by mouth daily., Disp: 90 tablet, Rfl: 2 .  pantoprazole (PROTONIX) 40 MG tablet, Take 40 mg by mouth as needed. , Disp: , Rfl:   EXAM:  GENERAL: alert, oriented, appears well and in no acute distress  HEENT: atraumatic, conjunttiva clear, no obvious abnormalities on inspection of external nose and ears  NECK: normal movements of the head and neck  LUNGS: on inspection no signs of respiratory distress, breathing rate appears normal, no obvious gross SOB, gasping or wheezing  CV: no obvious cyanosis  PSYCH/NEURO: pleasant and cooperative, no obvious depression or anxiety, speech and thought processing grossly intact  ASSESSMENT AND PLAN:  Discussed the following assessment and plan:  Aortic atherosclerosis (HCC) On lipitor.   Diabetes mellitus a1c improved 8.3 on recent check - down from 11.2.  Sugar checks as outlined.  Not taking jardiance.  Plans to restart.  On tresiba.  Discussed low carb diet and exercise.  Follow met b and a1c.  Discussed the need for eye exam.  She will call to schedule.    GERD (gastroesophageal reflux disease) Controlled.    Hypercholesterolemia On lipitor.  Low cholesterol diet and exercise.  Follow lipid panel and liver function tests.   Lab Results  Component Value Date   CHOL 127 01/21/2019   HDL  37.50 (L) 01/21/2019   LDLCALC 64 01/21/2019   LDLDIRECT 71 10/18/2017   TRIG 128.0 01/21/2019   CHOLHDL 3 01/21/2019    Hypertension Blood pressure varying.  Not taking lisinopril.  Plans to restart.  Follow pressures.  Follow metabolic panel.   Hypothyroidism On thyroid replacement.  Follow tsh.   Left wrist fracture History of left wrist fracture.  Saw ortho.  Pain is better.  Desires no further intervention.  Schedule bone density.   Thrombocytopenia (HCC) Slightly decreased platelet count.  Discussed.  Consider abdominal evaluation to determine spleen size.      I discussed the assessment and treatment plan with the patient. The patient was provided an opportunity to ask questions and all were answered. The patient agreed with  the plan and demonstrated an understanding of the instructions.   The patient was advised to call back or seek an in-person evaluation if the symptoms worsen or if the condition fails to improve as anticipated.   Einar Pheasant, MD

## 2019-01-23 NOTE — Patient Outreach (Signed)
[name]) Care Management  01/23/2019  Ann Collins Apr 09, 1945 [id number]  Care coordination call placed to [place] in regards to [place] re enrollment application for [id number].  Spoke to Mya who informed patient has been APPROVED 02/08/2019-02/07/2019. She informed patient's next refill is due in March 2021.  Will route note to embedded [facility] pharmacist that patient assistance has been completed. Will remove myself from care team.  Luiz Ochoa. Adler Chartrand, [place] Management [phone]

## 2019-01-24 ENCOUNTER — Telehealth: Payer: Self-pay

## 2019-01-24 ENCOUNTER — Telehealth (INDEPENDENT_AMBULATORY_CARE_PROVIDER_SITE_OTHER): Payer: Medicare Other | Admitting: Cardiovascular Disease

## 2019-01-24 ENCOUNTER — Encounter: Payer: Self-pay | Admitting: Cardiovascular Disease

## 2019-01-24 VITALS — BP 162/93 | HR 72 | Ht 62.0 in | Wt 144.0 lb

## 2019-01-24 DIAGNOSIS — Z720 Tobacco use: Secondary | ICD-10-CM | POA: Diagnosis not present

## 2019-01-24 DIAGNOSIS — I10 Essential (primary) hypertension: Secondary | ICD-10-CM | POA: Diagnosis not present

## 2019-01-24 DIAGNOSIS — I2510 Atherosclerotic heart disease of native coronary artery without angina pectoris: Secondary | ICD-10-CM

## 2019-01-24 DIAGNOSIS — E785 Hyperlipidemia, unspecified: Secondary | ICD-10-CM

## 2019-01-24 MED ORDER — LISINOPRIL 20 MG PO TABS: 20.0000 mg | ORAL_TABLET | Freq: Every day | ORAL | 2 refills | Status: DC

## 2019-01-24 NOTE — Patient Instructions (Signed)
Medication Instructions:  Resume lisinopril 20 mg once daily.  A new prescription was sent to your pharmacy *If you need a refill on your cardiac medications before your next appointment, please call your pharmacy*  Lab Work: None If you have labs (blood work) drawn today and your tests are completely normal, you will receive your results only by: Marland Kitchen MyChart Message (if you have MyChart) OR . A paper copy in the mail If you have any lab test that is abnormal or we need to change your treatment, we will call you to review the results.  Testing/Procedures: None  Follow-Up: At [facility], you and your health needs are our priority.  As part of our continuing mission to provide you with exceptional heart care, we have created designated Provider Care Teams.  These Care Teams include your primary Cardiologist (physician) and Advanced Practice Providers (APPs -  Physician Assistants and Nurse Practitioners) who all work together to provide you with the care you need, when you need it.  Your next appointment:   6 month(s)  The format for your next appointment:   In Person  Provider:    You may see Kathlyn Sacramento, MD or one of the following Advanced Practice Providers on your designated Care Team:    Murray Hodgkins, NP  Christell Faith, PA-C  Marrianne Mood, PA-C   Other Instructions

## 2019-01-24 NOTE — Telephone Encounter (Signed)
Virtual Visit Pre-Appointment Phone Call  "Ann Collins, I am calling you today to discuss your upcoming appointment. We are currently trying to limit exposure to the virus that causes COVID-19 by seeing patients at home rather than in the office."  1. "What is the BEST phone number to call the day of the visit?" - include this in appointment notes  2. "Do you have or have access to (through a family member/friend) a smartphone with video capability that we can use for your visit?" a. If yes - list this number in appt notes as "cell" (if different from BEST phone #) and list the appointment type as a VIDEO visit in appointment notes b. If no - list the appointment type as a PHONE visit in appointment notes  3. Confirm consent - "In the setting of the current Covid19 crisis, you are scheduled for a video visit with your provider on 01/24/2019 at 9:20am.  Just as we do with many in-office visits, in order for you to participate in this visit, we must obtain consent.  If you'd like, I can send this to your mychart (if signed up) or email for you to review.  Otherwise, I can obtain your verbal consent now.  All virtual visits are billed to your insurance company just like a normal visit would be.  By agreeing to a virtual visit, we'd like you to understand that the technology does not allow for your provider to perform an examination, and thus may limit your provider's ability to fully assess your condition. If your provider identifies any concerns that need to be evaluated in person, we will make arrangements to do so.  Finally, though the technology is pretty good, we cannot assure that it will always work on either your or our end, and in the setting of a video visit, we may have to convert it to a phone-only visit.  In either situation, we cannot ensure that we have a secure connection.  Are you willing to proceed?" STAFF: Did the patient verbally acknowledge consent to telehealth visit? Document YES/NO here:  YES  4. Advise patient to be prepared - "Two hours prior to your appointment, go ahead and check your blood pressure, pulse, oxygen saturation, and your weight (if you have the equipment to check those) and write them all down. When your visit starts, your provider will ask you for this information. If you have an Apple Watch or Kardia device, please plan to have heart rate information ready on the day of your appointment. Please have a pen and paper handy nearby the day of the visit as well."  5. Give patient instructions for MyChart download to smartphone OR Doximity/Doxy.me as below if video visit (depending on what platform provider is using)  6. Inform patient they will receive a phone call 15 minutes prior to their appointment time (may be from unknown caller ID) so they should be prepared to answer    TELEPHONE CALL NOTE  Ann Collins has been deemed a candidate for a follow-up tele-health visit to limit community exposure during the Covid-19 pandemic. I spoke with the patient via phone to ensure availability of phone/video source, confirm preferred email & phone number, and discuss instructions and expectations.  I reminded Ann Collins to be prepared with any vital sign and/or heart rhythm information that could potentially be obtained via home monitoring, at the time of her visit. I reminded Ann Collins to expect a phone call prior to her visit.  Rene Paci McClain 01/24/2019 8:59 AM   INSTRUCTIONS FOR DOWNLOADING THE MYCHART APP TO SMARTPHONE  - The patient must first make sure to have activated MyChart and know their login information - If Apple, go to [employer] and type in MyChart in the search bar and download the app. If Android, ask patient to go to [employer] and type in [place] in the search bar and download the app. The app is free but as with any other app downloads, their phone may require them to verify saved payment information or Apple/Android  password.  - The patient will need to then log into the app with their MyChart username and password, and select [place] as their healthcare provider to link the account. When it is time for your visit, go to the MyChart app, find appointments, and click Begin Video Visit. Be sure to Select Allow for your device to access the Microphone and Camera for your visit. You will then be connected, and your provider will be with you shortly.  **If they have any issues connecting, or need assistance please contact MyChart service desk [phone])**  **If using a computer, in order to ensure the best quality for their visit they will need to use either of the following Internet Browsers: [employer], or Google Chrome**  IF USING DOXIMITY or DOXY.ME - The patient will receive a link just prior to their visit by text.     FULL LENGTH CONSENT FOR TELE-HEALTH VISIT   I hereby voluntarily request, consent and authorize [place] and its employed or contracted physicians, physician assistants, nurse practitioners or other licensed health care professionals (the Practitioner), to provide me with telemedicine health care services (the "Services") as deemed necessary by the treating Practitioner. I acknowledge and consent to receive the Services by the Practitioner via telemedicine. I understand that the telemedicine visit will involve communicating with the Practitioner through live audiovisual communication technology and the disclosure of certain medical information by electronic transmission. I acknowledge that I have been given the opportunity to request an in-person assessment or other available alternative prior to the telemedicine visit and am voluntarily participating in the telemedicine visit.  I understand that I have the right to withhold or withdraw my consent to the use of telemedicine in the course of my care at any time, without affecting my right to future care or treatment,  and that the Practitioner or I may terminate the telemedicine visit at any time. I understand that I have the right to inspect all information obtained and/or recorded in the course of the telemedicine visit and may receive copies of available information for a reasonable fee.  I understand that some of the potential risks of receiving the Services via telemedicine include:  Marland Kitchen Delay or interruption in medical evaluation due to technological equipment failure or disruption; . Information transmitted may not be sufficient (e.g. poor resolution of images) to allow for appropriate medical decision making by the Practitioner; and/or  . In rare instances, security protocols could fail, causing a breach of personal health information.  Furthermore, I acknowledge that it is my responsibility to provide information about my medical history, conditions and care that is complete and accurate to the best of my ability. I acknowledge that Practitioner's advice, recommendations, and/or decision may be based on factors not within their control, such as incomplete or inaccurate data provided by me or distortions of diagnostic images or specimens that may result from electronic transmissions. I understand that  the practice of medicine is not an exact science and that Practitioner makes no warranties or guarantees regarding treatment outcomes. I acknowledge that I will receive a copy of this consent concurrently upon execution via email to the email address I last provided but may also request a printed copy by calling the office of [place].    I understand that my insurance will be billed for this visit.   I have read or had this consent read to me. . I understand the contents of this consent, which adequately explains the benefits and risks of the Services being provided via telemedicine.  . I have been provided ample opportunity to ask questions regarding this consent and the Services and have had my questions  answered to my satisfaction. . I give my informed consent for the services to be provided through the use of telemedicine in my medical care  By participating in this telemedicine visit I agree to the above.

## 2019-01-24 NOTE — Progress Notes (Signed)
Virtual Visit via Video Note   This visit type was conducted due to national recommendations for restrictions regarding the COVID-19 Pandemic (e.g. social distancing) in an effort to limit this patient's exposure and mitigate transmission in our community.  Due to her co-morbid illnesses, this patient is at least at moderate risk for complications without adequate follow up.  This format is felt to be most appropriate for this patient at this time.  All issues noted in this document were discussed and addressed.  A limited physical exam was performed with this format.  Please refer to the patient's chart for her consent to telehealth for [facility].   Date:  01/24/2019   ID:  Ann Collins, DOB 1945-11-06, MRN [id number]  Patient Location: Home Provider Location: Office  PCP:  Einar Pheasant, MD  Cardiologist:  Kathlyn Sacramento, MD  Electrophysiologist:  None   Evaluation Performed:  Follow-Up Visit  Chief Complaint: Grief after death of her son  History of Present Illness:    Ann Collins is a 73 y.o. female was reached via video for a follow-up visit regarding coronary artery disease. She has chronic medical conditions that include diabetes mellitus, hyperlipidemia,  tobacco use and hypertension. She has known history of esophageal erosions and dysphagia.   She was evaluated in June 2019 for chest pain and abnormal stress test which showed evidence of LAD ischemia with low normal ejection fraction. Cardiac catheterization in July 2019 showed left dominant system with severe two-vessel coronary artery disease.  The LAD was chronically occluded proximally with well-developed collaterals from the right coronary artery.  There was also 90% stenosis in OM 2 which was a 2 mm vessel. Low normal LV systolic function with an EF of 50 to 55% with anterior and apical hypokinesis.  Mildly elevated left ventricular end-diastolic pressure.  She was treated medically with no  revascularization.  She has been doing reasonably well from a cardiac standpoint with no chest pain or shortness of breath.  Unfortunately, her son died 3 weeks ago from liver failure.  She has been depressed since then.  She continues to smoke about 1 pack/day. She is no longer taking lisinopril for unclear reasons but does not remember having any side effects.  Blood pressure is elevated today.    The patient does not have symptoms concerning for COVID-19 infection (fever, chills, cough, or new shortness of breath).    Past Medical History:  Diagnosis Date  . Abdominal hernia   . CAD (coronary artery disease)    a. LHC 7/19: LM nl, p-mLAD 100% w/ right to left collaterals, OM2 90% (2 mm), 1st LPL 40%, medical management  . Diabetes mellitus ([place])   . Diverticulosis   . GERD (gastroesophageal reflux disease)   . Hematuria   . Hypercholesterolemia   . Hypertension   . Hypothyroidism   . Kidney stones    Past Surgical History:  Procedure Laterality Date  . APPENDECTOMY    . BREAST BIOPSY  1969   right  . CHOLECYSTECTOMY  2005  . COLONOSCOPY    . COLONOSCOPY WITH PROPOFOL N/A 09/19/2017   Procedure: COLONOSCOPY WITH PROPOFOL;  Surgeon: Toledo, Benay Pike, MD;  Location: [facility] ENDOSCOPY;  Service: Gastroenterology;  Laterality: N/A;  . ESOPHAGOGASTRODUODENOSCOPY (EGD) WITH PROPOFOL N/A 09/19/2017   Procedure: ESOPHAGOGASTRODUODENOSCOPY (EGD) WITH PROPOFOL;  Surgeon: Toledo, Benay Pike, MD;  Location: [facility] ENDOSCOPY;  Service: Gastroenterology;  Laterality: N/A;  . LEFT HEART CATH AND CORONARY ANGIOGRAPHY Left 08/07/2017   Procedure: LEFT  HEART CATH AND CORONARY ANGIOGRAPHY;  Surgeon: Wellington Hampshire, MD;  Location: [facility] CV LAB;  Service: Cardiovascular;  Laterality: Left;  . UPPER GI ENDOSCOPY       Current Meds  Medication Sig  . aspirin 81 MG tablet Take 81 mg by mouth daily.  Marland Kitchen atorvastatin (LIPITOR) 40 MG tablet TAKE 1 TABLET BY MOUTH EVERY DAY  . carvedilol (COREG)  3.125 MG tablet TAKE 1 TABLET BY MOUTH TWICE DAILY  . cyanocobalamin 1000 MCG tablet Take 1,000 mcg by mouth as needed. Takes occasionally  . empagliflozin (JARDIANCE) 10 MG TABS tablet Take 10 mg by mouth daily.  . Glucosamine 500 MG CAPS Take 1 capsule by mouth as needed.  Marland Kitchen glucose blood test strip Use as instructed to blood sugar twice daily  . insulin degludec (TRESIBA) 100 UNIT/ML SOPN FlexTouch Pen Inject 0.14 mLs (14 Units total) into the skin daily. (Patient taking differently: Inject 10 Units into the skin daily. )  . Insulin Pen Needle 32G X 6 MM MISC To fit TRESIBA PEN  . levothyroxine (SYNTHROID) 100 MCG tablet Take 1 tablet (100 mcg total) by mouth daily.  . pantoprazole (PROTONIX) 40 MG tablet Take 40 mg by mouth as needed.      Allergies:   Pioglitazone, Macrobid [nitrofurantoin macrocrystal], and Nitrofurantoin   Social History   Tobacco Use  . Smoking status: Current Every Day Smoker    Packs/day: 1.00    Years: 50.00    Pack years: 50.00  . Smokeless tobacco: Never Used  Substance Use Topics  . Alcohol use: No    Alcohol/week: 0.0 standard drinks  . Drug use: No     Family Hx: The patient's family history includes Alcohol abuse in her son; Bipolar disorder in her son; Brain cancer in her mother; Diabetes in her brother; Lung disease in her father. There is no history of Breast cancer or Colon cancer.  ROS:   Please see the history of present illness.     All other systems reviewed and are negative.   Prior CV studies:   The following studies were reviewed today:    Labs/Other Tests and Data Reviewed:    EKG:  No ECG reviewed.  Recent Labs: 07/12/2018: TSH 1.52 01/21/2019: ALT 17; BUN 17; Creatinine, Ser 1.04; Hemoglobin 14.2; Platelets 138.0; Potassium 4.8; Sodium 143   Recent Lipid Panel Lab Results  Component Value Date/Time   CHOL 127 01/21/2019 09:10 AM   CHOL 134 10/18/2017 02:47 PM   TRIG 128.0 01/21/2019 09:10 AM   HDL 37.50 (L)  01/21/2019 09:10 AM   HDL 41 10/18/2017 02:47 PM   CHOLHDL 3 01/21/2019 09:10 AM   LDLCALC 64 01/21/2019 09:10 AM   LDLCALC 63 10/18/2017 02:47 PM   LDLDIRECT 71 10/18/2017 02:47 PM   LDLDIRECT 156.0 08/29/2016 03:23 PM    Wt Readings from Last 3 Encounters:  01/24/19 144 lb (65.3 kg)  01/23/19 145 lb (65.8 kg)  09/14/18 145 lb (65.8 kg)     Objective:    Vital Signs:  BP (!) 162/93 (BP Location: Left Arm, Patient Position: Sitting)   Pulse 72   Ht 5\' 2"  (1.575 m)   Wt 144 lb (65.3 kg) Comment: Estimated by patient  LMP 03/10/1991   BMI 26.34 kg/m    VITAL SIGNS:  reviewed GEN:  no acute distress EYES:  sclerae anicteric, EOMI - Extraocular Movements Intact RESPIRATORY:  normal respiratory effort, symmetric expansion SKIN:  no rash, lesions or ulcers. MUSCULOSKELETAL:  no obvious deformities. NEURO:  alert and oriented x 3, no obvious focal deficit  ASSESSMENT & PLAN:    1.  Coronary artery disease involving native coronary arteries without angina: She is overall doing well with no anginal symptoms.  I recommend continuing medical therapy.  2.  Hyperlipidemia: Continue treatment with atorvastatin.  I reviewed her recent labs which were unremarkable.  LDL was 64 with a triglyceride of 128.    3.  Tobacco use: I again discussed with her the importance of smoking cessation.  She reports inability to quit at the present time due to significant stress.  4.  Essential hypertension: Blood pressure is elevated but she has not been taking lisinopril.  I refilled this medication and I asked her to resume.  Continue carvedilol  COVID-19 Education: The signs and symptoms of COVID-19 were discussed with the patient and how to seek care for testing (follow up with PCP or arrange E-visit).  The importance of social distancing was discussed today.  Time:   Today, I have spent 10 minutes with the patient with telehealth technology discussing the above problems.     Medication  Adjustments/Labs and Tests Ordered: Current medicines are reviewed at length with the patient today.  Concerns regarding medicines are outlined above.   Tests Ordered: No orders of the defined types were placed in this encounter.   Medication Changes: No orders of the defined types were placed in this encounter.   Follow Up:  In Person in 6 month(s)  Signed, Kathlyn Sacramento, MD  01/24/2019 9:32 AM    [place]

## 2019-01-27 ENCOUNTER — Encounter: Payer: Self-pay | Admitting: Internal Medicine

## 2019-01-27 DIAGNOSIS — D696 Thrombocytopenia, unspecified: Secondary | ICD-10-CM | POA: Insufficient documentation

## 2019-01-27 NOTE — Assessment & Plan Note (Signed)
On lipitor

## 2019-01-27 NOTE — Assessment & Plan Note (Signed)
Controlled.  

## 2019-01-27 NOTE — Assessment & Plan Note (Signed)
Slightly decreased platelet count.  Discussed.  Consider abdominal evaluation to determine spleen size.

## 2019-01-27 NOTE — Assessment & Plan Note (Signed)
On lipitor.  Low cholesterol diet and exercise.  Follow lipid panel and liver function tests.   Lab Results  Component Value Date   CHOL 127 01/21/2019   HDL 37.50 (L) 01/21/2019   LDLCALC 64 01/21/2019   LDLDIRECT 71 10/18/2017   TRIG 128.0 01/21/2019   CHOLHDL 3 01/21/2019

## 2019-01-27 NOTE — Assessment & Plan Note (Signed)
Blood pressure varying.  Not taking lisinopril.  Plans to restart.  Follow pressures.  Follow metabolic panel.

## 2019-01-27 NOTE — Assessment & Plan Note (Addendum)
History of left wrist fracture.  Saw ortho.  Pain is better.  Desires no further intervention.  Schedule bone density.

## 2019-01-27 NOTE — Assessment & Plan Note (Signed)
On thyroid replacement.  Follow tsh.  

## 2019-01-27 NOTE — Assessment & Plan Note (Signed)
a1c improved 8.3 on recent check - down from 11.2.  Sugar checks as outlined.  Not taking jardiance.  Plans to restart.  On tresiba.  Discussed low carb diet and exercise.  Follow met b and a1c.  Discussed the need for eye exam.  She will call to schedule.

## 2019-02-14 ENCOUNTER — Ambulatory Visit: Payer: Self-pay | Admitting: Pharmacist

## 2019-02-14 ENCOUNTER — Telehealth: Payer: Self-pay | Admitting: Internal Medicine

## 2019-02-14 ENCOUNTER — Other Ambulatory Visit: Payer: Self-pay

## 2019-02-14 ENCOUNTER — Telehealth: Payer: Medicare Other

## 2019-02-14 MED ORDER — LEVOTHYROXINE SODIUM 100 MCG PO TABS: 100.0000 ug | ORAL_TABLET | Freq: Every day | ORAL | 1 refills | Status: DC

## 2019-02-14 NOTE — Telephone Encounter (Signed)
Pt called needing a refill for levothyroxine (SYNTHROID) 100 MCG tablet. Pt has no more pills. Please advise and Thank you!  Pharmacy is [facility], [address] [facility]  Call pt @ [phone].

## 2019-02-14 NOTE — Telephone Encounter (Signed)
Rx sent in

## 2019-02-14 NOTE — Progress Notes (Signed)
Reviewed.  Agree with plan   Dr Saanvika Vazques 

## 2019-02-14 NOTE — Chronic Care Management (AMB) (Signed)
  Chronic Care Management   Note  02/14/2019 Name: [name] MRN: [id number] DOB: 1945/08/02  [name] is a 74 y.o. year old female who is a primary care patient of Einar Pheasant, MD. The CCM team was consulted for assistance with chronic disease management and care coordination needs.    Attempted to contact patient for medication management review. Left HIPAA compliant message for her to return my call at her convenience.   Follow up plan: - Will collaborate w/ Care Guide to schedule follow up with me   [name], PharmD, [place], [place] Pharmacist [address] [place] [phone]

## 2019-02-19 ENCOUNTER — Encounter: Payer: Self-pay | Admitting: Internal Medicine

## 2019-02-20 ENCOUNTER — Telehealth: Payer: Self-pay | Admitting: Internal Medicine

## 2019-02-20 NOTE — Chronic Care Management (AMB) (Signed)
  Care Management   Note  02/20/2019 Name: SHAKARI MARSE MRN: [id number] DOB: 1945/12/26  [name] is a 74 y.o. year old female who is a primary care patient of Einar Pheasant, MD and is actively engaged with the care management team. I reached out to [name] by phone today to assist with re-scheduling a follow up appointment with the Pharmacist  Follow up plan: The care management team will reach out to the patient again over the next 7 days.   [place], [place] [address] Direct Dial: [email or web address]  Website: [email or web address]

## 2019-02-20 NOTE — Chronic Care Management (AMB) (Signed)
  Care Management   Note  02/20/2019 Name: KAYLINE WALLIS MRN: [id number] DOB: 1945/03/14  [name] is a 74 y.o. year old female who is a primary care patient of Einar Pheasant, MD and is actively engaged with the care management team. I reached out to [name] by phone today to assist with re-scheduling a follow up appointment with the Pharmacist  Follow up plan: Telephone appointment with care management team member scheduled for: 01/28/202  [place], [place] Management  [place], [place] [address] Direct Dial: [email or web address]  Website: [email or web address]

## 2019-03-07 ENCOUNTER — Ambulatory Visit (INDEPENDENT_AMBULATORY_CARE_PROVIDER_SITE_OTHER): Payer: Medicare Other | Admitting: Pharmacist

## 2019-03-07 ENCOUNTER — Other Ambulatory Visit: Payer: Self-pay | Admitting: Pharmacy Technician

## 2019-03-07 DIAGNOSIS — E1165 Type 2 diabetes mellitus with hyperglycemia: Secondary | ICD-10-CM

## 2019-03-07 MED ORDER — JARDIANCE 25 MG PO TABS: 25.0000 mg | ORAL_TABLET | Freq: Every day | ORAL | 1 refills | Status: DC

## 2019-03-07 NOTE — Chronic Care Management (AMB) (Addendum)
Chronic Care Management   Follow Up Note   03/07/2019 Name: Ann Collins MRN: [id number] DOB: 10/24/1945  Referred by: Einar Pheasant, MD Reason for referral : Chronic Care Management (Medication Management)   Ann Collins is a 74 y.o. year old female who is a primary care patient of Einar Pheasant, MD. The CCM team was consulted for assistance with chronic disease management and care coordination needs.    Contacted patient for medication management review.   Review of patient status, including review of consultants reports, relevant laboratory and other test results, and collaboration with appropriate care team members and the patient's provider was performed as part of comprehensive patient evaluation and provision of chronic care management services.    SDOH (Social Determinants of Health) screening performed today: Financial Strain . See Care Plan for related entries.   Outpatient Encounter Medications as of 03/07/2019  Medication Sig Note  . carvedilol (COREG) 3.125 MG tablet TAKE 1 TABLET BY MOUTH TWICE DAILY   . insulin degludec (TRESIBA) 100 UNIT/ML SOPN FlexTouch Pen Inject 0.14 mLs (14 Units total) into the skin daily. (Patient taking differently: Inject 10 Units into the skin daily. ) 03/07/2019: Taking 8 units daily   . lisinopril (ZESTRIL) 20 MG tablet Take 1 tablet (20 mg total) by mouth daily.   . [DISCONTINUED] empagliflozin (JARDIANCE) 10 MG TABS tablet Take 10 mg by mouth daily.   Marland Kitchen aspirin 81 MG tablet Take 81 mg by mouth daily.   Marland Kitchen atorvastatin (LIPITOR) 40 MG tablet TAKE 1 TABLET BY MOUTH EVERY DAY   . cyanocobalamin 1000 MCG tablet Take 1,000 mcg by mouth as needed. Takes occasionally   . empagliflozin (JARDIANCE) 25 MG TABS tablet Take 25 mg by mouth daily before breakfast.   . Glucosamine 500 MG CAPS Take 1 capsule by mouth as needed. 09/20/2018: Taking for wrist pain  . glucose blood test strip Use as instructed to blood sugar twice daily   . Insulin Pen  Needle 32G X 6 MM MISC To fit TRESIBA PEN   . levothyroxine (SYNTHROID) 100 MCG tablet Take 1 tablet (100 mcg total) by mouth daily.   . pantoprazole (PROTONIX) 40 MG tablet Take 40 mg by mouth as needed.     No facility-administered encounter medications on file as of 03/07/2019.     Objective:   Goals Addressed            This Visit's Progress     Patient Stated   . "I need to get my blood sugar better controlled" (pt-stated)       Current Barriers:  . Diabetes: uncontrolled though improved per SMBG; most recent A1c 8.3% o Reports significant familial stress lately with unexpected death of her son, and her daughter having a difficult time with the loss . Current antihyperglycemic regimen: Tresiba 8-10 units daily, Jardiance 10 mg daily o Notes that she stopped metformin back in summer of 2019 because "they told me to stop it at the hospital". No documented reason why, metformin was removed from her medication list in 07/2017 d/t patient not taking. . Denies hypoglycemia . Current blood glucose readings:  o Reports fastings in 120s if she takes [place] 10 units, in 130s if 8 units  . Cardiovascular risk reduction: o Current hypertensive regimen: lisinopril 20 mg daily, carvedilol 3.125 mg BID; SBP: 120-130s o Current hyperlipidemia regimen: atorvastatin 40 mg daily; last LDL at goal <70;   Pharmacist Clinical Goal(s):  Marland Kitchen Over the next 90 days, patient with work  with PharmD and primary care provider to address optimized glycemic management  Interventions: . Increase Jardiance to 25 mg daily. May be able to d/c insulin therapy. For now, continue [place] 8 units daily; may use 6 units if hypoglycemia develops. She will use 2 of the 10 mg tablets of Jardiance to use her current supply before ordering a refill from BI.  . May be able to d/c insulin moving forward, however, due to low supply of Tresiba at home, will go ahead and start the process to reapply . Moving forward, could  reconsider initiation of metformin.  . Praised improvement in BP w/ restarting lisinopril. Encouraged adherence.   Patient Self Care Activities:  . Patient will check blood glucose BID-TID, document, and provide at future appointments . Patient will take medications as prescribed . Patient will contact provider with any episodes of hypoglycemia . Patient will report any questions or concerns to provider  . Patient will collaborate on completion of patient assistance application  Please see past updates related to this goal by clicking on the "Past Updates" button in the selected goal          Plan:  - Scheduled f/u call 04/04/19  [name], PharmD, BCACP, [place] Pharmacist [place] [phone]

## 2019-03-07 NOTE — Patient Outreach (Signed)
[place] [facility]) Care Management  03/07/2019  Ann Collins December 24, 1945 [id number]                                       Medication Assistance Referral  Referral From: [facility] Embedded RPh [name] T.   Medication/Company: Tyler Aas / [employer] Patient application portion:  Mailed Provider application portion:  N/A Embedded [facility] RPh [name] to have signed while in clinic to Dr. Einar Pheasant Provider address/fax verified via: Office website   Follow up:  Will follow up with patient in 5-15 business days to confirm application(s) have been received.  Coady Train P. Caroljean Monsivais, [place] Management [phone]

## 2019-03-07 NOTE — Patient Instructions (Addendum)
Visit Information  Goals Addressed            This Visit's Progress     Patient Stated   . "I need to get my blood sugar better controlled" (pt-stated)       Current Barriers:  . Diabetes: uncontrolled though improved per SMBG; most recent A1c 8.3% o Reports significant familial stress lately with unexpected death of her son, and her daughter having a difficult time with the loss . Current antihyperglycemic regimen: Tresiba 8-10 units daily, Jardiance 10 mg daily o Notes that she stopped metformin back in summer of 2019 because "they told me to stop it at the hospital". No documented reason why, metformin was removed from her medication list in 07/2017 d/t patient not taking. . Denies hypoglycemia . Current blood glucose readings:  o Reports fastings in 120s if she takes [place] 10 units, in 130s if 8 units  . Cardiovascular risk reduction: o Current hypertensive regimen: lisinopril 20 mg daily, carvedilol 3.125 mg BID; SBP: 120-130s o Current hyperlipidemia regimen: atorvastatin 40 mg daily; last LDL at goal <70;   Pharmacist Clinical Goal(s):  Marland Kitchen Over the next 90 days, [name] and primary care provider to address optimized glycemic management  Interventions: . Increase Jardiance to 25 mg daily. May be able to d/c insulin therapy. For now, continue [place] 8 units daily; may use 6 units if hypoglycemia develops. She will use 2 of the 10 mg tablets of Jardiance to use her current supply before ordering a refill from BI.  . May be able to d/c insulin moving forward, however, due to low supply of Tresiba at home, will go ahead and start the process to reapply . Moving forward, could reconsider initiation of metformin.  . Praised improvement in BP w/ restarting lisinopril. Encouraged adherence.   Patient Self Care Activities:  . Patient will check blood glucose BID-TID, document, and provide at future appointments . Patient will take medications as  prescribed . Patient will contact provider with any episodes of hypoglycemia . Patient will report any questions or concerns to provider  . Patient will collaborate on completion of patient assistance application  Please see past updates related to this goal by clicking on the "Past Updates" button in the selected goal         The patient verbalized understanding of instructions provided today and declined a print copy of patient instruction materials.   Plan:  - Scheduled f/u call 04/04/19  [name], PharmD, BCACP, CPP Clinical Pharmacist [place] [phone]

## 2019-03-08 NOTE — Progress Notes (Signed)
Reviewed information.  Agree with plan.    Dr Edmon Magid 

## 2019-03-09 DIAGNOSIS — Z23 Encounter for immunization: Secondary | ICD-10-CM | POA: Diagnosis not present

## 2019-03-15 ENCOUNTER — Other Ambulatory Visit: Payer: Self-pay | Admitting: Pharmacy Technician

## 2019-03-15 NOTE — Patient Outreach (Signed)
[facility]) Care Management  03/15/2019  Ann Collins [birth year] [id number]     Unsuccessful call placed to patient regarding patient assistance application(s) for [place] with [employer] , HIPAA compliant voicemail left.   Was calling patient to inquire if she has received the application that was mailed to her on 03/08/2019.  Follow up:  Will follow up with 2nd outreach attempt in 3-7 business days.  Chealsea Paske P. Oluwakemi Salsberry, [place] Management [phone]

## 2019-03-18 ENCOUNTER — Other Ambulatory Visit: Payer: Self-pay | Admitting: Physician Assistant

## 2019-03-18 ENCOUNTER — Other Ambulatory Visit: Payer: Self-pay | Admitting: Pharmacy Technician

## 2019-03-18 NOTE — Patient Outreach (Signed)
[name]) Care Management  03/18/2019  Ann Collins 03-11-1945 [id number]  Care coordination call placed to [place] in regards to [facility] application for [place].  Spoke to [place] and inquired about the Jardiance refill and the increase in strength. [name] informs he see the application for the increased dose and does not understand why [facility] did not request the refill at the start of the year due to the change in dose. He informed that the requests for refills have to come from [facility]. Caleb then transferred me to [facility] where I spoke to [place]. Informed [name] that patient was in need of a refill due to the increase in dose. [name] informed he would process the refill request today for the 25mg  Jardiance and that it would be delivered to the patient in the next 7-10 business days.  Will outreach patient with this information.  Ann Ziemba P. Branon Sabine, [place] Management [phone]

## 2019-03-18 NOTE — Patient Outreach (Signed)
[facility]) Care Management  03/18/2019  Ann Collins 1945/06/03 [id number]   ADDENDUM   Successful call placed to patient regarding patient assistance medication delivery of Jardiance with [facility], HIPAA identifiers verified.   Informed patient that I placed her refill for Jardiance 25mg  with [facility] and that it should be delivered to her home in 7-10 business days. Patient verbalized understanding.   Zackrey Dyar P. Bronda Alfred, [place] Management [phone]

## 2019-03-18 NOTE — Patient Outreach (Signed)
[facility]) Care Management  03/18/2019  Ann Collins 09-18-1945 [id number]   Incoming call from patient regarding patient assistance application(s) for [place] with [employer] , HIPAA identifiers verified.   Patient informed she received the application and mailed it back last week.  She also informed that she had been meaning to call embedded [place] because she was running low on Jardiance as she was doubling up the 10mg . She informed she told the pharmacist that she had 2 bottles when actually she only had 1 bottle.   Follow up:  Will call [facility] and request a refill for her [name] and will call patient back.  Ann Collins, [place] Management [phone]

## 2019-03-26 ENCOUNTER — Other Ambulatory Visit: Payer: Self-pay | Admitting: Pharmacy Technician

## 2019-03-26 NOTE — Patient Outreach (Signed)
[facility]) Care Management  03/26/2019  ANASTASYA WHITEHILL 1945/05/18 [id number]    Received both patient and provider portion(s) of patient assistance application(s) for [place]. Faxed completed application and required documents into [employer].  Will follow up with company(ies) in 7-10 business days to check status of application(s).  Ramonita Koenig P. Jakiah Bienaime, [place] Management [phone]

## 2019-04-02 ENCOUNTER — Other Ambulatory Visit: Payer: Self-pay | Admitting: Pharmacy Technician

## 2019-04-02 NOTE — Patient Outreach (Signed)
[facility] Care Management  04/02/2019  NYLAH WASH 1945-08-31 [id number]    Care coordination call placed to [place] to patient's [place] application.  Spoke to [place] who informed patient was APPROVED 03/28/2019-01/07/2020. She informed a request was made to the pharmacy on 03/28/2019. Shipment should arrive at the provider's office in 10-14 business days. However, shipping delays have been occurring due to weather related events.  Will follow up with patient in 14-21 business days to confirm receipt of medication.  Lashunda Greis P. Charmel Pronovost, [place] Management [phone]

## 2019-04-02 NOTE — Patient Outreach (Signed)
[facility]) Care Management  04/02/2019  Ann Collins 02-13-1945 [id number]     Incoming call from patient regarding patient assistance medication delivery of Jardiance with BI, HIPAA identifiers verified.   Patient informed she was about to run out of Jardiance and wanted to check the status of the refill. Provided patient the phone number to [facility] as a request was made to them on 03/18/2019. Patient verbalized understanding.   Patient also called to inquire about the status of the [employer] application for [place]. Informed patient that her application was still processing and I would update her when a determination has been made.  Reached out to [place], to inquire when patient's Jardiance would arrive. Spoke to [place] who informed it is scheduled to be delivered on 04/04/2019. The tracking number with [employer] is [id number].  Follow up:  Will follow up with [employer] as previously scheduled.  Lysette Lindenbaum P. Maite Burlison, [place] Management [phone]

## 2019-04-04 ENCOUNTER — Ambulatory Visit: Payer: Self-pay | Admitting: Pharmacist

## 2019-04-04 ENCOUNTER — Telehealth: Payer: Medicare Other

## 2019-04-04 NOTE — Progress Notes (Signed)
Reviewed.  Agree with plan   Dr Baruc Tugwell 

## 2019-04-04 NOTE — Chronic Care Management (AMB) (Signed)
  Chronic Care Management   Note  04/04/2019 Name: Ann Collins MRN: [id number] DOB: Feb 26, 1945  Ann Collins is a 74 y.o. year old female who is a primary care patient of Einar Pheasant, MD. The CCM team was consulted for assistance with chronic disease management and care coordination needs.    Contacted patient for medication management review. Left HIPAA compliant message for patient to return my call at her convenience.   Follow up plan: - Will collaborate w/ Care Guide to outreach to schedule follow up with me   [name], PharmD, [place], [place] Pharmacist [place] [phone]

## 2019-04-05 ENCOUNTER — Telehealth: Payer: Self-pay | Admitting: Internal Medicine

## 2019-04-05 NOTE — Chronic Care Management (AMB) (Signed)
°  Care Management   Note  04/05/2019 Name: [name] MRN: [id number] DOB: 1945-10-10  Ann Collins is a 74 y.o. year old female who is a primary care patient of Einar Pheasant, MD and is actively engaged with the care management team. I reached out to Ann Collins by phone today to assist with re-scheduling a follow up visit with the Pharmacist  Follow up plan: Telephone appointment with care management team member scheduled for:05/27/2019  Noreene Larsson, [place], [place], [place] [address] Direct Dial: [phone] [email or web address] Website: [email or web address]

## 2019-04-06 DIAGNOSIS — Z23 Encounter for immunization: Secondary | ICD-10-CM | POA: Diagnosis not present

## 2019-04-19 ENCOUNTER — Ambulatory Visit: Payer: Self-pay | Admitting: Pharmacist

## 2019-04-19 ENCOUNTER — Telehealth: Payer: Self-pay | Admitting: *Deleted

## 2019-04-19 DIAGNOSIS — E1165 Type 2 diabetes mellitus with hyperglycemia: Secondary | ICD-10-CM

## 2019-04-19 MED ORDER — INSULIN DEGLUDEC 100 UNIT/ML ~~LOC~~ SOPN: 8.0000 [IU] | PEN_INJECTOR | Freq: Every day | SUBCUTANEOUS | 1 refills | Status: DC

## 2019-04-19 NOTE — Chronic Care Management (AMB) (Signed)
  Chronic Care Management   Note  04/19/2019 Name: Ann Collins MRN: [id number] DOB: Jul 05, 1945  [name] is a 74 y.o. year old female who is a primary care patient of Einar Pheasant, MD. The CCM team was consulted for assistance with chronic disease management and care coordination needs.    Received message from clinic RN that patient's sample Tyler Aas was in, but that sig needed to be updated in the chart for labeling. This has been completed.   Follow up plan: - Will outreach patient as previously scheduled  [name], PharmD, [place], [place] Pharmacist [place] [phone]

## 2019-04-19 NOTE — Telephone Encounter (Signed)
Error

## 2019-04-20 NOTE — Progress Notes (Signed)
Reviewed.  Agree with plan   Dr Jesper Stirewalt 

## 2019-04-22 ENCOUNTER — Other Ambulatory Visit: Payer: Self-pay | Admitting: Pharmacy Technician

## 2019-04-22 NOTE — Patient Outreach (Signed)
[facility]) Care Management  04/22/2019  CHERUB WOHLERT [birth year] [id number]   Unsuccessful call placed to patient regarding patient assistance medication delivery of Tresiba from [employer], HIPAA compliant voicemail left.   Unfortunately patient did not answer the phone.  According to [employer] system, the tracking number is [id number] and medication was shipped on 04/18/2019. According to tracking number, medication was delivered 04/19/2019.  Follow up:  Will route note to embedded [place] for assistance. In basket message sent to Kerin Salen, RN at [facility] to inquire about the shipment. Will follow up with patient in 3-7 business days if call is not returned.  Valor [place] P. Jeanann Balinski, [place]  [phone]

## 2019-04-23 ENCOUNTER — Other Ambulatory Visit: Payer: Self-pay | Admitting: Pharmacy Technician

## 2019-04-23 ENCOUNTER — Other Ambulatory Visit (INDEPENDENT_AMBULATORY_CARE_PROVIDER_SITE_OTHER): Payer: Medicare Other

## 2019-04-23 ENCOUNTER — Other Ambulatory Visit: Payer: Self-pay

## 2019-04-23 DIAGNOSIS — E7800 Pure hypercholesterolemia, unspecified: Secondary | ICD-10-CM

## 2019-04-23 DIAGNOSIS — D696 Thrombocytopenia, unspecified: Secondary | ICD-10-CM | POA: Diagnosis not present

## 2019-04-23 DIAGNOSIS — E1165 Type 2 diabetes mellitus with hyperglycemia: Secondary | ICD-10-CM

## 2019-04-23 LAB — CBC WITH DIFFERENTIAL/PLATELET
Basophils Absolute: 0.1 10*3/uL (ref 0.0–0.1)
Basophils Relative: 1.2 % (ref 0.0–3.0)
Eosinophils Absolute: 0.3 10*3/uL (ref 0.0–0.7)
Eosinophils Relative: 3.6 % (ref 0.0–5.0)
HCT: 41 % (ref 36.0–46.0)
Hemoglobin: 13.4 g/dL (ref 12.0–15.0)
Lymphocytes Relative: 46.3 % — ABNORMAL HIGH (ref 12.0–46.0)
Lymphs Abs: 3.6 10*3/uL (ref 0.7–4.0)
MCHC: 32.8 g/dL (ref 30.0–36.0)
MCV: 89.3 fl (ref 78.0–100.0)
Monocytes Absolute: 0.4 10*3/uL (ref 0.1–1.0)
Monocytes Relative: 5.6 % (ref 3.0–12.0)
Neutro Abs: 3.3 10*3/uL (ref 1.4–7.7)
Neutrophils Relative %: 43.3 % (ref 43.0–77.0)
Platelets: 143 10*3/uL — ABNORMAL LOW (ref 150.0–400.0)
RBC: 4.59 Mil/uL (ref 3.87–5.11)
RDW: 15.8 % — ABNORMAL HIGH (ref 11.5–15.5)
WBC: 7.7 10*3/uL (ref 4.0–10.5)

## 2019-04-23 LAB — BASIC METABOLIC PANEL
BUN: 19 mg/dL (ref 6–23)
CO2: 29 mEq/L (ref 19–32)
Calcium: 9.5 mg/dL (ref 8.4–10.5)
Chloride: 105 mEq/L (ref 96–112)
Creatinine, Ser: 1.09 mg/dL (ref 0.40–1.20)
GFR: 49.15 mL/min — ABNORMAL LOW (ref >60.00)
Glucose, Bld: 136 mg/dL — ABNORMAL HIGH (ref 70–99)
Potassium: 5 mEq/L (ref 3.5–5.1)
Sodium: 141 mEq/L (ref 135–145)

## 2019-04-23 LAB — HEPATIC FUNCTION PANEL
ALT: 20 U/L (ref 0–35)
AST: 16 U/L (ref 0–37)
Albumin: 3.9 g/dL (ref 3.5–5.2)
Alkaline Phosphatase: 86 U/L (ref 39–117)
Bilirubin, Direct: 0.1 mg/dL (ref 0.0–0.3)
Total Bilirubin: 0.6 mg/dL (ref 0.2–1.2)
Total Protein: 6.8 g/dL (ref 6.0–8.3)

## 2019-04-23 LAB — LIPID PANEL
Cholesterol: 129 mg/dL (ref 0–200)
HDL: 36.4 mg/dL — ABNORMAL LOW (ref >39.00)
LDL Cholesterol: 70 mg/dL (ref 0–99)
NonHDL: 92.4
Total CHOL/HDL Ratio: 4
Triglycerides: 111 mg/dL (ref 0.0–149.0)
VLDL: 22.2 mg/dL (ref 0.0–40.0)

## 2019-04-23 LAB — HEMOGLOBIN A1C: Hgb A1c MFr Bld: 8.6 % — ABNORMAL HIGH (ref 4.6–6.5)

## 2019-04-23 NOTE — Patient Outreach (Signed)
[facility]) Care Management  04/23/2019  Ann Collins Jul 25, 1945 [id number]    Successful call placed to patient regarding patient assistance medication delivery of Tresiba from [employer], HIPAA identifiers verified.   Informed patient that her medication had arrived at the provider's office on Friday. She informed he was at the office now and would pick it up. Briefly discussed refill procedure with patient which entails provider receiving a fax when patient is due, filling out the form and faxing it back to [employer]. Informed patient to call office when she has around 2 weeks supply remaining as a reminder. Patient verbalized understanding and confirmed having name and number. Patient informed she had no other questions or concerns.  Follow up:  Will route note to embedded [facility] R[facility] [name] for case closure and will remove myself from care team as patient assistance has been completed.  Tashaya Ancrum P. Merve Hotard, [place]  [phone]

## 2019-04-23 NOTE — Progress Notes (Signed)
Patient has picked up her medications Tresiba 2 boxes 5 pens  Lot [id number]  EXP 10/08/23

## 2019-04-25 ENCOUNTER — Other Ambulatory Visit: Payer: Self-pay

## 2019-04-25 ENCOUNTER — Ambulatory Visit (INDEPENDENT_AMBULATORY_CARE_PROVIDER_SITE_OTHER): Payer: Medicare Other | Admitting: Internal Medicine

## 2019-04-25 DIAGNOSIS — Z87891 Personal history of nicotine dependence: Secondary | ICD-10-CM

## 2019-04-25 DIAGNOSIS — S62102D Fracture of unspecified carpal bone, left wrist, subsequent encounter for fracture with routine healing: Secondary | ICD-10-CM

## 2019-04-25 DIAGNOSIS — E039 Hypothyroidism, unspecified: Secondary | ICD-10-CM

## 2019-04-25 DIAGNOSIS — D696 Thrombocytopenia, unspecified: Secondary | ICD-10-CM

## 2019-04-25 DIAGNOSIS — Z0000 Encounter for general adult medical examination without abnormal findings: Secondary | ICD-10-CM

## 2019-04-25 DIAGNOSIS — K2100 Gastro-esophageal reflux disease with esophagitis, without bleeding: Secondary | ICD-10-CM

## 2019-04-25 DIAGNOSIS — E1165 Type 2 diabetes mellitus with hyperglycemia: Secondary | ICD-10-CM

## 2019-04-25 DIAGNOSIS — E7800 Pure hypercholesterolemia, unspecified: Secondary | ICD-10-CM

## 2019-04-25 DIAGNOSIS — I10 Essential (primary) hypertension: Secondary | ICD-10-CM | POA: Diagnosis not present

## 2019-04-25 DIAGNOSIS — I700 Atherosclerosis of aorta: Secondary | ICD-10-CM | POA: Diagnosis not present

## 2019-04-25 NOTE — Progress Notes (Signed)
Patient ID: Ann Collins, female   DOB: 06/10/1945, 74 y.o.   MRN: [id number]   Subjective:    Patient ID: Ann Collins, female    DOB: [birth year], 74 y.o.   MRN: [id number]  HPI This visit occurred during the SARS-CoV-2 public health emergency.  Safety protocols were in place, including screening questions prior to the visit, additional usage of staff PPE, and extensive cleaning of exam room while observing appropriate contact time as indicated for disinfecting solutions.  Patient with past history of CAD, diabetes, hypertension and hypercholesterolemia.  She comes in today to follow up on these issues as well as for a complete physical exam.  She reports she is doing relatively well.  Increased stress.  Still trying to cope with her son's death.  Discussed with her today. She does not feel needs any further intervention.  Keeping her grandson.  This helps.  Tries to stay active.  No chest pain.  No sob.  No acid reflux. No abdominal pain. Bowels moving. Still smoking.  Discussed the need to quit.  Taking jardiance 89m now.  Just started 250m  Also on tresiba.  States sugars in am 120 and pm sugars averaging 180-190.  Taking lisinopril regularly now.  Still with left wrist pain from previous injury.  Taking tylenol.  Has seen ortho.  Desires no further intervention.    Past Medical History:  Diagnosis Date  . Abdominal hernia   . CAD (coronary artery disease)    a. LHC 7/19: LM nl, p-mLAD 100% w/ right to left collaterals, OM2 90% (2 mm), 1st LPL 40%, medical management  . Diabetes mellitus ([place]  . Diverticulosis   . GERD (gastroesophageal reflux disease)   . Hematuria   . Hypercholesterolemia   . Hypertension   . Hypothyroidism   . Kidney stones    Past Surgical History:  Procedure Laterality Date  . APPENDECTOMY    . BREAST BIOPSY  1969   right  . CHOLECYSTECTOMY  2005  . COLONOSCOPY    . COLONOSCOPY WITH PROPOFOL N/A 09/19/2017   Procedure: COLONOSCOPY WITH PROPOFOL;   Surgeon: Toledo, TeBenay PikeMD;  Location: [facility] ENDOSCOPY;  Service: Gastroenterology;  Laterality: N/A;  . ESOPHAGOGASTRODUODENOSCOPY (EGD) WITH PROPOFOL N/A 09/19/2017   Procedure: ESOPHAGOGASTRODUODENOSCOPY (EGD) WITH PROPOFOL;  Surgeon: Toledo, TeBenay PikeMD;  Location: [facility] ENDOSCOPY;  Service: Gastroenterology;  Laterality: N/A;  . LEFT HEART CATH AND CORONARY ANGIOGRAPHY Left 08/07/2017   Procedure: LEFT HEART CATH AND CORONARY ANGIOGRAPHY;  Surgeon: ArWellington HampshireMD;  [facility];  Service: Cardiovascular;  Laterality: Left;  . UPPER GI ENDOSCOPY     Family History  Problem Relation Age of Onset  . Brain cancer Mother        history of brain tumor  . Lung disease Father   . Diabetes Brother        x2  . Bipolar disorder Son   . Alcohol abuse Son   . Breast cancer Neg Hx   . Colon cancer Neg Hx    Social History   Socioeconomic History  . Marital status: Married    Spouse name: Not on file  . Number of children: Not on file  . Years of education: Not on file  . Highest education level: Not on file  Occupational History  . Not on file  Tobacco Use  . Smoking status: Current Every Day Smoker    Packs/day: 1.00    Years: 50.00  Pack years: 50.00  . Smokeless tobacco: Never Used  Substance and Sexual Activity  . Alcohol use: No    Alcohol/week: 0.0 standard drinks  . Drug use: No  . Sexual activity: Not on file  Other Topics Concern  . Not on file  Social History Narrative  . Not on file   Social Determinants of Health   Financial Resource Strain: Medium Risk  . Difficulty of Paying Living Expenses: Somewhat hard  Food Insecurity:   . Worried About Charity fundraiser in the Last Year:   . Arboriculturist in the Last Year:   Transportation Needs:   . Film/video editor (Medical):   Marland Kitchen Lack of Transportation (Non-Medical):   Physical Activity: Insufficiently Active  . Days of Exercise per Week: 2 days  . Minutes of Exercise per  Session: 20 min  Stress: No Stress Concern Present  . Feeling of Stress : Not at all  Social Connections:   . Frequency of Communication with Friends and Family:   . Frequency of Social Gatherings with Friends and Family:   . Attends Religious Services:   . Active Member of Clubs or Organizations:   . Attends Archivist Meetings:   Marland Kitchen Marital Status:     Outpatient Encounter Medications as of 04/25/2019  Medication Sig  . aspirin 81 MG tablet Take 81 mg by mouth daily.  Marland Kitchen atorvastatin (LIPITOR) 40 MG tablet TAKE 1 TABLET BY MOUTH EVERY DAY  . carvedilol (COREG) 3.125 MG tablet TAKE 1 TABLET BY MOUTH TWICE DAILY  . cyanocobalamin 1000 MCG tablet Take 1,000 mcg by mouth as needed. Takes occasionally  . empagliflozin (JARDIANCE) 25 MG TABS tablet Take 25 mg by mouth daily before breakfast.  . Glucosamine 500 MG CAPS Take 1 capsule by mouth as needed.  Marland Kitchen glucose blood test strip Use as instructed to blood sugar twice daily  . insulin degludec (TRESIBA) 100 UNIT/ML FlexTouch Pen Inject 0.08 mLs (8 Units total) into the skin daily.  . Insulin Pen Needle 32G X 6 MM MISC To fit TRESIBA PEN  . levothyroxine (SYNTHROID) 100 MCG tablet Take 1 tablet (100 mcg total) by mouth daily.  Marland Kitchen lisinopril (ZESTRIL) 20 MG tablet Take 1 tablet (20 mg total) by mouth daily.  . pantoprazole (PROTONIX) 40 MG tablet Take 40 mg by mouth as needed.    No facility-administered encounter medications on file as of 04/25/2019.    Review of Systems  Constitutional: Negative for appetite change and unexpected weight change.  HENT: Negative for congestion and sinus pressure.   Eyes: Negative for pain and visual disturbance.  Respiratory: Negative for cough, chest tightness and shortness of breath.   Cardiovascular: Negative for chest pain, palpitations and leg swelling.  Gastrointestinal: Negative for abdominal pain, diarrhea, nausea and vomiting.  Genitourinary: Negative for difficulty urinating and dysuria.   Musculoskeletal: Negative for joint swelling and myalgias.       Persistent wrist pain as outlined.   Skin: Negative for color change and rash.  Neurological: Negative for dizziness and headaches.  Hematological: Negative for adenopathy. Does not bruise/bleed easily.  Psychiatric/Behavioral: Negative for agitation and dysphoric mood.       Increased stress as outlined.         Objective:    Physical Exam Constitutional:      General: She is not in acute distress.    Appearance: Normal appearance. She is well-developed.  HENT:     Head: Normocephalic and atraumatic.  Right Ear: External ear normal.     Left Ear: External ear normal.  Eyes:     General: No scleral icterus.       Right eye: No discharge.        Left eye: No discharge.     Conjunctiva/sclera: Conjunctivae normal.  Neck:     Thyroid: No thyromegaly.  Cardiovascular:     Rate and Rhythm: Normal rate and regular rhythm.  Pulmonary:     Effort: No tachypnea, accessory muscle usage or respiratory distress.     Breath sounds: Normal breath sounds. No decreased breath sounds or wheezing.  Chest:     Breasts:        Right: No inverted nipple, mass, nipple discharge or tenderness (no axillary adenopathy).        Left: No inverted nipple, mass, nipple discharge or tenderness (no axilarry adenopathy).  Abdominal:     General: Bowel sounds are normal.     Palpations: Abdomen is soft.     Tenderness: There is no abdominal tenderness.  Musculoskeletal:        General: No swelling or tenderness.     Cervical back: Neck supple. No tenderness.  Lymphadenopathy:     Cervical: No cervical adenopathy.  Skin:    Findings: No erythema or rash.  Neurological:     Mental Status: She is alert and oriented to person, place, and time.  Psychiatric:        Mood and Affect: Mood normal.        Behavior: Behavior normal.     BP 132/80   Pulse 64   Temp (!) 96.9 F (36.1 C)   Resp 16   Ht _0  (1.575 m)   Wt 145 lb  (65.8 kg)   LMP 03/10/1991   SpO2 98%   BMI 26.52 kg/m  Wt Readings from Last 3 Encounters:  04/25/19 145 lb (65.8 kg)  01/24/19 144 lb (65.3 kg)  01/23/19 145 lb (65.8 kg)     Lab Results  Component Value Date   WBC 7.7 04/23/2019   HGB 13.4 04/23/2019   HCT 41.0 04/23/2019   PLT 143.0 (L) 04/23/2019   GLUCOSE 136 (H) 04/23/2019   CHOL 129 04/23/2019   TRIG 111.0 04/23/2019   HDL 36.40 (L) 04/23/2019   LDLDIRECT 71 10/18/2017   LDLCALC 70 04/23/2019   ALT 20 04/23/2019   AST 16 04/23/2019   NA 141 04/23/2019   K 5.0 04/23/2019   CL 105 04/23/2019   CREATININE 1.09 04/23/2019   BUN 19 04/23/2019   CO2 29 04/23/2019   TSH 1.52 07/12/2018   INR 0.87 08/07/2017   HGBA1C 8.6 (H) 04/23/2019   MICROALBUR <0.7 07/12/2018    MM 3D SCREEN BREAST BILATERAL  Result Date: 09/10/2018 CLINICAL DATA:  Screening. EXAM: DIGITAL SCREENING BILATERAL MAMMOGRAM WITH TOMO AND CAD COMPARISON:  Previous exam(s). ACR Breast Density Category b: There are scattered areas of fibroglandular density. FINDINGS: There are no findings suspicious for malignancy. Images were processed with CAD. IMPRESSION: No mammographic evidence of malignancy. A result letter of this screening mammogram will be mailed directly to the patient. RECOMMENDATION: Screening mammogram in one year. (Code:SM-B-01Y) BI-RADS CATEGORY  1: Negative. Electronically Signed   By: Lillia Mountain M.D.   On: 09/10/2018 13:48       Assessment & Plan:   Problem List Items Addressed This Visit    Aortic atherosclerosis ([place])    On lipitor.       Diabetes mellitus (  HCC)    a1c elevated.  Sugars as outlined.  Just started new dose of jardiance. Continue 13m.  Continue tresiba.  Check and record sugars and send in readings.  Continue to adjust medication as needed. Follow met b and a1c. Discussed low carb diet and exercise.        GERD (gastroesophageal reflux disease)    Controlled on protonix. No problems reported.       Health care  maintenance    Physical today 04/25/19.  Colonoscopy 09/2017 - tubular adenoma (mid sigmoid) and hyperplastic polyp (sigmoid).  Recommended f/u colonoscopy in 5 years.  Mammogram 09/10/18 - Birads I.       Hypercholesterolemia    On lipitor.  Low cholesterol diet and exercise.  Follow lipid panel.   Lab Results  Component Value Date   CHOL 129 04/23/2019   HDL 36.40 (L) 04/23/2019   LDLCALC 70 04/23/2019   LDLDIRECT 71 10/18/2017   TRIG 111.0 04/23/2019   CHOLHDL 4 04/23/2019        Hypertension    Blood pressure on recheck better.  Taking lisinopril regularly now.  Continue.  Follow pressures.  Follow metabolic panel.       Hypothyroidism    On thyroid replacement.  Follow tsh       Left wrist fracture    History of wrist fracture as outlined.  Saw ortho.  Taking tylenol.  Discussed f/u with ortho.  Wants to hold.       Personal history of tobacco use, presenting hazards to health    Have discussed the need to quit smoking.  Declines to quit at this time.  Follow.       Thrombocytopenia (HCC)    Platelet count slightly decreased on recent check.  Recheck platelet count.        Relevant Orders   CBC with Differential/Platelet       CEinar Pheasant MD

## 2019-04-29 ENCOUNTER — Encounter: Payer: Self-pay | Admitting: Internal Medicine

## 2019-04-29 NOTE — Assessment & Plan Note (Signed)
Controlled on protonix. No problems reported.

## 2019-04-29 NOTE — Assessment & Plan Note (Signed)
History of wrist fracture as outlined.  Saw ortho.  Taking tylenol.  Discussed f/u with ortho.  Wants to hold.

## 2019-04-29 NOTE — Assessment & Plan Note (Signed)
On lipitor

## 2019-04-29 NOTE — Assessment & Plan Note (Signed)
Platelet count slightly decreased on recent check.  Recheck platelet count.

## 2019-04-29 NOTE — Assessment & Plan Note (Signed)
On thyroid replacement.  Follow tsh.  

## 2019-04-29 NOTE — Assessment & Plan Note (Signed)
Have discussed the need to quit smoking.  Declines to quit at this time.  Follow.  ?

## 2019-04-29 NOTE — Assessment & Plan Note (Signed)
Physical today 04/25/19.  Colonoscopy 09/2017 - tubular adenoma (mid sigmoid) and hyperplastic polyp (sigmoid).  Recommended f/u colonoscopy in 5 years.  Mammogram 09/10/18 - Birads I.

## 2019-04-29 NOTE — Assessment & Plan Note (Signed)
a1c elevated.  Sugars as outlined.  Just started new dose of jardiance. Continue 47m.  Continue tresiba.  Check and record sugars and send in readings.  Continue to adjust medication as needed. Follow met b and a1c. Discussed low carb diet and exercise.

## 2019-04-29 NOTE — Assessment & Plan Note (Signed)
On lipitor.  Low cholesterol diet and exercise.  Follow lipid panel.   Lab Results  Component Value Date   CHOL 129 04/23/2019   HDL 36.40 (L) 04/23/2019   LDLCALC 70 04/23/2019   LDLDIRECT 71 10/18/2017   TRIG 111.0 04/23/2019   CHOLHDL 4 04/23/2019

## 2019-04-29 NOTE — Assessment & Plan Note (Signed)
Blood pressure on recheck better.  Taking lisinopril regularly now.  Continue.  Follow pressures.  Follow metabolic panel.

## 2019-05-21 ENCOUNTER — Other Ambulatory Visit (INDEPENDENT_AMBULATORY_CARE_PROVIDER_SITE_OTHER): Payer: Medicare Other

## 2019-05-21 ENCOUNTER — Other Ambulatory Visit: Payer: Self-pay

## 2019-05-21 DIAGNOSIS — D696 Thrombocytopenia, unspecified: Secondary | ICD-10-CM | POA: Diagnosis not present

## 2019-05-21 LAB — CBC WITH DIFFERENTIAL/PLATELET
Basophils Absolute: 0.1 10*3/uL (ref 0.0–0.1)
Basophils Relative: 1.3 % (ref 0.0–3.0)
Eosinophils Absolute: 0.3 10*3/uL (ref 0.0–0.7)
Eosinophils Relative: 4.3 % (ref 0.0–5.0)
HCT: 42 % (ref 36.0–46.0)
Hemoglobin: 13.6 g/dL (ref 12.0–15.0)
Lymphocytes Relative: 45.4 % (ref 12.0–46.0)
Lymphs Abs: 3.3 10*3/uL (ref 0.7–4.0)
MCHC: 32.3 g/dL (ref 30.0–36.0)
MCV: 90.7 fl (ref 78.0–100.0)
Monocytes Absolute: 0.4 10*3/uL (ref 0.1–1.0)
Monocytes Relative: 5.8 % (ref 3.0–12.0)
Neutro Abs: 3.1 10*3/uL (ref 1.4–7.7)
Neutrophils Relative %: 43.2 % (ref 43.0–77.0)
Platelets: 123 10*3/uL — ABNORMAL LOW (ref 150.0–400.0)
RBC: 4.63 Mil/uL (ref 3.87–5.11)
RDW: 15.8 % — ABNORMAL HIGH (ref 11.5–15.5)
WBC: 7.3 10*3/uL (ref 4.0–10.5)

## 2019-05-22 ENCOUNTER — Telehealth: Payer: Self-pay | Admitting: Internal Medicine

## 2019-05-22 ENCOUNTER — Other Ambulatory Visit: Payer: Self-pay | Admitting: Internal Medicine

## 2019-05-22 DIAGNOSIS — D696 Thrombocytopenia, unspecified: Secondary | ICD-10-CM

## 2019-05-22 NOTE — Progress Notes (Signed)
Order placed for f/u platelet count and abdominal ultrasound.

## 2019-05-22 NOTE — Chronic Care Management (AMB) (Signed)
  Care Management   Note  05/22/2019 Name: [name] MRN: [id number] DOB: 1945/07/30  Ann Collins is a 74 y.o. year old female who is a primary care patient of Einar Pheasant, MD and is actively engaged with the care management team. I reached out to Ann Collins by phone today to assist with re-scheduling a follow up visit with the Pharmacist  Follow up plan: Telephone appointment with care management team member scheduled for:05/27/2019  Noreene Larsson, [place], [place], [place] [address] Direct Dial: [phone] [email or web address] Website: [email or web address]

## 2019-05-22 NOTE — Progress Notes (Signed)
Order placed for f/u lab.   

## 2019-05-27 ENCOUNTER — Ambulatory Visit (INDEPENDENT_AMBULATORY_CARE_PROVIDER_SITE_OTHER): Payer: Medicare Other | Admitting: Pharmacist

## 2019-05-27 ENCOUNTER — Telehealth: Payer: Medicare Other

## 2019-05-27 DIAGNOSIS — E1165 Type 2 diabetes mellitus with hyperglycemia: Secondary | ICD-10-CM | POA: Diagnosis not present

## 2019-05-27 MED ORDER — INSULIN DEGLUDEC 100 UNIT/ML ~~LOC~~ SOPN: 10.0000 [IU] | PEN_INJECTOR | Freq: Every day | SUBCUTANEOUS | 1 refills | Status: DC

## 2019-05-27 NOTE — Progress Notes (Signed)
Reviewed information.  Agree with plan.    Dr Cuba Natarajan 

## 2019-05-27 NOTE — Patient Instructions (Signed)
Visit Information  Goals Addressed            This Visit's Progress     Patient Stated   . "I need to get my blood sugar better controlled" (pt-stated)       CARE PLAN ENTRY (see longtitudinal plan of care for additional care plan information)  Current Barriers:  . Diabetes: uncontrolled though improved per [employer]; most recent A1c 8.6% o Reports more stress recently. Daughter is having a hard time s/p the loss of patient's son, patient is doing a lot of babysitting of her grandson. . Last eGFR ~49  . Current antihyperglycemic regimen: Tresiba 8 units daily, Jardiance 25 mg daily o Notes that she stopped metformin back in summer of 2019 because "they told me to stop it at the hospital" No documented reason why, metformin was removed from her medication list in 07/2017 d/t patient not taking. . Denies hypoglycemia . Current blood glucose readings:  o Fasting: this morning was 150; usually 120-130s o 2 hours after meals: 120-130s  . Cardiovascular risk reduction: o Current hypertensive regimen: lisinopril 20 mg daily, carvedilol 3.125 mg BID; SBP 120s-140/70s o Current hyperlipidemia regimen: atorvastatin 40 mg daily; last LDL at goal 70 o Current antiplatelet regimen: ASA 81 mg daily - though notes that she often forgets to take this . Acid reflux: pantoprazole 40 mg PRN; reports that this PRN use is working for her symptoms . Wrist inflammation/pain: glucosamine PRN, reports that this does seem to help her symptoms . Supplements: Vitamin B12  Pharmacist Clinical Goal(s):  Marland Kitchen Over the next 90 days, [name] and primary care provider to address optimized glycemic management  Interventions: . Comprehensive medication review performed, medication list updated in electronic medical record . Inter-disciplinary care team collaboration (see longitudinal plan of care) . Increase Tresiba to 10 units daily to target slightly elevating fastings. Patient verbalizes  understanding . Reviewed importance of adherence to ASA therapy for primary prevention . Reviewed refill hx. Patient up to date on statin and RAAS fills  Patient Self Care Activities:  . Patient will check blood glucose BID-TID, document, and provide at future appointments . Patient will take medications as prescribed . Patient will contact provider with any episodes of hypoglycemia . Patient will report any questions or concerns to provider  . Patient will collaborate on completion of patient assistance application  Please see past updates related to this goal by clicking on the "Past Updates" button in the selected goal         Patient verbalizes understanding of instructions provided today.   Plan:  - Scheduled f/u call 07/11/19  [name], PharmD, BCACP, CPP Clinical Pharmacist [place] [phone]

## 2019-05-27 NOTE — Chronic Care Management (AMB) (Signed)
Chronic Care Management   Follow Up Note   05/27/2019 Name: Ann Collins MRN: [id number] DOB: Sep 15, 1945  Referred by: Einar Pheasant, MD Reason for referral : Chronic Care Management (Medication Management)   Ann Collins is a 74 y.o. year old female who is a primary care patient of Einar Pheasant, MD. The CCM team was consulted for assistance with chronic disease management and care coordination needs.    Contacted patient for medication management review.  Review of patient status, including review of consultants reports, relevant laboratory and other test results, and collaboration with appropriate care team members and the patient's provider was performed as part of comprehensive patient evaluation and provision of chronic care management services.    SDOH (Social Determinants of Health) assessments performed: No See Care Plan activities for detailed interventions related to [facility])     Outpatient Encounter Medications as of 05/27/2019  Medication Sig Note  . atorvastatin (LIPITOR) 40 MG tablet TAKE 1 TABLET BY MOUTH EVERY DAY   . carvedilol (COREG) 3.125 MG tablet TAKE 1 TABLET BY MOUTH TWICE DAILY   . cyanocobalamin 1000 MCG tablet Take 1,000 mcg by mouth as needed. Takes occasionally   . empagliflozin (JARDIANCE) 25 MG TABS tablet Take 25 mg by mouth daily before breakfast.   . Glucosamine 500 MG CAPS Take 1 capsule by mouth as needed. 09/20/2018: Taking for wrist pain  . glucose blood test strip Use as instructed to blood sugar twice daily   . insulin degludec (TRESIBA) 100 UNIT/ML FlexTouch Pen Inject 0.1 mLs (10 Units total) into the skin daily.   . Insulin Pen Needle 32G X 6 MM MISC To fit TRESIBA PEN   . levothyroxine (SYNTHROID) 100 MCG tablet Take 1 tablet (100 mcg total) by mouth daily.   Marland Kitchen lisinopril (ZESTRIL) 20 MG tablet Take 1 tablet (20 mg total) by mouth daily.   . pantoprazole (PROTONIX) 40 MG tablet Take 40 mg by mouth as needed.    . [DISCONTINUED]  insulin degludec (TRESIBA) 100 UNIT/ML FlexTouch Pen Inject 0.08 mLs (8 Units total) into the skin daily.   Marland Kitchen aspirin 81 MG tablet Take 81 mg by mouth daily.    No facility-administered encounter medications on file as of 05/27/2019.     Objective:   Goals Addressed            This Visit's Progress     Patient Stated   . "I need to get my blood sugar better controlled" (pt-stated)       CARE PLAN ENTRY (see longtitudinal plan of care for additional care plan information)  Current Barriers:  . Diabetes: uncontrolled though improved per SMBG; most recent A1c 8.6% o Reports more stress recently. Daughter is having a hard time s/p the loss of patient's son, patient is doing a lot of babysitting of her grandson. . Last eGFR ~49  . Current antihyperglycemic regimen: Tresiba 8 units daily, Jardiance 25 mg daily o Notes that she stopped metformin back in summer of 2019 because "they told me to stop it at the hospital" No documented reason why, metformin was removed from her medication list in 07/2017 d/t patient not taking. . Denies hypoglycemia . Current blood glucose readings:  o Fasting: this morning was 150; usually 120-130s o 2 hours after meals: 120-130s  . Cardiovascular risk reduction: o Current hypertensive regimen: lisinopril 20 mg daily, carvedilol 3.125 mg BID; SBP 120s-140/70s o Current hyperlipidemia regimen: atorvastatin 40 mg daily; last LDL at goal 70 o Current antiplatelet  regimen: ASA 81 mg daily - though notes that she often forgets to take this . Acid reflux: pantoprazole 40 mg PRN; reports that this PRN use is working for her symptoms . Wrist inflammation/pain: glucosamine PRN, reports that this does seem to help her symptoms . Supplements: Vitamin B12  Pharmacist Clinical Goal(s):  Marland Kitchen Over the next 90 days, [name] and primary care provider to address optimized glycemic management  Interventions: . Comprehensive medication review performed,  medication list updated in electronic medical record . Inter-disciplinary care team collaboration (see longitudinal plan of care) . Increase Tresiba to 10 units daily to target slightly elevating fastings. Patient verbalizes understanding . Reviewed importance of adherence to ASA therapy for primary prevention . Reviewed refill hx. Patient up to date on statin and RAAS fills  Patient Self Care Activities:  . Patient will check blood glucose BID-TID, document, and provide at future appointments . Patient will take medications as prescribed . Patient will contact provider with any episodes of hypoglycemia . Patient will report any questions or concerns to provider  . Patient will collaborate on completion of patient assistance application  Please see past updates related to this goal by clicking on the "Past Updates" button in the selected goal          Plan:  - Scheduled f/u call 07/11/19  [name], PharmD, BCACP, [place] Pharmacist [place] [phone]

## 2019-05-29 ENCOUNTER — Ambulatory Visit
Admission: RE | Admit: 2019-05-29 | Discharge: 2019-05-29 | Disposition: A | Discharge status: Home / Self Care | Payer: Medicare Other | Source: Ambulatory Visit | Attending: Internal Medicine | Admitting: Internal Medicine

## 2019-05-29 ENCOUNTER — Other Ambulatory Visit: Payer: Self-pay

## 2019-05-29 ENCOUNTER — Other Ambulatory Visit: Payer: Self-pay | Admitting: Internal Medicine

## 2019-05-29 DIAGNOSIS — D696 Thrombocytopenia, unspecified: Secondary | ICD-10-CM | POA: Diagnosis not present

## 2019-05-29 NOTE — Progress Notes (Signed)
Opened in error

## 2019-05-31 ENCOUNTER — Other Ambulatory Visit: Payer: Self-pay | Admitting: Internal Medicine

## 2019-05-31 ENCOUNTER — Telehealth: Payer: Self-pay | Admitting: Internal Medicine

## 2019-05-31 ENCOUNTER — Ambulatory Visit (INDEPENDENT_AMBULATORY_CARE_PROVIDER_SITE_OTHER): Payer: Medicare Other

## 2019-05-31 VITALS — Ht 62.0 in | Wt 145.0 lb

## 2019-05-31 DIAGNOSIS — D3501 Benign neoplasm of right adrenal gland: Secondary | ICD-10-CM

## 2019-05-31 DIAGNOSIS — E1165 Type 2 diabetes mellitus with hyperglycemia: Secondary | ICD-10-CM

## 2019-05-31 DIAGNOSIS — E278 Other specified disorders of adrenal gland: Secondary | ICD-10-CM

## 2019-05-31 DIAGNOSIS — Z0000 Encounter for general adult medical examination without abnormal findings: Secondary | ICD-10-CM | POA: Diagnosis not present

## 2019-05-31 NOTE — Patient Instructions (Addendum)
  Ms. Haire , Thank you for taking time to come for your Medicare Wellness Visit. I appreciate your ongoing commitment to your health goals. Please review the following plan we discussed and let me know if I can assist you in the future.   These are the goals we discussed: Goals      Patient Stated   . "I need to get my blood sugar better controlled" (pt-stated)     CARE PLAN ENTRY (see longtitudinal plan of care for additional care plan information)  Current Barriers:  . Diabetes: uncontrolled though improved per SMBG; most recent A1c 8.6% o Reports more stress recently. Daughter is having a hard time s/p the loss of patient's son, patient is doing a lot of babysitting of her grandson. . Last eGFR ~49  . Current antihyperglycemic regimen: Tresiba 8 units daily, Jardiance 25 mg daily o Notes that she stopped metformin back in summer of 2019 because "they told me to stop it at the hospital" No documented reason why, metformin was removed from her medication list in 07/2017 d/t patient not taking. . Denies hypoglycemia . Current blood glucose readings:  o Fasting: this morning was 150; usually 120-130s o 2 hours after meals: 120-130s  . Cardiovascular risk reduction: o Current hypertensive regimen: lisinopril 20 mg daily, carvedilol 3.125 mg BID; SBP 120s-140/70s o Current hyperlipidemia regimen: atorvastatin 40 mg daily; last LDL at goal 70 o Current antiplatelet regimen: ASA 81 mg daily - though notes that she often forgets to take this . Acid reflux: pantoprazole 40 mg PRN; reports that this PRN use is working for her symptoms . Wrist inflammation/pain: glucosamine PRN, reports that this does seem to help her symptoms . Supplements: Vitamin B12  Pharmacist Clinical Goal(s):  Marland Kitchen Over the next 90 days, [name] and primary care provider to address optimized glycemic management  Interventions: . Comprehensive medication review performed, medication list updated  in electronic medical record . Inter-disciplinary care team collaboration (see longitudinal plan of care) . Increase Tresiba to 10 units daily to target slightly elevating fastings. Patient verbalizes understanding . Reviewed importance of adherence to ASA therapy for primary prevention . Reviewed refill hx. Patient up to date on statin and RAAS fills  Patient Self Care Activities:  . Patient will check blood glucose BID-TID, document, and provide at future appointments . Patient will take medications as prescribed . Patient will contact provider with any episodes of hypoglycemia . Patient will report any questions or concerns to provider  . Patient will collaborate on completion of patient assistance application  Please see past updates related to this goal by clicking on the "Past Updates" button in the selected goal         This is a list of the screening recommended for you and due dates:  Health Maintenance  Topic Date Due  . DEXA scan (bone density measurement)  Never done  . Eye exam for diabetics  12/13/2017  . Flu Shot  09/08/2019  . Mammogram  09/10/2019  . Complete foot exam   09/14/2019  . Hemoglobin A1C  10/24/2019  . Cologuard (Stool DNA test)  06/14/2020  . Tetanus Vaccine  10/15/2023  . COVID-19 Vaccine  Completed  .  Hepatitis C: One time screening is recommended by Center for Disease Control  (CDC) for  adults born from [birth year] through 1965.   Completed  . Pneumonia vaccines  Completed

## 2019-05-31 NOTE — Progress Notes (Addendum)
Subjective:   Ann Collins is a 74 y.o. female who presents for Medicare Annual (Subsequent) preventive examination.  Review of Systems:  No ROS.  Medicare Wellness Virtual Visit.  Visual/audio telehealth visit, UTA vital signs.   Ht/Wt provided. See social history for additional risk factors.   Cardiac Risk Factors include: advanced age (>83mn, >>3women);diabetes mellitus     Objective:     Vitals: Ht 5' 2"  (1.575 m)   Wt 145 lb (65.8 kg)   LMP 03/10/1991   BMI 26.52 kg/m   Body mass index is 26.52 kg/m.  Advanced Directives 05/31/2019 05/30/2018 09/19/2017 08/07/2017 05/12/2017  Does Patient Have a Medical Advance Directive? No Yes No No Yes  Type of Advance Directive - HKrebsLiving will - - HLodiLiving will  Does patient want to make changes to medical advance directive? - No - Patient declined - - No - Patient declined  Copy of HRosaliain Chart? - No - copy requested - - No - copy requested  Would patient like information on creating a medical advance directive? Yes (MAU/Ambulatory/Procedural Areas - Information given) - - No - Patient declined -    Tobacco Social History   Tobacco Use  Smoking Status Current Every Day Smoker  . Packs/day: 1.00  . Years: 50.00  . Pack years: 50.00  Smokeless Tobacco Never Used     Ready to quit: Not Answered Counseling given: Not Answered   Clinical Intake:  Pre-visit preparation completed: Yes        Diabetes: Yes(Followed by pcp)  How often do you need to have someone help you when you read instructions, pamphlets, or other written materials from your doctor or pharmacy?: 1 - Never  Interpreter Needed?: No     Past Medical History:  Diagnosis Date  . Abdominal hernia   . CAD (coronary artery disease)    a. LHC 7/19: LM nl, p-mLAD 100% w/ right to left collaterals, OM2 90% (2 mm), 1st LPL 40%, medical management  . Diabetes mellitus ([place]   .  Diverticulosis   . GERD (gastroesophageal reflux disease)   . Hematuria   . Hypercholesterolemia   . Hypertension   . Hypothyroidism   . Kidney stones    Past Surgical History:  Procedure Laterality Date  . APPENDECTOMY    . BREAST BIOPSY  1969   right  . CHOLECYSTECTOMY  2005  . COLONOSCOPY    . COLONOSCOPY WITH PROPOFOL N/A 09/19/2017   Procedure: COLONOSCOPY WITH PROPOFOL;  Surgeon: Toledo, TBenay Pike MD;  Location: [facility] ENDOSCOPY;  Service: Gastroenterology;  Laterality: N/A;  . ESOPHAGOGASTRODUODENOSCOPY (EGD) WITH PROPOFOL N/A 09/19/2017   Procedure: ESOPHAGOGASTRODUODENOSCOPY (EGD) WITH PROPOFOL;  Surgeon: Toledo, TBenay Pike MD;  Location: [facility] ENDOSCOPY;  Service: Gastroenterology;  Laterality: N/A;  . LEFT HEART CATH AND CORONARY ANGIOGRAPHY Left 08/07/2017   Procedure: LEFT HEART CATH AND CORONARY ANGIOGRAPHY;  Surgeon: AWellington Hampshire MD;  [facility];  Service: Cardiovascular;  Laterality: Left;  . UPPER GI ENDOSCOPY     Family History  Problem Relation Age of Onset  . Brain cancer Mother        history of brain tumor  . Lung disease Father   . Diabetes Brother        x2  . Bipolar disorder Son   . Alcohol abuse Son   . Breast cancer Neg Hx   . Colon cancer Neg Hx    Social  History   Socioeconomic History  . Marital status: Married    Spouse name: Not on file  . Number of children: Not on file  . Years of education: Not on file  . Highest education level: Not on file  Occupational History  . Not on file  Tobacco Use  . Smoking status: Current Every Day Smoker    Packs/day: 1.00    Years: 50.00    Pack years: 50.00  . Smokeless tobacco: Never Used  Substance and Sexual Activity  . Alcohol use: No    Alcohol/week: 0.0 standard drinks  . Drug use: No  . Sexual activity: Not on file  Other Topics Concern  . Not on file  Social History Narrative  . Not on file   Social Determinants of Health   Financial Resource Strain: Medium  Risk  . Difficulty of Paying Living Expenses: Somewhat hard  Food Insecurity:   . Worried About Charity fundraiser in the Last Year:   . Arboriculturist in the Last Year:   Transportation Needs:   . Film/video editor (Medical):   Marland Kitchen Lack of Transportation (Non-Medical):   Physical Activity:   . Days of Exercise per Week:   . Minutes of Exercise per Session:   Stress:   . Feeling of Stress :   Social Connections:   . Frequency of Communication with Friends and Family:   . Frequency of Social Gatherings with Friends and Family:   . Attends Religious Services:   . Active Member of Clubs or Organizations:   . Attends Archivist Meetings:   Marland Kitchen Marital Status:     Outpatient Encounter Medications as of 05/31/2019  Medication Sig  . aspirin 81 MG tablet Take 81 mg by mouth daily.  Marland Kitchen atorvastatin (LIPITOR) 40 MG tablet TAKE 1 TABLET BY MOUTH EVERY DAY  . carvedilol (COREG) 3.125 MG tablet TAKE 1 TABLET BY MOUTH TWICE DAILY  . cyanocobalamin 1000 MCG tablet Take 1,000 mcg by mouth as needed. Takes occasionally  . empagliflozin (JARDIANCE) 25 MG TABS tablet Take 25 mg by mouth daily before breakfast.  . Glucosamine 500 MG CAPS Take 1 capsule by mouth as needed.  Marland Kitchen glucose blood test strip Use as instructed to blood sugar twice daily  . insulin degludec (TRESIBA) 100 UNIT/ML FlexTouch Pen Inject 0.1 mLs (10 Units total) into the skin daily.  . Insulin Pen Needle 32G X 6 MM MISC To fit TRESIBA PEN  . levothyroxine (SYNTHROID) 100 MCG tablet Take 1 tablet (100 mcg total) by mouth daily.  Marland Kitchen lisinopril (ZESTRIL) 20 MG tablet Take 1 tablet (20 mg total) by mouth daily.  . pantoprazole (PROTONIX) 40 MG tablet Take 40 mg by mouth as needed.    No facility-administered encounter medications on file as of 05/31/2019.    Activities of Daily Living In your present state of health, do you have any difficulty performing the following activities: 05/31/2019  Hearing? N  Vision? N    Difficulty concentrating or making decisions? N  Walking or climbing stairs? N  Dressing or bathing? N  Doing errands, shopping? N  Preparing Food and eating ? N  Using the Toilet? N  In the past six months, have you accidently leaked urine? N  Do you have problems with loss of bowel control? N  Managing your Medications? N  Managing your Finances? N  Housekeeping or managing your Housekeeping? N  Some recent data might be hidden    Patient  Care Team: Einar Pheasant, MD as PCP - General (Internal Medicine) Wellington Hampshire, MD as PCP - Cardiology (Cardiology) De Hollingshead, [facility] as Pharmacist (Pharmacist)    Assessment:   This is a routine wellness examination for [place].  Nurse connected with patient 05/31/19 at 11:30 AM EDT by a telephone enabled telemedicine application and verified that I am speaking with the correct person using two identifiers. Patient stated full name and DOB. Patient gave permission to continue with virtual visit. Patient's location was at home and Nurse's location was at [place] office.   Patient is alert and oriented x3. Patient denies difficulty focusing or concentrating.  Health Maintenance Due: -Eye Exam- plans to schedule -Hgb A1c- 04/23/19 (8.6) See completed HM at the end of note.   Eye: Visual acuity not assessed. Virtual visit. Followed by their ophthalmologist.  Retinopathy- none reported.  Dental: Visits every 6 months.   Dentures- yes  Hearing: Demonstrates normal hearing during visit.  Safety:  Patient feels safe at home- yes Patient does have smoke detectors at home- yes Patient does wear sunscreen or protective clothing when in direct sunlight - yes Patient does wear seat belt when in a moving vehicle - yes Patient drives- yes Adequate lighting in walkways free from debris- yes Grab bars and handrails used as appropriate- yes Ambulates with an assistive device- no Cell phone on person when ambulating outside of the home-  yes  Social: Alcohol intake - no  Smoking history- current Illicit drug use? none  Medication: Taking as directed and without issues.  Self managed - yes   Covid-19: Precautions and sickness symptoms discussed. Wears mask, social distancing, hand hygiene as appropriate.   Activities of Daily Living Patient denies needing assistance with: household chores, feeding themselves, getting from bed to chair, getting to the toilet, bathing/showering, dressing, managing money, or preparing meals.   Discussed the importance of a healthy diet, water intake and the benefits of aerobic exercise.  Physical activity- gym exercises with treadmill and weights 3 days weekly 35-40 minutes.  Diet:  Low Carb Water: poor intake; encouraged to drink more water, stay hydrated Caffeine: diet coke/pepsi  Other Providers Patient Care Team: Einar Pheasant, MD as PCP - General (Internal Medicine) Wellington Hampshire, MD as PCP - Cardiology (Cardiology) De Hollingshead, [facility] as Pharmacist (Pharmacist)  Exercise Activities and Dietary recommendations    Goals      Patient Stated   . "I need to get my blood sugar better controlled" (pt-stated)     CARE PLAN ENTRY (see longtitudinal plan of care for additional care plan information)  Current Barriers:  . Diabetes: uncontrolled though improved per SMBG; most recent A1c 8.6% o Reports more stress recently. Daughter is having a hard time s/p the loss of patient's son, patient is doing a lot of babysitting of her grandson. . Last eGFR ~49  . Current antihyperglycemic regimen: Tresiba 8 units daily, Jardiance 25 mg daily o Notes that she stopped metformin back in summer of 2019 because "they told me to stop it at the hospital" No documented reason why, metformin was removed from her medication list in 07/2017 d/t patient not taking. . Denies hypoglycemia . Current blood glucose readings:  o Fasting: this morning was 150; usually 120-130s o 2 hours after  meals: 120-130s  . Cardiovascular risk reduction: o Current hypertensive regimen: lisinopril 20 mg daily, carvedilol 3.125 mg BID; SBP 120s-140/70s o Current hyperlipidemia regimen: atorvastatin 40 mg daily; last LDL at goal 70 o Current  antiplatelet regimen: ASA 81 mg daily - though notes that she often forgets to take this . Acid reflux: pantoprazole 40 mg PRN; reports that this PRN use is working for her symptoms . Wrist inflammation/pain: glucosamine PRN, reports that this does seem to help her symptoms . Supplements: Vitamin B12  Pharmacist Clinical Goal(s):  Marland Kitchen Over the next 90 days, [name] and primary care provider to address optimized glycemic management  Interventions: . Comprehensive medication review performed, medication list updated in electronic medical record . Inter-disciplinary care team collaboration (see longitudinal plan of care) . Increase Tresiba to 10 units daily to target slightly elevating fastings. Patient verbalizes understanding . Reviewed importance of adherence to ASA therapy for primary prevention . Reviewed refill hx. Patient up to date on statin and RAAS fills  Patient Self Care Activities:  . Patient will check blood glucose BID-TID, document, and provide at future appointments . Patient will take medications as prescribed . Patient will contact provider with any episodes of hypoglycemia . Patient will report any questions or concerns to provider  . Patient will collaborate on completion of patient assistance application  Please see past updates related to this goal by clicking on the "Past Updates" button in the selected goal         Fall Risk Fall Risk  05/31/2019 01/23/2019 05/30/2018 05/12/2017 12/09/2016  Falls in the past year? 0 1 0 No No  Number falls in past yr: - 1 - - -  Injury with Fall? - 1 - - -  Risk for fall due to : - - - - -  Follow up Falls evaluation completed Falls evaluation completed - - -   Timed Get Up  and Go performed: no, virtual visit  Depression Screen PHQ 2/9 Scores 05/31/2019 05/30/2018 05/12/2017 12/09/2016  PHQ - 2 Score 1 0 0 0  PHQ- 9 Score - - - -     Cognitive Function     6CIT Screen 05/31/2019 05/30/2018 05/12/2017  What Year? 0 points 0 points 0 points  What month? 0 points 0 points 0 points  What time? 0 points 0 points 0 points  Count back from 20 - 0 points 0 points  Months in reverse 0 points 0 points 0 points  Repeat phrase - - 0 points  Total Score - - 0    Immunization History  Administered Date(s) Administered  . Influenza Split 10/16/2013  . Influenza Whole 11/24/2016  . Influenza, High Dose Seasonal PF 11/05/2017, 10/18/2018  . Influenza,inj,quad, With Preservative 11/07/2017  . Influenza-Unspecified 11/07/2012, 10/27/2014  . Moderna SARS-COVID-2 Vaccination 03/09/2019, 04/07/2019  . Pneumococcal Conjugate-13 03/02/2016  . Pneumococcal Polysaccharide-23 12/08/2012, 03/02/2017  . Tdap 10/14/2013   Screening Tests Health Maintenance  Topic Date Due  . DEXA SCAN  Never done  . OPHTHALMOLOGY EXAM  12/13/2017  . INFLUENZA VACCINE  09/08/2019  . MAMMOGRAM  09/10/2019  . FOOT EXAM  09/14/2019  . HEMOGLOBIN A1C  10/24/2019  . Fecal DNA (Cologuard)  06/14/2020  . TETANUS/TDAP  10/15/2023  . COVID-19 Vaccine  Completed  . Hepatitis C Screening  Completed  . PNA vac Low Risk Adult  Completed       Plan:   Keep all routine maintenance appointments.   Next scheduled lab 06/12/19 lab recheck  Follow up CCM via telephone 07/11/19  Follow up 08/13/19 office visit  Labs 08/21/19   End of life planning; Advance aging; Advanced directives discussed.  Copy of current HCPOA/Living Will mailed  per patient request.     Medicare Attestation I have personally reviewed: The patient's medical and social history Their use of alcohol, tobacco or illicit drugs Their current medications and supplements The patient's functional ability including ADLs,fall risks, home  safety risks, cognitive, and hearing and visual impairment Diet and physical activities Evidence for depression   I have reviewed and discussed with patient certain preventive protocols, quality metrics, and best practice recommendations.      Varney Biles, LPN  1/32/4401   Reviewed above information.  Agree with assessment and plan.    Dr Nicki Reaper

## 2019-05-31 NOTE — Telephone Encounter (Signed)
Pt will need Bun and creatine lab order before pt CT appt on 06/18/2019. Please advise and Thank you!

## 2019-05-31 NOTE — Progress Notes (Signed)
Order placed for CT without contrast to f/u adrenal lesion.

## 2019-06-05 NOTE — Addendum Note (Signed)
Addended by: Lars Masson on: 06/05/2019 08:42 AM   Modules accepted: Orders

## 2019-06-05 NOTE — Telephone Encounter (Signed)
STAT BUN and Creatinine ordered future to be done prior to her CT scan on 5/11.

## 2019-06-12 ENCOUNTER — Other Ambulatory Visit (INDEPENDENT_AMBULATORY_CARE_PROVIDER_SITE_OTHER): Payer: Medicare Other

## 2019-06-12 ENCOUNTER — Other Ambulatory Visit: Payer: Self-pay

## 2019-06-12 ENCOUNTER — Other Ambulatory Visit: Payer: Self-pay | Admitting: Physician Assistant

## 2019-06-12 DIAGNOSIS — D696 Thrombocytopenia, unspecified: Secondary | ICD-10-CM

## 2019-06-12 LAB — PLATELET COUNT: Platelets: 132 10*3/uL — ABNORMAL LOW (ref 140–400)

## 2019-06-13 ENCOUNTER — Encounter: Payer: Self-pay | Admitting: Internal Medicine

## 2019-06-18 ENCOUNTER — Ambulatory Visit
Admission: RE | Admit: 2019-06-18 | Discharge: 2019-06-18 | Disposition: A | Discharge status: Home / Self Care | Payer: Medicare Other | Source: Ambulatory Visit | Attending: Internal Medicine | Admitting: Internal Medicine

## 2019-06-18 ENCOUNTER — Other Ambulatory Visit: Payer: Self-pay

## 2019-06-18 DIAGNOSIS — D3501 Benign neoplasm of right adrenal gland: Secondary | ICD-10-CM

## 2019-06-18 DIAGNOSIS — D3502 Benign neoplasm of left adrenal gland: Secondary | ICD-10-CM | POA: Diagnosis not present

## 2019-07-09 ENCOUNTER — Ambulatory Visit (INDEPENDENT_AMBULATORY_CARE_PROVIDER_SITE_OTHER): Payer: Medicare Other | Admitting: Cardiovascular Disease

## 2019-07-09 ENCOUNTER — Encounter: Payer: Self-pay | Admitting: Cardiovascular Disease

## 2019-07-09 ENCOUNTER — Other Ambulatory Visit: Payer: Self-pay

## 2019-07-09 VITALS — BP 120/60 | HR 58 | Ht 62.0 in | Wt 146.2 lb

## 2019-07-09 DIAGNOSIS — E785 Hyperlipidemia, unspecified: Secondary | ICD-10-CM

## 2019-07-09 DIAGNOSIS — I700 Atherosclerosis of aorta: Secondary | ICD-10-CM | POA: Diagnosis not present

## 2019-07-09 DIAGNOSIS — I10 Essential (primary) hypertension: Secondary | ICD-10-CM | POA: Diagnosis not present

## 2019-07-09 DIAGNOSIS — I2510 Atherosclerotic heart disease of native coronary artery without angina pectoris: Secondary | ICD-10-CM

## 2019-07-09 NOTE — Progress Notes (Signed)
Cardiology Office Note   Date:  07/09/2019   ID:  Ann Collins, DOB [birth year], MRN [id number]  PCP:  Einar Pheasant, MD  Cardiologist:   Kathlyn Sacramento, MD   Chief Complaint  Patient presents with  . office visit    Pt has no complaints. Meds verbally reviewed w/ pt.       History of Present Illness: Ann Collins is a 74 y.o. female who is here today for follow-up visit regarding coronary artery disease. She has chronic medical conditions that include diabetes mellitus, hyperlipidemia,  tobacco use and hypertension. She has known history of esophageal erosions and dysphagia.   She was evaluated in June 2019 for chest pain and abnormal stress test which showed evidence of LAD ischemia with low normal ejection fraction. Cardiac catheterization in July 2019 showed left dominant system with severe two-vessel coronary artery disease.  The LAD was chronically occluded proximally with well-developed collaterals from the right coronary artery.  There was also 90% stenosis in OM 2 which was a 2 mm vessel. Low normal LV systolic function with an EF of 50 to 55% with anterior and apical hypokinesis.  Mildly elevated left ventricular end-diastolic pressure.  She was treated medically with no revascularization.  She has been doing recent chest pain or shortness of breath.  She takes her medications regularly.  She reports inability to quit smoking.    Past Medical History:  Diagnosis Date  . Abdominal hernia   . CAD (coronary artery disease)    a. LHC 7/19: LM nl, p-mLAD 100% w/ right to left collaterals, OM2 90% (2 mm), 1st LPL 40%, medical management  . Diabetes mellitus ([place])   . Diverticulosis   . GERD (gastroesophageal reflux disease)   . Hematuria   . Hypercholesterolemia   . Hypertension   . Hypothyroidism   . Kidney stones     Past Surgical History:  Procedure Laterality Date  . APPENDECTOMY    . BREAST BIOPSY  1969   right  . CHOLECYSTECTOMY  2005  . COLONOSCOPY     . COLONOSCOPY WITH PROPOFOL N/A 09/19/2017   Procedure: COLONOSCOPY WITH PROPOFOL;  Surgeon: Toledo, Benay Pike, MD;  Location: [facility] ENDOSCOPY;  Service: Gastroenterology;  Laterality: N/A;  . ESOPHAGOGASTRODUODENOSCOPY (EGD) WITH PROPOFOL N/A 09/19/2017   Procedure: ESOPHAGOGASTRODUODENOSCOPY (EGD) WITH PROPOFOL;  Surgeon: Toledo, Benay Pike, MD;  Location: [facility] ENDOSCOPY;  Service: Gastroenterology;  Laterality: N/A;  . LEFT HEART CATH AND CORONARY ANGIOGRAPHY Left 08/07/2017   Procedure: LEFT HEART CATH AND CORONARY ANGIOGRAPHY;  Surgeon: Wellington Hampshire, MD;  Location: [facility] [place] [facility];  Service: Cardiovascular;  Laterality: Left;  . UPPER GI ENDOSCOPY       Current Outpatient Medications  Medication Sig Dispense Refill  . aspirin 81 MG tablet Take 81 mg by mouth daily.    Marland Kitchen atorvastatin (LIPITOR) 40 MG tablet TAKE 1 TABLET BY MOUTH EVERY DAY 90 tablet 0  . carvedilol (COREG) 3.125 MG tablet TAKE 1 TABLET BY MOUTH TWICE DAILY 180 tablet 0  . cyanocobalamin 1000 MCG tablet Take 1,000 mcg by mouth as needed. Takes occasionally    . empagliflozin (JARDIANCE) 25 MG TABS tablet Take 25 mg by mouth daily before breakfast. 30 tablet 1  . Glucosamine 500 MG CAPS Take 1 capsule by mouth as needed.    Marland Kitchen glucose blood test strip Use as instructed to blood sugar twice daily 100 each 12  . insulin degludec (TRESIBA) 100 UNIT/ML FlexTouch Pen Inject 0.1 mLs (  10 Units total) into the skin daily. 2 pen 1  . Insulin Pen Needle 32G X 6 MM MISC To fit TRESIBA PEN 90 each 1  . levothyroxine (SYNTHROID) 100 MCG tablet Take 1 tablet (100 mcg total) by mouth daily. 90 tablet 1  . lisinopril (ZESTRIL) 20 MG tablet Take 1 tablet (20 mg total) by mouth daily. 90 tablet 2  . pantoprazole (PROTONIX) 40 MG tablet Take 40 mg by mouth as needed.      No current facility-administered medications for this visit.    Allergies:   Pioglitazone, Macrobid [nitrofurantoin macrocrystal], and Nitrofurantoin    Social  History:  The patient  reports that she has been smoking. She has a 50.00 pack-year smoking history. She has never used smokeless tobacco. She reports that she does not drink alcohol or use drugs.   Family History:  The patient's family history includes Alcohol abuse in her son; Bipolar disorder in her son; Brain cancer in her mother; Diabetes in her brother; Lung disease in her father.    ROS:  Please see the history of present illness.   Otherwise, review of systems are positive for none.   All other systems are reviewed and negative.    PHYSICAL EXAM: VS:  BP 120/60 (BP Location: Left Arm, Patient Position: Sitting, Cuff Size: Normal)   Pulse (!) 58   Ht 5\' 2"  (1.575 m)   Wt 146 lb 4 oz (66.3 kg)   LMP 03/10/1991   SpO2 98%   BMI 26.75 kg/m  , BMI Body mass index is 26.75 kg/m. GEN: Well nourished, well developed, in no acute distress  HEENT: normal  Neck: no JVD, carotid bruits, or masses Cardiac: RRR; no murmurs, rubs, or gallops,no edema  Respiratory:  clear to auscultation bilaterally, normal work of breathing GI: soft, nontender, nondistended, + BS MS: no deformity or atrophy  Skin: warm and dry, no rash Neuro:  Strength and sensation are intact Psych: euthymic mood, full affect Distal pulses are palpable.   EKG:  EKG is ordered today. The ekg ordered today demonstrates sinus bradycardia with a PVC.   Recent Labs: 07/12/2018: TSH 1.52 04/23/2019: ALT 20; BUN 19; Creatinine, Ser 1.09; Potassium 5.0; Sodium 141 05/21/2019: Hemoglobin 13.6 06/12/2019: Platelets 132    Lipid Panel    Component Value Date/Time   CHOL 129 04/23/2019 0823   CHOL 134 10/18/2017 1447   TRIG 111.0 04/23/2019 0823   HDL 36.40 (L) 04/23/2019 0823   HDL 41 10/18/2017 1447   CHOLHDL 4 04/23/2019 0823   VLDL 22.2 04/23/2019 0823   LDLCALC 70 04/23/2019 0823   LDLCALC 63 10/18/2017 1447   LDLDIRECT 71 10/18/2017 1447   LDLDIRECT 156.0 08/29/2016 1523      Wt Readings from Last 3  Encounters:  07/09/19 146 lb 4 oz (66.3 kg)  05/31/19 145 lb (65.8 kg)  04/25/19 145 lb (65.8 kg)       PAD Screen 07/21/2017  Previous PAD dx? No  Previous surgical procedure? No  Pain with walking? No  Feet/toe relief with dangling? No  Painful, non-healing ulcers? No  Extremities discolored? No      ASSESSMENT AND PLAN:  1.  Coronary artery disease involving native coronary arteries without angina: She is overall doing well with no anginal symptoms.  I recommend continuing medical therapy.  2.  Hyperlipidemia: Continue treatment with atorvastatin.  Reviewed most recent lipid profile which showed an LDL of 70  3.  Tobacco use: I again discussed with her  the importance of smoking cessation.  She reports inability to quit .  4.  Essential hypertension: Blood pressures well controlled on lisinopril and carvedilol.    Disposition:   FU with me in 12 months   Signed, Kathlyn Sacramento, MD 07/09/19 [place], [place]

## 2019-07-09 NOTE — Patient Instructions (Signed)

## 2019-07-11 ENCOUNTER — Ambulatory Visit (INDEPENDENT_AMBULATORY_CARE_PROVIDER_SITE_OTHER): Payer: Medicare Other | Admitting: Pharmacist

## 2019-07-11 DIAGNOSIS — Z87891 Personal history of nicotine dependence: Secondary | ICD-10-CM

## 2019-07-11 DIAGNOSIS — E1165 Type 2 diabetes mellitus with hyperglycemia: Secondary | ICD-10-CM

## 2019-07-11 DIAGNOSIS — I700 Atherosclerosis of aorta: Secondary | ICD-10-CM

## 2019-07-11 MED ORDER — INSULIN DEGLUDEC 100 UNIT/ML ~~LOC~~ SOPN: 6.0000 [IU] | PEN_INJECTOR | Freq: Two times a day (BID) | SUBCUTANEOUS | 1 refills | Status: DC

## 2019-07-11 NOTE — Progress Notes (Signed)
I have reviewed the above note and agree. I was available to the pharmacist for consultation.  Sigfredo Schreier, MD 

## 2019-07-11 NOTE — Chronic Care Management (AMB) (Signed)
Chronic Care Management   Follow Up Note   07/11/2019 Name: GESSELLE FITZSIMONS MRN: [id number] DOB: March 13, 1945  Referred by: Einar Pheasant, MD Reason for referral : Chronic Care Management (Medication Management)   ZEENAT JEANBAPTISTE is a 74 y.o. year old female who is a primary care patient of Einar Pheasant, MD. The CCM team was consulted for assistance with chronic disease management and care coordination needs.    Contacted patient for medication management review.   Review of patient status, including review of consultants reports, relevant laboratory and other test results, and collaboration with appropriate care team members and the patient's provider was performed as part of comprehensive patient evaluation and provision of chronic care management services.    SDOH (Social Determinants of Health) assessments performed: Yes See Care Plan activities for detailed interventions related to SDOH)  SDOH Interventions     Most Recent Value  SDOH Interventions  SDOH Interventions for the Following Domains  Tobacco  Financial Strain Interventions  Other (Comment) [patient assistance]  Tobacco Interventions  Cessation Materials Given and Reviewed       Outpatient Encounter Medications as of 07/11/2019  Medication Sig Note   aspirin 81 MG tablet Take 81 mg by mouth daily.    atorvastatin (LIPITOR) 40 MG tablet TAKE 1 TABLET BY MOUTH EVERY DAY    carvedilol (COREG) 3.125 MG tablet TAKE 1 TABLET BY MOUTH TWICE DAILY    cyanocobalamin 1000 MCG tablet Take 1,000 mcg by mouth as needed. Takes occasionally    empagliflozin (JARDIANCE) 25 MG TABS tablet Take 25 mg by mouth daily before breakfast.    Glucosamine 500 MG CAPS Take 1 capsule by mouth as needed. 09/20/2018: Taking for wrist pain   glucose blood test strip Use as instructed to blood sugar twice daily    insulin degludec (TRESIBA) 100 UNIT/ML FlexTouch Pen Inject 0.1 mLs (10 Units total) into the skin daily.    Insulin Pen  Needle 32G X 6 MM MISC To fit TRESIBA PEN    levothyroxine (SYNTHROID) 100 MCG tablet Take 1 tablet (100 mcg total) by mouth daily.    lisinopril (ZESTRIL) 20 MG tablet Take 1 tablet (20 mg total) by mouth daily.    pantoprazole (PROTONIX) 40 MG tablet Take 40 mg by mouth as needed.  07/11/2019: PRN    No facility-administered encounter medications on file as of 07/11/2019.     Objective:   Goals Addressed            This Visit's Progress     Patient Stated    "I need to get my blood sugar better controlled" (pt-stated)       CARE PLAN ENTRY (see longtitudinal plan of care for additional care plan information)  Current Barriers:   Diabetes: uncontrolled though improved per SMBG; most recent A1c 8.6%  Last eGFR ~49 mL/min  Current antihyperglycemic regimen: Tresiba 6 units BID, Jardiance 25 mg daily o Reports that BID Tyler Aas seems  o Notes that she stopped metformin back in summer of 2019 because "they told me to stop it at the hospital" No documented reason why, metformin was removed from her medication list in 07/2017 d/t patient not taking.  Denies hypoglycemia.   Current blood glucose readings:  o Fasting: ~100s; does report one fasting of 70s, but no symptoms of hypoglycemia  o Sporadically randoms: all <180  Cardiovascular risk reduction: o Current hypertensive regimen: lisinopril 20 mg daily, carvedilol 3.125 mg BID; SBP 120s-130/60-70s o Current hyperlipidemia regimen: atorvastatin 40  mg daily, last LDL ~ 70 o Current antiplatelet regimen: ASA 81 mg daily - notes she stopped taking this after her PLT drop  Acid reflux: pantoprazole 40 mg PRN; reports that this PRN use is working for her symptoms  Wrist inflammation/pain: glucosamine PRN, reports that this does seem to help her symptoms  Supplements: Vitamin B12 1000 units  Tobacco use: reports smoking ~ 1 ppd currently. Reports that she struggles with anger after her son's death, and she worries that stopping  smoking will negatively impact mood and relationships. Reports there is also an element of habit, ie when she drives. Notes that she has quit before, but always relapsed. Notes that she did agree to a [facility] cessation program at one time, but was told she didn't qualify  Pharmacist Clinical Goal(s):   Over the next 90 days, [name] and primary care provider to address optimized glycemic management  Interventions:  Comprehensive medication review performed, medication list updated in electronic medical record  Inter-disciplinary care team collaboration (see longitudinal plan of care)  Counseled that although Tresiba technically is a once daily medication, if BID is working better for her and she is OK with two injections daily, we can continue this. Continue 6 units BID and Jardiance 25 mg daily  Reviewed goal A1c, goal fasting, and goal 2 hour post prandial glucoses  Reviewed goal BP  Discussed tobacco cessation. Reviewed risks of continued tobacco use, and benefits of cessation. Reviewed that there are safer medications for anxiety/depression/mood than using tobacco to self-medicate. Notes that she "doesn't like those medications", as she was put on something for her mood as a teenager. Reviewed that we have many safer, cleaner medications these days than 50 years ago. Reviewed [place] [facility]uitline, will mail brochure. Encouraged to call, as counseling to help identify and support other coping strategies other than tobacco would be beneficial. Briefly reviewed that recommended therapies would be Chantix vs dual nicotine replacement therapy. Could apply for Chantix assistance, and could investigate options of NRT (including [place] [facility]uit[facility]). Patient verbalized understanding and appreciation.  Patient Self Care Activities:   Patient will check blood glucose BID-TID, document, and provide at future appointments  Patient will take medications as prescribed  Patient will contact provider  with any episodes of hypoglycemia  Patient will report any questions or concerns to provider   Patient will collaborate on completion of patient assistance application  Please see past updates related to this goal by clicking on the "Past Updates" button in the selected goal          Plan: - Scheduled f/u call in ~ 10 weeks  [name], PharmD, [place], [place] Pharmacist [place] [phone]

## 2019-07-11 NOTE — Patient Instructions (Addendum)
Ms. Faulk,   It was great talking with you today!  We talked about a few things:   1) Keep up the great work with your blood sugars. If Tyler Aas 6 units twice daily seems to work best for you, we can continue that, if you don't mind 2 injections daily. Keep up Jardiance 25 mg daily. Continue checking blood sugars fasting, and occasionally about 2 hours after meals. For our goal A1c of <7%, we want to see fastings remain <130 and 2 hour after meals <180.   2) Blood pressure and cholesterol are well controlled  3) Quitting smoking is one of the best things you can do for your health. I want you to keep being able to play with your sweet grandbabbies for as long as possible! I've included the brochure for the [place] [facility]uit[facility] - they have a phone service and texting service of counselors that can help you identify better coping strategies than smoking. We could also pursue using medications in the future- I recommend either Chantix or dual Nicotine Replacement Therapy (patches + gum or lozenges for cravings throughout the day). The Quitline may be able to supply you with a starter supply of nicotine replacement therapy.   As always, call me with any questions or concerns!  [name], PharmD [phone]  Visit Information  Goals Addressed            This Visit's Progress     Patient Stated   . "I need to get my blood sugar better controlled" (pt-stated)       CARE PLAN ENTRY (see longtitudinal plan of care for additional care plan information)  Current Barriers:  . Diabetes: uncontrolled though improved per SMBG; most recent A1c 8.6% . Last eGFR ~49 mL/min . Current antihyperglycemic regimen: Tresiba 6 units BID, Jardiance 25 mg daily o Reports that BID Tyler Aas seems  o Notes that she stopped metformin back in summer of 2019 because "they told me to stop it at the hospital" No documented reason why, metformin was removed from her medication list in 07/2017 d/t patient not  taking. . Denies hypoglycemia.  . Current blood glucose readings:  o Fasting: ~100s; does report one fasting of 70s, but no symptoms of hypoglycemia  o Sporadically randoms: all <180 . Cardiovascular risk reduction: o Current hypertensive regimen: lisinopril 20 mg daily, carvedilol 3.125 mg BID; SBP 120s-130/60-70s o Current hyperlipidemia regimen: atorvastatin 40 mg daily, last LDL ~ 70 o Current antiplatelet regimen: ASA 81 mg daily - notes she stopped taking this after her PLT drop . Acid reflux: pantoprazole 40 mg PRN; reports that this PRN use is working for her symptoms . Wrist inflammation/pain: glucosamine PRN, reports that this does seem to help her symptoms . Supplements: Vitamin B12 1000 units . Tobacco use: reports smoking ~ 1 ppd currently. Reports that she struggles with anger after her son's death, and she worries that stopping smoking will negatively impact mood and relationships. Reports there is also an element of habit, ie when she drives. Notes that she has quit before, but always relapsed. Notes that she did agree to a [facility] cessation program at one time, but was told she didn't qualify  Pharmacist Clinical Goal(s):  Marland Kitchen Over the next 90 days, [name] and primary care provider to address optimized glycemic management  Interventions: . Comprehensive medication review performed, medication list updated in electronic medical record . Inter-disciplinary care team collaboration (see longitudinal plan of care) . Counseled that although Tresiba technically  is a once daily medication, if BID is working better for her and she is OK with two injections daily, we can continue this. Continue 6 units BID and Jardiance 25 mg daily . Reviewed goal A1c, goal fasting, and goal 2 hour post prandial glucoses . Reviewed goal BP . Discussed tobacco cessation. Reviewed risks of continued tobacco use, and benefits of cessation. Reviewed that there are safer medications for  anxiety/depression/mood than using tobacco to self-medicate. Notes that she "doesn't like those medications", as she was put on something for her mood as a teenager. Reviewed that we have many safer, cleaner medications these days than 50 years ago. Reviewed [place] [facility]uitline, will mail brochure. Encouraged to call, as counseling to help identify and support other coping strategies other than tobacco would be beneficial. Briefly reviewed that recommended therapies would be Chantix vs dual nicotine replacement therapy. Could apply for Chantix assistance, and could investigate options of NRT (including [place] [facility]uitline). Patient verbalized understanding and appreciation.  Patient Self Care Activities:  . Patient will check blood glucose BID-TID, document, and provide at future appointments . Patient will take medications as prescribed . Patient will contact provider with any episodes of hypoglycemia . Patient will report any questions or concerns to provider  . Patient will collaborate on completion of patient assistance application  Please see past updates related to this goal by clicking on the "Past Updates" button in the selected goal         Patient verbalizes understanding of instructions provided today.  Plan: - Scheduled f/u call in ~ 10 weeks  [name], PharmD, [place], [place] Pharmacist [place] [phone]

## 2019-08-09 ENCOUNTER — Telehealth: Payer: Self-pay

## 2019-08-09 DIAGNOSIS — Z122 Encounter for screening for malignant neoplasm of respiratory organs: Secondary | ICD-10-CM

## 2019-08-09 DIAGNOSIS — Z87891 Personal history of nicotine dependence: Secondary | ICD-10-CM

## 2019-08-09 IMAGING — MG DIGITAL SCREENING BILATERAL MAMMOGRAM WITH TOMO AND CAD
8 series · 8 of 24 positions shown · non-contrast
Comparison: Previous exam(s).

CLINICAL DATA: Screening.

EXAM:
DIGITAL SCREENING BILATERAL MAMMOGRAM WITH TOMO AND CAD

[R CC synth-2D]
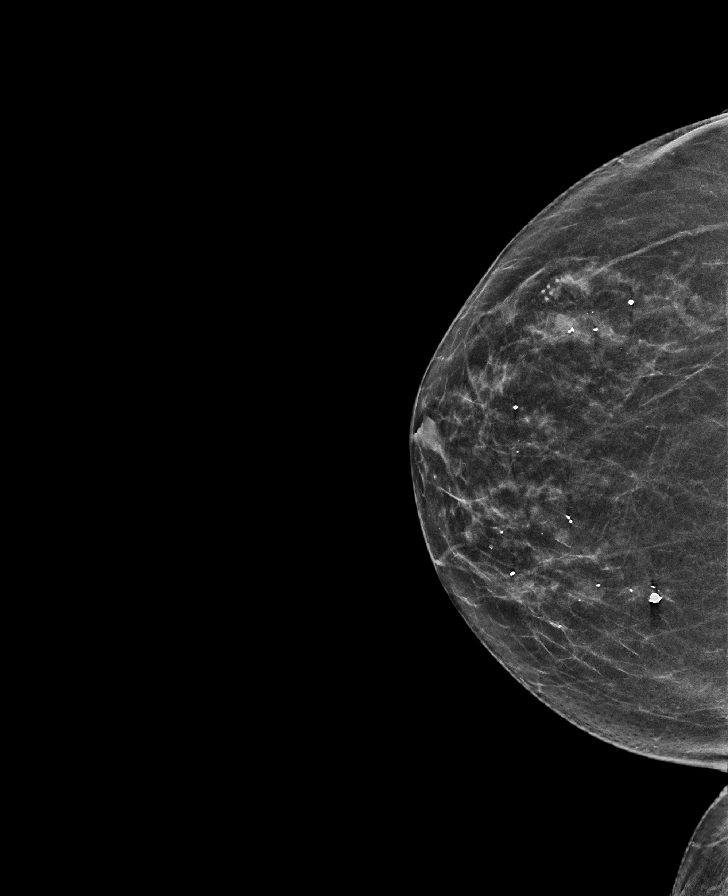

[R MLO synth-2D]
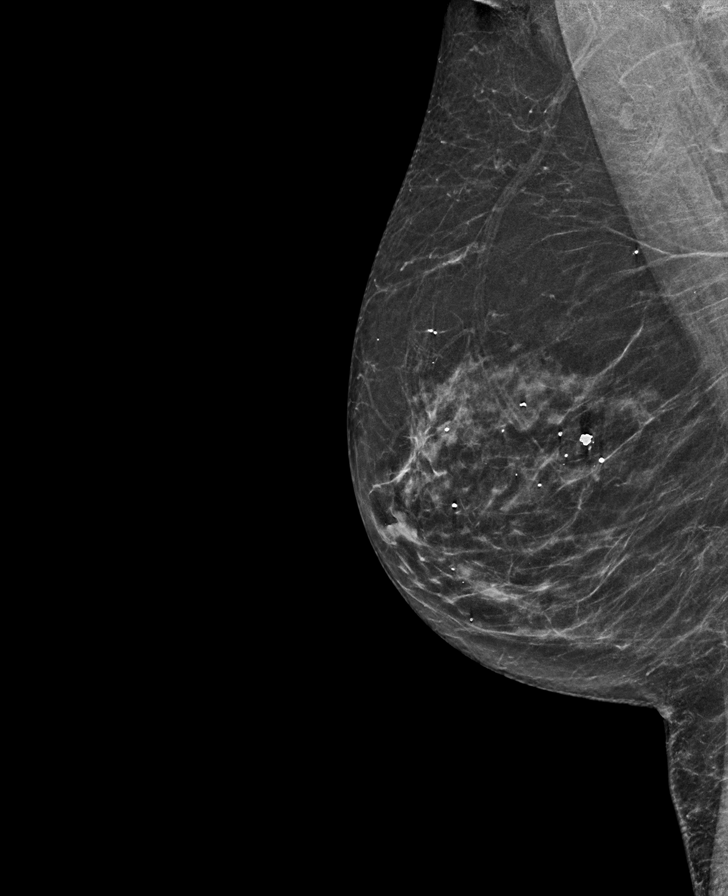

[L CC synth-2D]
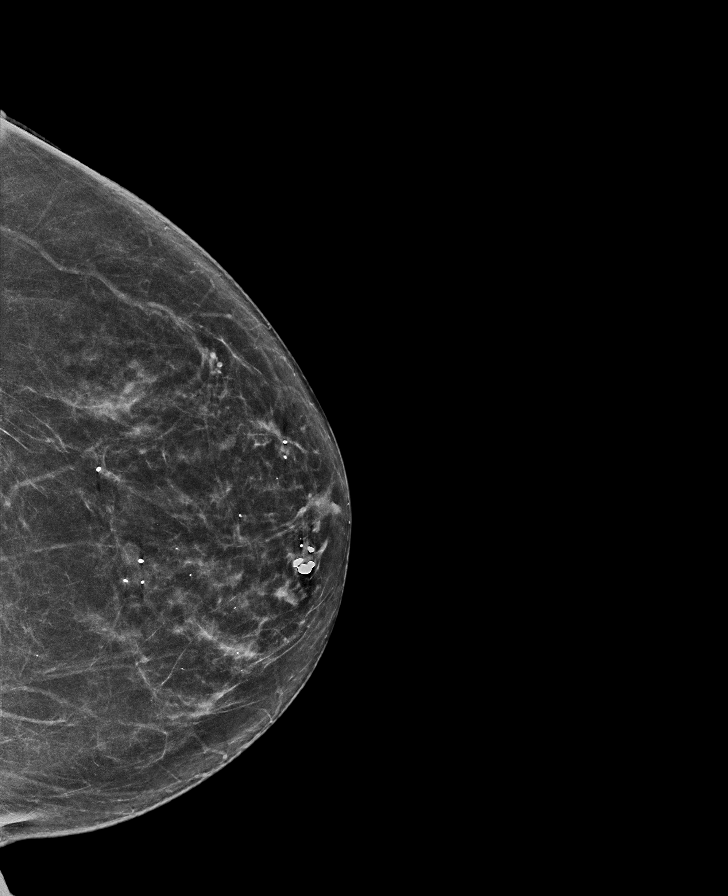

[L MLO synth-2D]
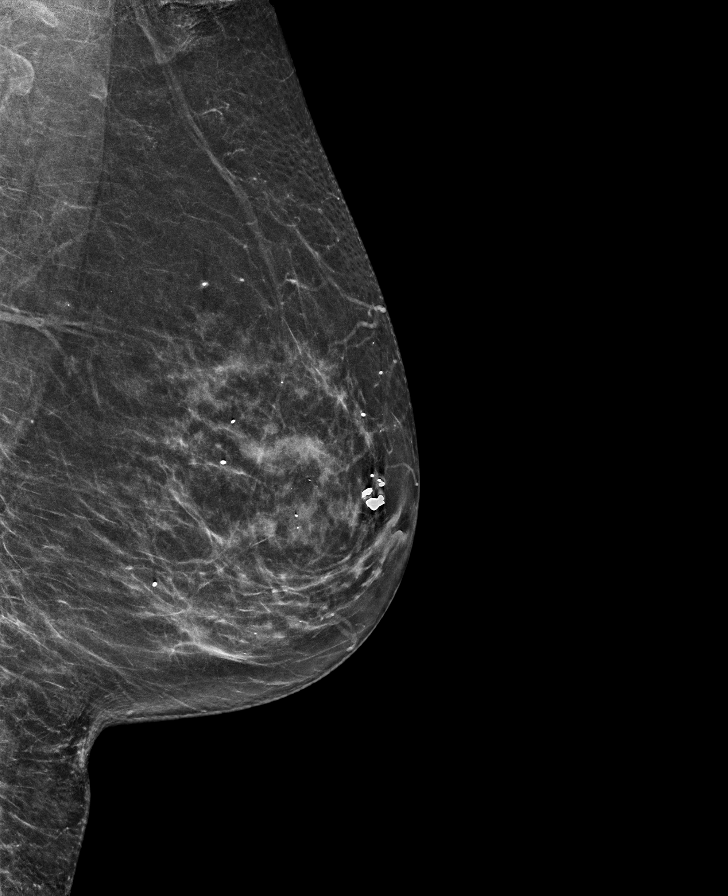

[R MLO tomo · tomo slice 31/61.0]
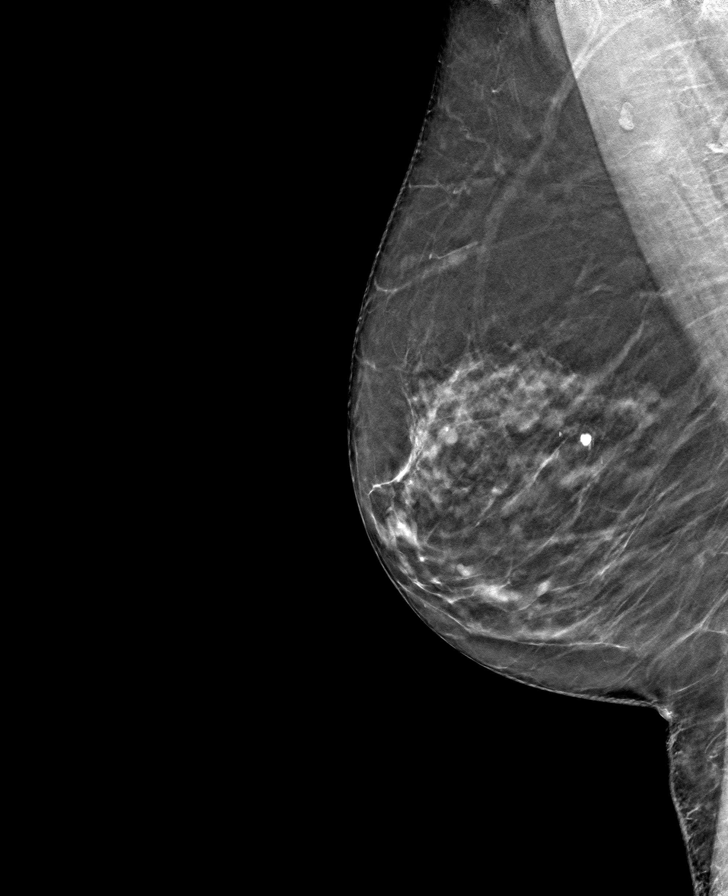

[R CC tomo · tomo slice 29/58.0]
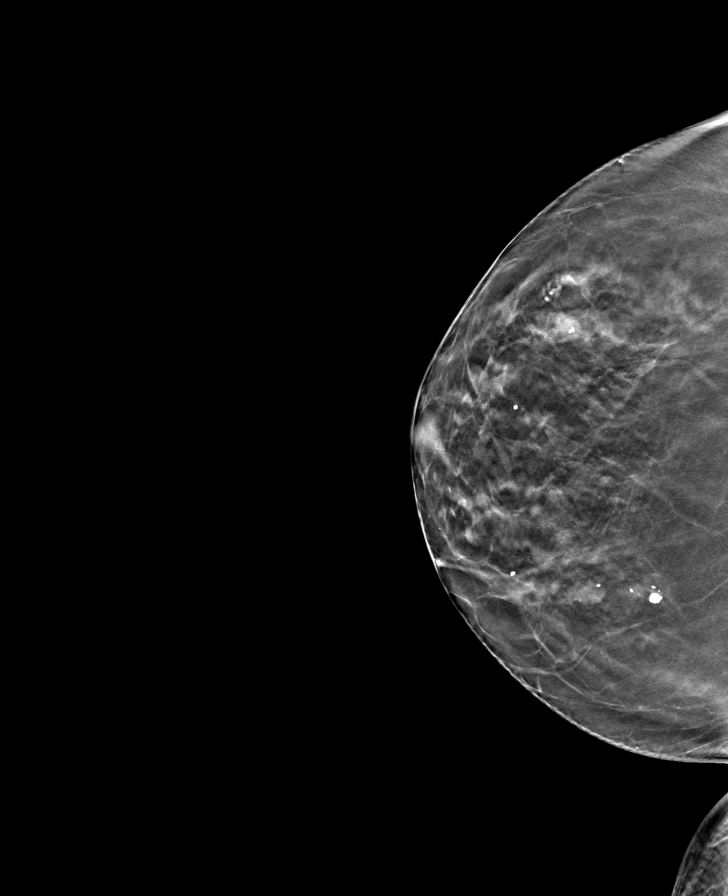

[L MLO tomo · tomo slice 33/65.0]
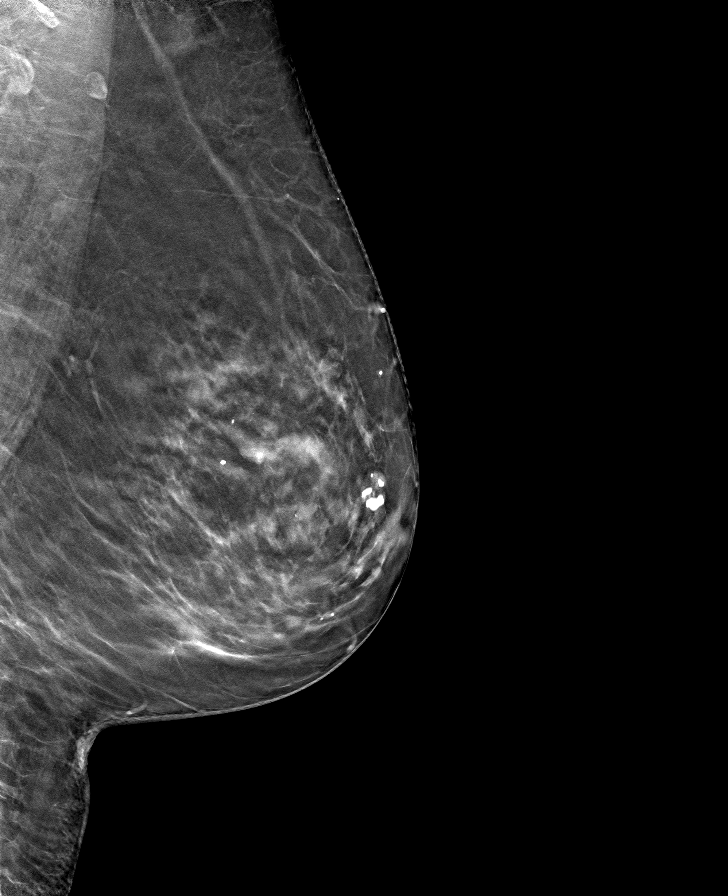

[L CC tomo · tomo slice 33/65.0]
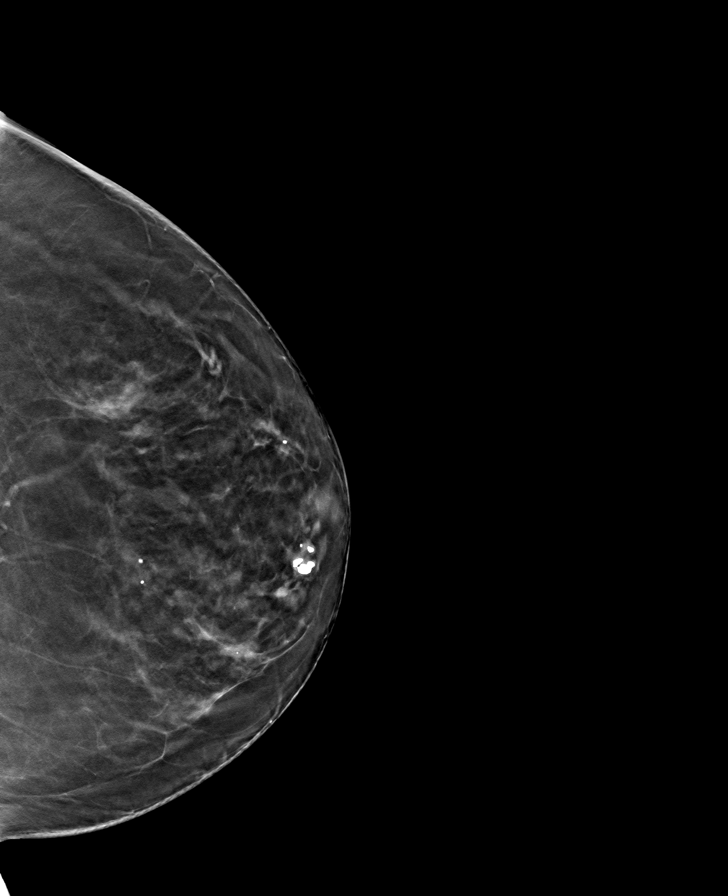

[8 of 24 positions shown; findings below may reference images not displayed]

ACR Breast Density Category b: There are scattered areas of
fibroglandular density.
FINDINGS: There are no findings suspicious for malignancy. Images were
processed with CAD.
IMPRESSION: No mammographic evidence of malignancy. A result letter of this
screening mammogram will be mailed directly to the patient.

RECOMMENDATION:
Screening mammogram in one year. (Code:[id number])

BI-RADS CATEGORY  1: Negative.

## 2019-08-09 NOTE — Telephone Encounter (Signed)
Patient has been notified that the low dose lung cancer screening CT scan is due currently or will be in near future.  Confirmed that patient is within the appropriate age range and asymptomatic, (no signs or symptoms of lung cancer).  Patient denies illness that would prevent curative treatment for lung cancer if found.  Patient is agreeable for CT scan being scheduled.    Verified smoking history (current smoker, with 50 year 1 ppd history).   CT scheduled for 08/26/2019 @ 8:00

## 2019-08-11 ENCOUNTER — Other Ambulatory Visit: Payer: Self-pay | Admitting: Internal Medicine

## 2019-08-12 ENCOUNTER — Other Ambulatory Visit: Payer: Self-pay | Admitting: Internal Medicine

## 2019-08-13 ENCOUNTER — Encounter: Payer: Self-pay | Admitting: Internal Medicine

## 2019-08-13 ENCOUNTER — Other Ambulatory Visit: Payer: Self-pay

## 2019-08-13 ENCOUNTER — Ambulatory Visit (INDEPENDENT_AMBULATORY_CARE_PROVIDER_SITE_OTHER): Payer: Medicare Other | Admitting: Internal Medicine

## 2019-08-13 VITALS — BP 122/70 | HR 70 | Temp 97.6°F | Resp 16 | Ht 62.0 in | Wt 144.0 lb

## 2019-08-13 DIAGNOSIS — E7800 Pure hypercholesterolemia, unspecified: Secondary | ICD-10-CM

## 2019-08-13 DIAGNOSIS — I2510 Atherosclerotic heart disease of native coronary artery without angina pectoris: Secondary | ICD-10-CM

## 2019-08-13 DIAGNOSIS — I10 Essential (primary) hypertension: Secondary | ICD-10-CM | POA: Diagnosis not present

## 2019-08-13 DIAGNOSIS — E039 Hypothyroidism, unspecified: Secondary | ICD-10-CM

## 2019-08-13 DIAGNOSIS — Z1231 Encounter for screening mammogram for malignant neoplasm of breast: Secondary | ICD-10-CM | POA: Diagnosis not present

## 2019-08-13 DIAGNOSIS — D696 Thrombocytopenia, unspecified: Secondary | ICD-10-CM

## 2019-08-13 DIAGNOSIS — I700 Atherosclerosis of aorta: Secondary | ICD-10-CM

## 2019-08-13 DIAGNOSIS — E1165 Type 2 diabetes mellitus with hyperglycemia: Secondary | ICD-10-CM | POA: Diagnosis not present

## 2019-08-13 DIAGNOSIS — K2100 Gastro-esophageal reflux disease with esophagitis, without bleeding: Secondary | ICD-10-CM

## 2019-08-13 DIAGNOSIS — Z87891 Personal history of nicotine dependence: Secondary | ICD-10-CM

## 2019-08-13 DIAGNOSIS — D3500 Benign neoplasm of unspecified adrenal gland: Secondary | ICD-10-CM

## 2019-08-13 LAB — CBC WITH DIFFERENTIAL/PLATELET
Basophils Absolute: 0.1 10*3/uL (ref 0.0–0.1)
Basophils Relative: 1 % (ref 0.0–3.0)
Eosinophils Absolute: 0.3 10*3/uL (ref 0.0–0.7)
Eosinophils Relative: 3.5 % (ref 0.0–5.0)
HCT: 44.2 % (ref 36.0–46.0)
Hemoglobin: 14.6 g/dL (ref 12.0–15.0)
Lymphocytes Relative: 36.8 % (ref 12.0–46.0)
Lymphs Abs: 3.4 10*3/uL (ref 0.7–4.0)
MCHC: 33 g/dL (ref 30.0–36.0)
MCV: 89.6 fl (ref 78.0–100.0)
Monocytes Absolute: 0.4 10*3/uL (ref 0.1–1.0)
Monocytes Relative: 4.4 % (ref 3.0–12.0)
Neutro Abs: 5 10*3/uL (ref 1.4–7.7)
Neutrophils Relative %: 54.3 % (ref 43.0–77.0)
Platelets: 119 10*3/uL — ABNORMAL LOW (ref 150.0–400.0)
RBC: 4.93 Mil/uL (ref 3.87–5.11)
RDW: 15.8 % — ABNORMAL HIGH (ref 11.5–15.5)
WBC: 9.2 10*3/uL (ref 4.0–10.5)

## 2019-08-13 LAB — HEPATIC FUNCTION PANEL
ALT: 16 U/L (ref 0–35)
AST: 16 U/L (ref 0–37)
Albumin: 4.3 g/dL (ref 3.5–5.2)
Alkaline Phosphatase: 96 U/L (ref 39–117)
Bilirubin, Direct: 0.1 mg/dL (ref 0.0–0.3)
Total Bilirubin: 0.6 mg/dL (ref 0.2–1.2)
Total Protein: 6.8 g/dL (ref 6.0–8.3)

## 2019-08-13 LAB — BASIC METABOLIC PANEL
BUN: 19 mg/dL (ref 6–23)
CO2: 27 mEq/L (ref 19–32)
Calcium: 9.7 mg/dL (ref 8.4–10.5)
Chloride: 104 mEq/L (ref 96–112)
Creatinine, Ser: 1.04 mg/dL (ref 0.40–1.20)
GFR: 51.84 mL/min — ABNORMAL LOW (ref >60.00)
Glucose, Bld: 127 mg/dL — ABNORMAL HIGH (ref 70–99)
Potassium: 5 mEq/L (ref 3.5–5.1)
Sodium: 141 mEq/L (ref 135–145)

## 2019-08-13 LAB — MICROALBUMIN / CREATININE URINE RATIO
Creatinine,U: 43 mg/dL
Microalb Creat Ratio: 1.6 mg/g (ref 0.0–30.0)
Microalb, Ur: <0.7 mg/dL (ref 0.0–1.9)

## 2019-08-13 LAB — LIPID PANEL
Cholesterol: 131 mg/dL (ref 0–200)
HDL: 44.1 mg/dL (ref >39.00)
LDL Cholesterol: 68 mg/dL (ref 0–99)
NonHDL: 86.94
Total CHOL/HDL Ratio: 3
Triglycerides: 97 mg/dL (ref 0.0–149.0)
VLDL: 19.4 mg/dL (ref 0.0–40.0)

## 2019-08-13 LAB — HEMOGLOBIN A1C: Hgb A1c MFr Bld: 8.1 % — ABNORMAL HIGH (ref 4.6–6.5)

## 2019-08-13 LAB — HM DIABETES FOOT EXAM

## 2019-08-13 LAB — TSH: TSH: 3.75 u[IU]/mL (ref 0.35–4.50)

## 2019-08-13 NOTE — Telephone Encounter (Signed)
Smoking history: current, 54 pack year

## 2019-08-13 NOTE — Progress Notes (Signed)
Patient ID: Ann Collins, female   DOB: 02-13-1945, 74 y.o.   MRN: [id number]   Subjective:    Patient ID: Ann Collins, female    DOB: 06-06-1945, 74 y.o.   MRN: [id number]  HPI This visit occurred during the SARS-CoV-2 public health emergency.  Safety protocols were in place, including screening questions prior to the visit, additional usage of staff PPE, and extensive cleaning of exam room while observing appropriate contact time as indicated for disinfecting solutions.  Patient here for a scheduled follow up.  She reports she is doing relatively well.  Handling stress.  Does not feel she needs any further intervention at this time.  Stays active.  No chest pain or sob reported.  No abdominal pain.  Bowels moving.  States sugars are doing better.  AM sugars averaging 117-130 and pm sugars averaging 150-160.  Trying to watch her diet.  Has her screening chest CT scheduled for 08/26/19.  She will schedule her mammogram.  Had cardiology f/u 07/09/19.  Stable.  Recommended f/u in 12 months.     Past Medical History:  Diagnosis Date  . Abdominal hernia   . CAD (coronary artery disease)    a. LHC 7/19: LM nl, p-mLAD 100% w/ right to left collaterals, OM2 90% (2 mm), 1st LPL 40%, medical management  . Diabetes mellitus ([place])   . Diverticulosis   . GERD (gastroesophageal reflux disease)   . Hematuria   . Hypercholesterolemia   . Hypertension   . Hypothyroidism   . Kidney stones    Past Surgical History:  Procedure Laterality Date  . APPENDECTOMY    . BREAST BIOPSY  1969   right  . CHOLECYSTECTOMY  2005  . COLONOSCOPY    . COLONOSCOPY WITH PROPOFOL N/A 09/19/2017   Procedure: COLONOSCOPY WITH PROPOFOL;  Surgeon: Toledo, Benay Pike, MD;  Location: [facility] ENDOSCOPY;  Service: Gastroenterology;  Laterality: N/A;  . ESOPHAGOGASTRODUODENOSCOPY (EGD) WITH PROPOFOL N/A 09/19/2017   Procedure: ESOPHAGOGASTRODUODENOSCOPY (EGD) WITH PROPOFOL;  Surgeon: Toledo, Benay Pike, MD;  Location: [facility]  ENDOSCOPY;  Service: Gastroenterology;  Laterality: N/A;  . LEFT HEART CATH AND CORONARY ANGIOGRAPHY Left 08/07/2017   Procedure: LEFT HEART CATH AND CORONARY ANGIOGRAPHY;  Surgeon: Wellington Hampshire, MD;  Location: [facility];  Service: Cardiovascular;  Laterality: Left;  . UPPER GI ENDOSCOPY     Family History  Problem Relation Age of Onset  . Brain cancer Mother        history of brain tumor  . Lung disease Father   . Diabetes Brother        x2  . Bipolar disorder Son   . Alcohol abuse Son   . Breast cancer Neg Hx   . Colon cancer Neg Hx    Social History   Socioeconomic History  . Marital status: Married    Spouse name: Not on file  . Number of children: Not on file  . Years of education: Not on file  . Highest education level: Not on file  Occupational History  . Not on file  Tobacco Use  . Smoking status: Current Every Day Smoker    Packs/day: 1.00    Years: 50.00    Pack years: 50.00  . Smokeless tobacco: Never Used  Vaping Use  . Vaping Use: Never used  Substance and Sexual Activity  . Alcohol use: No    Alcohol/week: 0.0 standard drinks  . Drug use: No  . Sexual activity: Not on file  Other Topics  Concern  . Not on file  Social History Narrative  . Not on file   Social Determinants of Health   Financial Resource Strain: Medium Risk  . Difficulty of Paying Living Expenses: Somewhat hard  Food Insecurity:   . Worried About Charity fundraiser in the Last Year:   . Arboriculturist in the Last Year:   Transportation Needs:   . Film/video editor (Medical):   Marland Kitchen Lack of Transportation (Non-Medical):   Physical Activity:   . Days of Exercise per Week:   . Minutes of Exercise per Session:   Stress:   . Feeling of Stress :   Social Connections:   . Frequency of Communication with Friends and Family:   . Frequency of Social Gatherings with Friends and Family:   . Attends Religious Services:   . Active Member of Clubs or Organizations:   .  Attends Archivist Meetings:   Marland Kitchen Marital Status:     Outpatient Encounter Medications as of 08/13/2019  Medication Sig  . aspirin 81 MG tablet Take 81 mg by mouth daily.  Marland Kitchen atorvastatin (LIPITOR) 40 MG tablet TAKE 1 TABLET BY MOUTH EVERY DAY  . carvedilol (COREG) 3.125 MG tablet TAKE 1 TABLET BY MOUTH TWICE DAILY  . cyanocobalamin 1000 MCG tablet Take 1,000 mcg by mouth as needed. Takes occasionally  . empagliflozin (JARDIANCE) 25 MG TABS tablet Take 25 mg by mouth daily before breakfast.  . Glucosamine 500 MG CAPS Take 1 capsule by mouth as needed.  Marland Kitchen glucose blood test strip Use as instructed to blood sugar twice daily  . insulin degludec (TRESIBA) 100 UNIT/ML FlexTouch Pen Inject 0.06 mLs (6 Units total) into the skin 2 (two) times daily.  . Insulin Pen Needle 32G X 6 MM MISC To fit TRESIBA PEN  . levothyroxine (SYNTHROID) 100 MCG tablet TAKE 1 TABLET(100 MCG) BY MOUTH DAILY  . lisinopril (ZESTRIL) 20 MG tablet Take 1 tablet (20 mg total) by mouth daily.  . pantoprazole (PROTONIX) 40 MG tablet Take 40 mg by mouth as needed.    No facility-administered encounter medications on file as of 08/13/2019.    Review of Systems  Constitutional: Negative for appetite change and unexpected weight change.  HENT: Negative for congestion and sinus pressure.   Respiratory: Negative for cough, chest tightness and shortness of breath.   Cardiovascular: Negative for chest pain, palpitations and leg swelling.  Gastrointestinal: Negative for abdominal pain, diarrhea, nausea and vomiting.  Genitourinary: Negative for difficulty urinating and dysuria.  Musculoskeletal: Negative for joint swelling and myalgias.  Skin: Negative for color change and rash.  Neurological: Negative for dizziness, light-headedness and headaches.  Psychiatric/Behavioral: Negative for agitation and dysphoric mood.       Objective:    Physical Exam Constitutional:      General: She is not in acute distress.     Appearance: Normal appearance.  HENT:     Head: Normocephalic and atraumatic.     Right Ear: External ear normal.     Left Ear: External ear normal.  Eyes:     General: No scleral icterus.       Right eye: No discharge.        Left eye: No discharge.     Conjunctiva/sclera: Conjunctivae normal.  Neck:     Thyroid: No thyromegaly.  Cardiovascular:     Rate and Rhythm: Normal rate and regular rhythm.  Pulmonary:     Effort: No respiratory distress.  Breath sounds: Normal breath sounds. No wheezing.  Abdominal:     General: Bowel sounds are normal.     Palpations: Abdomen is soft.     Tenderness: There is no abdominal tenderness.  Musculoskeletal:        General: No swelling or tenderness.     Cervical back: Neck supple. No tenderness.  Lymphadenopathy:     Cervical: No cervical adenopathy.  Skin:    Findings: No erythema or rash.  Neurological:     Mental Status: She is alert.  Psychiatric:        Mood and Affect: Mood normal.        Behavior: Behavior normal.     BP 122/70   Pulse 70   Temp 97.6 F (36.4 C)   Resp 16   Ht _0  (1.575 m)   Wt 144 lb (65.3 kg)   LMP 03/10/1991   SpO2 99%   BMI 26.34 kg/m  Wt Readings from Last 3 Encounters:  08/13/19 144 lb (65.3 kg)  07/09/19 146 lb 4 oz (66.3 kg)  05/31/19 145 lb (65.8 kg)     Lab Results  Component Value Date   WBC 9.2 08/13/2019   HGB 14.6 08/13/2019   HCT 44.2 08/13/2019   PLT 119.0 (L) 08/13/2019   GLUCOSE 127 (H) 08/13/2019   CHOL 131 08/13/2019   TRIG 97.0 08/13/2019   HDL 44.10 08/13/2019   LDLDIRECT 71 10/18/2017   LDLCALC 68 08/13/2019   ALT 16 08/13/2019   AST 16 08/13/2019   NA 141 08/13/2019   K 5.0 08/13/2019   CL 104 08/13/2019   CREATININE 1.04 08/13/2019   BUN 19 08/13/2019   CO2 27 08/13/2019   TSH 3.75 08/13/2019   INR 0.87 08/07/2017   HGBA1C 8.1 (H) 08/13/2019   MICROALBUR <0.7 08/13/2019    CT ADRENAL ABD WO  Result Date: 06/18/2019 CLINICAL DATA:  Adrenal mass.  EXAM: CT ABDOMEN WITHOUT CONTRAST TECHNIQUE: Multidetector CT imaging of the abdomen was performed following the standard protocol without IV contrast. COMPARISON:  Chest CT on 06/26/2015 FINDINGS: Lower chest: No acute findings. Hepatobiliary: No masses visualized on this unenhanced exam. Prior cholecystectomy. No evidence of biliary obstruction. Pancreas: No mass or inflammatory process visualized on this unenhanced exam. Spleen:  Within normal limits in size. Adrenals/Urinary tract: A low-attenuation left adrenal mass is again seen measuring 1.6 x 1.1 cm. This measures 10 Hounsfield units and is stable since prior chest CT, consistent with benign adenoma. Moderate atrophy and scarring of the left kidney is noted as well as several small fluid attenuation cysts. No evidence of renal calculi or hydronephrosis. Stomach/Bowel: Visualized portion unremarkable. Vascular/Lymphatic: No pathologically enlarged lymph nodes identified. No evidence of abdominal aortic aneurysm. Aortic atherosclerosis noted. Other:  None. Musculoskeletal:  No suspicious bone lesions identified. IMPRESSION: 1. Small benign left adrenal adenoma, stable since prior chest CT in 2017. 2. No acute findings. Aortic Atherosclerosis (ICD10-I70.0). Electronically Signed   By: Marlaine Hind M.D.   On: 06/18/2019 10:40       Assessment & Plan:   Problem List Items Addressed This Visit    Adrenal adenoma    Stable CT - adrenal stable.       Aortic atherosclerosis (HCC)    On lipitor.        Breast cancer screening    She will schedule mammogram.        Diabetes mellitus ([place])    Sugars as outlined.  Discussed diet and exercise.  Continue on tresiba and jardiance.  Follow met b and a1c.       Relevant Orders   Hemoglobin A1c (Completed)   Microalbumin / creatinine urine ratio (Completed)   GERD (gastroesophageal reflux disease)    Controlled on protonix.        Hypercholesterolemia    On lipitor.  Low cholesterol diet and  exercise.  Follow lipid panel and liver function tests.   Lab Results  Component Value Date   CHOL 131 08/13/2019   HDL 44.10 08/13/2019   LDLCALC 68 08/13/2019   LDLDIRECT 71 10/18/2017   TRIG 97.0 08/13/2019   CHOLHDL 3 08/13/2019        Relevant Orders   Hepatic function panel (Completed)   Lipid panel (Completed)   Hypertension   Relevant Orders   Basic metabolic panel (Completed)   Hypothyroidism    On thyroid replacement.  Follow tsh.        Relevant Orders   TSH (Completed)   Personal history of tobacco use, presenting hazards to health    Have discussed the need to quit smoking.  Follow.       Thrombocytopenia ([place])    Follow cbc.       Relevant Orders   CBC with Differential/Platelet (Completed)    Other Visit Diagnoses    Visit for screening mammogram    -  Primary   Relevant Orders   MM 3D SCREEN BREAST BILATERAL       Einar Pheasant, MD

## 2019-08-13 NOTE — Addendum Note (Signed)
Addended by: Lieutenant Diego on: 08/13/2019 02:51 PM   Modules accepted: Orders

## 2019-08-19 ENCOUNTER — Encounter: Payer: Self-pay | Admitting: Internal Medicine

## 2019-08-19 DIAGNOSIS — D3500 Benign neoplasm of unspecified adrenal gland: Secondary | ICD-10-CM | POA: Insufficient documentation

## 2019-08-19 DIAGNOSIS — Z1239 Encounter for other screening for malignant neoplasm of breast: Secondary | ICD-10-CM | POA: Insufficient documentation

## 2019-08-19 NOTE — Assessment & Plan Note (Signed)
Stable CT - adrenal stable.

## 2019-08-19 NOTE — Assessment & Plan Note (Signed)
Sugars as outlined.  Discussed diet and exercise.  Continue on tresiba and jardiance.  Follow met b and a1c.

## 2019-08-19 NOTE — Assessment & Plan Note (Signed)
Follow cbc.  

## 2019-08-19 NOTE — Assessment & Plan Note (Signed)
On lipitor.  Low cholesterol diet and exercise.  Follow lipid panel and liver function tests.   Lab Results  Component Value Date   CHOL 131 08/13/2019   HDL 44.10 08/13/2019   LDLCALC 68 08/13/2019   LDLDIRECT 71 10/18/2017   TRIG 97.0 08/13/2019   CHOLHDL 3 08/13/2019

## 2019-08-19 NOTE — Assessment & Plan Note (Signed)
Controlled on protonix.   

## 2019-08-19 NOTE — Assessment & Plan Note (Signed)
She will schedule mammogram.  

## 2019-08-19 NOTE — Assessment & Plan Note (Signed)
Have discussed the need to quit smoking.  Follow.  

## 2019-08-19 NOTE — Assessment & Plan Note (Signed)
On lipitor

## 2019-08-19 NOTE — Assessment & Plan Note (Signed)
On thyroid replacement.  Follow tsh.  

## 2019-08-21 ENCOUNTER — Other Ambulatory Visit: Payer: Medicare Other

## 2019-08-26 ENCOUNTER — Ambulatory Visit
Admission: RE | Admit: 2019-08-26 | Discharge: 2019-08-26 | Disposition: A | Discharge status: Home / Self Care | Payer: Medicare Other | Source: Ambulatory Visit | Attending: Oncology | Admitting: Oncology

## 2019-08-26 ENCOUNTER — Other Ambulatory Visit: Payer: Self-pay

## 2019-08-26 DIAGNOSIS — Z122 Encounter for screening for malignant neoplasm of respiratory organs: Secondary | ICD-10-CM | POA: Diagnosis not present

## 2019-08-26 DIAGNOSIS — Z87891 Personal history of nicotine dependence: Secondary | ICD-10-CM | POA: Diagnosis not present

## 2019-08-29 ENCOUNTER — Encounter: Payer: Self-pay | Admitting: *Deleted

## 2019-09-10 ENCOUNTER — Other Ambulatory Visit: Payer: Self-pay | Admitting: Physician Assistant

## 2019-09-16 ENCOUNTER — Ambulatory Visit (INDEPENDENT_AMBULATORY_CARE_PROVIDER_SITE_OTHER): Payer: Medicare Other | Admitting: Pharmacist

## 2019-09-16 DIAGNOSIS — E1165 Type 2 diabetes mellitus with hyperglycemia: Secondary | ICD-10-CM | POA: Diagnosis not present

## 2019-09-16 DIAGNOSIS — E7800 Pure hypercholesterolemia, unspecified: Secondary | ICD-10-CM

## 2019-09-16 DIAGNOSIS — I10 Essential (primary) hypertension: Secondary | ICD-10-CM | POA: Diagnosis not present

## 2019-09-16 NOTE — Patient Instructions (Addendum)
Ann Collins,   It was great talking with you today!  It sounds like your fasting sugars are generally at goal, so I wonder if it is the spikes that you have after meals that are the problem time.   Start checking your blood sugar:  1) Every morning before breakfast. Our goal fasting sugar is <130 2) 2 hours after a meal. Alternate between different meals. Our goal after meal sugar is <180.   Moving forward, we may consider adding a medication to better help those after meal readings. We may be able to replace insulin with it. This would be a once weekly injectable called Ozempic.  Please feel free to call me with any questions or concerns!  [name], PharmD [phone]  Visit Information  Goals Addressed              This Visit's Progress     Patient Stated   .  "I need to get my blood sugar better controlled" (pt-stated)        CARE PLAN ENTRY (see longtitudinal plan of care for additional care plan information)  Current Barriers:  . Social, financial, and community resources:  o Spends a lot of time babysitting her grandson . Diabetes: uncontrolled; most recent L2G 4.0%; complicated by CKD, HTN, tobacco abuse . Last eGFR ~51 mL/min . Current antihyperglycemic regimen: Tresiba 12 units daily, Jardiance 25 mg daily o Notes that she stopped metformin back in summer of 2019 because "they told me to stop it at the hospital" No documented reason why, metformin was removed from her medication list in 07/2017 d/t patient not taking. . Denies hypoglycemia.  . Current blood glucose readings:  o Fasting: has not been checking recently, but most readings 120-130s o Afternoons: remembers one reading of ~158 . Current meal patterns: o Breakfast: this morning french toast w/ sugar free syrup, 2 pieces; sometimes will eat oatmeal, sometimes will take her grandson to get fries/hash browns, but will sometimes get biscuit w/ sausage or bacon; sometimes waffle; drinks diet coke or diet  pepsi o Lunch: notes that it is variable; sometimes 1/2 BLT sandwich; will also take her grandson to the play; diet drink o Supper: BLT, anything w/ tomatoes; ham and lettuce sandwich;   o Desserts: not big on dessert . Cardiovascular risk reduction: o Current hypertensive regimen: lisinopril 20 mg daily, carvedilol 3.125 mg BID; last clinic BP at goal o Current hyperlipidemia regimen: atorvastatin 40 mg daily, last LDL ~ 70 o Current antiplatelet regimen: ASA 81 mg daily   . Acid reflux: pantoprazole 40 mg PRN; reports that this PRN use is working for her symptoms . Wrist inflammation/pain: glucosamine PRN  . Supplements: Vitamin B12 1000 units . Tobacco use: reports smoking ~ 1 ppd currently. Reports that she struggles with anger after her son's death, and she worries that stopping smoking will negatively impact mood and relationships. Reports there is also an element of habit, ie when she drives. Notes that she has quit before, but always relapsed.   Pharmacist Clinical Goal(s):  Marland Kitchen Over the next 90 days, [name] and primary care provider to address optimized glycemic management  Interventions: . Comprehensive medication review performed, medication list updated in electronic medical record . Inter-disciplinary care team collaboration (see longitudinal plan of care) . Reviewed A1c. Discussed goal A1c, goal fasting, and goal 2 hour post prandial glucose. Reviewed that we need more data to be able to appropriate adjust pharmacotherapy to target better glucose control. Reports that  occasional fastings seem to be at goal, but not checking enough 2 hour post prandial, so that may be the elevated times. Patient will start checking BID, fasting and 2 hour post prandial, alternating between different meals, to determine patterns.  . Moving forward, likely consider GLP1 to target improve post prandial readings. Pending tolerability, may be able to reduce/eliminate basal insulin.    Patient Self Care Activities:  . Patient will check blood glucose BID-TID, document, and provide at future appointments . Patient will take medications as prescribed . Patient will contact provider with any episodes of hypoglycemia . Patient will report any questions or concerns to provider  . Patient will collaborate on completion of patient assistance application  Please see past updates related to this goal by clicking on the "Past Updates" button in the selected goal         The patient verbalized understanding of instructions provided today and agreed to receive a mailed copy of patient instruction and/or educational materials.   Plan:  - Scheduled f/u call in ~ 4 weeks  [name], PharmD, [place], [place] Pharmacist [place] [phone]

## 2019-09-16 NOTE — Chronic Care Management (AMB) (Signed)
Chronic Care Management   Follow Up Note   09/16/2019 Name: Ann Collins MRN: [id number] DOB: Mar 15, 1945  Referred by: Einar Pheasant, MD Reason for referral : Chronic Care Management (Medication Management)   Ann Collins is a 74 y.o. year old female who is a primary care patient of Einar Pheasant, MD. The CCM team was consulted for assistance with chronic disease management and care coordination needs.    Contacted patient for medication management review.  Review of patient status, including review of consultants reports, relevant laboratory and other test results, and collaboration with appropriate care team members and the patient's provider was performed as part of comprehensive patient evaluation and provision of chronic care management services.    SDOH (Social Determinants of Health) assessments performed: Yes See Care Plan activities for detailed interventions related to SDOH)  SDOH Interventions     Most Recent Value  SDOH Interventions  Financial Strain Interventions Other (Comment)  [medication assistance]       Outpatient Encounter Medications as of 09/16/2019  Medication Sig Note   aspirin 81 MG tablet Take 81 mg by mouth daily.    atorvastatin (LIPITOR) 40 MG tablet TAKE 1 TABLET BY MOUTH EVERY DAY    carvedilol (COREG) 3.125 MG tablet TAKE 1 TABLET BY MOUTH TWICE DAILY    cyanocobalamin 1000 MCG tablet Take 1,000 mcg by mouth as needed. Takes occasionally    empagliflozin (JARDIANCE) 25 MG TABS tablet Take 25 mg by mouth daily before breakfast.    Glucosamine 500 MG CAPS Take 1 capsule by mouth as needed. 09/20/2018: Taking for wrist pain   glucose blood test strip Use as instructed to blood sugar twice daily    insulin degludec (TRESIBA) 100 UNIT/ML FlexTouch Pen Inject 0.06 mLs (6 Units total) into the skin 2 (two) times daily. 09/16/2019: 12 units QAM   Insulin Pen Needle 32G X 6 MM MISC To fit TRESIBA PEN    levothyroxine (SYNTHROID) 100 MCG  tablet TAKE 1 TABLET(100 MCG) BY MOUTH DAILY    lisinopril (ZESTRIL) 20 MG tablet Take 1 tablet (20 mg total) by mouth daily.    pantoprazole (PROTONIX) 40 MG tablet Take 40 mg by mouth as needed.  07/11/2019: PRN    No facility-administered encounter medications on file as of 09/16/2019.     Objective:   Goals Addressed              This Visit's Progress     Patient Stated     "I need to get my blood sugar better controlled" (pt-stated)        CARE PLAN ENTRY (see longtitudinal plan of care for additional care plan information)  Current Barriers:   Social, financial, and community resources:  o Spends a lot of time babysitting her grandson  Diabetes: uncontrolled; most recent O7F 6.4%; complicated by CKD, HTN, tobacco abuse  Last eGFR ~51 mL/min  Current antihyperglycemic regimen: Tresiba 12 units daily, Jardiance 25 mg daily o Notes that she stopped metformin back in summer of 2019 because "they told me to stop it at the hospital" No documented reason why, metformin was removed from her medication list in 07/2017 d/t patient not taking.  Denies hypoglycemia.   Current blood glucose readings:  o Fasting: has not been checking recently, but most readings 120-130s o Afternoons: remembers one reading of ~158  Current meal patterns: o Breakfast: this morning french toast w/ sugar free syrup, 2 pieces; sometimes will eat oatmeal, sometimes will take her grandson to  get fries/hash browns, but will sometimes get biscuit w/ sausage or bacon; sometimes waffle; drinks diet coke or diet pepsi o Lunch: notes that it is variable; sometimes 1/2 BLT sandwich; will also take her grandson to the play; diet drink o Supper: BLT, anything w/ tomatoes; ham and lettuce sandwich;   o Desserts: not big on dessert  Cardiovascular risk reduction: o Current hypertensive regimen: lisinopril 20 mg daily, carvedilol 3.125 mg BID; last clinic BP at goal o Current hyperlipidemia regimen: atorvastatin  40 mg daily, last LDL ~ 70 o Current antiplatelet regimen: ASA 81 mg daily    Acid reflux: pantoprazole 40 mg PRN; reports that this PRN use is working for her symptoms  Wrist inflammation/pain: glucosamine PRN   Supplements: Vitamin B12 1000 units  Tobacco use: reports smoking ~ 1 ppd currently. Reports that she struggles with anger after her son's death, and she worries that stopping smoking will negatively impact mood and relationships. Reports there is also an element of habit, ie when she drives. Notes that she has quit before, but always relapsed.   Pharmacist Clinical Goal(s):   Over the next 90 days, [name] and primary care provider to address optimized glycemic management  Interventions:  Comprehensive medication review performed, medication list updated in electronic medical record  Inter-disciplinary care team collaboration (see longitudinal plan of care)  Reviewed A1c. Discussed goal A1c, goal fasting, and goal 2 hour post prandial glucose. Reviewed that we need more data to be able to appropriate adjust pharmacotherapy to target better glucose control. Reports that occasional fastings seem to be at goal, but not checking enough 2 hour post prandial, so that may be the elevated times. Patient will start checking BID, fasting and 2 hour post prandial, alternating between different meals, to determine patterns.   Moving forward, likely consider GLP1 to target improve post prandial readings. Pending tolerability, may be able to reduce/eliminate basal insulin.   Patient Self Care Activities:   Patient will check blood glucose BID-TID, document, and provide at future appointments  Patient will take medications as prescribed  Patient will contact provider with any episodes of hypoglycemia  Patient will report any questions or concerns to provider   Patient will collaborate on completion of patient assistance application  Please see past updates  related to this goal by clicking on the "Past Updates" button in the selected goal          Plan:  - Scheduled f/u call in ~ 4 weeks  [name], PharmD, [place], [place] Pharmacist [place] [phone]

## 2019-09-17 NOTE — Progress Notes (Signed)
I have reviewed the above note and agree. I was available to the pharmacist for consultation.  Kyzer Blowe, MD 

## 2019-10-15 ENCOUNTER — Ambulatory Visit: Payer: Medicare Other | Admitting: Pharmacist

## 2019-10-15 DIAGNOSIS — E1165 Type 2 diabetes mellitus with hyperglycemia: Secondary | ICD-10-CM

## 2019-10-15 DIAGNOSIS — E7800 Pure hypercholesterolemia, unspecified: Secondary | ICD-10-CM

## 2019-10-15 DIAGNOSIS — I10 Essential (primary) hypertension: Secondary | ICD-10-CM

## 2019-10-15 MED ORDER — OZEMPIC (0.25 OR 0.5 MG/DOSE) 2 MG/1.5ML ~~LOC~~ SOPN: PEN_INJECTOR | SUBCUTANEOUS | 0 refills | Status: DC

## 2019-10-15 NOTE — Patient Instructions (Signed)
Ann Collins,   It was great talking to you today!  I have faxed in the order for Ozempic today. It can take ~2-3 weeks for it to arrive to our office, but we will let you know when it is ready for pick up.   You will inject 0.25 mg once weekly for 4 weeks. On the 5th week, you will increase to 0.5 mg weekly.   When you start Ozempic, reduce Tresiba to 8 units daily. Moving forward, we will likely be able to stop this medication.   Keep checking fasting sugars and sugars about 2 hours after meals. This will help [place] evaluate the impact of this medication change on your sugar readings.   Call me with any questions or concerns in the meantime!   [name], PharmD [phone]    Visit Information  Goals Addressed              This Visit's Progress     Patient Stated     "I need to get my blood sugar better controlled" (pt-stated)        CARE PLAN ENTRY (see longtitudinal plan of care for additional care plan information)  Current Barriers:   Social, financial, and community resources:  o Continues to enjoy babysitting.   Diabetes: uncontrolled; most recent G6Y 4.0%; complicated by CKD, HTN, tobacco abuse  Last eGFR ~51 mL/min  Current antihyperglycemic regimen: Tresiba 12 units daily, Jardiance 25 mg daily o Notes that she stopped metformin back in summer of 2019 because "they told me to stop it at the hospital" No documented reason why, metformin was removed from her medication list in 07/2017 d/t patient not taking.  Denies hypoglycemia  Current blood glucose readings:  o Fasting: 104 this morning, other readings <130 o 2 hour after meals: 200-250s  Cardiovascular risk reduction: o Current hypertensive regimen: lisinopril 20 mg daily, carvedilol 3.125 mg BID; last clinic BP at goal o Current hyperlipidemia regimen: atorvastatin 40 mg daily, last LDL ~ 70 o Current antiplatelet regimen: ASA 81 mg daily    Acid reflux: pantoprazole 40 mg PRN  Wrist  inflammation/pain: glucosamine PRN   Supplements: Vitamin B12 1000 units  Tobacco use: reports smoking ~ 1 ppd currently. Worries about being unable to control her mood w/o smoking. Not interested in quitting at this time   Pharmacist Clinical Goal(s):   Over the next 90 days, [name] and primary care provider to address optimized glycemic management  Interventions:  Comprehensive medication review performed, medication list updated in electronic medical record  Inter-disciplinary care team collaboration (see longitudinal plan of care)  Reviewed that we need to better target post-prandial glucose. Discussed transition from basal insulin to GLP1. She is in agreement. As patient has [employer] patient assistance approval, will send script for Ozempic. Start Ozempic 0.25 mg once weekly x 4 weeks, then increase to 0.5 mg weekly. Decrease Tresiba to 8 units daily when starting Ozempic. Will likely be able to d/c moving forward if Ozempic is tolerated. Patient verbalizes understanding.   Patient is up to date on fill hx for statin and RAAS.  Patient Self Care Activities:   Patient will check blood glucose BID-TID, document, and provide at future appointments  Patient will take medications as prescribed  Patient will contact provider with any episodes of hypoglycemia  Patient will report any questions or concerns to provider   Patient will collaborate on completion of patient assistance application  Please see past updates related to this  goal by clicking on the "Past Updates" button in the selected goal         The patient verbalized understanding of instructions provided today and agreed to receive a mailed copy of patient instruction and/or educational materials.  Plan:  - Scheduled f/u call in ~ 4 weeks  [name], PharmD, [place], [place] Pharmacist [place] [phone]

## 2019-10-15 NOTE — Progress Notes (Signed)
I have reviewed the above note and agree. I was available to the pharmacist for consultation.  Henna Derderian, MD 

## 2019-10-15 NOTE — Chronic Care Management (AMB) (Signed)
Chronic Care Management   Follow Up Note   10/15/2019 Name: Ann Collins MRN: [id number] DOB: March 11, 1945  Referred by: Einar Pheasant, MD Reason for referral : Chronic Care Management (Medication Management)   Ann Collins is a 74 y.o. year old female who is a primary care patient of Einar Pheasant, MD. The CCM team was consulted for assistance with chronic disease management and care coordination needs.    Contacted patient for medication management review.  Review of patient status, including review of consultants reports, relevant laboratory and other test results, and collaboration with appropriate care team members and the patient's provider was performed as part of comprehensive patient evaluation and provision of chronic care management services.    SDOH (Social Determinants of Health) assessments performed: Yes See Care Plan activities for detailed interventions related to SDOH)  SDOH Interventions     Most Recent Value  SDOH Interventions  SDOH Interventions for the Following Domains Tobacco  Financial Strain Interventions Other (Comment)  [manufacturer assistance]  Tobacco Interventions Other (Comment)  [patient not interested in discussion at this time]       Outpatient Encounter Medications as of 10/15/2019  Medication Sig Note   aspirin 81 MG tablet Take 81 mg by mouth daily.    atorvastatin (LIPITOR) 40 MG tablet TAKE 1 TABLET BY MOUTH EVERY DAY    carvedilol (COREG) 3.125 MG tablet TAKE 1 TABLET BY MOUTH TWICE DAILY    cyanocobalamin 1000 MCG tablet Take 1,000 mcg by mouth as needed. Takes occasionally    empagliflozin (JARDIANCE) 25 MG TABS tablet Take 25 mg by mouth daily before breakfast.    Glucosamine 500 MG CAPS Take 1 capsule by mouth as needed. 09/20/2018: Taking for wrist pain   glucose blood test strip Use as instructed to blood sugar twice daily    insulin degludec (TRESIBA) 100 UNIT/ML FlexTouch Pen Inject 0.06 mLs (6 Units total) into the  skin 2 (two) times daily. 09/16/2019: 12 units QAM   Insulin Pen Needle 32G X 6 MM MISC To fit TRESIBA PEN    levothyroxine (SYNTHROID) 100 MCG tablet TAKE 1 TABLET(100 MCG) BY MOUTH DAILY    lisinopril (ZESTRIL) 20 MG tablet Take 1 tablet (20 mg total) by mouth daily.    pantoprazole (PROTONIX) 40 MG tablet Take 40 mg by mouth as needed.  07/11/2019: PRN    Semaglutide,0.25 or 0.5MG/DOS, (OZEMPIC, 0.25 OR 0.5 MG/DOSE,) 2 MG/1.5ML SOPN Inject 0.25 mg once weekly x 4 weeks then increase to 0.5 mg weekly    No facility-administered encounter medications on file as of 10/15/2019.     Objective:   Goals Addressed              This Visit's Progress     Patient Stated     "I need to get my blood sugar better controlled" (pt-stated)        CARE PLAN ENTRY (see longtitudinal plan of care for additional care plan information)  Current Barriers:   Social, financial, and community resources:  o Continues to enjoy babysitting.   Diabetes: uncontrolled; most recent V7B 9.3%; complicated by CKD, HTN, tobacco abuse  Last eGFR ~51 mL/min  Current antihyperglycemic regimen: Tresiba 12 units daily, Jardiance 25 mg daily o Notes that she stopped metformin back in summer of 2019 because "they told me to stop it at the hospital" No documented reason why, metformin was removed from her medication list in 07/2017 d/t patient not taking.  Denies hypoglycemia  Current blood glucose  readings:  o Fasting: 104 this morning, other readings <130 o 2 hour after meals: 200-250s  Cardiovascular risk reduction: o Current hypertensive regimen: lisinopril 20 mg daily, carvedilol 3.125 mg BID; last clinic BP at goal o Current hyperlipidemia regimen: atorvastatin 40 mg daily, last LDL ~ 70 o Current antiplatelet regimen: ASA 81 mg daily    Acid reflux: pantoprazole 40 mg PRN  Wrist inflammation/pain: glucosamine PRN   Supplements: Vitamin B12 1000 units  Tobacco use: reports smoking ~ 1 ppd currently.  Worries about being unable to control her mood w/o smoking. Not interested in quitting at this time   Pharmacist Clinical Goal(s):   Over the next 90 days, [name] and primary care provider to address optimized glycemic management  Interventions:  Comprehensive medication review performed, medication list updated in electronic medical record  Inter-disciplinary care team collaboration (see longitudinal plan of care)  Reviewed that we need to better target post-prandial glucose. Discussed transition from basal insulin to GLP1. She is in agreement. As patient has [employer] patient assistance approval, will send script for Ozempic. Start Ozempic 0.25 mg once weekly x 4 weeks, then increase to 0.5 mg weekly. Decrease Tresiba to 8 units daily when starting Ozempic. Will likely be able to d/c moving forward if Ozempic is tolerated. Patient verbalizes understanding.   Patient is up to date on fill hx for statin and RAAS.  Patient Self Care Activities:   Patient will check blood glucose BID-TID, document, and provide at future appointments  Patient will take medications as prescribed  Patient will contact provider with any episodes of hypoglycemia  Patient will report any questions or concerns to provider   Patient will collaborate on completion of patient assistance application  Please see past updates related to this goal by clicking on the "Past Updates" button in the selected goal          Plan:  - Scheduled f/u call in ~ 4 weeks  [name], PharmD, [place], [place] Pharmacist [place] [phone]

## 2019-10-29 ENCOUNTER — Encounter: Payer: Self-pay | Admitting: Internal Medicine

## 2019-11-11 DIAGNOSIS — Z23 Encounter for immunization: Secondary | ICD-10-CM | POA: Diagnosis not present

## 2019-11-19 ENCOUNTER — Ambulatory Visit (INDEPENDENT_AMBULATORY_CARE_PROVIDER_SITE_OTHER): Payer: Medicare Other | Admitting: Pharmacist

## 2019-11-19 DIAGNOSIS — E1165 Type 2 diabetes mellitus with hyperglycemia: Secondary | ICD-10-CM

## 2019-11-19 DIAGNOSIS — I10 Essential (primary) hypertension: Secondary | ICD-10-CM | POA: Diagnosis not present

## 2019-11-19 MED ORDER — LISINOPRIL 20 MG PO TABS: 20.0000 mg | ORAL_TABLET | Freq: Every day | ORAL | 3 refills | Status: DC

## 2019-11-19 MED ORDER — ATORVASTATIN CALCIUM 40 MG PO TABS: 40.0000 mg | ORAL_TABLET | Freq: Every day | ORAL | 3 refills | Status: DC

## 2019-11-19 NOTE — Patient Instructions (Signed)
Ann Collins,   It was great talking with you today!  Start Ozempic 0.25 mg once weekly. Inject 0.25 mg weekly for 4 weeks. On the 5th week, increase to 0.5 mg weekly. When you start Ozempic, reduce Tresiba to 8 units daily. If you still have low blood sugars, please call me and we can further reduce the [place]. Continue Jardiance 25 mg daily.   The less insulin we can use, the better! Insulin has a risk of weight gain and low blood sugars.   Ozempic works in Scientist, research (life sciences), and may cause a bit of stomach upset when first starting. This is generally a feeling of "fullness", like you ate too much. Cutting down on your portion sizes should help with this.   We will talk about reapplying for patient assistance for Ann Collins, and Ann Collins when we talk in December (see Upcoming Appointments).   Take care!  Ann Collins, PharmD [phone]  Visit Information  Goals Addressed              This Visit's Progress     Patient Stated   .  "I need to get my blood sugar better controlled" (pt-stated)        CARE PLAN ENTRY (see longtitudinal plan of care for additional care plan information)  Current Barriers:  . 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 Notes stress w/ family lately, but that she has a good support system . Diabetes: uncontrolled; most recent Y4I 3.4%; complicated by CKD, HTN, tobacco abuse . Last eGFR ~51 mL/min . Current antihyperglycemic regimen: Tresiba 12 units daily, Jardiance 25 mg daily- prescribed Ozempic at last visit, script sent to [employer], but medication was not shipped.  o Notes that she stopped metformin back in summer of 2019 because "they told me to stop it at the hospital" No documented reason why, metformin was removed from her medication list in 07/2017 d/t patient not taking. . Denies hypoglycemia . Current blood glucose readings: reports that fastings continue to be controlled, but post prandials  are elevated . Cardiovascular risk reduction: o Current hypertensive regimen: lisinopril 20 mg daily, carvedilol 3.125 mg BID - Last clinic BP well controlled; due for refill on lisinopril- last filled 07/2019 for 90 day supply, but looks like script ran out o Current hyperlipidemia regimen: atorvastatin 40 mg daily, last LDL ~ 70; due for refill o Current antiplatelet regimen: ASA 81 mg daily   . Acid reflux: pantoprazole 40 mg PRN . Wrist inflammation/pain: glucosamine PRN  . Supplements: Vitamin B12 1000 units . Tobacco use: ~ 1 ppd currently; not interested in working on cessation at this time d/t other stressors in her life.   Pharmacist Clinical Goal(s):  [place] Kitchen Over the next 90 days, [name] and primary care provider to address optimized glycemic management  Interventions: . Comprehensive medication review performed, medication list updated in electronic medical record . Inter-disciplinary care team collaboration (see longitudinal plan of care) . [place] regarding Ozempic order submitted 10/15/19. Prepared sample for patient. Counseled on mechanism of action, side effects. When Ozempic is started, decrease Tresiba to 8 units daily to reduce risk of hypoglycemia. Goal of insulin minimization/elimination  . Patient due for refill for lisinopril and atorvastatin. Refills sent.  . Provided empathetic listening as she discussed recent stressors.   Patient Self Care Activities:  . Patient will check blood glucose BID, document, and provide at future appointments . Patient will take medications  as prescribed . Patient will contact provider with any episodes of hypoglycemia . Patient will report any questions or concerns to provider  . Patient will collaborate on completion of patient assistance application  Please see past updates related to this goal by clicking on the "Past Updates" button in the selected goal         Print copy of patient instructions  provided.    Plan:  - PCP f/u in ~ 4 weeks. Scheduled f/u with me ~6 weeks after that  [place], PharmD, [place], [place] Pharmacist [place] [phone]

## 2019-11-19 NOTE — Chronic Care Management (AMB) (Signed)
Chronic Care Management   Follow Up Note   11/19/2019 Name: [name] MRN: [id number] DOB: Jan 07, 1946  Referred by: Einar Pheasant, MD Reason for referral : Chronic Care Management (Medication Management)   Ann Collins is a 74 y.o. year old female who is a primary care patient of Einar Pheasant, MD. The CCM team was consulted for assistance with chronic disease management and care coordination needs.    Contacted patient for medication management review.   Review of patient status, including review of consultants reports, relevant laboratory and other test results, and collaboration with appropriate care team members and the patient's provider was performed as part of comprehensive patient evaluation and provision of chronic care management services.    SDOH (Social Determinants of Health) assessments performed: Yes See Care Plan activities for detailed interventions related to SDOH)   SDOH Interventions     Most Recent Value  SDOH Interventions  Financial Strain Interventions Other (Comment)  [manufacturer assistance]  Stress Interventions Provide Counseling       Outpatient Encounter Medications as of 11/19/2019  Medication Sig Note   aspirin 81 MG tablet Take 81 mg by mouth daily.    atorvastatin (LIPITOR) 40 MG tablet Take 1 tablet (40 mg total) by mouth daily.    carvedilol (COREG) 3.125 MG tablet TAKE 1 TABLET BY MOUTH TWICE DAILY    cyanocobalamin 1000 MCG tablet Take 1,000 mcg by mouth as needed. Takes occasionally    empagliflozin (JARDIANCE) 25 MG TABS tablet Take 25 mg by mouth daily before breakfast.    Glucosamine 500 MG CAPS Take 1 capsule by mouth as needed. 09/20/2018: Taking for wrist pain   glucose blood test strip Use as instructed to blood sugar twice daily    insulin degludec (TRESIBA) 100 UNIT/ML FlexTouch Pen Inject 0.06 mLs (6 Units total) into the skin 2 (two) times daily. 09/16/2019: 12 units QAM   Insulin Pen Needle 32G X 6 MM MISC  To fit TRESIBA PEN    levothyroxine (SYNTHROID) 100 MCG tablet TAKE 1 TABLET(100 MCG) BY MOUTH DAILY    lisinopril (ZESTRIL) 20 MG tablet Take 1 tablet (20 mg total) by mouth daily.    pantoprazole (PROTONIX) 40 MG tablet Take 40 mg by mouth as needed.  07/11/2019: PRN    [DISCONTINUED] atorvastatin (LIPITOR) 40 MG tablet TAKE 1 TABLET BY MOUTH EVERY DAY    [DISCONTINUED] lisinopril (ZESTRIL) 20 MG tablet Take 1 tablet (20 mg total) by mouth daily.    Semaglutide,0.25 or 0.5MG/DOS, (OZEMPIC, 0.25 OR 0.5 MG/DOSE,) 2 MG/1.5ML SOPN Inject 0.25 mg once weekly x 4 weeks then increase to 0.5 mg weekly (Patient not taking: Reported on 11/19/2019)    No facility-administered encounter medications on file as of 11/19/2019.     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 Notes stress w/ family lately, but that she has a good support system  Diabetes: uncontrolled; most recent W9N 9.8%; complicated by CKD, HTN, tobacco abuse  Last eGFR ~51 mL/min  Current antihyperglycemic regimen: Tresiba 12 units daily, Jardiance 25 mg daily- prescribed Ozempic at  last visit, script sent to [employer], but medication was not shipped.  o Notes that she stopped metformin back in summer of 2019 because "they told me to stop it at the hospital" No documented reason why, metformin was removed from her medication list in 07/2017 d/t patient not taking.  Denies hypoglycemia  Current blood glucose readings: reports that fastings continue to be controlled, but post prandials are elevated  Cardiovascular risk reduction: o Current hypertensive regimen: lisinopril 20 mg daily, carvedilol 3.125 mg BID - Last  clinic BP well controlled; due for refill on lisinopril- last filled 07/2019 for 90 day supply, but looks like script ran out o Current hyperlipidemia regimen: atorvastatin 40 mg daily, last LDL ~ 70; due for refill o Current antiplatelet regimen: ASA 81 mg daily    Acid reflux: pantoprazole 40 mg PRN  Wrist inflammation/pain: glucosamine PRN   Supplements: Vitamin B12 1000 units  Tobacco use: ~ 1 ppd currently; not interested in working on cessation at this time d/t other stressors in her life.   Pharmacist Clinical Goal(s):   Over the next 90 days, [name] and primary care provider to address optimized glycemic management  Interventions:  Comprehensive medication review performed, medication list updated in electronic medical record  Inter-disciplinary care team collaboration (see longitudinal plan of care)  [place] regarding Ozempic order submitted 10/15/19. Provided information updated, refaxed. Will f/u next week  Prepared sample for patient. Counseled on mechanism of action, side effects. When Ozempic is started, decrease Tresiba to 8 units daily to reduce risk of hypoglycemia. Goal of insulin minimization/elimination   Patient due for refill for lisinopril and atorvastatin. Refills sent.   Provided empathetic listening as she discussed recent stressors.   Patient Self Care Activities:   Patient will check blood glucose BID, document, and provide at future appointments  Patient will take medications as prescribed  Patient will contact provider with any episodes of hypoglycemia  Patient will report any questions or concerns to provider   Patient will collaborate on completion of patient assistance application  Please see past updates related to this goal by clicking on the "Past Updates" button in the selected goal          Plan:  - PCP f/u in ~ 4 weeks. Scheduled f/u with me ~6 weeks after that  [name], PharmD, [place],  CPP Clinical Pharmacist [place] [phone]  Medication Samples have been provided to the patient.  Drug name: Ozempic       Strength: 0.25/0.5 mg        Qty: 1  LOT: [id number]  Exp.Date: 11/2021

## 2019-11-27 ENCOUNTER — Other Ambulatory Visit: Payer: Self-pay | Admitting: Pharmacy Technician

## 2019-11-27 NOTE — Patient Outreach (Signed)
[facility]) Care Management  11/27/2019  IVIANA BLASINGAME 09-May-1945 [id number]   Received in basket message from embedded [place] inquiring if I could [employer] on the status of Ozepmic delivery.  Spoke to [employer] who was able to process the request.  She informed it should arrive to the provider's office in the next 10-14 business days.  Will route note to [place] as Juluis Rainier.  Kayia Billinger P. Disney [place], [place]  [phone]

## 2019-12-09 ENCOUNTER — Other Ambulatory Visit: Payer: Self-pay

## 2019-12-09 MED ORDER — CARVEDILOL 3.125 MG PO TABS: 3.1250 mg | ORAL_TABLET | Freq: Two times a day (BID) | ORAL | 1 refills | Status: DC

## 2019-12-09 NOTE — Telephone Encounter (Signed)
*  STAT* If patient is at the pharmacy, call can be transferred to refill team.   1. Which medications need to be refilled? (please list name of each medication and dose if known) Carvedilol  2. Which pharmacy/location (including street and city if local pharmacy) is medication to be sent to? [place]  3. Do they need a 30 day or 90 day supply? [place]

## 2019-12-16 ENCOUNTER — Other Ambulatory Visit: Payer: Self-pay | Admitting: Internal Medicine

## 2019-12-16 ENCOUNTER — Other Ambulatory Visit: Payer: Self-pay

## 2019-12-16 ENCOUNTER — Ambulatory Visit (INDEPENDENT_AMBULATORY_CARE_PROVIDER_SITE_OTHER): Payer: Medicare Other | Admitting: Internal Medicine

## 2019-12-16 VITALS — BP 128/70 | HR 67 | Temp 98.3°F | Resp 16 | Ht 62.0 in | Wt 143.4 lb

## 2019-12-16 DIAGNOSIS — I2510 Atherosclerotic heart disease of native coronary artery without angina pectoris: Secondary | ICD-10-CM | POA: Diagnosis not present

## 2019-12-16 DIAGNOSIS — I700 Atherosclerosis of aorta: Secondary | ICD-10-CM

## 2019-12-16 DIAGNOSIS — E1165 Type 2 diabetes mellitus with hyperglycemia: Secondary | ICD-10-CM | POA: Diagnosis not present

## 2019-12-16 DIAGNOSIS — D696 Thrombocytopenia, unspecified: Secondary | ICD-10-CM

## 2019-12-16 DIAGNOSIS — E039 Hypothyroidism, unspecified: Secondary | ICD-10-CM | POA: Diagnosis not present

## 2019-12-16 DIAGNOSIS — E875 Hyperkalemia: Secondary | ICD-10-CM

## 2019-12-16 DIAGNOSIS — E7800 Pure hypercholesterolemia, unspecified: Secondary | ICD-10-CM | POA: Diagnosis not present

## 2019-12-16 DIAGNOSIS — K2100 Gastro-esophageal reflux disease with esophagitis, without bleeding: Secondary | ICD-10-CM

## 2019-12-16 DIAGNOSIS — Z1231 Encounter for screening mammogram for malignant neoplasm of breast: Secondary | ICD-10-CM | POA: Diagnosis not present

## 2019-12-16 DIAGNOSIS — I10 Essential (primary) hypertension: Secondary | ICD-10-CM | POA: Diagnosis not present

## 2019-12-16 LAB — BASIC METABOLIC PANEL
BUN: 19 mg/dL (ref 6–23)
CO2: 26 mEq/L (ref 19–32)
Calcium: 9.7 mg/dL (ref 8.4–10.5)
Chloride: 108 mEq/L (ref 96–112)
Creatinine, Ser: 1.16 mg/dL (ref 0.40–1.20)
GFR: 46.58 mL/min — ABNORMAL LOW (ref >60.00)
Glucose, Bld: 147 mg/dL — ABNORMAL HIGH (ref 70–99)
Potassium: 5.5 mEq/L — ABNORMAL HIGH (ref 3.5–5.1)
Sodium: 141 mEq/L (ref 135–145)

## 2019-12-16 LAB — CBC WITH DIFFERENTIAL/PLATELET
Basophils Absolute: 0.1 10*3/uL (ref 0.0–0.1)
Basophils Relative: 1.4 % (ref 0.0–3.0)
Eosinophils Absolute: 0.2 10*3/uL (ref 0.0–0.7)
Eosinophils Relative: 3 % (ref 0.0–5.0)
HCT: 44.8 % (ref 36.0–46.0)
Hemoglobin: 14.6 g/dL (ref 12.0–15.0)
Lymphocytes Relative: 37.6 % (ref 12.0–46.0)
Lymphs Abs: 3 10*3/uL (ref 0.7–4.0)
MCHC: 32.7 g/dL (ref 30.0–36.0)
MCV: 89.6 fl (ref 78.0–100.0)
Monocytes Absolute: 0.4 10*3/uL (ref 0.1–1.0)
Monocytes Relative: 5 % (ref 3.0–12.0)
Neutro Abs: 4.1 10*3/uL (ref 1.4–7.7)
Neutrophils Relative %: 53 % (ref 43.0–77.0)
Platelets: 125 10*3/uL — ABNORMAL LOW (ref 150.0–400.0)
RBC: 5 Mil/uL (ref 3.87–5.11)
RDW: 15.3 % (ref 11.5–15.5)
WBC: 7.8 10*3/uL (ref 4.0–10.5)

## 2019-12-16 LAB — HEPATIC FUNCTION PANEL
ALT: 16 U/L (ref 0–35)
AST: 14 U/L (ref 0–37)
Albumin: 4.2 g/dL (ref 3.5–5.2)
Alkaline Phosphatase: 95 U/L (ref 39–117)
Bilirubin, Direct: 0.1 mg/dL (ref 0.0–0.3)
Total Bilirubin: 0.5 mg/dL (ref 0.2–1.2)
Total Protein: 6.6 g/dL (ref 6.0–8.3)

## 2019-12-16 LAB — LIPID PANEL
Cholesterol: 129 mg/dL (ref 0–200)
HDL: 41.5 mg/dL
LDL Cholesterol: 63 mg/dL (ref 0–99)
NonHDL: 87.18
Total CHOL/HDL Ratio: 3
Triglycerides: 120 mg/dL (ref 0.0–149.0)
VLDL: 24 mg/dL (ref 0.0–40.0)

## 2019-12-16 LAB — HEMOGLOBIN A1C: Hgb A1c MFr Bld: 7.9 % — ABNORMAL HIGH (ref 4.6–6.5)

## 2019-12-16 MED ORDER — PANTOPRAZOLE SODIUM 40 MG PO TBEC: 40.0000 mg | DELAYED_RELEASE_TABLET | Freq: Every day | ORAL | 1 refills | Status: DC | PRN

## 2019-12-16 NOTE — Progress Notes (Signed)
Order placed for f/u potassium check at [place].

## 2019-12-16 NOTE — Progress Notes (Signed)
Patient ID: Ann Collins, female   DOB: 09-03-1945, 74 y.o.   MRN: [id number]   Subjective:    Patient ID: Ann Collins, female    DOB: Oct 25, 1945, 74 y.o.   MRN: [id number]  HPI This visit occurred during the SARS-CoV-2 public health emergency.  Safety protocols were in place, including screening questions prior to the visit, additional usage of staff PPE, and extensive cleaning of exam room while observing appropriate contact time as indicated for disinfecting solutions.  Patient here for a scheduled follow up.  Here to follow up regarding her blood sugars, blood pressure and cholesterol.  She reports increased stress - family stress.  Discussed with her today.  She feels she is handling things relatively well.  Does not feel needs any further intervention.  No chest pain or sob.  No acid reflux.  No abdominal pain.  Bowels move qod.  On ozempic.  Tolerating.  States not as big of an appetite now, but eating well.  No nausea or vomiting.  No recorded sugars brought in.  Reports sometimes in afternoon, sugar around 119.  No sugars over 200.  Overdue mammogram.  Does not want to schedule at this time.     Past Medical History:  Diagnosis Date  . Abdominal hernia   . CAD (coronary artery disease)    a. LHC 7/19: LM nl, p-mLAD 100% w/ right to left collaterals, OM2 90% (2 mm), 1st LPL 40%, medical management  . Diabetes mellitus ([place])   . Diverticulosis   . GERD (gastroesophageal reflux disease)   . Hematuria   . Hypercholesterolemia   . Hypertension   . Hypothyroidism   . Kidney stones    Past Surgical History:  Procedure Laterality Date  . APPENDECTOMY    . BREAST BIOPSY  1969   right  . CHOLECYSTECTOMY  2005  . COLONOSCOPY    . COLONOSCOPY WITH PROPOFOL N/A 09/19/2017   Procedure: COLONOSCOPY WITH PROPOFOL;  Surgeon: Toledo, Benay Pike, MD;  Location: [facility];  Service: Gastroenterology;  Laterality: N/A;  . ESOPHAGOGASTRODUODENOSCOPY (EGD) WITH PROPOFOL N/A 09/19/2017    Procedure: ESOPHAGOGASTRODUODENOSCOPY (EGD) WITH PROPOFOL;  Surgeon: Toledo, Benay Pike, MD;  Location: [facility];  Service: Gastroenterology;  Laterality: N/A;  . LEFT HEART CATH AND CORONARY ANGIOGRAPHY Left 08/07/2017   Procedure: LEFT HEART CATH AND CORONARY ANGIOGRAPHY;  Surgeon: Wellington Hampshire, MD;  Location: [facility];  Service: Cardiovascular;  Laterality: Left;  . UPPER GI ENDOSCOPY     Family History  Problem Relation Age of Onset  . Brain cancer Mother        history of brain tumor  . Lung disease Father   . Diabetes Brother        x2  . Bipolar disorder Son   . Alcohol abuse Son   . Breast cancer Neg Hx   . Colon cancer Neg Hx    Social History   Socioeconomic History  . Marital status: Married    Spouse name: Not on file  . Number of children: Not on file  . Years of education: Not on file  . Highest education level: Not on file  Occupational History  . Not on file  Tobacco Use  . Smoking status: Current Every Day Smoker    Packs/day: 1.00    Years: 50.00    Pack years: 50.00  . Smokeless tobacco: Never Used  Vaping Use  . Vaping Use: Never used  Substance and Sexual Activity  . Alcohol  use: No    Alcohol/week: 0.0 standard drinks  . Drug use: No  . Sexual activity: Not on file  Other Topics Concern  . Not on file  Social History Narrative  . Not on file   Social Determinants of Health   Financial Resource Strain: Medium Risk  . Difficulty of Paying Living Expenses: Somewhat hard  Food Insecurity:   . Worried About Running Out of Food in the Last Year: Not on file  . Ran Out of Food in the Last Year: Not on file  Transportation Needs:   . Lack of Transportation (Medical): Not on file  . Lack of Transportation (Non-Medical): Not on file  Physical Activity:   . Days of Exercise per Week: Not on file  . Minutes of Exercise per Session: Not on file  Stress: Stress Concern Present  . Feeling of Stress : To some extent  Social  Connections:   . Frequency of Communication with Friends and Family: Not on file  . Frequency of Social Gatherings with Friends and Family: Not on file  . Attends Religious Services: Not on file  . Active Member of Clubs or Organizations: Not on file  . Attends Club or Organization Meetings: Not on file  . Marital Status: Not on file    Outpatient Encounter Medications as of 12/16/2019  Medication Sig  . aspirin 81 MG tablet Take 81 mg by mouth daily.  . atorvastatin (LIPITOR) 40 MG tablet Take 1 tablet (40 mg total) by mouth daily.  . carvedilol (COREG) 3.125 MG tablet Take 1 tablet (3.125 mg total) by mouth 2 (two) times daily.  . cyanocobalamin 1000 MCG tablet Take 1,000 mcg by mouth as needed. Takes occasionally  . empagliflozin (JARDIANCE) 25 MG TABS tablet Take 25 mg by mouth daily before breakfast.  . Glucosamine 500 MG CAPS Take 1 capsule by mouth as needed.  . glucose blood test strip Use as instructed to blood sugar twice daily  . insulin degludec (TRESIBA) 100 UNIT/ML FlexTouch Pen Inject 0.06 mLs (6 Units total) into the skin 2 (two) times daily.  . Insulin Pen Needle 32G X 6 MM MISC To fit TRESIBA PEN  . levothyroxine (SYNTHROID) 100 MCG tablet TAKE 1 TABLET(100 MCG) BY MOUTH DAILY  . lisinopril (ZESTRIL) 20 MG tablet Take 1 tablet (20 mg total) by mouth daily.  . Semaglutide,0.25 or 0.5MG/DOS, (OZEMPIC, 0.25 OR 0.5 MG/DOSE,) 2 MG/1.5ML SOPN Inject 0.25 mg once weekly x 4 weeks then increase to 0.5 mg weekly (Patient not taking: Reported on 11/19/2019)  . [DISCONTINUED] pantoprazole (PROTONIX) 40 MG tablet Take 40 mg by mouth as needed.   . [DISCONTINUED] pantoprazole (PROTONIX) 40 MG tablet Take 1 tablet (40 mg total) by mouth daily as needed.   No facility-administered encounter medications on file as of 12/16/2019.    Review of Systems  Constitutional: Negative for appetite change and unexpected weight change.  HENT: Negative for congestion and sinus pressure.     Respiratory: Negative for cough, chest tightness and shortness of breath.   Cardiovascular: Negative for chest pain, palpitations and leg swelling.  Gastrointestinal: Negative for abdominal pain, diarrhea, nausea and vomiting.  Genitourinary: Negative for difficulty urinating and dysuria.  Skin: Negative for color change and rash.  Neurological: Negative for dizziness, light-headedness and headaches.  Psychiatric/Behavioral: Negative for agitation and dysphoric mood.       Objective:    Physical Exam Vitals reviewed.  Constitutional:      General: She is not in acute distress.      Appearance: Normal appearance.  HENT:     Head: Normocephalic and atraumatic.     Right Ear: External ear normal.     Left Ear: External ear normal.  Eyes:     General: No scleral icterus.       Right eye: No discharge.        Left eye: No discharge.     Conjunctiva/sclera: Conjunctivae normal.  Neck:     Thyroid: No thyromegaly.  Cardiovascular:     Rate and Rhythm: Normal rate and regular rhythm.  Pulmonary:     Effort: No respiratory distress.     Breath sounds: Normal breath sounds. No wheezing.  Abdominal:     General: Bowel sounds are normal.     Palpations: Abdomen is soft.     Tenderness: There is no abdominal tenderness.  Musculoskeletal:        General: No swelling or tenderness.     Cervical back: Neck supple. No tenderness.  Lymphadenopathy:     Cervical: No cervical adenopathy.  Skin:    Findings: No erythema or rash.  Neurological:     Mental Status: She is alert.  Psychiatric:        Mood and Affect: Mood normal.        Behavior: Behavior normal.     BP 128/70   Pulse 67   Temp 98.3 F (36.8 C) (Oral)   Resp 16   Ht 5' 2" (1.575 m)   Wt 143 lb 6.4 oz (65 kg)   LMP 03/10/1991   SpO2 99%   BMI 26.23 kg/m  Wt Readings from Last 3 Encounters:  12/16/19 143 lb 6.4 oz (65 kg)  08/26/19 145 lb (65.8 kg)  08/13/19 144 lb (65.3 kg)     Lab Results  Component  Value Date   WBC 7.8 12/16/2019   HGB 14.6 12/16/2019   HCT 44.8 12/16/2019   PLT 125.0 (L) 12/16/2019   GLUCOSE 147 (H) 12/16/2019   CHOL 129 12/16/2019   TRIG 120.0 12/16/2019   HDL 41.50 12/16/2019   LDLDIRECT 71 10/18/2017   LDLCALC 63 12/16/2019   ALT 16 12/16/2019   AST 14 12/16/2019   NA 141 12/16/2019   K 5.5 (H) 12/16/2019   CL 108 12/16/2019   CREATININE 1.16 12/16/2019   BUN 19 12/16/2019   CO2 26 12/16/2019   TSH 3.75 08/13/2019   INR 0.87 08/07/2017   HGBA1C 7.9 (H) 12/16/2019   MICROALBUR <0.7 08/13/2019    CT CHEST LUNG CANCER SCREENING LOW DOSE WO CONTRAST  Result Date: 08/26/2019 CLINICAL DATA:  73-year-old female with 53 pack-year history of smoking. Lung cancer screening. EXAM: CT CHEST WITHOUT CONTRAST LOW-DOSE FOR LUNG CANCER SCREENING TECHNIQUE: Multidetector CT imaging of the chest was performed following the standard protocol without IV contrast. COMPARISON:  08/13/2018 FINDINGS: Cardiovascular: The heart size is normal. No substantial pericardial effusion. Coronary artery calcification is evident. Atherosclerotic calcification is noted in the wall of the thoracic aorta. Mediastinum/Nodes: No mediastinal lymphadenopathy. No evidence for gross hilar lymphadenopathy although assessment is limited by the lack of intravenous contrast on today's study. The esophagus has normal imaging features. There is no axillary lymphadenopathy. Lungs/Pleura: Centrilobular emphsyema noted. Scattered tiny pulmonary nodules identified previously are stable. No new suspicious pulmonary nodule or mass. No focal airspace consolidation. No pleural effusion. Upper Abdomen: Stable small cyst upper pole left kidney. Musculoskeletal: No worrisome lytic or sclerotic osseous abnormality. IMPRESSION: 1. Lung-RADS 2, benign appearance or behavior. Continue annual screening with low-dose   chest CT without contrast in 12 months. 2. Aortic Atherosclerosis (ICD10-I70.0) and Emphysema (ICD10-J43.9).  Electronically Signed   By: [name]  Mansell M.D.   On: 08/26/2019 11:13       Assessment & Plan:   Problem List Items Addressed This Visit    Thrombocytopenia (HCC)    Follow cbc.       Hypothyroidism    On thyroid replacement.  Follow tsh.       Hypertension    On lisinopril.  Blood pressure as outlined.  Follow pressures.  Follow metabolic panel.       Relevant Orders   CBC with Differential/Platelet (Completed)   Basic metabolic panel (Completed)   Hypercholesterolemia    On lipitor.  Low cholesterol diet and exercise.  Follow lipid panel and liver function tests.        Relevant Orders   Hepatic function panel (Completed)   Lipid panel (Completed)   GERD (gastroesophageal reflux disease)    No upper symptoms reported.  On protonix.        Diabetes mellitus (HCC) - Primary    No recorded sugars brought in.  Low carb diet and exercise.  Follow met b anda 1c.  On ozempic and jardiance.        Relevant Orders   Hemoglobin A1c (Completed)   Breast cancer screening    Declines mammogram.       Aortic atherosclerosis (HCC)    Continue lipitor.           Charlene Scott, MD 

## 2019-12-16 NOTE — Assessment & Plan Note (Signed)
Continue lipitor  ?

## 2019-12-17 ENCOUNTER — Other Ambulatory Visit: Payer: Self-pay | Admitting: Internal Medicine

## 2019-12-21 ENCOUNTER — Encounter: Payer: Self-pay | Admitting: Internal Medicine

## 2019-12-21 NOTE — Assessment & Plan Note (Signed)
Follow cbc.  

## 2019-12-21 NOTE — Assessment & Plan Note (Signed)
Declines mammogram

## 2019-12-21 NOTE — Assessment & Plan Note (Signed)
On thyroid replacement.  Follow tsh.  

## 2019-12-21 NOTE — Assessment & Plan Note (Signed)
On lipitor.  Low cholesterol diet and exercise.  Follow lipid panel and liver function tests.   

## 2019-12-21 NOTE — Assessment & Plan Note (Addendum)
No recorded sugars brought in.  Low carb diet and exercise.  Follow met b anda 1c.  On ozempic and jardiance.

## 2019-12-21 NOTE — Assessment & Plan Note (Signed)
On lisinopril.  Blood pressure as outlined.  Follow pressures.  Follow metabolic panel.  

## 2019-12-21 NOTE — Assessment & Plan Note (Signed)
No upper symptoms reported.  On protonix.   

## 2020-01-14 ENCOUNTER — Telehealth: Payer: Self-pay | Admitting: Internal Medicine

## 2020-01-14 NOTE — Telephone Encounter (Signed)
Just FYI I have placed in yellow folder (labs & results) for you to look over.

## 2020-01-14 NOTE — Telephone Encounter (Signed)
Patient dropped off sugar readings. Readings are up front in color folder. 

## 2020-01-14 NOTE — Telephone Encounter (Signed)
Review when back in office.  See note.

## 2020-01-30 DIAGNOSIS — Z23 Encounter for immunization: Secondary | ICD-10-CM | POA: Diagnosis not present

## 2020-02-03 ENCOUNTER — Ambulatory Visit: Payer: Medicare Other | Admitting: Pharmacist

## 2020-02-03 DIAGNOSIS — I10 Essential (primary) hypertension: Secondary | ICD-10-CM

## 2020-02-03 DIAGNOSIS — I700 Atherosclerosis of aorta: Secondary | ICD-10-CM

## 2020-02-03 DIAGNOSIS — E1165 Type 2 diabetes mellitus with hyperglycemia: Secondary | ICD-10-CM

## 2020-02-03 DIAGNOSIS — E7800 Pure hypercholesterolemia, unspecified: Secondary | ICD-10-CM

## 2020-02-03 NOTE — Patient Instructions (Addendum)
[name],   It was great talking to you today!  For now, continue Ozempic 0.5 mg weekly, Tresiba 4 units daily, and Jardiance 25 mg daily. We will apply for patient assistance for these medications for 2022.   Please check your blood sugars at least twice daily at home, fasting and 2 hours after a meal. This will help [place] better evaluate your current control on these medications.   Here is how I would fill your pill box:   - Levothyroxine - take separately, 30 minutes before eating or other medications every morning  Morning: - Jardiance 25 mg daily (diabetes) - carvedilol 3.125 mg (blood pressure) - lisinopril 20 mg (blood pressure) - aspirin 81 mg   Evening: - carvedilol 3.125 mg (blood pressure) - atorvastatin 40 mg (cholesterol)  You can add your vitamins/supplements/as needed medications whenever you need.   Call me with any questions!  [name] Feliz Beam, PharmD [phone]  Visit Information  Patient Care Plan: Medication Management    Problem Identified: Diabetes, HLD, CKD     Long-Range Goal: Disease Progression Prevention   This Visit's Progress: On track  Priority: High  Note:   Current Barriers:  . Unable to independently afford treatment regimen . Unable to independently monitor therapeutic efficacy . Unable to maintain control of diabetes  Pharmacist Clinical Goal(s):  Marland Kitchen Over the next 90 days, patient will verbalize ability to afford treatment regimen . Over the next 90 days, patient will achieve control of diabetes as evidenced by A1c through collaboration with PharmD and provider.   Interventions: . 1:1 collaboration with Dale [place], MD regarding development and update of comprehensive plan of care as evidenced by provider attestation and co-signature . Inter-disciplinary care team collaboration (see longitudinal plan of care) . Comprehensive medication review performed; medication list updated in electronic medical record  Medication  Management: . Denies use of pill box or other organization methods. Fill history not always up to date. We discussed benefit of BID pill box for organization. Patient amenable. Will provide one for her.   Diabetes: . Uncontrolled; current treatment: Jardiance 25 mg daily, Ozempic 0.5 mg weekly x 2 weeks, Tresiba 4 units daily (self reduced when she increased Ozempic); notes reduction in appetite w/ Ozempic dose increase and some nausea, but nausea has resolved  . Current glucose readings: fasting today while on the phone (after dentist appt): 168, has not been checking other readings recently d/t familial stressors . Reports 1 hypoglycemic episode - reading of 69 w/ "feeling funny" symptoms.  . Discussed possibility of CGM. Patient has Medicare A/B w/ a Part D plan, so likely will have a copay for [place] sensors. Discussed sample, though unsure if patient's [employer] is capable of [place] 2 app. Will continue to follow for appropriateness of incorporating CGM technology . Discussed importance of glucose checks to evaluate safety and efficacy of regimen. Check fasting, 2 hour post prandial.  . Continue current regimen at this time. Due to re-apply for Nov[employer] assistance for 2022. Will have patient come by office to sign paperwork; she will bring proof of income.   Hypertension: . Controlled; current treatment: losartan 25 mg daily, carvedilol 3.125 mg BID  . Current home readings: notes that most readings are "good", but has been higher when she has been checking over the holiday season. Denies sitting for 5 minutes before checking . Discussed appropriate BP checking technique. Continue current regimen at this time.   Hyperlipidemia and ASCVD risk reduction: . Controlled; current treatment: atorvastatin 40 mg  daily . Current antiplatelet regimen: aspirin 81 mg daily, though sometimes forgets  . Continue current regimen at this time  Hypothyroidism: . Controlled per last TSH; current regimen:  levothyroxine 100 mcg daily . Continue current regimen at this time  Tobacco Abuse: . 1 packs per day; denies being in a place to be able to work on tobacco cessation d/t stressors in her life.  . Will continue to evaluate readiness to quit and remind of health benefits  Acid Reflux: . Controlled per patient report; current regimen: pantoprazole 40 mg PRN . Continue current regimen at this time  Supplements: . Reports current use of glucosamine PRN wrist pain w/ benefit; vitamin B12 1000 units daily . Continue current regimen at this time  Patient Goals/Self-Care Activities . Over the next 90 days, patient will:  - take medications as prescribed check glucose TID, document, and provide at future appointments collaborate with provider on medication access solutions  Follow Up Plan: Telephone follow up appointment with care management team member scheduled for: ~ 4 WEEKS       Print copy of patient instructions, educational materials, and care plan provided in person.   Plan: Telephone follow up appointment with care management team member scheduled for: ~ 4 weeks  [name] Feliz Beam, PharmD, [place], CPP Clinical Pharmacist [employer] [facility] [employer] [phone]

## 2020-02-03 NOTE — Chronic Care Management (AMB) (Signed)
Chronic Care Management   Pharmacy Note  02/03/2020 Name: Ann Collins MRN: [id number] DOB: April 27, 1945  Subjective:  Ann Collins is a 74 y.o. year old female who is a primary care patient of Dale Crook, MD. The CCM team was consulted for assistance with chronic disease management and care coordination needs.    Engaged with patient by telephone for follow up visit in response to provider referral for pharmacy case management and/or care coordination services.   Consent to Services:  Ann Collins was given information about Chronic Care Management services, agreed to services, and gave verbal consent prior to initiation of services on 09/20/2018. Please see initial visit note for detailed documentation.   Objective:  Lab Results  Component Value Date   CREATININE 1.16 12/16/2019   CREATININE 1.04 08/13/2019   CREATININE 1.09 04/23/2019    Lab Results  Component Value Date   HGBA1C 7.9 (H) 12/16/2019       Component Value Date/Time   CHOL 129 12/16/2019 0943   CHOL 134 10/18/2017 1447   TRIG 120.0 12/16/2019 0943   HDL 41.50 12/16/2019 0943   HDL 41 10/18/2017 1447   CHOLHDL 3 12/16/2019 0943   VLDL 24.0 12/16/2019 0943   LDLCALC 63 12/16/2019 0943   LDLCALC 63 10/18/2017 1447   LDLDIRECT 71 10/18/2017 1447   LDLDIRECT 156.0 08/29/2016 1523    Lab Results  Component Value Date   TSH 3.75 08/13/2019     Clinical ASCVD: No - but aortic atherosclerosis on imaging The ASCVD Risk score Denman George DC Jr., et al., 2013) failed to calculate for the following reasons:   The valid total cholesterol range is 130 to 320 mg/dL    BP Readings from Last 3 Encounters:  12/16/19 128/70  08/13/19 122/70  07/09/19 120/60    Assessment/Interventions: Review of patient past medical history, allergies, medications, health status, including review of consultants reports, laboratory and other test data, was performed as part of comprehensive evaluation and provision of  chronic care management services.   SDOH (Social Determinants of Health) assessments and interventions performed:  SDOH Interventions   Flowsheet Row Most Recent Value  SDOH Interventions   SDOH Interventions for the Following Domains Tobacco  Financial Strain Interventions Other (Comment)  [manufacturer assistance]  Stress Interventions Other (Comment)  [provided empathetic listening]  Tobacco Interventions Cessation Materials Given and Reviewed       CCM Care Plan  Allergies  Allergen Reactions  . Pioglitazone Swelling  . Macrobid [Nitrofurantoin Macrocrystal] Other (See Comments)    Body aches  . Nitrofurantoin Other (See Comments)    Body aches    Medications Reviewed Today    Reviewed by Lourena Simmonds, RPH-CPP (Pharmacist) on 02/03/20 at 1139  Med List Status: <None>  Medication Order Taking? Sig Documenting Provider Last Dose Status Informant  aspirin 81 MG tablet [id number] Yes Take 81 mg by mouth daily. [provider] Taking Active   atorvastatin (LIPITOR) 40 MG tablet [id number] Yes Take 1 tablet (40 mg total) by mouth daily. Dale Gonzalez, MD Taking Active   carvedilol (COREG) 3.125 MG tablet [id number] Yes Take 1 tablet (3.125 mg total) by mouth 2 (two) times daily. Iran Ouch, MD Taking Active   cyanocobalamin 1000 MCG tablet [id number] Yes Take 1,000 mcg by mouth as needed. Takes occasionally [provider] Taking Active   empagliflozin (JARDIANCE) 25 MG TABS tablet [id number] Yes Take 25 mg by mouth daily before breakfast. Dale [place], MD Taking Active   Glucosamine 500  MG CAPS [id number]  Take 1 capsule by mouth as needed. [provider]  Active            Med Note Darnelle Maffucci, Maralyn Sago Sep 20, 2018 11:05 AM) Taking for wrist pain  glucose blood test strip [id number] Yes Use as instructed to blood sugar twice daily Einar Pheasant, MD Taking Active   insulin degludec (TRESIBA) 100 UNIT/ML FlexTouch Pen [id number] Yes  Inject 0.06 mLs (6 Units total) into the skin 2 (two) times daily. Einar Pheasant, MD Taking Active            Med Note Mayo Ao Feb 03, 2020 11:23 AM) Taking 4 units QAM  Insulin Pen Needle 32G X 6 MM MISC [id number] Yes To fit TRESIBA PEN Crecencio Mc, MD Taking Active   levothyroxine (SYNTHROID) 100 MCG tablet [id number] Yes TAKE 1 TABLET(100 MCG) BY MOUTH DAILY Einar Pheasant, MD Taking Active   lisinopril (ZESTRIL) 20 MG tablet [id number] Yes Take 1 tablet (20 mg total) by mouth daily. Einar Pheasant, MD Taking Active   pantoprazole (PROTONIX) 40 MG tablet [id number] Yes TAKE 1 TABLET BY MOUTH DAILY AS NEEDED Einar Pheasant, MD Taking Active   Semaglutide,0.25 or 0.5MG /DOS, (OZEMPIC, 0.25 OR 0.5 MG/DOSE,) 2 MG/1.5ML SOPN [id number] Yes Inject 0.25 mg once weekly x 4 weeks then increase to 0.5 mg weekly Einar Pheasant, MD Taking Active            Med Note De Hollingshead   Mon Feb 03, 2020 11:22 AM) 0.5 mg          Patient Active Problem List   Diagnosis Date Noted  . Adrenal adenoma 08/19/2019  . Breast cancer screening 08/19/2019  . Thrombocytopenia ([place]) 01/27/2019  . Left wrist fracture 04/27/2018  . Cough 04/27/2018  . Abnormal EKG   . Chest pain 07/10/2017  . Renal lesion 04/10/2016  . Aortic atherosclerosis ([place]) 06/30/2015  . Personal history of tobacco use, presenting hazards to health 06/25/2015  . Left carotid bruit 12/28/2014  . Health care maintenance 07/29/2014  . Diabetes mellitus ([place]) 03/10/2012  . Hypothyroidism 03/10/2012  . Hypercholesterolemia 03/10/2012  . Hypertension 03/10/2012  . GERD (gastroesophageal reflux disease) 03/10/2012    Conditions to be addressed/monitored per PCP order: HTN, HLD and DMII  Patient Care Plan: Medication Management    Problem Identified: Diabetes, HLD, CKD     Long-Range Goal: Disease Progression Prevention   This Visit's Progress: On track  Priority: High  Note:   Current Barriers:   . Unable to independently afford treatment regimen . Unable to independently monitor therapeutic efficacy . Unable to maintain control of diabetes  Pharmacist Clinical Goal(s):  Marland Kitchen Over the next 90 days, patient will verbalize ability to afford treatment regimen . Over the next 90 days, patient will achieve control of diabetes as evidenced by A1c through collaboration with PharmD and provider.   Interventions: . 1:1 collaboration with Einar Pheasant, MD regarding development and update of comprehensive plan of care as evidenced by provider attestation and co-signature . Inter-disciplinary care team collaboration (see longitudinal plan of care) . Comprehensive medication review performed; medication list updated in electronic medical record  Medication Management: . Denies use of pill box or other organization methods. Fill history not always up to date. We discussed benefit of BID pill box for organization. Patient amenable. Will provide one for her.   Diabetes: . Uncontrolled; current treatment: Jardiance 25 mg daily, Ozempic 0.5 mg weekly x  2 weeks, Tresiba 4 units daily (self reduced when she increased Ozempic); notes reduction in appetite w/ Ozempic dose increase and some nausea, but nausea has resolved  . Current glucose readings: fasting today while on the phone (after dentist appt): 168, has not been checking other readings recently d/t familial stressors . Reports 1 hypoglycemic episode - reading of 69 w/ "feeling funny" symptoms.  . Discussed possibility of CGM. Patient has Medicare A/B w/ a Part D plan, so likely will have a copay for [place] sensors. Discussed sample, though unsure if patient's [employer] is capable of [place] 2 app. Will continue to follow for appropriateness of incorporating CGM technology . Discussed importance of glucose checks to evaluate safety and efficacy of regimen. Check fasting, 2 hour post prandial.  . Continue current regimen at this time. Due to  re-apply for Novo assistance for 2022. Will have patient come by office to sign paperwork; she will bring proof of income.   Hypertension: . Controlled; current treatment: losartan 25 mg daily, carvedilol 3.125 mg BID  . Current home readings: notes that most readings are "good", but has been higher when she has been checking over the holiday season. Denies sitting for 5 minutes before checking . Discussed appropriate BP checking technique. Continue current regimen at this time.   Hyperlipidemia and ASCVD risk reduction: . Controlled; current treatment: atorvastatin 40 mg daily . Current antiplatelet regimen: aspirin 81 mg daily, though sometimes forgets  . Continue current regimen at this time  Hypothyroidism: . Controlled per last TSH; current regimen: levothyroxine 100 mcg daily . Continue current regimen at this time  Tobacco Abuse: . 1 packs per day; denies being in a place to be able to work on tobacco cessation d/t stressors in her life.  . Will continue to evaluate readiness to quit and remind of health benefits  Acid Reflux: . Controlled per patient report; current regimen: pantoprazole 40 mg PRN . Continue current regimen at this time  Supplements: . Reports current use of glucosamine PRN wrist pain w/ benefit; vitamin B12 1000 units daily . Continue current regimen at this time  Patient Goals/Self-Care Activities . Over the next 90 days, patient will:  - take medications as prescribed check glucose TID, document, and provide at future appointments collaborate with provider on medication access solutions  Follow Up Plan: Telephone follow up appointment with care management team member scheduled for: ~ 4 WEEKS      Medication Assistance: Application for Novo Evaristo [place], [place]) BI ([place]) medication assistance program in process. Anticipated assistance start date TBD. See plan of care above for additional detail.   Plan: Telephone follow up appointment with care  management team member scheduled for: ~ 4 weeks  [name] Feliz Beam, PharmD, [place], CPP Clinical Pharmacist [employer] at [employer] [phone]

## 2020-02-13 ENCOUNTER — Telehealth: Payer: Self-pay

## 2020-02-13 NOTE — Telephone Encounter (Signed)
Pt dropped off patient assistance forms-placed in your folder up front

## 2020-02-14 ENCOUNTER — Ambulatory Visit: Payer: Medicare Other | Admitting: Pharmacist

## 2020-02-14 DIAGNOSIS — I10 Essential (primary) hypertension: Secondary | ICD-10-CM

## 2020-02-14 DIAGNOSIS — E7800 Pure hypercholesterolemia, unspecified: Secondary | ICD-10-CM

## 2020-02-14 DIAGNOSIS — E1165 Type 2 diabetes mellitus with hyperglycemia: Secondary | ICD-10-CM

## 2020-02-14 DIAGNOSIS — I700 Atherosclerosis of aorta: Secondary | ICD-10-CM

## 2020-02-14 NOTE — Chronic Care Management (AMB) (Signed)
Chronic Care Management   Pharmacy Note  02/14/2020 Name: Ann Collins MRN: [id number] DOB: 07-22-1945  Subjective:  Ann Collins is a 75 y.o. year old female who is a primary care patient of Einar Pheasant, MD. The CCM team was consulted for assistance with chronic disease management and care coordination needs.    Care Coordination for medication access in response to provider referral for pharmacy case management and/or care coordination services.   Consent to Services:  Patient was given information about Chronic Care Management services, agreed to services, and gave verbal consent prior to initiation of services on 09/17/2018. Please see initial visit note for detailed documentation.   Objective:  Lab Results  Component Value Date   CREATININE 1.16 12/16/2019   CREATININE 1.04 08/13/2019   CREATININE 1.09 04/23/2019    Lab Results  Component Value Date   HGBA1C 7.9 (H) 12/16/2019       Component Value Date/Time   CHOL 129 12/16/2019 0943   CHOL 134 10/18/2017 1447   TRIG 120.0 12/16/2019 0943   HDL 41.50 12/16/2019 0943   HDL 41 10/18/2017 1447   CHOLHDL 3 12/16/2019 0943   VLDL 24.0 12/16/2019 0943   LDLCALC 63 12/16/2019 0943   LDLCALC 63 10/18/2017 1447   LDLDIRECT 71 10/18/2017 1447   LDLDIRECT 156.0 08/29/2016 1523     BP Readings from Last 3 Encounters:  12/16/19 128/70  08/13/19 122/70  07/09/19 120/60    Assessment/Interventions: Review of patient past medical history, allergies, medications, health status, including review of consultants reports, laboratory and other test data, was performed as part of comprehensive evaluation and provision of chronic care management services.   SDOH (Social Determinants of Health) assessments and interventions performed:  SDOH Interventions   Flowsheet Row Most Recent Value  SDOH Interventions   Financial Strain Interventions Other (Comment)  [manufacturer assistance]       CCM Care Plan  Allergies   Allergen Reactions  . Pioglitazone Swelling  . Macrobid [Nitrofurantoin Macrocrystal] Other (See Comments)    Body aches  . Nitrofurantoin Other (See Comments)    Body aches    Medications Reviewed Today    Reviewed by De Hollingshead, RPH-CPP (Pharmacist) on 02/03/20 at 1139  Med List Status: <None>  Medication Order Taking? Sig Documenting Provider Last Dose Status Informant  aspirin 81 MG tablet [id number] Yes Take 81 mg by mouth daily. [provider] Taking Active   atorvastatin (LIPITOR) 40 MG tablet [id number] Yes Take 1 tablet (40 mg total) by mouth daily. Einar Pheasant, MD Taking Active   carvedilol (COREG) 3.125 MG tablet [id number] Yes Take 1 tablet (3.125 mg total) by mouth 2 (two) times daily. Wellington [place], MD Taking Active   cyanocobalamin 1000 MCG tablet [id number] Yes Take 1,000 mcg by mouth as needed. Takes occasionally [provider] Taking Active   empagliflozin (JARDIANCE) 25 MG TABS tablet [id number] Yes Take 25 mg by mouth daily before breakfast. Einar Pheasant, MD Taking Active   Glucosamine 500 MG CAPS [id number]  Take 1 capsule by mouth as needed. [provider]  Active            Med Note Darnelle Maffucci, Maralyn Sago Sep 20, 2018 11:05 AM) Taking for wrist pain  glucose blood test strip [id number] Yes Use as instructed to blood sugar twice daily Einar Pheasant, MD Taking Active   insulin degludec (TRESIBA) 100 UNIT/ML FlexTouch Pen [id number] Yes Inject 0.06 mLs (6 Units total) into the skin 2 (  two) times daily. Einar Pheasant, MD Taking Active            Med Note Mayo Ao Feb 03, 2020 11:23 AM) Taking 4 units QAM  Insulin Pen Needle 32G X 6 MM MISC [id number] Yes To fit TRESIBA PEN Crecencio Mc, MD Taking Active   levothyroxine (SYNTHROID) 100 MCG tablet [id number] Yes TAKE 1 TABLET(100 MCG) BY MOUTH DAILY Einar Pheasant, MD Taking Active   lisinopril (ZESTRIL) 20 MG tablet [id number] Yes Take 1 tablet (20 mg  total) by mouth daily. Einar Pheasant, MD Taking Active   pantoprazole (PROTONIX) 40 MG tablet [id number] Yes TAKE 1 TABLET BY MOUTH DAILY AS NEEDED Einar Pheasant, MD Taking Active   Semaglutide,0.25 or 0.5MG /DOS, (OZEMPIC, 0.25 OR 0.5 MG/DOSE,) 2 MG/1.5ML SOPN [id number] Yes Inject 0.25 mg once weekly x 4 weeks then increase to 0.5 mg weekly Einar Pheasant, MD Taking Active            Med Note De Hollingshead   Mon Feb 03, 2020 11:22 AM) 0.5 mg          Patient Active Problem List   Diagnosis Date Noted  . Adrenal adenoma 08/19/2019  . Breast cancer screening 08/19/2019  . Thrombocytopenia ([place]) 01/27/2019  . Left wrist fracture 04/27/2018  . Cough 04/27/2018  . Abnormal EKG   . Chest pain 07/10/2017  . Renal lesion 04/10/2016  . Aortic atherosclerosis ([place]) 06/30/2015  . Personal history of tobacco use, presenting hazards to health 06/25/2015  . Left carotid bruit 12/28/2014  . Health care maintenance 07/29/2014  . Diabetes mellitus ([place]) 03/10/2012  . Hypothyroidism 03/10/2012  . Hypercholesterolemia 03/10/2012  . Hypertension 03/10/2012  . GERD (gastroesophageal reflux disease) 03/10/2012    Conditions to be addressed/monitored: HTN, HLD and DM  Patient Care Plan: Medication Management    Problem Identified: Diabetes, HLD, CKD     Long-Range Goal: Disease Progression Prevention   This Visit's Progress: On track  Recent Progress: On track  Priority: High  Note:   Current Barriers:  . Unable to independently afford treatment regimen . Unable to independently monitor therapeutic efficacy . Unable to maintain control of diabetes  Pharmacist Clinical Goal(s):  Marland Kitchen Over the next 90 days, patient will verbalize ability to afford treatment regimen . Over the next 90 days, patient will achieve control of diabetes as evidenced by A1c through collaboration with PharmD and provider.   Interventions: . 1:1 collaboration with Einar Pheasant, MD regarding development  and update of comprehensive plan of care as evidenced by provider attestation and co-signature . Inter-disciplinary care team collaboration (see longitudinal plan of care) . Comprehensive medication review performed; medication list updated in electronic medical record  Medication Management: . Previously provided BID pill box to patient.   Diabetes: . Uncontrolled; current treatment: Jardiance 25 mg daily, Ozempic 0.5 mg weekly, Tresiba 4 units daily . Received patient and provider portions of BI and Novo reapplications for 2426. Submitted application. Will collaborate w/ CPhT for follow up.   Hypertension: . Controlled per last clinic reading;  current treatment: losartan 25 mg daily, carvedilol 3.125 mg BID  . Previously recommended to continue current regimen at this time.   Hyperlipidemia and ASCVD risk reduction: . Controlled; current treatment: atorvastatin 40 mg daily . Current antiplatelet regimen: aspirin 81 mg daily, though sometimes forgets  . Previously recommended to continue current regimen at this time  Hypothyroidism: . Controlled per last TSH; current regimen: levothyroxine 100 mcg daily . Continue  current regimen at this time  Tobacco Abuse: . 1 packs per day; denies being in a place to be able to work on tobacco cessation d/t stressors in her life.  . Will continue to evaluate readiness to quit and remind of health benefits  Acid Reflux: . Controlled per patient report; current regimen: pantoprazole 40 mg PRN . Continue current regimen at this time  Supplements: . Reports current use of glucosamine PRN wrist pain w/ benefit; vitamin B12 1000 units daily . Continue current regimen at this time  Patient Goals/Self-Care Activities . Over the next 90 days, patient will:  - take medications as prescribed check glucose TID, document, and provide at future appointments collaborate with provider on medication access solutions  Follow Up Plan: Telephone follow up  appointment with care management team member scheduled for: ~ 2 weeks as previously scheduled       Medication Assistance: Application for [employer] (Ozempic, [place]) and BI ([place]) medication assistance program in process. Anticipated assistance start date TBD. See plan of care above for additional detail.    Plan: Telephone follow up appointment with care management team member scheduled for:~ 2 weeks as previously scheduled  [name], PharmD, [place], [place] Pharmacist [employer] at [facility] [phone]

## 2020-02-14 NOTE — Patient Instructions (Signed)
Visit Information  Patient Care Plan: Medication Management    Problem Identified: Diabetes, HLD, CKD     Long-Range Goal: Disease Progression Prevention   This Visit's Progress: On track  Recent Progress: On track  Priority: High  Note:   Current Barriers:  . Unable to independently afford treatment regimen . Unable to independently monitor therapeutic efficacy . Unable to maintain control of diabetes  Pharmacist Clinical Goal(s):  Marland Kitchen Over the next 90 days, patient will verbalize ability to afford treatment regimen . Over the next 90 days, patient will achieve control of diabetes as evidenced by A1c through collaboration with PharmD and provider.   Interventions: . 1:1 collaboration with Einar Pheasant, MD regarding development and update of comprehensive plan of care as evidenced by provider attestation and co-signature . Inter-disciplinary care team collaboration (see longitudinal plan of care) . Comprehensive medication review performed; medication list updated in electronic medical record  Medication Management: . Previously provided BID pill box to patient.   Diabetes: . Uncontrolled; current treatment: Jardiance 25 mg daily, Ozempic 0.5 mg weekly, Tresiba 4 units daily . Received patient and provider portions of BI and Novo reapplications for 6301. Submitted application. Will collaborate w/ CPhT for follow up.   Hypertension: . Controlled per last clinic reading;  current treatment: losartan 25 mg daily, carvedilol 3.125 mg BID  . Previously recommended to continue current regimen at this time.   Hyperlipidemia and ASCVD risk reduction: . Controlled; current treatment: atorvastatin 40 mg daily . Current antiplatelet regimen: aspirin 81 mg daily, though sometimes forgets  . Previously recommended to continue current regimen at this time  Hypothyroidism: . Controlled per last TSH; current regimen: levothyroxine 100 mcg daily . Continue current regimen at this  time  Tobacco Abuse: . 1 packs per day; denies being in a place to be able to work on tobacco cessation d/t stressors in her life.  . Will continue to evaluate readiness to quit and remind of health benefits  Acid Reflux: . Controlled per patient report; current regimen: pantoprazole 40 mg PRN . Continue current regimen at this time  Supplements: . Reports current use of glucosamine PRN wrist pain w/ benefit; vitamin B12 1000 units daily . Continue current regimen at this time  Patient Goals/Self-Care Activities . Over the next 90 days, patient will:  - take medications as prescribed check glucose TID, document, and provide at future appointments collaborate with provider on medication access solutions  Follow Up Plan: Telephone follow up appointment with care management team member scheduled for: ~ 2 weeks as previously scheduled       The patient verbalized understanding of instructions, educational materials, and care plan provided today and declined offer to receive copy of patient instructions, educational materials, and care plan.  Plan: Telephone follow up appointment with care management team member scheduled for:~ 2 weeks as previously scheduled  [name], PharmD, [place], [place] Pharmacist [employer] at [facility] [phone]

## 2020-02-20 ENCOUNTER — Telehealth: Payer: Self-pay | Admitting: Pharmacist

## 2020-02-20 ENCOUNTER — Ambulatory Visit: Payer: Medicare Other | Admitting: Pharmacist

## 2020-02-20 DIAGNOSIS — E1165 Type 2 diabetes mellitus with hyperglycemia: Secondary | ICD-10-CM

## 2020-02-20 DIAGNOSIS — I700 Atherosclerosis of aorta: Secondary | ICD-10-CM

## 2020-02-20 DIAGNOSIS — E7800 Pure hypercholesterolemia, unspecified: Secondary | ICD-10-CM

## 2020-02-20 DIAGNOSIS — I10 Essential (primary) hypertension: Secondary | ICD-10-CM

## 2020-02-20 NOTE — Patient Instructions (Signed)
Visit Information  Patient Care Plan: Medication Management    Problem Identified: Diabetes, HLD, CKD     Long-Range Goal: Disease Progression Prevention   Recent Progress: On track  Priority: High  Note:   Current Barriers:  . Unable to independently afford treatment regimen . Unable to independently monitor therapeutic efficacy . Unable to maintain control of diabetes  Pharmacist Clinical Goal(s):  Marland Kitchen Over the next 90 days, patient will verbalize ability to afford treatment regimen . Over the next 90 days, patient will achieve control of diabetes as evidenced by A1c through collaboration with PharmD and provider.   Interventions: . 1:1 collaboration with Einar Pheasant, MD regarding development and update of comprehensive plan of care as evidenced by provider attestation and co-signature . Inter-disciplinary care team collaboration (see longitudinal plan of care) . Comprehensive medication review performed; medication list updated in electronic medical record  Medication Management: . Previously provided BID pill box to patient.   Diabetes: . Uncontrolled; current treatment: Jardiance 25 mg daily, Ozempic 0.5 mg weekly, Tresiba 4 units daily . Patient reports today that she felt it was "too much" to take [place] every day. Has been taking on alternating days. Notes that she felt that GI upset was worse taking Jardiance every day  . Received notification from [facility] that patient was over income for assistance (they lowered income cut off this year). 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 Patient amenable to switch. Continue Jardiance 25 mg daily until supply finished then start Farxiga 10 mg daily . Will collaborate w/ CPhT to mail patient portion of [place] app. Will collaborate w/ PCP on provider portion . Discussed variability of effect of Tresiba and Jardiance QOD. Discussed holding Tyler Aas, continuing just Jardiance 25 mg daily, Ozempic  0.5 mg weekly. Patient amenable. Will review glucose readings at scheduled call in 2 weeks.  . Reviewed that any GI upset (constipation, fullness) more likely related to Ozempic, but generally improves over time. Continue to monitor.   Hypertension: . Controlled per last clinic reading;  current treatment: losartan 25 mg daily, carvedilol 3.125 mg BID  . Previously recommended to continue current regimen.  Hyperlipidemia and ASCVD risk reduction: . Controlled; current treatment: atorvastatin 40 mg daily . Current antiplatelet regimen: aspirin 81 mg daily . Previously recommended to continue current regimen  Hypothyroidism: . Controlled per last TSH; current regimen: levothyroxine 100 mcg daily . Continue current regimen at this time  Tobacco Abuse: . 1 packs per day; denies being in a place to be able to work on tobacco cessation d/t stressors in her life.  . Will continue to evaluate readiness to quit and remind of health benefits  Acid Reflux: . Controlled per patient report; current regimen: pantoprazole 40 mg PRN . Continue current regimen at this time  Supplements: . Reports current use of glucosamine PRN wrist pain w/ benefit; vitamin B12 1000 units daily . Continue current regimen at this time  Patient Goals/Self-Care Activities . Over the next 90 days, patient will:  - take medications as prescribed check glucose TID, document, and provide at future appointments collaborate with provider on medication access solutions  Follow Up Plan: Telephone follow up appointment with care management team member scheduled for: ~ 2 weeks as previously scheduled       The patient verbalized understanding of instructions, educational materials, and care plan provided today and declined offer to receive copy of patient instructions, educational materials, and care plan.   Plan: Telephone follow  up appointment with care management team member scheduled for:  ~ 2 weeks as previously  scheduled  [name], PharmD, [place], [place] Pharmacist [employer] at [facility] [phone]

## 2020-02-20 NOTE — Telephone Encounter (Signed)
  Chronic Care Management   Note  02/20/2020 Name: Ann Collins MRN: [id number] DOB: 1945-09-20   Attempted to contact patient regarding Jardiance patient assistance. She was denied due to being over income. Will discuss changing to [name], as [employer] patient assistance as a higher income cut off   Left HIPAA compliant message for patient to return my call at their convenience.    [name], PharmD, [place], [place] Clinical Pharmacist [employer] at [place]

## 2020-02-20 NOTE — Telephone Encounter (Signed)
Patient called back. See CCM documentation

## 2020-02-20 NOTE — Chronic Care Management (AMB) (Signed)
Chronic Care Management Pharmacy Note  02/20/2020 Name:  Ann Collins MRN:  [id number] DOB:  [birth year]  Subjective: Ann Collins is an 75 y.o. year old female who is a primary patient of Einar Pheasant, MD.  The CCM team was consulted for assistance with disease management and care coordination needs.    Engaged with patient by telephone for medication access updates in response to provider referral for pharmacy case management and/or care coordination services.   Consent to Services:  The patient was given information about Chronic Care Management services, agreed to services, and gave verbal consent prior to initiation of services.  Please see initial visit note for detailed documentation.   Objective:  Lab Results  Component Value Date   CREATININE 1.16 12/16/2019   CREATININE 1.04 08/13/2019   CREATININE 1.09 04/23/2019    Lab Results  Component Value Date   HGBA1C 7.9 (H) 12/16/2019       Component Value Date/Time   CHOL 129 12/16/2019 0943   CHOL 134 10/18/2017 1447   TRIG 120.0 12/16/2019 0943   HDL 41.50 12/16/2019 0943   HDL 41 10/18/2017 1447   CHOLHDL 3 12/16/2019 0943   VLDL 24.0 12/16/2019 0943   LDLCALC 63 12/16/2019 0943   LDLCALC 63 10/18/2017 1447   LDLDIRECT 71 10/18/2017 1447   LDLDIRECT 156.0 08/29/2016 1523    BP Readings from Last 3 Encounters:  12/16/19 128/70  08/13/19 122/70  07/09/19 120/60    Assessment: Review of patient past medical history, allergies, medications, health status, including review of consultants reports, laboratory and other test data, was performed as part of comprehensive evaluation and provision of chronic care management services.   SDOH:  (Social Determinants of Health) assessments and interventions performed:    CCM Care Plan  Allergies  Allergen Reactions  . Pioglitazone Swelling  . Macrobid [Nitrofurantoin Macrocrystal] Other (See Comments)    Body aches  . Nitrofurantoin Other (See Comments)     Body aches    Medications Reviewed Today    Reviewed by De Hollingshead, RPH-CPP (Pharmacist) on 02/03/20 at 1139  Med List Status: <None>  Medication Order Taking? Sig Documenting Provider Last Dose Status Informant  aspirin 81 MG tablet [id number] Yes Take 81 mg by mouth daily. [provider] Taking Active   atorvastatin (LIPITOR) 40 MG tablet [id number] Yes Take 1 tablet (40 mg total) by mouth daily. Einar Pheasant, MD Taking Active   carvedilol (COREG) 3.125 MG tablet [id number] Yes Take 1 tablet (3.125 mg total) by mouth 2 (two) times daily. Wellington Hampshire, MD Taking Active   cyanocobalamin 1000 MCG tablet [id number] Yes Take 1,000 mcg by mouth as needed. Takes occasionally [provider] Taking Active   empagliflozin (JARDIANCE) 25 MG TABS tablet [id number] Yes Take 25 mg by mouth daily before breakfast. Einar Pheasant, MD Taking Active   Glucosamine 500 MG CAPS [id number]  Take 1 capsule by mouth as needed. [provider]  Active            Med Note Darnelle Maffucci, Maralyn Sago Sep 20, 2018 11:05 AM) Taking for wrist pain  glucose blood test strip [id number] Yes Use as instructed to blood sugar twice daily Einar Pheasant, MD Taking Active   insulin degludec (TRESIBA) 100 UNIT/ML FlexTouch Pen [id number] Yes Inject 0.06 mLs (6 Units total) into the skin 2 (two) times daily. Einar Pheasant, MD Taking Active            Med Note Darnelle Maffucci,  Waynette Buttery Feb 03, 2020 11:23 AM) Taking 4 units QAM  Insulin Pen Needle 32G X 6 MM MISC [id number] Yes To fit TRESIBA PEN Crecencio Mc, MD Taking Active   levothyroxine (SYNTHROID) 100 MCG tablet [id number] Yes TAKE 1 TABLET(100 MCG) BY MOUTH DAILY Einar Pheasant, MD Taking Active   lisinopril (ZESTRIL) 20 MG tablet [id number] Yes Take 1 tablet (20 mg total) by mouth daily. Einar Pheasant, MD Taking Active   pantoprazole (PROTONIX) 40 MG tablet [id number] Yes TAKE 1 TABLET BY MOUTH DAILY AS NEEDED Einar Pheasant,  MD Taking Active   Semaglutide,0.25 or 0.5MG /DOS, (OZEMPIC, 0.25 OR 0.5 MG/DOSE,) 2 MG/1.5ML SOPN [id number] Yes Inject 0.25 mg once weekly x 4 weeks then increase to 0.5 mg weekly Einar Pheasant, MD Taking Active            Med Note De Hollingshead   Mon Feb 03, 2020 11:22 AM) 0.5 mg          Patient Active Problem List   Diagnosis Date Noted  . Adrenal adenoma 08/19/2019  . Breast cancer screening 08/19/2019  . Thrombocytopenia ([place]) 01/27/2019  . Left wrist fracture 04/27/2018  . Cough 04/27/2018  . Abnormal EKG   . Chest pain 07/10/2017  . Renal lesion 04/10/2016  . Aortic atherosclerosis ([place]) 06/30/2015  . Personal history of tobacco use, presenting hazards to health 06/25/2015  . Left carotid bruit 12/28/2014  . Health care maintenance 07/29/2014  . Diabetes mellitus ([place]) 03/10/2012  . Hypothyroidism 03/10/2012  . Hypercholesterolemia 03/10/2012  . Hypertension 03/10/2012  . GERD (gastroesophageal reflux disease) 03/10/2012    Conditions to be addressed/monitored: CAD, HTN, DMII and CKD  Care Plan : Medication Management  Updates made by De Hollingshead, RPH-CPP since 02/20/2020 12:00 AM    Problem: Diabetes, HLD, CKD     Long-Range Goal: Disease Progression Prevention   Recent Progress: On track  Priority: High  Note:   Current Barriers:  . Unable to independently afford treatment regimen . Unable to independently monitor therapeutic efficacy . Unable to maintain control of diabetes  Pharmacist Clinical Goal(s):  Marland Kitchen Over the next 90 days, patient will verbalize ability to afford treatment regimen . Over the next 90 days, patient will achieve control of diabetes as evidenced by A1c through collaboration with PharmD and provider.   Interventions: . 1:1 collaboration with Einar Pheasant, MD regarding development and update of comprehensive plan of care as evidenced by provider attestation and co-signature . Inter-disciplinary care team collaboration  (see longitudinal plan of care) . Comprehensive medication review performed; medication list updated in electronic medical record  Medication Management: . Previously provided BID pill box to patient.   Diabetes: . Uncontrolled; current treatment: Jardiance 25 mg daily, Ozempic 0.5 mg weekly, Tresiba 4 units daily . Patient reports today that she felt it was "too much" to take [place] every day. Has been taking on alternating days. Notes that she felt that GI upset was worse taking Jardiance every day  . Received notification from [facility] that patient was over income for assistance (they lowered income cut off this year). 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 Patient amenable to switch. Continue Jardiance 25 mg daily until supply finished then start Farxiga 10 mg daily . Will collaborate w/ CPhT to mail patient portion of [place] app. Will collaborate w/ PCP on provider portion . Discussed variability of effect of Tresiba and Jardiance QOD. Discussed holding Tyler Aas, continuing just [place]  25 mg daily, Ozempic 0.5 mg weekly. Patient amenable. Will review glucose readings at scheduled call in 2 weeks.  . Reviewed that any GI upset (constipation, fullness) more likely related to Ozempic, but generally improves over time. Continue to monitor.   Hypertension: . Controlled per last clinic reading;  current treatment: losartan 25 mg daily, carvedilol 3.125 mg BID  . Previously recommended to continue current regimen.  Hyperlipidemia and ASCVD risk reduction: . Controlled; current treatment: atorvastatin 40 mg daily . Current antiplatelet regimen: aspirin 81 mg daily . Previously recommended to continue current regimen  Hypothyroidism: . Controlled per last TSH; current regimen: levothyroxine 100 mcg daily . Continue current regimen at this time  Tobacco Abuse: . 1 packs per day; denies being in a place to be able to work on tobacco cessation d/t  stressors in her life.  . Will continue to evaluate readiness to quit and remind of health benefits  Acid Reflux: . Controlled per patient report; current regimen: pantoprazole 40 mg PRN . Continue current regimen at this time  Supplements: . Reports current use of glucosamine PRN wrist pain w/ benefit; vitamin B12 1000 units daily . Continue current regimen at this time  Patient Goals/Self-Care Activities . Over the next 90 days, patient will:  - take medications as prescribed check glucose TID, document, and provide at future appointments collaborate with provider on medication access solutions  Follow Up Plan: Telephone follow up appointment with care management team member scheduled for: ~ 2 weeks as previously scheduled       Medication Assistance: Application for [name])  medication assistance program. in process.  Anticipated assistance start date TBD.  See plan of care for additional detail. Approved for [name] assistance through [employer] through 02/06/21  Follow Up:  Patient agrees to Care Plan and Follow-up.  Plan: Telephone follow up appointment with care management team member scheduled for:  ~ 2 weeks as previously scheduled  [name], PharmD, [place], [place] Clinical Pharmacist [employer] at [facility] [phone]

## 2020-03-04 ENCOUNTER — Ambulatory Visit: Payer: Medicare Other | Admitting: Pharmacist

## 2020-03-04 DIAGNOSIS — E7800 Pure hypercholesterolemia, unspecified: Secondary | ICD-10-CM

## 2020-03-04 DIAGNOSIS — I700 Atherosclerosis of aorta: Secondary | ICD-10-CM

## 2020-03-04 DIAGNOSIS — E1165 Type 2 diabetes mellitus with hyperglycemia: Secondary | ICD-10-CM

## 2020-03-04 DIAGNOSIS — I10 Essential (primary) hypertension: Secondary | ICD-10-CM

## 2020-03-04 DIAGNOSIS — E039 Hypothyroidism, unspecified: Secondary | ICD-10-CM

## 2020-03-04 NOTE — Chronic Care Management (AMB) (Signed)
Chronic Care Management Pharmacy Note  03/04/2020 Name:  KEYSI OELKERS MRN:  [id number] DOB:  [birth year]  Subjective: Ann Collins is an 75 y.o. year old female who is a primary patient of Einar Pheasant, MD.  The CCM team was consulted for assistance with disease management and care coordination needs.    Engaged with patient by telephone for follow up visit in response to provider referral for pharmacy case management and/or care coordination services.   Consent to Services:  The patient was given information about Chronic Care Management services, agreed to services, and gave verbal consent prior to initiation of services.  Please see initial visit note for detailed documentation.   Objective:  Lab Results  Component Value Date   CREATININE 1.16 12/16/2019   CREATININE 1.04 08/13/2019   CREATININE 1.09 04/23/2019    Lab Results  Component Value Date   HGBA1C 7.9 (H) 12/16/2019       Component Value Date/Time   CHOL 129 12/16/2019 0943   CHOL 134 10/18/2017 1447   TRIG 120.0 12/16/2019 0943   HDL 41.50 12/16/2019 0943   HDL 41 10/18/2017 1447   CHOLHDL 3 12/16/2019 0943   VLDL 24.0 12/16/2019 0943   LDLCALC 63 12/16/2019 0943   LDLCALC 63 10/18/2017 1447   LDLDIRECT 71 10/18/2017 1447   LDLDIRECT 156.0 08/29/2016 1523   Lab Results  Component Value Date   TSH 3.75 08/13/2019     Clinical ASCVD: No  The ASCVD Risk score ([place]., et al., 2013) failed to calculate for the following reasons:   The valid total cholesterol range is 130 to 320 mg/dL      BP Readings from Last 3 Encounters:  12/16/19 128/70  08/13/19 122/70  07/09/19 120/60    Assessment: Review of patient past medical history, allergies, medications, health status, including review of consultants reports, laboratory and other test data, was performed as part of comprehensive evaluation and provision of chronic care management services.   SDOH:  (Social Determinants of Health)  assessments and interventions performed:  SDOH Interventions   Flowsheet Row Most Recent Value  SDOH Interventions   Financial Strain Interventions Other (Comment)  [manufacturer assistance]      CCM Care Plan  Allergies  Allergen Reactions  . Pioglitazone Swelling  . Macrobid [Nitrofurantoin Macrocrystal] Other (See Comments)    Body aches  . Nitrofurantoin Other (See Comments)    Body aches    Medications Reviewed Today    Reviewed by De Hollingshead, RPH-CPP (Pharmacist) on 03/04/20 at 1157  Med List Status: <None>  Medication Order Taking? Sig Documenting Provider Last Dose Status Informant  aspirin 81 MG tablet [id number]  Take 81 mg by mouth daily. [provider]  Active   atorvastatin (LIPITOR) 40 MG tablet [id number]  Take 1 tablet (40 mg total) by mouth daily. Einar Pheasant, MD  Active   carvedilol (COREG) 3.125 MG tablet [id number]  Take 1 tablet (3.125 mg total) by mouth 2 (two) times daily. Wellington Hampshire, MD  Active   cyanocobalamin 1000 MCG tablet [id number]  Take 1,000 mcg by mouth as needed. Takes occasionally [provider]  Active   empagliflozin (JARDIANCE) 25 MG TABS tablet [id number] Yes Take 25 mg by mouth daily before breakfast. Einar Pheasant, MD Taking Active   Glucosamine 500 MG CAPS [id number]  Take 1 capsule by mouth as needed. [provider]  Active            Med Note (Shalissa [place],  Gena Laski E   Thu Sep 20, 2018 11:05 AM) Taking for wrist pain  glucose blood test strip [id number]  Use as instructed to blood sugar twice daily Einar Pheasant, MD  Active   Insulin Pen Needle 32G X 6 MM MISC [id number] Yes To fit TRESIBA PEN Crecencio Mc, MD Taking Active   levothyroxine (SYNTHROID) 100 MCG tablet [id number]  TAKE 1 TABLET(100 MCG) BY MOUTH DAILY Einar Pheasant, MD  Active   lisinopril (ZESTRIL) 20 MG tablet [id number]  Take 1 tablet (20 mg total) by mouth daily. Einar Pheasant, MD  Active   pantoprazole (PROTONIX) 40 MG  tablet [id number]  TAKE 1 TABLET BY MOUTH DAILY AS NEEDED Einar Pheasant, MD  Active   Semaglutide,0.25 or 0.5MG /DOS, (OZEMPIC, 0.25 OR 0.5 MG/DOSE,) 2 MG/1.5ML SOPN [id number] Yes Inject 0.25 mg once weekly x 4 weeks then increase to 0.5 mg weekly Einar Pheasant, MD Taking Active            Med Note De Hollingshead   Mon Feb 03, 2020 11:22 AM) 0.5 mg          Patient Active Problem List   Diagnosis Date Noted  . Adrenal adenoma 08/19/2019  . Breast cancer screening 08/19/2019  . Thrombocytopenia ([place]) 01/27/2019  . Left wrist fracture 04/27/2018  . Cough 04/27/2018  . Abnormal EKG   . Chest pain 07/10/2017  . Renal lesion 04/10/2016  . Aortic atherosclerosis ([place]) 06/30/2015  . Personal history of tobacco use, presenting hazards to health 06/25/2015  . Left carotid bruit 12/28/2014  . Health care maintenance 07/29/2014  . Diabetes mellitus ([place]) 03/10/2012  . Hypothyroidism 03/10/2012  . Hypercholesterolemia 03/10/2012  . Hypertension 03/10/2012  . GERD (gastroesophageal reflux disease) 03/10/2012    Conditions to be addressed/monitored: HTN, HLD and DMII  Care Plan : Medication Management  Updates made by De Hollingshead, RPH-CPP since 03/04/2020 12:00 AM    Problem: Diabetes, HLD, CKD     Long-Range Goal: Disease Progression Prevention   This Visit's Progress: On track  Recent Progress: On track  Priority: High  Note:   Current Barriers:  . Unable to independently afford treatment regimen . Unable to independently monitor therapeutic efficacy . Unable to maintain control of diabetes  Pharmacist Clinical Goal(s):  Marland Kitchen Over the next 90 days, patient will verbalize ability to afford treatment regimen . Over the next 90 days, patient will achieve control of diabetes as evidenced by A1c through collaboration with PharmD and provider.   Interventions: . 1:1 collaboration with Einar Pheasant, MD regarding development and update of comprehensive plan of care as  evidenced by provider attestation and co-signature . Inter-disciplinary care team collaboration (see longitudinal plan of care) . Comprehensive medication review performed; medication list updated in electronic medical record  Medication Management: . Previously provided BID pill box to patient. Patient endorses that she continues to use   Diabetes: . Uncontrolled; current treatment: Jardiance 25 mg daily, Ozempic 0.5 mg weekly- plan to switch Jardiance to [place], as patient no longer qualifies for [employer] assistance but does for [employer]  o Hx metformin- stopped after a 2019 hospitalization, reason for discontinuation unclear  o Hx Tresiba - had titrated down to 4 units, discontinued ~ 2 weeks ago to see if still needed . Patient does endorse stomach fullness and impact on appetite, but denies feeling uncomfortable. Encouraged by reduction in medications.  . Current glucose readings: patient reports fastings 130-140, post meal 150-160s . Farxiga application to [facility] submitted yesterday  by CPhT.  . Reviewed goal A1c, goal fasting, and goal 2 hour post prandial glucose . Reviewed long term ASCVD and renal benefits of SGLT2 and GLP . Continue current regimen. Reiterated to change to Farxiga 10 mg daily when Jardiance 25 mg daily supply is completed  Hypertension: . Controlled per last clinic reading; current treatment: losartan 25 mg daily, carvedilol 3.125 mg BID  . Fill history up to date . Recommended to continue current regimen. Will discuss occasional home checks at future appointments.   Hyperlipidemia and ASCVD risk reduction: . Controlled per last lipid panel; current treatment: atorvastatin 40 mg daily . Current antiplatelet regimen: aspirin 81 mg daily . Fill history up to date  . Recommended to continue current regimen.  Hypothyroidism: . Controlled per last lab work; current regimen: levothyroxine 100 mcg daily . Continue current regimen at this time  Tobacco Abuse: . 1  packs per day; denies being in a place to be able to work on tobacco cessation d/t stressors in her life.  . 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 Controlled per patient report; current regimen: pantoprazole 40 mg PRN . Continue current regimen at this time. Will discuss ability for trial of H2RA instead in the future.   Supplements: . Reports current use of glucosamine PRN wrist pain w/ benefit; vitamin B12 1000 units daily . No interactions noted. Continue current regimen at this time  Patient Goals/Self-Care Activities . Over the next 90 days, patient will:  - take medications as prescribed check glucose TID, document, and provide at future appointments collaborate with provider on medication access solutions  Follow Up Plan: Telephone follow up appointment with care management team member scheduled for: ~ 6 weeks (PCP visit in ~ 4 weeks)      Medication Assistance: Ozempic obtained through [employer] medication assistance program.  Enrollment ends 01/06/21. [place] application submitted to [employer].   Follow Up:  Patient agrees to Care Plan and Follow-up.  Plan: Telephone follow up appointment with care management team member scheduled for:  ~ 6 weeks  [name], PharmD, [place], [place] Clinical Pharmacist [employer] at [employer] [phone]

## 2020-03-04 NOTE — Patient Instructions (Signed)
Visit Information  Goals Addressed              This Visit's Progress     Patient Stated   .  Medication Monitoring (pt-stated)        Patient Goals/Self-Care Activities . Over the next 90 days, patient will:  - take medications as prescribed check glucose TID, document, and provide at future appointments collaborate with provider on medication access solutions         The patient verbalized understanding of instructions, educational materials, and care plan provided today and declined offer to receive copy of patient instructions, educational materials, and care plan.   Plan: Telephone follow up appointment with care management team member scheduled for:  ~ 6 weeks  [name], PharmD, [place], [place] Clinical Pharmacist [employer] at [employer] [phone]

## 2020-03-11 ENCOUNTER — Telehealth: Payer: Self-pay

## 2020-03-11 NOTE — Telephone Encounter (Signed)
Ozempic and [place] received. Pt aware. Medication has been labeled and placed in refrigerator for pick up. Ann Collins is no longer on med list but per [name] patient needs to hold on to it until we determine if she needs to go back on Tresiba.

## 2020-03-23 ENCOUNTER — Other Ambulatory Visit: Payer: Self-pay

## 2020-03-23 ENCOUNTER — Ambulatory Visit (INDEPENDENT_AMBULATORY_CARE_PROVIDER_SITE_OTHER): Payer: Medicare Other | Admitting: Internal Medicine

## 2020-03-23 VITALS — BP 132/68 | HR 70 | Temp 97.8°F | Resp 16 | Ht 62.0 in | Wt 134.8 lb

## 2020-03-23 DIAGNOSIS — E039 Hypothyroidism, unspecified: Secondary | ICD-10-CM | POA: Diagnosis not present

## 2020-03-23 DIAGNOSIS — I700 Atherosclerosis of aorta: Secondary | ICD-10-CM

## 2020-03-23 DIAGNOSIS — I10 Essential (primary) hypertension: Secondary | ICD-10-CM

## 2020-03-23 DIAGNOSIS — D696 Thrombocytopenia, unspecified: Secondary | ICD-10-CM | POA: Diagnosis not present

## 2020-03-23 DIAGNOSIS — L989 Disorder of the skin and subcutaneous tissue, unspecified: Secondary | ICD-10-CM | POA: Diagnosis not present

## 2020-03-23 DIAGNOSIS — E7800 Pure hypercholesterolemia, unspecified: Secondary | ICD-10-CM | POA: Diagnosis not present

## 2020-03-23 DIAGNOSIS — K2100 Gastro-esophageal reflux disease with esophagitis, without bleeding: Secondary | ICD-10-CM

## 2020-03-23 DIAGNOSIS — Z87891 Personal history of nicotine dependence: Secondary | ICD-10-CM

## 2020-03-23 DIAGNOSIS — E1165 Type 2 diabetes mellitus with hyperglycemia: Secondary | ICD-10-CM

## 2020-03-23 MED ORDER — TRIAMCINOLONE ACETONIDE 0.1 % EX CREA
1.0000 | TOPICAL_CREAM | Freq: Two times a day (BID) | CUTANEOUS | 0 refills | Status: DC
Start: 2020-03-23 — End: 2022-06-16

## 2020-03-23 NOTE — Progress Notes (Signed)
Patient ID: PAYGE EPPES, female   DOB: Jul 28, 1945, 75 y.o.   MRN: [id number]   Subjective:    Patient ID: LOUISIANA SEARLES, female    DOB: 07/26/1945, 75 y.o.   MRN: [id number]  HPI This visit occurred during the SARS-CoV-2 public health emergency.  Safety protocols were in place, including screening questions prior to the visit, additional usage of staff PPE, and extensive cleaning of exam room while observing appropriate contact time as indicated for disinfecting solutions.  Patient here for a scheduled follow up.  Here to follow up regarding her blood sugar, blood pressure and cholesterol.  She reports she is doing better.  Feels better.  Still with increased stress.  Discussed.  Overall she feels she is handling things relatively well.  Tries to stay active.  No chest pain or sob reported.  Off jardiance.  Tapering offTresiba.  States am sugars 139-159.  Blood pressures doing well.  No nausea or vomiting reported.  No abdominal pain.  Bowels moving.  Arm lesion.    Past Medical History:  Diagnosis Date  . Abdominal hernia   . CAD (coronary artery disease)    a. LHC 7/19: LM nl, p-mLAD 100% w/ right to left collaterals, OM2 90% (2 mm), 1st LPL 40%, medical management  . Diabetes mellitus ([place])   . Diverticulosis   . GERD (gastroesophageal reflux disease)   . Hematuria   . Hypercholesterolemia   . Hypertension   . Hypothyroidism   . Kidney stones    Past Surgical History:  Procedure Laterality Date  . APPENDECTOMY    . BREAST BIOPSY  1969   right  . CHOLECYSTECTOMY  2005  . COLONOSCOPY    . COLONOSCOPY WITH PROPOFOL N/A 09/19/2017   Procedure: COLONOSCOPY WITH PROPOFOL;  Surgeon: Toledo, Benay Pike, MD;  Location: [facility];  Service: Gastroenterology;  Laterality: N/A;  . ESOPHAGOGASTRODUODENOSCOPY (EGD) WITH PROPOFOL N/A 09/19/2017   Procedure: ESOPHAGOGASTRODUODENOSCOPY (EGD) WITH PROPOFOL;  Surgeon: Toledo, Benay Pike, MD;  Location: [facility];  Service:  Gastroenterology;  Laterality: N/A;  . LEFT HEART CATH AND CORONARY ANGIOGRAPHY Left 08/07/2017   Procedure: LEFT HEART CATH AND CORONARY ANGIOGRAPHY;  Surgeon: Wellington Hampshire, MD;  Location: [facility];  Service: Cardiovascular;  Laterality: Left;  . UPPER GI ENDOSCOPY     Family History  Problem Relation Age of Onset  . Brain cancer Mother        history of brain tumor  . Lung disease Father   . Diabetes Brother        x2  . Bipolar disorder Son   . Alcohol abuse Son   . Breast cancer Neg Hx   . Colon cancer Neg Hx    Social History   Socioeconomic History  . Marital status: Married    Spouse name: Not on file  . Number of children: Not on file  . Years of education: Not on file  . Highest education level: Not on file  Occupational History  . Not on file  Tobacco Use  . Smoking status: Current Every Day Smoker    Packs/day: 1.00    Years: 50.00    Pack years: 50.00  . Smokeless tobacco: Never Used  Vaping Use  . Vaping Use: Never used  Substance and Sexual Activity  . Alcohol use: No    Alcohol/week: 0.0 standard drinks  . Drug use: No  . Sexual activity: Not on file  Other Topics Concern  . Not on file  Social History Narrative  . Not on file   Social Determinants of Health   Financial Resource Strain: Medium Risk  . Difficulty of Paying Living Expenses: Somewhat hard  Food Insecurity: Not on file  Transportation Needs: Not on file  Physical Activity: Not on file  Stress: Stress Concern Present  . Feeling of Stress : To some extent  Social Connections: Not on file    Outpatient Encounter Medications as of 03/23/2020  Medication Sig  . triamcinolone (KENALOG) 0.1 % Apply 1 application topically 2 (two) times daily.  Marland Kitchen aspirin 81 MG tablet Take 81 mg by mouth daily.  Marland Kitchen atorvastatin (LIPITOR) 40 MG tablet Take 1 tablet (40 mg total) by mouth daily.  . carvedilol (COREG) 3.125 MG tablet Take 1 tablet (3.125 mg total) by mouth 2 (two) times daily.   . cyanocobalamin 1000 MCG tablet Take 1,000 mcg by mouth as needed. Takes occasionally  . empagliflozin (JARDIANCE) 25 MG TABS tablet Take 25 mg by mouth daily before breakfast.  . Glucosamine 500 MG CAPS Take 1 capsule by mouth as needed.  Marland Kitchen glucose blood test strip Use as instructed to blood sugar twice daily  . Insulin Pen Needle 32G X 6 MM MISC To fit TRESIBA PEN  . levothyroxine (SYNTHROID) 100 MCG tablet TAKE 1 TABLET(100 MCG) BY MOUTH DAILY  . lisinopril (ZESTRIL) 20 MG tablet Take 1 tablet (20 mg total) by mouth daily.  . pantoprazole (PROTONIX) 40 MG tablet TAKE 1 TABLET BY MOUTH DAILY AS NEEDED  . Semaglutide,0.25 or 0.5MG/DOS, (OZEMPIC, 0.25 OR 0.5 MG/DOSE,) 2 MG/1.5ML SOPN Inject 0.25 mg once weekly x 4 weeks then increase to 0.5 mg weekly   No facility-administered encounter medications on file as of 03/23/2020.    Review of Systems  Constitutional: Negative for appetite change and unexpected weight change.  HENT: Negative for congestion and sinus pressure.   Respiratory: Negative for cough, chest tightness and shortness of breath.   Cardiovascular: Negative for chest pain, palpitations and leg swelling.  Gastrointestinal: Negative for abdominal pain, diarrhea, nausea and vomiting.  Genitourinary: Negative for difficulty urinating and dysuria.  Musculoskeletal: Negative for joint swelling and myalgias.  Skin: Negative for color change and wound.  Neurological: Negative for dizziness, light-headedness and headaches.  Psychiatric/Behavioral: Negative for agitation and dysphoric mood.       Objective:    Physical Exam Vitals reviewed.  Constitutional:      General: She is not in acute distress.    Appearance: Normal appearance.  HENT:     Head: Normocephalic and atraumatic.     Right Ear: External ear normal.     Left Ear: External ear normal.     Mouth/Throat:     Mouth: Oropharynx is clear and moist.  Eyes:     General: No scleral icterus.       Right eye: No  discharge.        Left eye: No discharge.     Conjunctiva/sclera: Conjunctivae normal.  Neck:     Thyroid: No thyromegaly.  Cardiovascular:     Rate and Rhythm: Normal rate and regular rhythm.  Pulmonary:     Effort: No respiratory distress.     Breath sounds: Normal breath sounds. No wheezing.  Abdominal:     General: Bowel sounds are normal.     Palpations: Abdomen is soft.     Tenderness: There is no abdominal tenderness.  Musculoskeletal:        General: No swelling, tenderness or edema.  Cervical back: Neck supple. No tenderness.  Lymphadenopathy:     Cervical: No cervical adenopathy.  Skin:    Findings: No erythema.     Comments: Left arm lesion.   Neurological:     Mental Status: She is alert.  Psychiatric:        Mood and Affect: Mood normal.        Behavior: Behavior normal.     BP 132/68   Pulse 70   Temp 97.8 F (36.6 C) (Oral)   Resp 16   Ht 5' 2"  (1.575 m)   Wt 134 lb 12.8 oz (61.1 kg)   LMP 03/10/1991   SpO2 98%   BMI 24.66 kg/m  Wt Readings from Last 3 Encounters:  03/23/20 134 lb 12.8 oz (61.1 kg)  12/16/19 143 lb 6.4 oz (65 kg)  08/26/19 145 lb (65.8 kg)     Lab Results  Component Value Date   WBC 7.8 03/23/2020   HGB 14.3 03/23/2020   HCT 43.4 03/23/2020   PLT 137.0 (L) 03/23/2020   GLUCOSE 164 (H) 03/23/2020   CHOL 137 03/23/2020   TRIG 139.0 03/23/2020   HDL 42.40 03/23/2020   LDLDIRECT 71 10/18/2017   LDLCALC 67 03/23/2020   ALT 14 03/23/2020   AST 13 03/23/2020   NA 137 03/23/2020   K 4.6 03/23/2020   CL 103 03/23/2020   CREATININE 1.12 03/23/2020   BUN 20 03/23/2020   CO2 28 03/23/2020   TSH 1.93 03/23/2020   INR 0.87 08/07/2017   HGBA1C 7.4 (H) 03/23/2020   MICROALBUR <0.7 08/13/2019    CT CHEST LUNG CANCER SCREENING LOW DOSE WO CONTRAST  Result Date: 08/26/2019 CLINICAL DATA:  75 year old female with 31 pack-year history of smoking. Lung cancer screening. EXAM: CT CHEST WITHOUT CONTRAST LOW-DOSE FOR LUNG CANCER  SCREENING TECHNIQUE: Multidetector CT imaging of the chest was performed following the standard protocol without IV contrast. COMPARISON:  08/13/2018 FINDINGS: Cardiovascular: The heart size is normal. No substantial pericardial effusion. Coronary artery calcification is evident. Atherosclerotic calcification is noted in the wall of the thoracic aorta. Mediastinum/Nodes: No mediastinal lymphadenopathy. No evidence for gross hilar lymphadenopathy although assessment is limited by the lack of intravenous contrast on today's study. The esophagus has normal imaging features. There is no axillary lymphadenopathy. Lungs/Pleura: Centrilobular emphsyema noted. Scattered tiny pulmonary nodules identified previously are stable. No new suspicious pulmonary nodule or mass. No focal airspace consolidation. No pleural effusion. Upper Abdomen: Stable small cyst upper pole left kidney. Musculoskeletal: No worrisome lytic or sclerotic osseous abnormality. IMPRESSION: 1. Lung-RADS 2, benign appearance or behavior. Continue annual screening with low-dose chest CT without contrast in 12 months. 2. Aortic Atherosclerosis (ICD10-I70.0) and Emphysema (ICD10-J43.9). Electronically Signed   By: Misty Stanley M.D.   On: 08/26/2019 11:13       Assessment & Plan:   Problem List Items Addressed This Visit    Aortic atherosclerosis ([place])    Continue lipitor.        Diabetes mellitus ([place]) - Primary    Per note, off jardiance.  Ozempic.  Discussed low carb diet and exercise.  Weight is down.  Eating.  No nausea or vomiting.  Sugars appear to be doing better.  Follow met b and a1c.  Check today.       Relevant Orders   Hemoglobin A1c (Completed)   GERD (gastroesophageal reflux disease)    Upper symptoms controlled.  On protonix.       Hypercholesterolemia    On lipitor.  Low cholesterol diet and exercise.  Follow lipid panel and liver function tests.        Relevant Orders   Hepatic function panel (Completed)   Lipid  panel (Completed)   Hypertension    Blood pressure doing better.  Continue lisinopril.  Follow pressures.  Follow metabolic panel.       Relevant Orders   CBC with Differential/Platelet (Completed)   Basic metabolic panel (Completed)   Hypothyroidism    On thyroid replacement.  Follow tsh.       Relevant Orders   TSH (Completed)   Personal history of tobacco use, presenting hazards to health    Has declined to quit smoking.  Follow.       Skin lesion    Triamcinolone cream.  Notify me if persistent.       Thrombocytopenia ([facility])    Platelet count has been slightly decreased.  Recheck today.            Einar Pheasant, MD

## 2020-03-24 LAB — CBC WITH DIFFERENTIAL/PLATELET
Basophils Absolute: 0.1 10*3/uL (ref 0.0–0.1)
Basophils Relative: 1.2 % (ref 0.0–3.0)
Eosinophils Absolute: 0.2 10*3/uL (ref 0.0–0.7)
Eosinophils Relative: 2.7 % (ref 0.0–5.0)
HCT: 43.4 % (ref 36.0–46.0)
Hemoglobin: 14.3 g/dL (ref 12.0–15.0)
Lymphocytes Relative: 51 % — ABNORMAL HIGH (ref 12.0–46.0)
Lymphs Abs: 4 10*3/uL (ref 0.7–4.0)
MCHC: 33 g/dL (ref 30.0–36.0)
MCV: 88.3 fl (ref 78.0–100.0)
Monocytes Absolute: 0.4 10*3/uL (ref 0.1–1.0)
Monocytes Relative: 5.3 % (ref 3.0–12.0)
Neutro Abs: 3.1 10*3/uL (ref 1.4–7.7)
Neutrophils Relative %: 39.8 % — ABNORMAL LOW (ref 43.0–77.0)
Platelets: 137 10*3/uL — ABNORMAL LOW (ref 150.0–400.0)
RBC: 4.91 Mil/uL (ref 3.87–5.11)
RDW: 15.3 % (ref 11.5–15.5)
WBC: 7.8 10*3/uL (ref 4.0–10.5)

## 2020-03-24 LAB — BASIC METABOLIC PANEL
BUN: 20 mg/dL (ref 6–23)
CO2: 28 mEq/L (ref 19–32)
Calcium: 9.9 mg/dL (ref 8.4–10.5)
Chloride: 103 mEq/L (ref 96–112)
Creatinine, Ser: 1.12 mg/dL (ref 0.40–1.20)
GFR: 48.5 mL/min — ABNORMAL LOW (ref >60.00)
Glucose, Bld: 164 mg/dL — ABNORMAL HIGH (ref 70–99)
Potassium: 4.6 mEq/L (ref 3.5–5.1)
Sodium: 137 mEq/L (ref 135–145)

## 2020-03-24 LAB — HEPATIC FUNCTION PANEL
ALT: 14 U/L (ref 0–35)
AST: 13 U/L (ref 0–37)
Albumin: 4.2 g/dL (ref 3.5–5.2)
Alkaline Phosphatase: 91 U/L (ref 39–117)
Bilirubin, Direct: 0.1 mg/dL (ref 0.0–0.3)
Total Bilirubin: 0.6 mg/dL (ref 0.2–1.2)
Total Protein: 6.9 g/dL (ref 6.0–8.3)

## 2020-03-24 LAB — TSH: TSH: 1.93 u[IU]/mL (ref 0.35–4.50)

## 2020-03-24 LAB — LIPID PANEL
Cholesterol: 137 mg/dL (ref 0–200)
HDL: 42.4 mg/dL (ref >39.00)
LDL Cholesterol: 67 mg/dL (ref 0–99)
NonHDL: 94.81
Total CHOL/HDL Ratio: 3
Triglycerides: 139 mg/dL (ref 0.0–149.0)
VLDL: 27.8 mg/dL (ref 0.0–40.0)

## 2020-03-24 LAB — HEMOGLOBIN A1C: Hgb A1c MFr Bld: 7.4 % — ABNORMAL HIGH (ref 4.6–6.5)

## 2020-04-05 ENCOUNTER — Encounter: Payer: Self-pay | Admitting: Internal Medicine

## 2020-04-05 DIAGNOSIS — L989 Disorder of the skin and subcutaneous tissue, unspecified: Secondary | ICD-10-CM | POA: Insufficient documentation

## 2020-04-05 NOTE — Assessment & Plan Note (Signed)
On lipitor.  Low cholesterol diet and exercise.  Follow lipid panel and liver function tests.   

## 2020-04-05 NOTE — Assessment & Plan Note (Signed)
On thyroid replacement.  Follow tsh.  

## 2020-04-05 NOTE — Assessment & Plan Note (Addendum)
Per note, off jardiance.  Ozempic.  Discussed low carb diet and exercise.  Weight is down.  Eating.  No nausea or vomiting.  Sugars appear to be doing better.  Follow met b and a1c.  Check today.

## 2020-04-05 NOTE — Assessment & Plan Note (Signed)
Blood pressure doing better.  Continue lisinopril.  Follow pressures.  Follow metabolic panel.

## 2020-04-05 NOTE — Assessment & Plan Note (Signed)
Platelet count has been slightly decreased.  Recheck today.

## 2020-04-05 NOTE — Assessment & Plan Note (Signed)
Triamcinolone cream.  Notify me if persistent.

## 2020-04-05 NOTE — Assessment & Plan Note (Signed)
Upper symptoms controlled.  On protonix.  

## 2020-04-05 NOTE — Assessment & Plan Note (Signed)
Has declined to quit smoking.  Follow.

## 2020-04-05 NOTE — Assessment & Plan Note (Signed)
Continue lipitor  ?

## 2020-04-23 ENCOUNTER — Ambulatory Visit (INDEPENDENT_AMBULATORY_CARE_PROVIDER_SITE_OTHER): Payer: Medicare Other | Admitting: Pharmacist

## 2020-04-23 DIAGNOSIS — E039 Hypothyroidism, unspecified: Secondary | ICD-10-CM | POA: Diagnosis not present

## 2020-04-23 DIAGNOSIS — I700 Atherosclerosis of aorta: Secondary | ICD-10-CM

## 2020-04-23 DIAGNOSIS — I10 Essential (primary) hypertension: Secondary | ICD-10-CM

## 2020-04-23 DIAGNOSIS — E1165 Type 2 diabetes mellitus with hyperglycemia: Secondary | ICD-10-CM | POA: Diagnosis not present

## 2020-04-23 DIAGNOSIS — E7800 Pure hypercholesterolemia, unspecified: Secondary | ICD-10-CM | POA: Diagnosis not present

## 2020-04-23 MED ORDER — BLOOD GLUCOSE METER KIT: PACK | 3 refills | Status: DC

## 2020-04-23 NOTE — Patient Instructions (Signed)
Visit Information  PATIENT GOALS: Goals Addressed              This Visit's Progress     Patient Stated   .  Medication Monitoring (pt-stated)        Patient Goals/Self-Care Activities . Over the next 90 days, patient will:  - take medications as prescribed check glucose daily, alternating between fasting and post prandial, document, and provide at future appointments collaborate with provider on medication access solutions         Patient verbalizes understanding of instructions provided today and agrees to view in [place].   Plan: Telephone follow up appointment with care management team member scheduled for:  ~ 8 weeks  [name], PharmD, [place], [facility] Clinical Pharmacist [employer] [facility] [employer] [phone]

## 2020-04-23 NOTE — Chronic Care Management (AMB) (Signed)
**Note Ann-Identified via Obfuscation** Chronic Care Management Pharmacy Note  04/23/2020 Name:  [name] MRN:  [id number] DOB:  [birth year]  Subjective: Ann Collins is an 75 y.o. year old female who is a primary patient of Ann Pheasant, MD.  The CCM team was consulted for assistance with disease management and care coordination needs.    Engaged with patient by telephone for follow up visit in response to provider referral for pharmacy case management and/or care coordination services.   Consent to Services:  The patient was given information about Chronic Care Management services, agreed to services, and gave verbal consent prior to initiation of services.  Please see initial visit note for detailed documentation.   Patient Care Team: Ann Pheasant, MD as PCP - General (Internal Medicine) Ann Hampshire, MD as PCP - Cardiology (Cardiology) Ann Collins, RPH-CPP as Pharmacist (Pharmacist)  Recent office visits:  2/14 PCP - chronic condition f/u; A1c 7.4%, LDL 67, eGFR 48  Recent consult visits:  2/7, 2/28, 3/16 [facility] visits: None in previous 6 months  Objective:  Lab Results  Component Value Date   CREATININE 1.12 03/23/2020   BUN 20 03/23/2020   GFR 48.50 (L) 03/23/2020   GFRNONAA 54 (L) 08/22/2017   GFRAA 62 08/22/2017   NA 137 03/23/2020   K 4.6 03/23/2020   CALCIUM 9.9 03/23/2020   CO2 28 03/23/2020    Lab Results  Component Value Date/Time   HGBA1C 7.4 (H) 03/23/2020 03:59 PM   HGBA1C 7.9 (H) 12/16/2019 09:43 AM   HGBA1C 7.4 12/06/2016 12:00 AM   GFR 48.50 (L) 03/23/2020 03:59 PM   GFR 46.58 (L) 12/16/2019 09:43 AM   MICROALBUR <0.7 08/13/2019 10:15 AM   MICROALBUR <0.7 07/12/2018 08:18 AM    Last diabetic Eye exam:  Lab Results  Component Value Date/Time   HMDIABEYEEXA No Retinopathy 12/13/2016 12:00 AM    Last diabetic Foot exam:  Lab Results  Component Value Date/Time   HMDIABFOOTEX on my exam 08/13/2019 12:00 AM     Lab Results   Component Value Date   CHOL 137 03/23/2020   HDL 42.40 03/23/2020   LDLCALC 67 03/23/2020   LDLDIRECT 71 10/18/2017   TRIG 139.0 03/23/2020   CHOLHDL 3 03/23/2020    Hepatic Function Latest Ref Rng & Units 03/23/2020 12/16/2019 08/13/2019  Total Protein 6.0 - 8.3 g/dL 6.9 6.6 6.8  Albumin 3.5 - 5.2 g/dL 4.2 4.2 4.3  AST 0 - 37 U/L 13 14 16   ALT 0 - 35 U/L 14 16 16   Alk Phosphatase 39 - 117 U/L 91 95 96  Total Bilirubin 0.2 - 1.2 mg/dL 0.6 0.5 0.6  Bilirubin, Direct 0.0 - 0.3 mg/dL 0.1 0.1 0.1    Lab Results  Component Value Date/Time   TSH 1.93 03/23/2020 03:59 PM   TSH 3.75 08/13/2019 10:15 AM    CBC Latest Ref Rng & Units 03/23/2020 12/16/2019 08/13/2019  WBC 4.0 - 10.5 K/uL 7.8 7.8 9.2  Hemoglobin 12.0 - 15.0 g/dL 14.3 14.6 14.6  Hematocrit 36.0 - 46.0 % 43.4 44.8 44.2  Platelets 150.0 - 400.0 K/uL 137.0(L) 125.0(L) 119.0(L)    No results found for: VD25OH  Clinical ASCVD: No  The 10-year ASCVD risk score Ann Collins DC Jr., et al., 2013) is: 53.5%   Values used to calculate the score:     Age: 51 years     Sex: Female     Is Non-Hispanic African American: No     Diabetic:  Yes     Tobacco smoker: Yes     Systolic Blood Pressure: 421 mmHg     Is BP treated: Yes     HDL Cholesterol: 42.4 mg/dL     Total Cholesterol: 137 mg/dL    Depression screen [facility] 2/9 05/31/2019 05/30/2018 05/12/2017  Decreased Interest 0 0 0  Down, Depressed, Hopeless 1 0 0  PHQ - 2 Score 1 0 0  Altered sleeping - - -  Tired, decreased energy - - -  Change in appetite - - -  Feeling bad or failure about yourself  - - -  Trouble concentrating - - -  Moving slowly or fidgety/restless - - -  Suicidal thoughts - - -  PHQ-9 Score - - -      Social History   Tobacco Use  Smoking Status Current Every Day Smoker  . Packs/day: 1.00  . Years: 50.00  . Pack years: 50.00  Smokeless Tobacco Never Used   BP Readings from Last 3 Encounters:  04/05/20 132/68  12/16/19 128/70  08/13/19 122/70   Pulse  Readings from Last 3 Encounters:  03/23/20 70  12/16/19 67  08/13/19 70   Wt Readings from Last 3 Encounters:  03/23/20 134 lb 12.8 oz (61.1 kg)  12/16/19 143 lb 6.4 oz (65 kg)  08/26/19 145 lb (65.8 kg)    Assessment/Interventions: Review of patient past medical history, allergies, medications, health status, including review of consultants reports, laboratory and other test data, was performed as part of comprehensive evaluation and provision of chronic care management services.   SDOH:  (Social Determinants of Health) assessments and interventions performed: Yes SDOH Interventions   Flowsheet Row Most Recent Value  SDOH Interventions   Financial Strain Interventions Other (Comment)  [manufacturer assistance]  Stress Interventions Other (Comment)  [offered LCSW referral, declined at this time]      CCM Care Plan  Allergies  Allergen Reactions  . Pioglitazone Swelling  . Macrobid [Nitrofurantoin Macrocrystal] Other (See Comments)    Body aches  . Nitrofurantoin Other (See Comments)    Body aches    Medications Reviewed Today    Reviewed by Ann Collins, RPH-CPP (Pharmacist) on 04/23/20 at 0905  Med List Status: <None>  Medication Order Taking? Sig Documenting Provider Last Dose Status Informant  aspirin 81 MG tablet [id number] Yes Take 81 mg by mouth daily. [provider] Taking Active   atorvastatin (LIPITOR) 40 MG tablet [id number] Yes Take 1 tablet (40 mg total) by mouth daily. Ann Pheasant, MD Taking Active   carvedilol (COREG) 3.125 MG tablet [id number] Yes Take 1 tablet (3.125 mg total) by mouth 2 (two) times daily. Ann Hampshire, MD Taking Active   cyanocobalamin 1000 MCG tablet [id number] No Take 1,000 mcg by mouth as needed. Takes occasionally  Patient not taking: Reported on 04/23/2020   [provider] Not Taking Active   dapagliflozin propanediol (FARXIGA) 10 MG TABS tablet [id number] Yes Take 10 mg by mouth in the morning. [provider] Taking Active   Glucosamine 500 MG CAPS [id number] No Take 1 capsule by mouth as needed.  Patient not taking: Reported on 04/23/2020   [provider] Not Taking Active            Med Note Ann Collins   Thu Sep 20, 2018 11:05 AM) Taking for wrist pain  glucose blood test strip [id number] Yes Use as instructed to blood sugar twice daily Ann Pheasant, MD Taking Active   Insulin Pen  Needle 32G X 6 MM MISC [id number] Yes To fit TRESIBA PEN Crecencio Mc, MD Taking Active   levothyroxine (SYNTHROID) 100 MCG tablet [id number] Yes TAKE 1 TABLET(100 MCG) BY MOUTH DAILY Ann Pheasant, MD Taking Active   lisinopril (ZESTRIL) 20 MG tablet [id number] Yes Take 1 tablet (20 mg total) by mouth daily. Ann Pheasant, MD Taking Active   pantoprazole (PROTONIX) 40 MG tablet [id number] Yes TAKE 1 TABLET BY MOUTH DAILY AS NEEDED Ann Pheasant, MD Taking Active   Semaglutide,0.25 or 0.5MG/DOS, (OZEMPIC, 0.25 OR 0.5 MG/DOSE,) 2 MG/1.5ML SOPN [id number] Yes Inject 0.25 mg once weekly x 4 weeks then increase to 0.5 mg weekly Ann Pheasant, MD Taking Active            Med Note Ann Collins   Mon Feb 03, 2020 11:22 AM) 0.5 mg  triamcinolone (KENALOG) 0.1 % [id number] Yes Apply 1 application topically 2 (two) times daily. Ann Pheasant, MD Taking Active           Patient Active Problem List   Diagnosis Date Noted  . Skin lesion 04/05/2020  . Adrenal adenoma 08/19/2019  . Breast cancer screening 08/19/2019  . Thrombocytopenia ([place]) 01/27/2019  . Left wrist fracture 04/27/2018  . Cough 04/27/2018  . Abnormal EKG   . Chest pain 07/10/2017  . Renal lesion 04/10/2016  . Aortic atherosclerosis ([place]) 06/30/2015  . Personal history of tobacco use, presenting hazards to health 06/25/2015  . Left carotid bruit 12/28/2014  . Health care maintenance 07/29/2014  . Diabetes mellitus ([place]) 03/10/2012  . Hypothyroidism 03/10/2012  . Hypercholesterolemia 03/10/2012  .  Hypertension 03/10/2012  . GERD (gastroesophageal reflux disease) 03/10/2012    Immunization History  Administered Date(s) Administered  . Fluad Quad(high Dose 65+) 11/11/2019  . Influenza Split 10/16/2013  . Influenza Whole 11/24/2016  . Influenza, High Dose Seasonal PF 11/05/2017, 10/18/2018  . Influenza,inj,quad, With Preservative 11/07/2017  . Influenza-Unspecified 11/07/2012, 10/27/2014  . Moderna Sars-Covid-2 Vaccination 03/09/2019, 04/07/2019, 01/30/2020  . Pneumococcal Conjugate-13 03/02/2016  . Pneumococcal Polysaccharide-23 12/08/2012, 03/02/2017  . Tdap 10/14/2013    Conditions to be addressed/monitored:  Hypertension, Hyperlipidemia, Diabetes and Chronic Kidney Disease  Care Plan : Medication Management  Updates made by Ann Collins, RPH-CPP since 04/23/2020 12:00 AM    Problem: Diabetes, HLD, CKD     Long-Range Goal: Disease Progression Prevention   This Visit's Progress: On track  Recent Progress: On track  Priority: High  Note:   Current Barriers:  . Unable to independently afford treatment regimen . Unable to independently monitor therapeutic efficacy . Unable to maintain control of diabetes  Pharmacist Clinical Goal(s):  Marland Kitchen Over the next 90 days, patient will verbalize ability to afford treatment regimen . Over the next 90 days, patient will achieve control of diabetes as evidenced by A1c through collaboration with PharmD and provider.   Interventions: . 1:1 collaboration with Ann Pheasant, MD regarding development and update of comprehensive plan of care as evidenced by provider attestation and co-signature . Inter-disciplinary care team collaboration (see longitudinal plan of care) . Comprehensive medication review performed; medication list updated in electronic medical record  Medication Management: . Previously provided BID pill box to patient. Notes that she tried to use, but didn't like taking a handful of meds at once. Endorses 100%  compliance (notes she'll sometimes take meds later, but will take them)  SDOH: . Endorses stress. Daughter with mental health issues and works in [employer], academic. Caring for grandson. Notes that she "refuses to break".  Offered LCSW referral, patient declines at this time, but agrees to let me know if she changes her mind in the future . Extensive dental work lately. Causing stress.   Diabetes: . Uncontrolled; current treatment: Farxiga 10 mg daily, Ozempic 0.5 mg weekly o Hx metformin- stopped after a 2019 hospitalization, reason for discontinuation unclear  o Hx Tresiba - discontinued when no longer needed . Current glucose readings: patient reports occasional fastings 160s, post meal ~ 181. Slight increase in sugar readings since changing from [place] to [place]. Current meter - Member's [employer], has been using for several years.  . Current meal patterns: breakfast: piece of toast, egg, sometimes biscuit if out and about, sometimes oatmeal, drink: diet pepsi or diet coke; has tried flavored waters but doesn't like; lunch: smaller meal, doesn't eat a lot, sometimes banana, sometimes hot dog; supper: variable, grandson likes [employer] -> small burger or chicken nuggets; chickfila; once a week gets a steak and salad . Reviewed goal A1c, goal fasting, and goal 2 hour post prandial glucose.  Marland Kitchen Discussed impact of stress on glucose readings . Discussed reduction in sodas for long term health benefits. . Extensive dietary discussion. Recommended incorporation of more fruits, vegetables, lean proteins.  . Discussed increasing Ozempic. Patient would like to continue current regimen at this time and follow glucose.  . Ordered new glucometer + test strips + lancets to ensure accuracy.   Hypertension: . Controlled per last clinic reading; current treatment: losartan 25 mg daily, carvedilol 3.125 mg BID  . Fill history up to date . Recommended to continue current regimen. Will discuss occasional  home checks at future appointments.   Hyperlipidemia and ASCVD risk reduction: . Controlled per last lipid panel; current treatment: atorvastatin 40 mg daily . Current antiplatelet regimen: aspirin 81 mg daily, though notes she sometimes forgets  . Fill history up to date for statin . Recommended to continue current regimen.  Hypothyroidism: . Controlled per last lab work; current regimen: levothyroxine 100 mcg daily . Confirmed appropriate administration  . Continue current regimen at this time  Tobacco Abuse: . 1 packs per day; denies being in a place to be able to work on tobacco cessation d/t stressors in her life.  . Will continue to evaluate readiness to quit and remind of health benefits and benefits for those around her.   Acid Reflux: . Controlled per patient report; current regimen: pantoprazole 40 mg PRN . Continue current regimen at this time. Will discuss ability for trial of H2RA instead in the future.   Supplements: . Reports current use of glucosamine PRN wrist pain w/ benefit; vitamin B12 1000 units daily . No interactions noted. Continue current regimen at this time  Patient Goals/Self-Care Activities . Over the next 90 days, patient will:  - take medications as prescribed check glucose daily, alternating between fasting and post prandial, document, and provide at future appointments collaborate with provider on medication access solutions  Follow Up Plan: Telephone follow up appointment with care management team member scheduled for: ~ 8 weeks       Medication Assistance: Ozempic obtained through [employer] medication assistance program.  Enrollment ends 01/06/21. [name] obtained through [employer] patient assistance program. Enrollment ends 02/06/21  Patient's preferred pharmacy is:  [facility] [place], [place] - [address] [facility] [place] [address] Phone: [phone] Fax: [phone]   Care Plan  and Follow Up Patient Decision:  Patient agrees to Care Plan and  Follow-up.  Plan: Telephone follow up appointment with care management team member scheduled for:  ~ 8 weeks  [name], PharmD, [place], [place] Clinical Pharmacist [employer] at [employer] [phone]

## 2020-05-20 ENCOUNTER — Telehealth: Payer: Self-pay

## 2020-05-20 NOTE — Telephone Encounter (Signed)
Ozempic and [place] received. Pt aware. Medication has been labeled and placed in refrigerator for pick up. Ann Collins is no longer on med list and patient has not been taking. per [name], Ann Collins will not be given to patient and has been placed in sample refrigerator. Patient is aware of this.  [name]- while on the phone, patient noted she has not received her [place]

## 2020-05-20 NOTE — Telephone Encounter (Signed)
Ann Collins - I recommend patient call [place] patient assistance to follow up on Farxiga shipment - [phone]. She had told my technician, Sharee Pimple, on 03/25/20 that she had received the [place] shipment, but planned on continuing her [place] supply until she finished that, then starting [place].

## 2020-05-21 NOTE — Telephone Encounter (Signed)
Patient aware and stated she has about 20 left right now. Also patient assistance meds have been picked up

## 2020-06-02 ENCOUNTER — Other Ambulatory Visit: Payer: Self-pay

## 2020-06-02 ENCOUNTER — Ambulatory Visit (INDEPENDENT_AMBULATORY_CARE_PROVIDER_SITE_OTHER): Payer: Medicare Other | Admitting: Internal Medicine

## 2020-06-02 ENCOUNTER — Ambulatory Visit (INDEPENDENT_AMBULATORY_CARE_PROVIDER_SITE_OTHER): Payer: Medicare Other

## 2020-06-02 VITALS — BP 126/72 | Ht 62.0 in | Wt 128.0 lb

## 2020-06-02 VITALS — BP 126/72 | HR 62 | Temp 96.1°F | Resp 16 | Ht 62.0 in | Wt 128.2 lb

## 2020-06-02 DIAGNOSIS — Z1231 Encounter for screening mammogram for malignant neoplasm of breast: Secondary | ICD-10-CM | POA: Diagnosis not present

## 2020-06-02 DIAGNOSIS — E1165 Type 2 diabetes mellitus with hyperglycemia: Secondary | ICD-10-CM

## 2020-06-02 DIAGNOSIS — E2839 Other primary ovarian failure: Secondary | ICD-10-CM | POA: Diagnosis not present

## 2020-06-02 DIAGNOSIS — I700 Atherosclerosis of aorta: Secondary | ICD-10-CM

## 2020-06-02 DIAGNOSIS — D696 Thrombocytopenia, unspecified: Secondary | ICD-10-CM

## 2020-06-02 DIAGNOSIS — K2100 Gastro-esophageal reflux disease with esophagitis, without bleeding: Secondary | ICD-10-CM

## 2020-06-02 DIAGNOSIS — I10 Essential (primary) hypertension: Secondary | ICD-10-CM | POA: Diagnosis not present

## 2020-06-02 DIAGNOSIS — E039 Hypothyroidism, unspecified: Secondary | ICD-10-CM | POA: Diagnosis not present

## 2020-06-02 DIAGNOSIS — Z87891 Personal history of nicotine dependence: Secondary | ICD-10-CM | POA: Diagnosis not present

## 2020-06-02 DIAGNOSIS — Z0000 Encounter for general adult medical examination without abnormal findings: Secondary | ICD-10-CM

## 2020-06-02 DIAGNOSIS — E7800 Pure hypercholesterolemia, unspecified: Secondary | ICD-10-CM

## 2020-06-02 MED ORDER — PANTOPRAZOLE SODIUM 40 MG PO TBEC: 1.0000 | DELAYED_RELEASE_TABLET | Freq: Every day | ORAL | 1 refills | Status: DC | PRN

## 2020-06-02 MED ORDER — LEVOTHYROXINE SODIUM 100 MCG PO TABS: ORAL_TABLET | ORAL | 1 refills | Status: DC

## 2020-06-02 MED ORDER — CARVEDILOL 3.125 MG PO TABS: 3.1250 mg | ORAL_TABLET | Freq: Two times a day (BID) | ORAL | 1 refills | Status: DC

## 2020-06-02 NOTE — Patient Instructions (Addendum)
Ann Collins , Thank you for taking time to come for your Medicare Wellness Visit. I appreciate your ongoing commitment to your health goals. Please review the following plan we discussed and let me know if I can assist you in the future.   These are the goals we discussed: Goals      Patient Stated   .  Medication Monitoring (pt-stated)      Patient Goals/Self-Care Activities . Over the next 90 days, patient will:  - take medications as prescribed check glucose daily, alternating between fasting and post prandial, document, and provide at future appointments collaborate with provider on medication access solutions         This is a list of the screening recommended for you and due dates:  Health Maintenance  Topic Date Due  . DEXA scan (bone density measurement)  Never done  . Eye exam for diabetics  12/13/2017  . Mammogram  09/10/2019  . Cologuard (Stool DNA test)  06/14/2020  . COVID-19 Vaccine (4 - Booster for Moderna series) 07/30/2020  . Complete foot exam   08/12/2020  . Flu Shot  09/07/2020  . Hemoglobin A1C  09/20/2020  . Tetanus Vaccine  10/15/2023  .  Hepatitis C: One time screening is recommended by Center for Disease Control  (CDC) for  adults born from [birth year] through 1965.   Completed  . Pneumonia vaccines  Completed  . HPV Vaccine  Aged Out   Immunizations Immunization History  Administered Date(s) Administered  . Fluad Quad(high Dose 65+) 11/11/2019  . Influenza Split 10/16/2013  . Influenza Whole 11/24/2016  . Influenza, High Dose Seasonal PF 11/05/2017, 10/18/2018  . Influenza,inj,quad, With Preservative 11/07/2017  . Influenza-Unspecified 11/07/2012, 10/27/2014  . Moderna Sars-Covid-2 Vaccination 03/09/2019, 04/07/2019, 01/30/2020  . Pneumococcal Conjugate-13 03/02/2016  . Pneumococcal Polysaccharide-23 12/08/2012, 03/02/2017  . Tdap 10/14/2013   Keep all routine maintenance appointments.   CCM 07/01/20 @ 9:00  Follow up 07/28/20 @  7:30  Advanced directives: not yet completed.   Conditions/risks identified: none new.   Follow up in one year for your annual wellness visit.   Preventive Care 38 Years and Older, Female Preventive care refers to lifestyle choices and visits with your health care provider that can promote health and wellness. What does preventive care include?  A yearly physical exam. This is also called an annual well check.  Dental exams once or twice a year.  Routine eye exams. Ask your health care provider how often you should have your eyes checked.  Personal lifestyle choices, including:  Daily care of your teeth and gums.  Regular physical activity.  Eating a healthy diet.  Avoiding tobacco and drug use.  Limiting alcohol use.  Practicing safe sex.  Taking low-dose aspirin every day.  Taking vitamin and mineral supplements as recommended by your health care provider. What happens during an annual well check? The services and screenings done by your health care provider during your annual well check will depend on your age, overall health, lifestyle risk factors, and family history of disease. Counseling  Your health care provider may ask you questions about your:  Alcohol use.  Tobacco use.  Drug use.  Emotional well-being.  Home and relationship well-being.  Sexual activity.  Eating habits.  History of falls.  Memory and ability to understand (cognition).  Work and work Statistician.  Reproductive health. Screening  You may have the following tests or measurements:  Height, weight, and BMI.  Blood pressure.  Lipid and cholesterol levels.  These may be checked every 5 years, or more frequently if you are over 35 years old.  Skin check.  Lung cancer screening. You may have this screening every year starting at age 36 if you have a 30-pack-year history of smoking and currently smoke or have quit within the past 15 years.  Fecal occult blood test (FOBT) of  the stool. You may have this test every year starting at age 62.  Flexible sigmoidoscopy or colonoscopy. You may have a sigmoidoscopy every 5 years or a colonoscopy every 10 years starting at age 40.  Hepatitis C blood test.  Hepatitis B blood test.  Sexually transmitted disease (STD) testing.  Diabetes screening. This is done by checking your blood sugar (glucose) after you have not eaten for a while (fasting). You may have this done every 1-3 years.  Bone density scan. This is done to screen for osteoporosis. You may have this done starting at age 65.  Mammogram. This may be done every 1-2 years. Talk to your health care provider about how often you should have regular mammograms. Talk with your health care provider about your test results, treatment options, and if necessary, the need for more tests. Vaccines  Your health care provider may recommend certain vaccines, such as:  Influenza vaccine. This is recommended every year.  Tetanus, diphtheria, and acellular pertussis (Tdap, Td) vaccine. You may need a Td booster every 10 years.  Zoster vaccine. You may need this after age 24.  Pneumococcal 13-valent conjugate (PCV13) vaccine. One dose is recommended after age 14.  Pneumococcal polysaccharide (PPSV23) vaccine. One dose is recommended after age 70. Talk to your health care provider about which screenings and vaccines you need and how often you need them. This information is not intended to replace advice given to you by your health care provider. Make sure you discuss any questions you have with your health care provider. Document Released: 02/20/2015 Document Revised: 10/14/2015 Document Reviewed: 11/25/2014 Elsevier Interactive Patient Education  2017 [place] Prevention in the Home Falls can cause injuries. They can happen to people of all ages. There are many things you can do to make your home safe and to help prevent falls. What can I do on the outside of my  home?  Regularly fix the edges of walkways and driveways and fix any cracks.  Remove anything that might make you trip as you walk through a door, such as a raised step or threshold.  Trim any bushes or trees on the path to your home.  Use bright outdoor lighting.  Clear any walking paths of anything that might make someone trip, such as rocks or tools.  Regularly check to see if handrails are loose or broken. Make sure that both sides of any steps have handrails.  Any raised decks and porches should have guardrails on the edges.  Have any leaves, snow, or ice cleared regularly.  Use sand or salt on walking paths during winter.  Clean up any spills in your garage right away. This includes oil or grease spills. What can I do in the bathroom?  Use night lights.  Install grab bars by the toilet and in the tub and shower. Do not use towel bars as grab bars.  Use non-skid mats or decals in the tub or shower.  If you need to sit down in the shower, use a plastic, non-slip stool.  Keep the floor dry. Clean up any water that spills on the floor as soon  as it happens.  Remove soap buildup in the tub or shower regularly.  Attach bath mats securely with double-sided non-slip rug tape.  Do not have throw rugs and other things on the floor that can make you trip. What can I do in the bedroom?  Use night lights.  Make sure that you have a light by your bed that is easy to reach.  Do not use any sheets or blankets that are too big for your bed. They should not hang down onto the floor.  Have a firm chair that has side arms. You can use this for support while you get dressed.  Do not have throw rugs and other things on the floor that can make you trip. What can I do in the kitchen?  Clean up any spills right away.  Avoid walking on wet floors.  Keep items that you use a lot in easy-to-reach places.  If you need to reach something above you, use a strong step stool that has a  grab bar.  Keep electrical cords out of the way.  Do not use floor polish or wax that makes floors slippery. If you must use wax, use non-skid floor wax.  Do not have throw rugs and other things on the floor that can make you trip. What can I do with my stairs?  Do not leave any items on the stairs.  Make sure that there are handrails on both sides of the stairs and use them. Fix handrails that are broken or loose. Make sure that handrails are as long as the stairways.  Check any carpeting to make sure that it is firmly attached to the stairs. Fix any carpet that is loose or worn.  Avoid having throw rugs at the top or bottom of the stairs. If you do have throw rugs, attach them to the floor with carpet tape.  Make sure that you have a light switch at the top of the stairs and the bottom of the stairs. If you do not have them, ask someone to add them for you. What else can I do to help prevent falls?  Wear shoes that:  Do not have high heels.  Have rubber bottoms.  Are comfortable and fit you well.  Are closed at the toe. Do not wear sandals.  If you use a stepladder:  Make sure that it is fully opened. Do not climb a closed stepladder.  Make sure that both sides of the stepladder are locked into place.  Ask someone to hold it for you, if possible.  Clearly mark and make sure that you can see:  Any grab bars or handrails.  First and last steps.  Where the edge of each step is.  Use tools that help you move around (mobility aids) if they are needed. These include:  Canes.  Walkers.  Scooters.  Crutches.  Turn on the lights when you go into a dark area. Replace any light bulbs as soon as they burn out.  Set up your furniture so you have a clear path. Avoid moving your furniture around.  If any of your floors are uneven, fix them.  If there are any pets around you, be aware of where they are.  Review your medicines with your doctor. Some medicines can make  you feel dizzy. This can increase your chance of falling. Ask your doctor what other things that you can do to help prevent falls. This information is not intended to replace advice given to  you by your health care provider. Make sure you discuss any questions you have with your health care provider. Document Released: 11/20/2008 Document Revised: 07/02/2015 Document Reviewed: 02/28/2014 Elsevier Interactive Patient Education  2017 [employer].

## 2020-06-02 NOTE — Progress Notes (Signed)
Patient ID: Ann Collins, female   DOB: [birth year], 75 y.o.   MRN: [id number]   Subjective:    Patient ID: Ann Collins, female    DOB: [birth year], 75 y.o.   MRN: [id number]  HPI This visit occurred during the SARS-CoV-2 public health emergency.  Safety protocols were in place, including screening questions prior to the visit, additional usage of staff PPE, and extensive cleaning of exam room while observing appropriate contact time as indicated for disinfecting solutions.  Patient here for a scheduled follow up.  Here to follow up regarding her diabetes, cholesterol and blood pressure.  She is continuing to lose weight.  On ozempic.  States she is eating.  Gets hungry.  No nausea or vomiting.  No abdominal pain.  Bowels moving.  Bowels doing better.  Feels better from a GI standpoint.  No chest pain. Breathing stable.   Past Medical History:  Diagnosis Date  . Abdominal hernia   . CAD (coronary artery disease)    a. LHC 7/19: LM nl, p-mLAD 100% w/ right to left collaterals, OM2 90% (2 mm), 1st LPL 40%, medical management  . Diabetes mellitus ([place])   . Diverticulosis   . GERD (gastroesophageal reflux disease)   . Hematuria   . Hypercholesterolemia   . Hypertension   . Hypothyroidism   . Kidney stones    Past Surgical History:  Procedure Laterality Date  . APPENDECTOMY    . BREAST BIOPSY  1969   right  . CHOLECYSTECTOMY  2005  . COLONOSCOPY    . COLONOSCOPY WITH PROPOFOL N/A 09/19/2017   Procedure: COLONOSCOPY WITH PROPOFOL;  Surgeon: Toledo, Benay Pike, MD;  Location: [facility] ENDOSCOPY;  Service: Gastroenterology;  Laterality: N/A;  . ESOPHAGOGASTRODUODENOSCOPY (EGD) WITH PROPOFOL N/A 09/19/2017   Procedure: ESOPHAGOGASTRODUODENOSCOPY (EGD) WITH PROPOFOL;  Surgeon: Toledo, Benay Pike, MD;  Location: [facility] ENDOSCOPY;  Service: Gastroenterology;  Laterality: N/A;  . LEFT HEART CATH AND CORONARY ANGIOGRAPHY Left 08/07/2017   Procedure: LEFT HEART CATH AND CORONARY ANGIOGRAPHY;  Surgeon:  Wellington Hampshire, MD;  Location: [facility];  Service: Cardiovascular;  Laterality: Left;  . UPPER GI ENDOSCOPY     Family History  Problem Relation Age of Onset  . Brain cancer Mother        history of brain tumor  . Lung disease Father   . Diabetes Brother        x2  . Bipolar disorder Son   . Alcohol abuse Son   . Breast cancer Neg Hx   . Colon cancer Neg Hx    Social History   Socioeconomic History  . Marital status: Married    Spouse name: Not on file  . Number of children: Not on file  . Years of education: Not on file  . Highest education level: Not on file  Occupational History  . Not on file  Tobacco Use  . Smoking status: Current Every Day Smoker    Packs/day: 1.00    Years: 50.00    Pack years: 50.00  . Smokeless tobacco: Never Used  Vaping Use  . Vaping Use: Never used  Substance and Sexual Activity  . Alcohol use: No    Alcohol/week: 0.0 standard drinks  . Drug use: No  . Sexual activity: Not on file  Other Topics Concern  . Not on file  Social History Narrative  . Not on file   Social Determinants of Health   Financial Resource Strain: Medium Risk  . Difficulty of Paying  Living Expenses: Somewhat hard  Food Insecurity: No Food Insecurity  . Worried About Charity fundraiser in the Last Year: Never true  . Ran Out of Food in the Last Year: Never true  Transportation Needs: No Transportation Needs  . Lack of Transportation (Medical): No  . Lack of Transportation (Non-Medical): No  Physical Activity: Insufficiently Active  . Days of Exercise per Week: 3 days  . Minutes of Exercise per Session: 30 min  Stress: Stress Concern Present  . Feeling of Stress : Rather much  Social Connections: Unknown  . Frequency of Communication with Friends and Family: Not on file  . Frequency of Social Gatherings with Friends and Family: Not on file  . Attends Religious Services: Not on file  . Active Member of Clubs or Organizations: Not on file  .  Attends Archivist Meetings: Not on file  . Marital Status: Married    Outpatient Encounter Medications as of 06/02/2020  Medication Sig  . aspirin 81 MG tablet Take 81 mg by mouth daily.  Marland Kitchen atorvastatin (LIPITOR) 40 MG tablet Take 1 tablet (40 mg total) by mouth daily.  . blood glucose meter kit and supplies Dispense based on patient and insurance preference. Use up to four times daily as directed. (FOR ICD-10 E10.9, E11.9).  . dapagliflozin propanediol (FARXIGA) 10 MG TABS tablet Take 10 mg by mouth in the morning.  Marland Kitchen glucose blood test strip Use as instructed to blood sugar twice daily  . Insulin Pen Needle 32G X 6 MM MISC To fit TRESIBA PEN  . lisinopril (ZESTRIL) 20 MG tablet Take 1 tablet (20 mg total) by mouth daily.  . Semaglutide,0.25 or 0.5MG/DOS, (OZEMPIC, 0.25 OR 0.5 MG/DOSE,) 2 MG/1.5ML SOPN Inject 0.25 mg once weekly x 4 weeks then increase to 0.5 mg weekly  . triamcinolone (KENALOG) 0.1 % Apply 1 application topically 2 (two) times daily.  . [DISCONTINUED] carvedilol (COREG) 3.125 MG tablet Take 1 tablet (3.125 mg total) by mouth 2 (two) times daily.  . [DISCONTINUED] levothyroxine (SYNTHROID) 100 MCG tablet TAKE 1 TABLET(100 MCG) BY MOUTH DAILY  . [DISCONTINUED] pantoprazole (PROTONIX) 40 MG tablet TAKE 1 TABLET BY MOUTH DAILY AS NEEDED  . carvedilol (COREG) 3.125 MG tablet Take 1 tablet (3.125 mg total) by mouth 2 (two) times daily.  Marland Kitchen levothyroxine (SYNTHROID) 100 MCG tablet TAKE 1 TABLET(100 MCG) BY MOUTH DAILY  . pantoprazole (PROTONIX) 40 MG tablet Take 1 tablet (40 mg total) by mouth daily as needed.  . [DISCONTINUED] cyanocobalamin 1000 MCG tablet Take 1,000 mcg by mouth as needed. Takes occasionally (Patient not taking: No sig reported)  . [DISCONTINUED] Glucosamine 500 MG CAPS Take 1 capsule by mouth as needed. (Patient not taking: No sig reported)   No facility-administered encounter medications on file as of 06/02/2020.    Review of Systems   Constitutional:       Continues to lose weight.  States eating.    HENT: Negative for congestion and sinus pressure.   Respiratory: Negative for cough and chest tightness.        Breathing stable.   Cardiovascular: Negative for chest pain, palpitations and leg swelling.  Gastrointestinal: Negative for abdominal pain, diarrhea, nausea and vomiting.  Genitourinary: Negative for difficulty urinating and dysuria.  Musculoskeletal: Negative for joint swelling and myalgias.  Skin: Negative for color change and rash.  Neurological: Negative for dizziness, light-headedness and headaches.  Psychiatric/Behavioral: Negative for agitation and dysphoric mood.       Objective:  Physical Exam Vitals reviewed.  Constitutional:      General: She is not in acute distress.    Appearance: Normal appearance.  HENT:     Head: Normocephalic and atraumatic.     Right Ear: External ear normal.     Left Ear: External ear normal.  Eyes:     General: No scleral icterus.       Right eye: No discharge.        Left eye: No discharge.     Conjunctiva/sclera: Conjunctivae normal.  Neck:     Thyroid: No thyromegaly.  Cardiovascular:     Rate and Rhythm: Normal rate and regular rhythm.  Pulmonary:     Effort: No respiratory distress.     Breath sounds: Normal breath sounds. No wheezing.  Abdominal:     General: Bowel sounds are normal.     Palpations: Abdomen is soft.     Tenderness: There is no abdominal tenderness.  Musculoskeletal:        General: No swelling or tenderness.     Cervical back: Neck supple. No tenderness.  Lymphadenopathy:     Cervical: No cervical adenopathy.  Skin:    Findings: No erythema or rash.  Neurological:     Mental Status: She is alert.  Psychiatric:        Mood and Affect: Mood normal.        Behavior: Behavior normal.     BP 126/72   Pulse 62   Temp (!) 96.1 F (35.6 C) (Temporal)   Resp 16   Ht _0  (1.575 m)   Wt 128 lb 3.2 oz (58.2 kg)   LMP  03/10/1991   SpO2 99%   BMI 23.45 kg/m  Wt Readings from Last 3 Encounters:  06/02/20 128 lb (58.1 kg)  06/02/20 128 lb 3.2 oz (58.2 kg)  03/23/20 134 lb 12.8 oz (61.1 kg)     Lab Results  Component Value Date   WBC 7.8 03/23/2020   HGB 14.3 03/23/2020   HCT 43.4 03/23/2020   PLT 137.0 (L) 03/23/2020   GLUCOSE 164 (H) 03/23/2020   CHOL 137 03/23/2020   TRIG 139.0 03/23/2020   HDL 42.40 03/23/2020   LDLDIRECT 71 10/18/2017   LDLCALC 67 03/23/2020   ALT 14 03/23/2020   AST 13 03/23/2020   NA 137 03/23/2020   K 4.6 03/23/2020   CL 103 03/23/2020   CREATININE 1.12 03/23/2020   BUN 20 03/23/2020   CO2 28 03/23/2020   TSH 1.93 03/23/2020   INR 0.87 08/07/2017   HGBA1C 7.4 (H) 03/23/2020   MICROALBUR <0.7 08/13/2019    CT CHEST LUNG CANCER SCREENING LOW DOSE WO CONTRAST  Result Date: 08/26/2019 CLINICAL DATA:  75 year old female with 53 pack-year history of smoking. Lung cancer screening. EXAM: CT CHEST WITHOUT CONTRAST LOW-DOSE FOR LUNG CANCER SCREENING TECHNIQUE: Multidetector CT imaging of the chest was performed following the standard protocol without IV contrast. COMPARISON:  08/13/2018 FINDINGS: Cardiovascular: The heart size is normal. No substantial pericardial effusion. Coronary artery calcification is evident. Atherosclerotic calcification is noted in the wall of the thoracic aorta. Mediastinum/Nodes: No mediastinal lymphadenopathy. No evidence for gross hilar lymphadenopathy although assessment is limited by the lack of intravenous contrast on today's study. The esophagus has normal imaging features. There is no axillary lymphadenopathy. Lungs/Pleura: Centrilobular emphsyema noted. Scattered tiny pulmonary nodules identified previously are stable. No new suspicious pulmonary nodule or mass. No focal airspace consolidation. No pleural effusion. Upper Abdomen: Stable small cyst upper pole  left kidney. Musculoskeletal: No worrisome lytic or sclerotic osseous abnormality.  IMPRESSION: 1. Lung-RADS 2, benign appearance or behavior. Continue annual screening with low-dose chest CT without contrast in 12 months. 2. Aortic Atherosclerosis (ICD10-I70.0) and Emphysema (ICD10-J43.9). Electronically Signed   By: Misty Stanley M.D.   On: 08/26/2019 11:13       Assessment & Plan:   Problem List Items Addressed This Visit    Aortic atherosclerosis ([place])    Continue lipitor.       Relevant Medications   carvedilol (COREG) 3.125 MG tablet   Breast cancer screening    Agreeable to mammogram.       Relevant Orders   MM 3D SCREEN BREAST BILATERAL   Diabetes mellitus ([place])    On farxiga and ozempic.  Continuing to lose weight.  She reports she is eating.  GI symptoms - improved.  Feels better.  No nausea or vomiting.  She is watching her diet.  Follow weight.  Follow met b and a1c.       GERD (gastroesophageal reflux disease)    No upper symptoms reported. On protonix.       Relevant Medications   pantoprazole (PROTONIX) 40 MG tablet   Hypercholesterolemia    Continue lipitor.  Follow lipid panel and liver function tests.        Relevant Medications   carvedilol (COREG) 3.125 MG tablet   Hypertension    Continue lisinopril.  Blood pressure as outlined.  Hold on making any changes. Follow pressures.  Follow metabolic panel.       Relevant Medications   carvedilol (COREG) 3.125 MG tablet   Hypothyroidism    On thyroid replacement.  Follow tsh.       Relevant Medications   levothyroxine (SYNTHROID) 100 MCG tablet   carvedilol (COREG) 3.125 MG tablet   Personal history of tobacco use, presenting hazards to health    Have discussed the need to quit smoking.        Thrombocytopenia ([place])    Follow cbc.        Other Visit Diagnoses    Estrogen deficiency    -  Primary   Relevant Orders   DG Bone Density       Einar Pheasant, MD

## 2020-06-02 NOTE — Progress Notes (Signed)
Subjective:   SOLA MARGOLIS is a 75 y.o. female who presents for Medicare Annual (Subsequent) preventive examination.  Review of Systems    No ROS.  Medicare Wellness Virtual Visit.   Cardiac Risk Factors include: advanced age (>75mn, >>37women);diabetes mellitus     Objective:    Today's Vitals   06/02/20 1235  BP: 126/72  SpO2: 99%  Weight: 128 lb (58.1 kg)  Height: _0  (1.575 m)   Body mass index is 23.41 kg/m.  Advanced Directives 06/02/2020 05/31/2019 05/30/2018 09/19/2017 08/07/2017 05/12/2017  Does Patient Have a Medical Advance Directive? No No Yes No No Yes  Type of Advance Directive - -Public librarianLiving will - - [place]  Does patient want to make changes to medical advance directive? - - No - Patient declined - - No - Patient declined  Copy of [place]? - - No - copy requested - - No - copy requested  Would patient like information on creating a medical advance directive? No - Patient declined Yes (MAU/Ambulatory/Procedural Areas - Information given) - - No - Patient declined -    Current Medications (verified) Outpatient Encounter Medications as of 06/02/2020  Medication Sig  . aspirin 81 MG tablet Take 81 mg by mouth daily.  .Marland Kitchenatorvastatin (LIPITOR) 40 MG tablet Take 1 tablet (40 mg total) by mouth daily.  . blood glucose meter kit and supplies Dispense based on patient and insurance preference. Use up to four times daily as directed. (FOR ICD-10 E10.9, E11.9).  . carvedilol (COREG) 3.125 MG tablet Take 1 tablet (3.125 mg total) by mouth 2 (two) times daily.  . cyanocobalamin 1000 MCG tablet Take 1,000 mcg by mouth as needed. Takes occasionally (Patient not taking: No sig reported)  . dapagliflozin propanediol (FARXIGA) 10 MG TABS tablet Take 10 mg by mouth in the morning.  . Glucosamine 500 MG CAPS Take 1 capsule by mouth as needed. (Patient not taking: No sig reported)  . glucose  blood test strip Use as instructed to blood sugar twice daily  . Insulin Pen Needle 32G X 6 MM MISC To fit TRESIBA PEN  . levothyroxine (SYNTHROID) 100 MCG tablet TAKE 1 TABLET(100 MCG) BY MOUTH DAILY  . lisinopril (ZESTRIL) 20 MG tablet Take 1 tablet (20 mg total) by mouth daily.  . pantoprazole (PROTONIX) 40 MG tablet Take 1 tablet (40 mg total) by mouth daily as needed.  . Semaglutide,0.25 or 0.5MG/DOS, (OZEMPIC, 0.25 OR 0.5 MG/DOSE,) 2 MG/1.5ML SOPN Inject 0.25 mg once weekly x 4 weeks then increase to 0.5 mg weekly  . triamcinolone (KENALOG) 0.1 % Apply 1 application topically 2 (two) times daily.  . [DISCONTINUED] carvedilol (COREG) 3.125 MG tablet Take 1 tablet (3.125 mg total) by mouth 2 (two) times daily.  . [DISCONTINUED] levothyroxine (SYNTHROID) 100 MCG tablet TAKE 1 TABLET(100 MCG) BY MOUTH DAILY  . [DISCONTINUED] pantoprazole (PROTONIX) 40 MG tablet TAKE 1 TABLET BY MOUTH DAILY AS NEEDED   No facility-administered encounter medications on file as of 06/02/2020.    Allergies (verified) Pioglitazone, Macrobid [nitrofurantoin macrocrystal], and Nitrofurantoin   History: Past Medical History:  Diagnosis Date  . Abdominal hernia   . CAD (coronary artery disease)    a. LHC 7/19: LM nl, p-mLAD 100% w/ right to left collaterals, OM2 90% (2 mm), 1st LPL 40%, medical management  . Diabetes [place]   . Diverticulosis   . GERD (gastroesophageal reflux disease)   .  Hematuria   . Hypercholesterolemia   . Hypertension   . Hypothyroidism   . Kidney stones    Past Surgical History:  Procedure Laterality Date  . APPENDECTOMY    . BREAST BIOPSY  1969   right  . CHOLECYSTECTOMY  2005  . COLONOSCOPY    . COLONOSCOPY WITH PROPOFOL N/A 09/19/2017   Procedure: COLONOSCOPY WITH PROPOFOL;  Surgeon: Toledo, Benay Pike, MD;  Location: [facility] ENDOSCOPY;  Service: Gastroenterology;  Laterality: N/A;  . ESOPHAGOGASTRODUODENOSCOPY (EGD) WITH PROPOFOL N/A 09/19/2017   Procedure:  ESOPHAGOGASTRODUODENOSCOPY (EGD) WITH PROPOFOL;  Surgeon: Toledo, Benay Pike, MD;  Location: [facility] ENDOSCOPY;  Service: Gastroenterology;  Laterality: N/A;  . LEFT HEART CATH AND CORONARY ANGIOGRAPHY Left 08/07/2017   Procedure: LEFT HEART CATH AND CORONARY ANGIOGRAPHY;  Surgeon: Wellington Hampshire, MD;  Location: [place] [facility];  Service: Cardiovascular;  Laterality: Left;  . UPPER GI ENDOSCOPY     Family History  Problem Relation Age of Onset  . Brain cancer Mother        history of brain tumor  . Lung disease Father   . Diabetes Brother        x2  . Bipolar disorder Son   . Alcohol abuse Son   . Breast cancer Neg Hx   . Colon cancer Neg Hx    Social History   Socioeconomic History  . Marital status: Married    Spouse name: Not on file  . Number of children: Not on file  . Years of education: Not on file  . Highest education level: Not on file  Occupational History  . Not on file  Tobacco Use  . Smoking status: Current Every Day Smoker    Packs/day: 1.00    Years: 50.00    Pack years: 50.00  . Smokeless tobacco: Never Used  Vaping Use  . Vaping Use: Never used  Substance and Sexual Activity  . Alcohol use: No    Alcohol/week: 0.0 standard drinks  . Drug use: No  . Sexual activity: Not on file  Other Topics Concern  . Not on file  Social History Narrative  . Not on file   Social Determinants of Health   Financial Resource Strain: Medium Risk  . Difficulty of Paying Living Expenses: Somewhat hard  Food Insecurity: No Food Insecurity  . Worried About Charity fundraiser in the Last Year: Never true  . Ran Out of Food in the Last Year: Never true  Transportation Needs: No Transportation Needs  . Lack of Transportation (Medical): No  . Lack of Transportation (Non-Medical): No  Physical Activity: Insufficiently Active  . Days of Exercise per Week: 3 days  . Minutes of Exercise per Session: 30 min  Stress: Stress Concern Present  . Feeling of Stress : Rather  much  Social Connections: Unknown  . Frequency of Communication with Friends and Family: Not on file  . Frequency of Social Gatherings with Friends and Family: Not on file  . Attends Religious Services: Not on file  . Active Member of Clubs or Organizations: Not on file  . Attends Archivist Meetings: Not on file  . Marital Status: Married    Tobacco Counseling Ready to quit: Not Answered Counseling given: Not Answered   Clinical Intake:  Pre-visit preparation completed: Yes        Diabetes Management: Is the patient seen by Chronic Care Management for management of their diabetes?  Yes   How often do you need to have someone  help you when you read instructions, pamphlets, or other written materials from your doctor or pharmacy?: 1 - Never   Interpreter Needed?: No    Activities of Daily Living In your present state of health, do you have any difficulty performing the following activities: 06/02/2020  Hearing? N  Vision? N  Difficulty concentrating or making decisions? N  Walking or climbing stairs? Y  Comment Shortness of breath on exertion  Dressing or bathing? N  Doing errands, shopping? N  Preparing Food and eating ? N  Using the Toilet? N  In the past six months, have you accidently leaked urine? N  Do you have problems with loss of bowel control? N  Managing your Medications? N  Managing your Finances? N  Housekeeping or managing your Housekeeping? N  Some recent data might be hidden    Patient Care Team: Einar Pheasant, MD as PCP - General (Internal Medicine) Wellington Hampshire, MD as PCP - Cardiology (Cardiology) De Hollingshead, RPH-CPP as Pharmacist (Pharmacist)  Indicate any recent Medical Services you may have received from other than [facility] providers in the past year (date may be approximate).     Assessment:   This is a routine wellness examination for Zsazsa.  I connected with Briannon today by telephone and verified that I am speaking  with the correct person using two identifiers. Location patient: home Location provider: work Persons participating in the virtual visit: patient, Marine scientist.    I discussed the limitations, risks, security and privacy concerns of performing an evaluation and management service by telephone and the availability of in person appointments. The patient expressed understanding and verbally consented to this telephonic visit.    Interactive audio and video telecommunications were attempted between this provider and patient, however failed, due to patient having technical difficulties OR patient did not have access to video capability.  We continued and completed visit with audio only.  Some vital signs may be absent or patient reported.   Hearing/Vision screen  Hearing Screening   _0  _1  _2  _3  _4  _5  _6  _7  _8   Right ear:           Left ear:           Comments: Patient is able to hear conversational tones without difficulty.  No issues reported.  Vision Screening Comments: Followed by  Wears corrective lenses Cataract extraction, bilateral Visual acuity not assessed, virtual visit.  They have seen their ophthalmologist in the last 12 months.   Dietary issues and exercise activities discussed: Current Exercise Habits: Home exercise routine, Type of exercise: treadmill, Time (Minutes): 45, Frequency (Times/Week): 3, Weekly Exercise (Minutes/Week): 135, Intensity: Mild Low carb diet Good water intake   Goals      Patient Stated   .  Medication Monitoring (pt-stated)      Patient Goals/Self-Care Activities . Over the next 90 days, patient will:  - take medications as prescribed check glucose daily, alternating between fasting and post prandial, document, and provide at future appointments collaborate with provider on medication access solutions        Depression Screen PHQ 2/9 Scores 06/02/2020 06/02/2020 05/31/2019 05/30/2018 05/12/2017 12/09/2016 06/10/2016  PHQ  - 2 Score 0 0 1 0 0 0 0  PHQ- 9 Score - - - - - - 0    Fall Risk Fall Risk  06/02/2020 06/02/2020 05/31/2019 01/23/2019 05/30/2018  Falls in the past year? 0 0 0 1 0  Number falls in past yr: 0 - - 1 -  Injury with Fall? 0 - - 1 -  Risk for fall due to : - - - - -  Follow up Falls evaluation completed - Falls evaluation completed Falls evaluation completed -    FALL RISK PREVENTION PERTAINING TO THE HOME: Handrails in use when climbing stairs? Yes Home free of loose throw rugs in walkways, pet beds, electrical cords, etc? Yes  Adequate lighting in your home to reduce risk of falls? Yes   ASSISTIVE DEVICES UTILIZED TO PREVENT FALLS: Life alert? No  Use of a cane, walker or w/c? No   TIMED UP AND GO: Was the test performed? No . Virtual visit.   Cognitive Function:  Patient is alert and oriented x3.  Denies difficulty focusing, making decisions, memory loss.  Enjoys brain health activities. MMSE/6CIT deferred. Normal by direct communication/observation.    6CIT Screen 05/31/2019 05/30/2018 05/12/2017  What Year? 0 points 0 points 0 points  What month? 0 points 0 points 0 points  What time? 0 points 0 points 0 points  Count back from 20 - 0 points 0 points  Months in reverse 0 points 0 points 0 points  Repeat phrase - - 0 points  Total Score - - 0    Immunizations Immunization History  Administered Date(s) Administered  . Fluad Quad(high Dose 65+) 11/11/2019  . Influenza Split 10/16/2013  . Influenza Whole 11/24/2016  . Influenza, High Dose Seasonal PF 11/05/2017, 10/18/2018  . Influenza,inj,quad, With Preservative 11/07/2017  . Influenza-Unspecified 11/07/2012, 10/27/2014  . Moderna Sars-Covid-2 Vaccination 03/09/2019, 04/07/2019, 01/30/2020  . Pneumococcal Conjugate-13 03/02/2016  . Pneumococcal Polysaccharide-23 12/08/2012, 03/02/2017  . Tdap 10/14/2013   Health Maintenance Health Maintenance  Topic Date Due  . DEXA SCAN  Never done  . OPHTHALMOLOGY EXAM  12/13/2017   . MAMMOGRAM  09/10/2019  . Fecal DNA (Cologuard)  06/14/2020  . COVID-19 Vaccine (4 - Booster for Moderna series) 07/30/2020  . FOOT EXAM  08/12/2020  . INFLUENZA VACCINE  09/07/2020  . HEMOGLOBIN A1C  09/20/2020  . TETANUS/TDAP  10/15/2023  . Hepatitis C Screening  Completed  . PNA vac Low Risk Adult  Completed  . HPV VACCINES  Aged Out   Lung Cancer Screening: (Low Dose CT Chest recommended) Last completed 08/26/19.  Dental Screening: Recommended annual dental exams for proper oral hygiene. [employer].   Community Resource Referral / Chronic Care Management: CRR required this visit?  No   CCM required this visit?  No      Plan:   Keep all routine maintenance appointments.   CCM 07/01/20 @ 9:00  Follow up 07/28/20 @ 7:30  I have personally reviewed and noted the following in the patient's chart:   . Medical and social history . Use of alcohol, tobacco or illicit drugs  . Current medications and supplements . Functional ability and status . Nutritional status . Physical activity . Advanced directives . List of other physicians . Hospitalizations, surgeries, and ER visits in previous 12 months . Vitals . Screenings to include cognitive, depression, and falls . Referrals and appointments  In addition, I have reviewed and discussed with patient certain preventive protocols, quality metrics, and best practice recommendations. A written personalized care plan for preventive services as well as general preventive health recommendations were provided to patient via mychart.     Varney Biles, LPN   09/01/2033

## 2020-06-07 ENCOUNTER — Telehealth: Payer: Self-pay | Admitting: Internal Medicine

## 2020-06-07 ENCOUNTER — Encounter: Payer: Self-pay | Admitting: Internal Medicine

## 2020-06-07 NOTE — Assessment & Plan Note (Signed)
Continue lipitor.  Follow lipid panel and liver function tests.   

## 2020-06-07 NOTE — Assessment & Plan Note (Signed)
On thyroid replacement.  Follow tsh.  

## 2020-06-07 NOTE — Assessment & Plan Note (Signed)
Continue lipitor  ?

## 2020-06-07 NOTE — Assessment & Plan Note (Signed)
No upper symptoms reported.  On protonix.   

## 2020-06-07 NOTE — Assessment & Plan Note (Signed)
Have discussed the need to quit smoking.   

## 2020-06-07 NOTE — Telephone Encounter (Signed)
Needs mammogram and bone density scheduled.  Thanks.  If she is scheduling, please remind her to schedule both.

## 2020-06-07 NOTE — Assessment & Plan Note (Signed)
Follow cbc.  

## 2020-06-07 NOTE — Assessment & Plan Note (Signed)
Agreeable to mammogram.

## 2020-06-07 NOTE — Assessment & Plan Note (Signed)
Continue lisinopril.  Blood pressure as outlined.  Hold on making any changes. Follow pressures.  Follow metabolic panel.

## 2020-06-07 NOTE — Assessment & Plan Note (Signed)
On farxiga and ozempic.  Continuing to lose weight.  She reports she is eating.  GI symptoms - improved.  Feels better.  No nausea or vomiting.  She is watching her diet.  Follow weight.  Follow met b and a1c.

## 2020-06-08 ENCOUNTER — Ambulatory Visit: Payer: Medicare Other | Admitting: Pharmacist

## 2020-06-08 ENCOUNTER — Telehealth: Payer: Self-pay | Admitting: Internal Medicine

## 2020-06-08 DIAGNOSIS — I700 Atherosclerosis of aorta: Secondary | ICD-10-CM

## 2020-06-08 DIAGNOSIS — E7800 Pure hypercholesterolemia, unspecified: Secondary | ICD-10-CM

## 2020-06-08 DIAGNOSIS — E1165 Type 2 diabetes mellitus with hyperglycemia: Secondary | ICD-10-CM

## 2020-06-08 DIAGNOSIS — I10 Essential (primary) hypertension: Secondary | ICD-10-CM

## 2020-06-08 NOTE — Telephone Encounter (Addendum)
Spoke with patient. Medication arrived in her mailbox yesterday, she confirmed she had it.

## 2020-06-08 NOTE — Telephone Encounter (Signed)
Pt says she has called the customer service line that was given to her twice and she still has not received her medication. Do we have samples of Farxiga?

## 2020-06-08 NOTE — Telephone Encounter (Signed)
Patient is aware and will call and schedule herself. She is having to keep her grandson so she needs to schedule around when she has him.

## 2020-06-08 NOTE — Patient Instructions (Signed)
Visit Information  PATIENT GOALS: Goals Addressed              This Visit's Progress     Patient Stated   .  Medication Monitoring (pt-stated)        Patient Goals/Self-Care Activities . Over the next 90 days, patient will:  - take medications as prescribed check glucose daily, alternating between fasting and post prandial, document, and provide at future appointments collaborate with provider on medication access solutions       Patient verbalizes understanding of instructions provided today and agrees to view in [place].   Plan: Telephone follow up appointment with care management team member scheduled for:  ~ 4 weeks as previously scheduled  [name], PharmD, [place], [place] Clinical Pharmacist [employer] [facility] [employer] [phone]

## 2020-06-08 NOTE — Chronic Care Management (AMB) (Signed)
Chronic Care Management Pharmacy Note  06/08/2020 Name:  Ann Collins MRN:  [id number] DOB:  [birth year]  Subjective: Ann Collins is an 75 y.o. year old female who is a primary patient of Einar Pheasant, MD.  The CCM team was consulted for assistance with disease management and care coordination needs.    Engaged with patient by telephone for reponse to call with medication access questions in response to provider referral for pharmacy case management and/or care coordination services.   Consent to Services:  The patient was given information about Chronic Care Management services, agreed to services, and gave verbal consent prior to initiation of services.  Please see initial visit note for detailed documentation.   Patient Care Team: Einar Pheasant, MD as PCP - General (Internal Medicine) Wellington Hampshire, MD as PCP - Cardiology (Cardiology) De Hollingshead, RPH-CPP as Pharmacist (Pharmacist)  Recent office visits:  PCP visit 4/26 - continue current regimen  Recent consult visits: None since our last call  Hospital visits: None in previous 6 months  Objective:  Lab Results  Component Value Date   CREATININE 1.12 03/23/2020   CREATININE 1.16 12/16/2019   CREATININE 1.04 08/13/2019    Lab Results  Component Value Date   HGBA1C 7.4 (H) 03/23/2020   Last diabetic Eye exam:  Lab Results  Component Value Date/Time   HMDIABEYEEXA No Retinopathy 12/13/2016 12:00 AM    Last diabetic Foot exam:  Lab Results  Component Value Date/Time   HMDIABFOOTEX on my exam 08/13/2019 12:00 AM        Component Value Date/Time   CHOL 137 03/23/2020 1559   CHOL 134 10/18/2017 1447   TRIG 139.0 03/23/2020 1559   HDL 42.40 03/23/2020 1559   HDL 41 10/18/2017 1447   CHOLHDL 3 03/23/2020 1559   VLDL 27.8 03/23/2020 1559   LDLCALC 67 03/23/2020 1559   LDLCALC 63 10/18/2017 1447   LDLDIRECT 71 10/18/2017 1447   LDLDIRECT 156.0 08/29/2016 1523    Hepatic Function  Latest Ref Rng & Units 03/23/2020 12/16/2019 08/13/2019  Total Protein 6.0 - 8.3 g/dL 6.9 6.6 6.8  Albumin 3.5 - 5.2 g/dL 4.2 4.2 4.3  AST 0 - 37 U/L _0 ALT 0 - 35 U/L _1 Alk Phosphatase 39 - 117 U/L 91 95 96  Total Bilirubin 0.2 - 1.2 mg/dL 0.6 0.5 0.6  Bilirubin, Direct 0.0 - 0.3 mg/dL 0.1 0.1 0.1    Lab Results  Component Value Date/Time   TSH 1.93 03/23/2020 03:59 PM   TSH 3.75 08/13/2019 10:15 AM    CBC Latest Ref Rng & Units 03/23/2020 12/16/2019 08/13/2019  WBC 4.0 - 10.5 K/uL 7.8 7.8 9.2  Hemoglobin 12.0 - 15.0 g/dL 14.3 14.6 14.6  Hematocrit 36.0 - 46.0 % 43.4 44.8 44.2  Platelets 150.0 - 400.0 K/uL 137.0(L) 125.0(L) 119.0(L)   Clinical ASCVD: No  The 10-year ASCVD risk score Mikey Bussing DC Jr., et al., 2013) is: 43.4%   Values used to calculate the score:     Age: 29 years     Sex: Female     Is Non-Hispanic African American: No     Diabetic: Yes     Tobacco smoker: Yes     Systolic Blood Pressure: 497 mmHg     Is BP treated: Yes     HDL Cholesterol: 42.4 mg/dL     Total Cholesterol: 137 mg/dL     Social History   Tobacco Use  Smoking Status  Current Every Day Smoker  . Packs/day: 1.00  . Years: 50.00  . Pack years: 50.00  Smokeless Tobacco Never Used   BP Readings from Last 3 Encounters:  06/02/20 126/72  06/02/20 126/72  04/05/20 132/68   Pulse Readings from Last 3 Encounters:  06/02/20 62  03/23/20 70  12/16/19 67   Wt Readings from Last 3 Encounters:  06/02/20 128 lb (58.1 kg)  06/02/20 128 lb 3.2 oz (58.2 kg)  03/23/20 134 lb 12.8 oz (61.1 kg)    Assessment: Review of patient past medical history, allergies, medications, health status, including review of consultants reports, laboratory and other test data, was performed as part of comprehensive evaluation and provision of chronic care management services.   SDOH:  (Social Determinants of Health) assessments and interventions performed:  SDOH Interventions   Flowsheet Row Most Recent  Value  SDOH Interventions   Financial Strain Interventions Other (Comment)  [manufacturer assistance]      CCM Care Plan  Allergies  Allergen Reactions  . Pioglitazone Swelling  . Macrobid [Nitrofurantoin Macrocrystal] Other (See Comments)    Body aches  . Nitrofurantoin Other (See Comments)    Body aches    Medications Reviewed Today    Reviewed by Einar Pheasant, MD (Physician) on 06/07/20 at [place] List Status: <None>  Medication Order Taking? Sig Documenting Provider Last Dose Status Informant  aspirin 81 MG tablet [id number] Yes Take 81 mg by mouth daily. [provider] Taking Active   atorvastatin (LIPITOR) 40 MG tablet [id number] Yes Take 1 tablet (40 mg total) by mouth daily. Einar Pheasant, MD Taking Active   blood glucose meter kit and supplies [id number] Yes Dispense based on patient and insurance preference. Use up to four times daily as directed. (FOR ICD-10 E10.9, E11.9). Einar Pheasant, MD Taking Active   carvedilol (COREG) 3.125 MG tablet [id number]  Take 1 tablet (3.125 mg total) by mouth 2 (two) times daily. Einar Pheasant, MD  Active   cyanocobalamin 1000 MCG tablet [id number] No Take 1,000 mcg by mouth as needed. Takes occasionally  Patient not taking: No sig reported   [provider] Not Taking Consider Medication Status and Discontinue   dapagliflozin propanediol (FARXIGA) 10 MG TABS tablet [id number] Yes Take 10 mg by mouth in the morning. [provider] Taking Active   Glucosamine 500 MG CAPS [id number] No Take 1 capsule by mouth as needed.  Patient not taking: No sig reported   [provider] Not Taking Consider Medication Status and Discontinue            Med Note Darnelle Maffucci, Estevon Fluke E   Thu Sep 20, 2018 11:05 AM) Taking for wrist pain  glucose blood test strip [id number] Yes Use as instructed to blood sugar twice daily Einar Pheasant, MD Taking Active   Insulin Pen Needle 32G X 6 MM MISC [id number] Yes To fit TRESIBA  PEN Crecencio Mc, MD Taking Active   levothyroxine (SYNTHROID) 100 MCG tablet [id number]  TAKE 1 TABLET(100 MCG) BY MOUTH DAILY Einar Pheasant, MD  Active   lisinopril (ZESTRIL) 20 MG tablet [id number] Yes Take 1 tablet (20 mg total) by mouth daily. Einar Pheasant, MD Taking Active   pantoprazole (PROTONIX) 40 MG tablet [id number]  Take 1 tablet (40 mg total) by mouth daily as needed. Einar Pheasant, MD  Active   Semaglutide,0.25 or 0.5MG/DOS, (OZEMPIC, 0.25 OR 0.5 MG/DOSE,) 2 MG/1.5ML SOPN [id number] Yes Inject 0.25 mg once weekly x 4 weeks then increase to 0.5 mg weekly  Einar Pheasant, MD Taking Active            Med Note Rosana Hoes, Maggie Schwalbe   Tue Jun 02, 2020  8:02 AM) Patient is taking 0.58m weekly  triamcinolone (KENALOG) 0.1 % [id number] Apply 1 application topically 2 (two) times daily. SEinar Pheasant MD Taking Active           Patient Active Problem List   Diagnosis Date Noted  . Skin lesion 04/05/2020  . Adrenal adenoma 08/19/2019  . Breast cancer screening 08/19/2019  . Thrombocytopenia ([place] 01/27/2019  . Left wrist fracture 04/27/2018  . Cough 04/27/2018  . Abnormal EKG   . Chest pain 07/10/2017  . Renal lesion 04/10/2016  . Aortic atherosclerosis ([place] 06/30/2015  . Personal history of tobacco use, presenting hazards to health 06/25/2015  . Left carotid bruit 12/28/2014  . Health care maintenance 07/29/2014  . Diabetes mellitus ([place] 03/10/2012  . Hypothyroidism 03/10/2012  . Hypercholesterolemia 03/10/2012  . Hypertension 03/10/2012  . GERD (gastroesophageal reflux disease) 03/10/2012    Immunization History  Administered Date(s) Administered  . Fluad Quad(high Dose 65+) 11/11/2019  . Influenza Split 10/16/2013  . Influenza Whole 11/24/2016  . Influenza, High Dose Seasonal PF 11/05/2017, 10/18/2018  . Influenza,inj,quad, With Preservative 11/07/2017  . Influenza-Unspecified 11/07/2012, 10/27/2014  . Moderna Sars-Covid-2 Vaccination 03/09/2019, 04/07/2019,  01/30/2020  . Pneumococcal Conjugate-13 03/02/2016  . Pneumococcal Polysaccharide-23 12/08/2012, 03/02/2017  . Tdap 10/14/2013    Conditions to be addressed/monitored: HTN, HLD and DMII  Care Plan : Medication Management  Updates made by TDe Hollingshead RPH-CPP since 06/08/2020 12:00 AM    Problem: Diabetes, HLD, CKD     Long-Range Goal: Disease Progression Prevention   This Visit's Progress: On track  Recent Progress: On track  Priority: High  Note:   Current Barriers:  . Unable to independently afford treatment regimen . Unable to independently monitor therapeutic efficacy . Unable to maintain control of diabetes  Pharmacist Clinical Goal(s):  .Marland KitchenOver the next 90 days, patient will verbalize ability to afford treatment regimen . Over the next 90 days, patient will achieve control of diabetes as evidenced by A1c through collaboration with PharmD and provider.   Interventions: . 1:1 collaboration with SEinar Pheasant MD regarding development and update of comprehensive plan of care as evidenced by provider attestation and co-signature . Inter-disciplinary care team collaboration (see longitudinal plan of care) . Comprehensive medication review performed; medication list updated in electronic medical record  Medication Management: . Previously provided BID pill box to patient. Notes that she tried to use, but didn't like taking a handful of meds at once. Endorses 100% compliance (notes she'll sometimes take meds later, but will take them)  Diabetes: . Uncontrolled; current treatment: Farxiga 10 mg daily, Ozempic 0.5 mg weekly o Hx metformin- stopped after a 2019 hospitalization, reason for discontinuation unclear  o Hx Tresiba - discontinued when no longer needed . Calls today to report she had [place] [facility] JSharee PimpleSimcox, CPhT. [place] rep confirmed that medication was delivered to the mailbox yesterday. Patient checked mailbox and did receive the  medication.   Hypertension: . Controlled per last clinic reading; current treatment: losartan 25 mg daily, carvedilol 3.125 mg BID . Previously recommended to continue current regimen. Will discuss occasional home checks at future appointments.   Hyperlipidemia and ASCVD risk reduction: . Controlled per last lipid panel; current treatment: atorvastatin 40 mg daily . Current antiplatelet regimen: aspirin 81 mg daily, though notes she sometimes  forgets  . Previously recommended to continue current regimen.  Hypothyroidism: . Controlled per last lab work; current regimen: levothyroxine 100 mcg daily . Previously recommended to continue current regimen at this time  Tobacco Abuse: . 1 packs per day; denies being in a place to be able to work on tobacco cessation d/t stressors in her life.  . Will continue to evaluate readiness to quit and remind of health benefits and benefits for those around her.   Acid Reflux: . Controlled per patient report; current regimen: pantoprazole 40 mg PRN . Previously recommended to continue current regimen at this time. Will discuss ability for trial of H2RA instead in the future.   Supplements: . Reports current use of glucosamine PRN wrist pain w/ benefit; vitamin B12 1000 units daily . No interactions noted. Continue current regimen at this time  Patient Goals/Self-Care Activities . Over the next 90 days, patient will:  - take medications as prescribed check glucose daily, alternating between fasting and post prandial, document, and provide at future appointments collaborate with provider on medication access solutions  Follow Up Plan: Telephone follow up appointment with care management team member scheduled for: ~ 4 weeks      Medication Assistance: Jardiance obtained through [place] medication assistance program.  Enrollment ends 02/06/21. Ozempic obtained through [employer] medication assistance program.  Enrollment ends 01/06/21  Patient's  preferred pharmacy is:  [facility] [place], [place] - [place] AT [facility] [address] [place] [address] Phone: [phone] Fax: [phone]   Follow Up:  Patient agrees to Care Plan and Follow-up.  Plan: Telephone follow up appointment with care management team member scheduled for:  ~ 4 weeks as previously scheduled  [name], PharmD, [place], [place] Clinical Pharmacist [employer] at [employer] [phone]

## 2020-06-08 NOTE — Telephone Encounter (Signed)
PT called to see if we have any samples of dapagliflozin propanediol (FARXIGA) 10 MG TABS tablet. As she is currently out till her next shipment of meds arrive at her home.

## 2020-06-15 ENCOUNTER — Ambulatory Visit
Admission: RE | Admit: 2020-06-15 | Discharge: 2020-06-15 | Disposition: A | Discharge status: Home / Self Care | Payer: Medicare Other | Source: Ambulatory Visit | Attending: Internal Medicine | Admitting: Internal Medicine

## 2020-06-15 ENCOUNTER — Other Ambulatory Visit: Payer: Self-pay

## 2020-06-15 DIAGNOSIS — Z1231 Encounter for screening mammogram for malignant neoplasm of breast: Secondary | ICD-10-CM | POA: Diagnosis present

## 2020-06-15 DIAGNOSIS — E2839 Other primary ovarian failure: Secondary | ICD-10-CM | POA: Diagnosis not present

## 2020-06-15 DIAGNOSIS — Z780 Asymptomatic menopausal state: Secondary | ICD-10-CM | POA: Diagnosis not present

## 2020-06-15 DIAGNOSIS — M85852 Other specified disorders of bone density and structure, left thigh: Secondary | ICD-10-CM | POA: Diagnosis not present

## 2020-06-22 DIAGNOSIS — H52223 Regular astigmatism, bilateral: Secondary | ICD-10-CM | POA: Diagnosis not present

## 2020-06-22 DIAGNOSIS — H5203 Hypermetropia, bilateral: Secondary | ICD-10-CM | POA: Diagnosis not present

## 2020-06-22 DIAGNOSIS — H43813 Vitreous degeneration, bilateral: Secondary | ICD-10-CM | POA: Diagnosis not present

## 2020-06-22 DIAGNOSIS — H524 Presbyopia: Secondary | ICD-10-CM | POA: Diagnosis not present

## 2020-06-22 DIAGNOSIS — Z961 Presence of intraocular lens: Secondary | ICD-10-CM | POA: Diagnosis not present

## 2020-06-22 DIAGNOSIS — Z7984 Long term (current) use of oral hypoglycemic drugs: Secondary | ICD-10-CM | POA: Diagnosis not present

## 2020-06-22 DIAGNOSIS — E119 Type 2 diabetes mellitus without complications: Secondary | ICD-10-CM | POA: Diagnosis not present

## 2020-06-22 LAB — HM DIABETES EYE EXAM

## 2020-07-01 ENCOUNTER — Ambulatory Visit (INDEPENDENT_AMBULATORY_CARE_PROVIDER_SITE_OTHER): Payer: Medicare Other | Admitting: Pharmacist

## 2020-07-01 DIAGNOSIS — E039 Hypothyroidism, unspecified: Secondary | ICD-10-CM | POA: Diagnosis not present

## 2020-07-01 DIAGNOSIS — E7800 Pure hypercholesterolemia, unspecified: Secondary | ICD-10-CM | POA: Diagnosis not present

## 2020-07-01 DIAGNOSIS — I10 Essential (primary) hypertension: Secondary | ICD-10-CM

## 2020-07-01 DIAGNOSIS — E1165 Type 2 diabetes mellitus with hyperglycemia: Secondary | ICD-10-CM

## 2020-07-01 DIAGNOSIS — I700 Atherosclerosis of aorta: Secondary | ICD-10-CM

## 2020-07-01 NOTE — Patient Instructions (Signed)
Ann Collins,   It was great talking to you today!  Try taking your lisinopril in the morning instead. You can take ALL of your regular medications in the morning to help you remember to take them every day.   For now, continue taking 1/2 lisinopril tablet and 1/2 atorvastatin tablet. We will get fasting labs before you see Dr. Fletcher Anon and Dr. Nicki Reaper. At that point, they can decide whether to continue your current doses and send new prescriptions to reflect the dose you are actually taking, or increase your medications back to a full tablet. I'll communicate this to both of them. The last thing we want is your insurance to be harassing [place] and harassing you about what dose you are supposed to be taking!  Call me with any questions or concerns!  [name], PharmD [phone]   Visit Information  PATIENT GOALS: Goals Addressed              This Visit's Progress     Patient Stated   .  Medication Monitoring (pt-stated)        Patient Goals/Self-Care Activities . Over the next 90 days, patient will:  - take medications as prescribed focus on medication adherence by moving lisinopril to AM administration check glucose daily, alternating between fasting and post prandial, document, and provide at future appointments collaborate with provider on medication access solutions       The patient verbalized understanding of instructions, educational materials, and care plan provided today and agreed to receive a mailed copy of patient instructions, educational materials, and care plan.   Plan: Telephone follow up appointment with care management team member scheduled for:  ~ 8 weeks  [name], PharmD, [place], [place] Clinical Pharmacist [employer] at [employer] [phone]

## 2020-07-01 NOTE — Chronic Care Management (AMB) (Signed)
Chronic Care Management Pharmacy Note  07/01/2020 Name:  Ann Collins MRN:  [id number] DOB:  18-Dec-1945  Subjective: Ann Collins is an 75 y.o. year old female who is a primary patient of Einar Pheasant, MD.  The CCM team was consulted for assistance with disease management and care coordination needs.    Engaged with patient by telephone for follow up visit in response to provider referral for pharmacy case management and/or care coordination services.   Consent to Services:  The patient was given information about Chronic Care Management services, agreed to services, and gave verbal consent prior to initiation of services.  Please see initial visit note for detailed documentation.   Patient Care Team: Einar Pheasant, MD as PCP - General (Internal Medicine) Wellington Hampshire, MD as PCP - Cardiology (Cardiology) De Hollingshead, RPH-CPP as Pharmacist (Pharmacist)  Recent office visits: None since our last call  Recent consult visits: None since our last call  Hospital visits: None in previous 6 months  Objective:  Lab Results  Component Value Date   CREATININE 1.12 03/23/2020   CREATININE 1.16 12/16/2019   CREATININE 1.04 08/13/2019    Lab Results  Component Value Date   HGBA1C 7.4 (H) 03/23/2020   Last diabetic Eye exam:  Lab Results  Component Value Date/Time   HMDIABEYEEXA No Retinopathy 06/22/2020 12:00 AM    Last diabetic Foot exam:  Lab Results  Component Value Date/Time   HMDIABFOOTEX on my exam 08/13/2019 12:00 AM        Component Value Date/Time   CHOL 137 03/23/2020 1559   CHOL 134 10/18/2017 1447   TRIG 139.0 03/23/2020 1559   HDL 42.40 03/23/2020 1559   HDL 41 10/18/2017 1447   CHOLHDL 3 03/23/2020 1559   VLDL 27.8 03/23/2020 1559   LDLCALC 67 03/23/2020 1559   LDLCALC 63 10/18/2017 1447   LDLDIRECT 71 10/18/2017 1447   LDLDIRECT 156.0 08/29/2016 1523    Hepatic Function Latest Ref Rng & Units 03/23/2020 12/16/2019 08/13/2019   Total Protein 6.0 - 8.3 g/dL 6.9 6.6 6.8  Albumin 3.5 - 5.2 g/dL 4.2 4.2 4.3  AST 0 - 37 U/L 13 14 16   ALT 0 - 35 U/L 14 16 16   Alk Phosphatase 39 - 117 U/L 91 95 96  Total Bilirubin 0.2 - 1.2 mg/dL 0.6 0.5 0.6  Bilirubin, Direct 0.0 - 0.3 mg/dL 0.1 0.1 0.1    Lab Results  Component Value Date/Time   TSH 1.93 03/23/2020 03:59 PM   TSH 3.75 08/13/2019 10:15 AM    CBC Latest Ref Rng & Units 03/23/2020 12/16/2019 08/13/2019  WBC 4.0 - 10.5 K/uL 7.8 7.8 9.2  Hemoglobin 12.0 - 15.0 g/dL 14.3 14.6 14.6  Hematocrit 36.0 - 46.0 % 43.4 44.8 44.2  Platelets 150.0 - 400.0 K/uL 137.0(L) 125.0(L) 119.0(L)    Clinical ASCVD: No  The 10-year ASCVD risk score Mikey Bussing DC Jr., et al., 2013) is: 43.4%   Values used to calculate the score:     Age: 69 years     Sex: Female     Is Non-Hispanic African American: No     Diabetic: Yes     Tobacco smoker: Yes     Systolic Blood Pressure: 326 mmHg     Is BP treated: Yes     HDL Cholesterol: 42.4 mg/dL     Total Cholesterol: 137 mg/dL      Social History   Tobacco Use  Smoking Status Current Every Day Smoker  .  Packs/day: 1.00  . Years: 50.00  . Pack years: 50.00  Smokeless Tobacco Never Used   BP Readings from Last 3 Encounters:  06/02/20 126/72  06/02/20 126/72  04/05/20 132/68   Pulse Readings from Last 3 Encounters:  06/02/20 62  03/23/20 70  12/16/19 67   Wt Readings from Last 3 Encounters:  06/02/20 128 lb (58.1 kg)  06/02/20 128 lb 3.2 oz (58.2 kg)  03/23/20 134 lb 12.8 oz (61.1 kg)    Assessment: Review of patient past medical history, allergies, medications, health status, including review of consultants reports, laboratory and other test data, was performed as part of comprehensive evaluation and provision of chronic care management services.   SDOH:  (Social Determinants of Health) assessments and interventions performed:    CCM Care Plan  Allergies  Allergen Reactions  . Pioglitazone Swelling  . Macrobid  [Nitrofurantoin Macrocrystal] Other (See Comments)    Body aches  . Nitrofurantoin Other (See Comments)    Body aches    Medications Reviewed Today    Reviewed by De Hollingshead, RPH-CPP (Pharmacist) on 07/01/20 at [phone]  Med List Status: <None>  Medication Order Taking? Sig Documenting Provider Last Dose Status Informant  aspirin 81 MG tablet [id number] Yes Take 81 mg by mouth daily. [provider] Taking Active   atorvastatin (LIPITOR) 40 MG tablet [id number] Yes Take 1 tablet (40 mg total) by mouth daily. Einar Pheasant, MD Taking Active            Med Note Darnelle Maffucci, Lavonna Rua Jul 01, 2020  9:13 AM) Taking 1/2 tablet daily   blood glucose meter kit and supplies [id number] Yes Dispense based on patient and insurance preference. Use up to four times daily as directed. (FOR ICD-10 E10.9, E11.9). Einar Pheasant, MD Taking Active   carvedilol (COREG) 3.125 MG tablet [id number] Yes Take 1 tablet (3.125 mg total) by mouth 2 (two) times daily. Einar Pheasant, MD Taking Active   dapagliflozin propanediol (FARXIGA) 10 MG TABS tablet [id number] Yes Take 10 mg by mouth in the morning. [provider] Taking Active   glucose blood test strip [id number] Yes Use as instructed to blood sugar twice daily Einar Pheasant, MD Taking Active   levothyroxine (SYNTHROID) 100 MCG tablet [id number] Yes TAKE 1 TABLET(100 MCG) BY MOUTH DAILY Einar Pheasant, MD Taking Active   lisinopril (ZESTRIL) 20 MG tablet [id number] Yes Take 1 tablet (20 mg total) by mouth daily. Einar Pheasant, MD Taking Active   pantoprazole (PROTONIX) 40 MG tablet [id number] Yes Take 1 tablet (40 mg total) by mouth daily as needed. Einar Pheasant, MD Taking Active   Semaglutide,0.25 or 0.5MG/DOS, (OZEMPIC, 0.25 OR 0.5 MG/DOSE,) 2 MG/1.5ML SOPN [id number] Yes Inject 0.25 mg once weekly x 4 weeks then increase to 0.5 mg weekly Einar Pheasant, MD Taking Active            Med Note Rosana Hoes, Esmeralda Jun 02, 2020  8:02  AM) Patient is taking 0.32m weekly  triamcinolone (KENALOG) 0.1 % [id number] Apply 1 application topically 2 (two) times daily. SEinar Pheasant MD Taking Active           Patient Active Problem List   Diagnosis Date Noted  . Skin lesion 04/05/2020  . Adrenal adenoma 08/19/2019  . Breast cancer screening 08/19/2019  . Thrombocytopenia ([place] 01/27/2019  . Left wrist fracture 04/27/2018  . Cough 04/27/2018  . Abnormal EKG   . Chest pain 07/10/2017  . Renal lesion 04/10/2016  .  Aortic atherosclerosis ([place]) 06/30/2015  . Personal history of tobacco use, presenting hazards to health 06/25/2015  . Left carotid bruit 12/28/2014  . Health care maintenance 07/29/2014  . Diabetes mellitus ([place]) 03/10/2012  . Hypothyroidism 03/10/2012  . Hypercholesterolemia 03/10/2012  . Hypertension 03/10/2012  . GERD (gastroesophageal reflux disease) 03/10/2012    Immunization History  Administered Date(s) Administered  . Fluad Quad(high Dose 65+) 11/11/2019  . Influenza Split 10/16/2013  . Influenza Whole 11/24/2016  . Influenza, High Dose Seasonal PF 11/05/2017, 10/18/2018  . Influenza,inj,quad, With Preservative 11/07/2017  . Influenza-Unspecified 11/07/2012, 10/27/2014  . Moderna Sars-Covid-2 Vaccination 03/09/2019, 04/07/2019, 01/30/2020  . Pneumococcal Conjugate-13 03/02/2016  . Pneumococcal Polysaccharide-23 12/08/2012, 03/02/2017  . Tdap 10/14/2013    Conditions to be addressed/monitored: HTN, HLD and DMII  Care Plan : Medication Management  Updates made by De Hollingshead, RPH-CPP since 07/01/2020 12:00 AM    Problem: Diabetes, HLD, CKD     Long-Range Goal: Disease Progression Prevention   This Visit's Progress: On track  Recent Progress: On track  Priority: High  Note:   Current Barriers:  . Unable to independently afford treatment regimen . Unable to independently monitor therapeutic efficacy . Unable to maintain control of diabetes  Pharmacist Clinical Goal(s):   Marland Kitchen Over the next 90 days, patient will verbalize ability to afford treatment regimen . Over the next 90 days, patient will achieve control of diabetes as evidenced by A1c through collaboration with PharmD and provider.   Interventions: . 1:1 collaboration with Einar Pheasant, MD regarding development and update of comprehensive plan of care as evidenced by provider attestation and co-signature . Inter-disciplinary care team collaboration (see longitudinal plan of care) . Comprehensive medication review performed; medication list updated in electronic medical record  Medication Management: . Previously provided BID pill box to patient. Notes that she tried to use, but didn't like taking a handful of meds at once. Notes that she will sometimes forget her medications because she is so "busy" with caring for her grandchild. Reiterated importance of medication adherence for her overall health, and to avoid insurance reaching out to discuss adherence concerns with her.   Diabetes: . Uncontrolled; current treatment: Farxiga 10 mg daily, Ozempic 0.5 mg weekly o Hx metformin- stopped after a 2019 hospitalization, reason for discontinuation unclear  o Hx Tresiba - discontinued when no longer needed . No GI upset, symptoms of GU infections.  Charlton Haws from [employer] patient assistance, Ozempic from [employer] patient assistance . Current blood sugar readings: has not checked blood sugars recently . Reiterated importance of periodic home checks to ensure DM staying controlled. Advised to check glucose readings between now and when she sees PCP next month.  . Continue current regimen at this time  Hypertension: . Controlled per last clinic reading; current treatment: lisinopril 20 mg daily - though reports she is taking 10 mg daily, carvedilol 3.125 mg BID . Home BP readings: 120-130s/60-70s . Discussed insurance interest in RAAS adherence as well as overall importance for her health. Per  refill history, patient has not refilled lisinopril 20 mg yet this year, so is not falling into adherence measure (yet). Continue lisinopril 10 mg daily for now. Has ample supply until she sees cardiology in ~ 2 weeks. Will communicate w/ Dr. Fletcher Anon to advise to re-send script for lisinopril 10 mg daily if BP is adequately controlled on 10 mg dose to avoid patient falling into adherence measure . Notes she sometimes forgets lisinopril QPM. Advised to move to  QAM administration to improve adherence.   Hyperlipidemia and ASCVD risk reduction: . Controlled per last lipid panel; current treatment: atorvastatin 40 mg daily  though reports she is taking atorvastatin 20 mg daily per previously recommendation from PCP. I cannot find where this was documented. Believes she has been taking 1/2 a tablet of atorvastatin since before her last lipid panel was drawn in 03/2020 . Current antiplatelet regimen: aspirin 81 mg daily, though notes she sometimes forgets  . Discussed insurance interest in statin adherence as well as overall importance for her health. Per refill history, patient has not refilled atorvastatin 40 mg yet this year, so is not falling into adherence measure (yet). Continue atorvastatin 20 mg daily for now. Has ample supply until she sees cardiology in ~ 2 weeks. Will communicate w/ Dr. Fletcher Anon to advise to re-send script for atorvastatin 20 mg daily if LDL is adequately controlled on 10 mg dose to avoid patient falling into adherence measure  Hypothyroidism: . Controlled per last lab work; current regimen: levothyroxine 100 mcg daily . Confirmed appropriate adherence . Previously recommended to continue current regimen at this time  Tobacco Abuse: . 1 packs per day; denies being in a place to be able to work on tobacco cessation d/t stressors in her life.  . Will continue to evaluate readiness to quit and remind of health benefits and benefits for those around her.   Acid Reflux: . Controlled per  patient report; current regimen: pantoprazole 40 mg PRN . Previously recommended to continue current regimen at this time. Will discuss ability for trial of H2RA instead in the future.   Osteopenia: . DEXA 06/2020: - t score -1.4, osteopenia; FRAX calculation of risk of major osteoporotic fracture of 16%, risk of hip fracture of 4.5% hip . Advised by PCP to start calcium and vitamin D supplementation. Reiterated today.  . Consider treatment given risk of hip fracture >3%. Encouraged to discuss w/ PCP at next appointment  Supplements: . Reports current use of glucosamine PRN wrist pain w/ benefit; vitamin B12 1000 units daily . No interactions noted. Continue current regimen at this time  Patient Goals/Self-Care Activities . Over the next 90 days, patient will:  - take medications as prescribed focus on medication adherence by moving lisinopril to AM administration check glucose daily, alternating between fasting and post prandial, document, and provide at future appointments collaborate with provider on medication access solutions  Follow Up Plan: Telephone follow up appointment with care management team member scheduled for: ~ 8 weeks      Medication Assistance: [name] obtained through [facility] medication assistance program.  Enrollment ends 02/06/21. Ozempic obtained through [employer] medication assistance program.  Enrollment ends 01/06/21  Patient's preferred pharmacy is:  [facility], [place] - [address] [facility] [address] [place] [address] Phone: [phone] Fax: [phone]   Follow Up:  Patient agrees to Care Plan and Follow-up.  Plan: Telephone follow up appointment with care management team member scheduled for:  ~ 8 weeks  [name], PharmD, [place], [place] Clinical Pharmacist [employer] at [employer] [phone]

## 2020-07-15 ENCOUNTER — Other Ambulatory Visit: Payer: Self-pay

## 2020-07-15 ENCOUNTER — Other Ambulatory Visit (INDEPENDENT_AMBULATORY_CARE_PROVIDER_SITE_OTHER): Payer: Medicare Other

## 2020-07-15 DIAGNOSIS — E1165 Type 2 diabetes mellitus with hyperglycemia: Secondary | ICD-10-CM

## 2020-07-15 DIAGNOSIS — I700 Atherosclerosis of aorta: Secondary | ICD-10-CM | POA: Diagnosis not present

## 2020-07-15 DIAGNOSIS — E7800 Pure hypercholesterolemia, unspecified: Secondary | ICD-10-CM

## 2020-07-15 DIAGNOSIS — E875 Hyperkalemia: Secondary | ICD-10-CM

## 2020-07-15 DIAGNOSIS — E039 Hypothyroidism, unspecified: Secondary | ICD-10-CM

## 2020-07-15 LAB — CBC WITH DIFFERENTIAL/PLATELET
Basophils Absolute: 0.1 10*3/uL (ref 0.0–0.1)
Basophils Relative: 1 % (ref 0.0–3.0)
Eosinophils Absolute: 0.3 10*3/uL (ref 0.0–0.7)
Eosinophils Relative: 3.5 % (ref 0.0–5.0)
HCT: 41.8 % (ref 36.0–46.0)
Hemoglobin: 13.6 g/dL (ref 12.0–15.0)
Lymphocytes Relative: 43.7 % (ref 12.0–46.0)
Lymphs Abs: 3.1 10*3/uL (ref 0.7–4.0)
MCHC: 32.5 g/dL (ref 30.0–36.0)
MCV: 87.8 fl (ref 78.0–100.0)
Monocytes Absolute: 0.4 10*3/uL (ref 0.1–1.0)
Monocytes Relative: 5.8 % (ref 3.0–12.0)
Neutro Abs: 3.3 10*3/uL (ref 1.4–7.7)
Neutrophils Relative %: 46 % (ref 43.0–77.0)
Platelets: 123 10*3/uL — ABNORMAL LOW (ref 150.0–400.0)
RBC: 4.76 Mil/uL (ref 3.87–5.11)
RDW: 15.8 % — ABNORMAL HIGH (ref 11.5–15.5)
WBC: 7.1 10*3/uL (ref 4.0–10.5)

## 2020-07-15 LAB — LIPID PANEL
Cholesterol: 105 mg/dL (ref 0–200)
HDL: 32.6 mg/dL — ABNORMAL LOW (ref >39.00)
LDL Cholesterol: 40 mg/dL (ref 0–99)
NonHDL: 72.84
Total CHOL/HDL Ratio: 3
Triglycerides: 166 mg/dL — ABNORMAL HIGH (ref 0.0–149.0)
VLDL: 33.2 mg/dL (ref 0.0–40.0)

## 2020-07-15 LAB — HEMOGLOBIN A1C: Hgb A1c MFr Bld: 7.5 % — ABNORMAL HIGH (ref 4.6–6.5)

## 2020-07-15 LAB — HEPATIC FUNCTION PANEL
ALT: 19 U/L (ref 0–35)
AST: 17 U/L (ref 0–37)
Albumin: 3.9 g/dL (ref 3.5–5.2)
Alkaline Phosphatase: 78 U/L (ref 39–117)
Bilirubin, Direct: 0.1 mg/dL (ref 0.0–0.3)
Total Bilirubin: 0.4 mg/dL (ref 0.2–1.2)
Total Protein: 6.7 g/dL (ref 6.0–8.3)

## 2020-07-15 LAB — BASIC METABOLIC PANEL
BUN: 16 mg/dL (ref 6–23)
CO2: 26 mEq/L (ref 19–32)
Calcium: 9.2 mg/dL (ref 8.4–10.5)
Chloride: 105 mEq/L (ref 96–112)
Creatinine, Ser: 0.99 mg/dL (ref 0.40–1.20)
GFR: 56.11 mL/min — ABNORMAL LOW (ref >60.00)
Glucose, Bld: 153 mg/dL — ABNORMAL HIGH (ref 70–99)
Potassium: 4 mEq/L (ref 3.5–5.1)
Sodium: 139 mEq/L (ref 135–145)

## 2020-07-15 LAB — TSH: TSH: 4.72 u[IU]/mL — ABNORMAL HIGH (ref 0.35–4.50)

## 2020-07-15 NOTE — Addendum Note (Signed)
Addended by: Neta Ehlers on: 07/15/2020 07:47 AM   Modules accepted: Orders

## 2020-07-17 ENCOUNTER — Other Ambulatory Visit: Payer: Self-pay

## 2020-07-17 ENCOUNTER — Ambulatory Visit (INDEPENDENT_AMBULATORY_CARE_PROVIDER_SITE_OTHER): Payer: Medicare Other | Admitting: Cardiovascular Disease

## 2020-07-17 ENCOUNTER — Encounter: Payer: Self-pay | Admitting: Cardiovascular Disease

## 2020-07-17 ENCOUNTER — Telehealth: Payer: Self-pay | Admitting: Internal Medicine

## 2020-07-17 VITALS — BP 158/80 | HR 63 | Ht 62.0 in | Wt 125.5 lb

## 2020-07-17 DIAGNOSIS — I10 Essential (primary) hypertension: Secondary | ICD-10-CM

## 2020-07-17 DIAGNOSIS — I2510 Atherosclerotic heart disease of native coronary artery without angina pectoris: Secondary | ICD-10-CM | POA: Diagnosis not present

## 2020-07-17 DIAGNOSIS — E785 Hyperlipidemia, unspecified: Secondary | ICD-10-CM

## 2020-07-17 DIAGNOSIS — Z720 Tobacco use: Secondary | ICD-10-CM | POA: Diagnosis not present

## 2020-07-17 NOTE — Telephone Encounter (Signed)
Note sent to nurse to send confirm dose and send in rx for cholesterol medication - 20mg .

## 2020-07-17 NOTE — Patient Instructions (Signed)

## 2020-07-17 NOTE — Progress Notes (Addendum)
Cardiology Office Note   Date:  07/17/2020   ID:  Ann Collins, DOB 1945-07-08, MRN [id number]  PCP:  Einar Pheasant, MD  Cardiologist:   Kathlyn Sacramento, MD   Chief Complaint  Patient presents with   Other    12 month f/u pt is not sure why she keeps losing weight. Meds reviewed verbally with pt.       History of Present Illness: Ann Collins is a 75 y.o. female who is here today for follow-up visit regarding coronary artery disease. She has chronic medical conditions that include diabetes mellitus, hyperlipidemia,  tobacco use and hypertension. She has known history of esophageal erosions and dysphagia.   She was evaluated in June 2019 for chest pain and abnormal stress test which showed evidence of LAD ischemia with low normal ejection fraction. Cardiac catheterization in July 2019 showed left dominant system with severe two-vessel coronary artery disease.  The LAD was chronically occluded proximally with well-developed collaterals from the right coronary artery.  There was also 90% stenosis in OM 2 which was a 2 mm vessel.  Left ventricular angiography showed low normal LV systolic function with an EF of 50 to 55% with anterior and apical hypokinesis.    She was treated medically with no revascularization.  She has been doing well with no recent chest pain or worsening dyspnea.  No palpitations.  Unfortunately, she has been under significant stress as her daughter had to move in with her due to uncontrolled bipolar disorder and separation from her husband.  She is losing weight.  She continues to smoke.    Past Medical History:  Diagnosis Date   Abdominal hernia    CAD (coronary artery disease)    a. LHC 7/19: LM nl, p-mLAD 100% w/ right to left collaterals, OM2 90% (2 mm), 1st LPL 40%, medical management   Diabetes mellitus ([place])    Diverticulosis    GERD (gastroesophageal reflux disease)    Hematuria    Hypercholesterolemia    Hypertension    Hypothyroidism     Kidney stones     Past Surgical History:  Procedure Laterality Date   APPENDECTOMY     BREAST BIOPSY  1969   right   CHOLECYSTECTOMY  2005   COLONOSCOPY     COLONOSCOPY WITH PROPOFOL N/A 09/19/2017   Procedure: COLONOSCOPY WITH PROPOFOL;  Surgeon: Toledo, Benay Pike, MD;  Location: [facility] ENDOSCOPY;  Service: Gastroenterology;  Laterality: N/A;   ESOPHAGOGASTRODUODENOSCOPY (EGD) WITH PROPOFOL N/A 09/19/2017   Procedure: ESOPHAGOGASTRODUODENOSCOPY (EGD) WITH PROPOFOL;  Surgeon: Toledo, Benay Pike, MD;  Location: [facility] ENDOSCOPY;  Service: Gastroenterology;  Laterality: N/A;   LEFT HEART CATH AND CORONARY ANGIOGRAPHY Left 08/07/2017   Procedure: LEFT HEART CATH AND CORONARY ANGIOGRAPHY;  Surgeon: Wellington Hampshire, MD;  Location: [facility] [facility];  Service: Cardiovascular;  Laterality: Left;   UPPER GI ENDOSCOPY       Current Outpatient Medications  Medication Sig Dispense Refill   aspirin 81 MG tablet Take 81 mg by mouth daily.     atorvastatin (LIPITOR) 40 MG tablet Take 20 mg by mouth daily.     blood glucose meter kit and supplies Dispense based on patient and insurance preference. Use up to four times daily as directed. (FOR ICD-10 E10.9, E11.9). 1 each 3   carvedilol (COREG) 3.125 MG tablet Take 1 tablet (3.125 mg total) by mouth 2 (two) times daily. 180 tablet 1   dapagliflozin propanediol (FARXIGA) 10 MG TABS tablet Take 10  mg by mouth in the morning.     glucose blood test strip Use as instructed to blood sugar twice daily 100 each 12   levothyroxine (SYNTHROID) 100 MCG tablet TAKE 1 TABLET(100 MCG) BY MOUTH DAILY 90 tablet 1   lisinopril (ZESTRIL) 20 MG tablet Take 10 mg by mouth daily.     pantoprazole (PROTONIX) 40 MG tablet Take 1 tablet (40 mg total) by mouth daily as needed. 90 tablet 1   Semaglutide (OZEMPIC, 0.25 OR 0.5 MG/DOSE, [place]) Inject into the skin once a week.     triamcinolone (KENALOG) 0.1 % Apply 1 application topically 2 (two) times daily. 30 g 0   No current  facility-administered medications for this visit.    Allergies:   Pioglitazone, Macrobid [nitrofurantoin macrocrystal], and Nitrofurantoin    Social History:  The patient  reports that she has been smoking. She has a 50.00 pack-year smoking history. She has never used smokeless tobacco. She reports that she does not drink alcohol and does not use drugs.   Family History:  The patient's family history includes Alcohol abuse in her son; Bipolar disorder in her son; Brain cancer in her mother; Diabetes in her brother; Lung disease in her father.    ROS:  Please see the history of present illness.   Otherwise, review of systems are positive for none.   All other systems are reviewed and negative.    PHYSICAL EXAM: VS:  BP (!) 158/80 (BP Location: Left Arm, Patient Position: Sitting, Cuff Size: Normal)   Pulse 63   Ht 5' 2"  (1.575 m)   Wt 125 lb 8 oz (56.9 kg)   LMP 03/10/1991   SpO2 97%   BMI 22.95 kg/m  , BMI Body mass index is 22.95 kg/m. GEN: Well nourished, well developed, in no acute distress  HEENT: normal  Neck: no JVD, carotid bruits, or masses Cardiac: RRR; no murmurs, rubs, or gallops,no edema  Respiratory:  clear to auscultation bilaterally, normal work of breathing GI: soft, nontender, nondistended, + BS MS: no deformity or atrophy  Skin: warm and dry, no rash Neuro:  Strength and sensation are intact Psych: euthymic mood, full affect Distal pulses are palpable.   EKG:  EKG is ordered today. The ekg ordered today demonstrates normal sinus rhythm with no significant ST or T wave changes.   Recent Labs: 07/15/2020: ALT 19; BUN 16; Creatinine, Ser 0.99; Hemoglobin 13.6; Platelets 123.0; Potassium 4.0; Sodium 139; TSH 4.72    Lipid Panel    Component Value Date/Time   CHOL 105 07/15/2020 0733   CHOL 134 10/18/2017 1447   TRIG 166.0 (H) 07/15/2020 0733   HDL 32.60 (L) 07/15/2020 0733   HDL 41 10/18/2017 1447   CHOLHDL 3 07/15/2020 0733   VLDL 33.2 07/15/2020  0733   LDLCALC 40 07/15/2020 0733   LDLCALC 63 10/18/2017 1447   LDLDIRECT 71 10/18/2017 1447   LDLDIRECT 156.0 08/29/2016 1523      Wt Readings from Last 3 Encounters:  07/17/20 125 lb 8 oz (56.9 kg)  06/02/20 128 lb (58.1 kg)  06/02/20 128 lb 3.2 oz (58.2 kg)       PAD Screen 07/21/2017  Previous PAD dx? No  Previous surgical procedure? No  Pain with walking? No  Feet/toe relief with dangling? No  Painful, non-healing ulcers? No  Extremities discolored? No      ASSESSMENT AND PLAN:  1.  Coronary artery disease involving native coronary arteries without angina: She is overall doing well with  no anginal symptoms.  I recommend continuing medical therapy.  2.  Hyperlipidemia: She is taking 40 mg of atorvastatin daily.  I reviewed her recent lipid profile which showed an LDL of 40.  3.  Tobacco use: I again discussed with her the importance of smoking cessation.  She reports inability to quit .  4.  Essential hypertension: Blood pressures well controlled at home and also well controlled during her recent visit with Dr. Nicki Reaper.  It is elevated today in our office but this seems to be an outlier.  Continue same medications.    Disposition:   FU with me in 12 months   Signed, Kathlyn Sacramento, MD 07/17/20 [place], [place]

## 2020-07-20 ENCOUNTER — Other Ambulatory Visit: Payer: Self-pay | Admitting: Internal Medicine

## 2020-07-20 ENCOUNTER — Other Ambulatory Visit: Payer: Self-pay

## 2020-07-20 MED ORDER — ATORVASTATIN CALCIUM 20 MG PO TABS: 20.0000 mg | ORAL_TABLET | Freq: Every day | ORAL | 3 refills | Status: DC

## 2020-07-20 MED ORDER — LEVOTHYROXINE SODIUM 112 MCG PO TABS: 112.0000 ug | ORAL_TABLET | Freq: Every day | ORAL | 1 refills | Status: DC

## 2020-07-28 ENCOUNTER — Encounter: Payer: Self-pay | Admitting: Internal Medicine

## 2020-07-28 ENCOUNTER — Ambulatory Visit (INDEPENDENT_AMBULATORY_CARE_PROVIDER_SITE_OTHER): Payer: Medicare Other | Admitting: Internal Medicine

## 2020-07-28 ENCOUNTER — Other Ambulatory Visit: Payer: Self-pay

## 2020-07-28 VITALS — BP 122/60 | HR 61 | Temp 96.2°F | Resp 16 | Ht 62.0 in | Wt 125.0 lb

## 2020-07-28 DIAGNOSIS — W19XXXA Unspecified fall, initial encounter: Secondary | ICD-10-CM | POA: Diagnosis not present

## 2020-07-28 DIAGNOSIS — Z87891 Personal history of nicotine dependence: Secondary | ICD-10-CM | POA: Diagnosis not present

## 2020-07-28 DIAGNOSIS — E1165 Type 2 diabetes mellitus with hyperglycemia: Secondary | ICD-10-CM

## 2020-07-28 DIAGNOSIS — Z1211 Encounter for screening for malignant neoplasm of colon: Secondary | ICD-10-CM

## 2020-07-28 DIAGNOSIS — E7800 Pure hypercholesterolemia, unspecified: Secondary | ICD-10-CM | POA: Diagnosis not present

## 2020-07-28 DIAGNOSIS — I700 Atherosclerosis of aorta: Secondary | ICD-10-CM | POA: Diagnosis not present

## 2020-07-28 DIAGNOSIS — I10 Essential (primary) hypertension: Secondary | ICD-10-CM

## 2020-07-28 DIAGNOSIS — K2100 Gastro-esophageal reflux disease with esophagitis, without bleeding: Secondary | ICD-10-CM | POA: Diagnosis not present

## 2020-07-28 DIAGNOSIS — R634 Abnormal weight loss: Secondary | ICD-10-CM

## 2020-07-28 DIAGNOSIS — E039 Hypothyroidism, unspecified: Secondary | ICD-10-CM

## 2020-07-28 DIAGNOSIS — I2510 Atherosclerotic heart disease of native coronary artery without angina pectoris: Secondary | ICD-10-CM | POA: Diagnosis not present

## 2020-07-28 DIAGNOSIS — D696 Thrombocytopenia, unspecified: Secondary | ICD-10-CM | POA: Diagnosis not present

## 2020-07-28 NOTE — Progress Notes (Signed)
Patient ID: Ann Collins, female   DOB: 1945/12/03, 75 y.o.   MRN: [id number]   Subjective:    Patient ID: Ann Collins, female    DOB: 12-16-1945, 74 y.o.   MRN: [id number]  HPI This visit occurred during the SARS-CoV-2 public health emergency.  Safety protocols were in place, including screening questions prior to the visit, additional usage of staff PPE, and extensive cleaning of exam room while observing appropriate contact time as indicated for disinfecting solutions.   Patient here for a scheduled follow up.  Here to follow up regarding her blood sugars, cholesterol and blood pressure.  She is doing relatively well.  Increased stress.  Family issues.  Her daughter has moved back in with her.  Discussed with her.  Overall she feels she is handling things relatively well.  Does not feel needs any further intervention.  No chest pain or sob reported.  No abdominal pain.  States her bowels are doing better.  Weight stable from last check.  She is eating.  Had colonoscopy 2019.  Still smoking.  Desires not to quit at this time.  Had a fall last night.  No head injury.  Abrasion - elbow.  No other pain or residual problems.     Past Medical History:  Diagnosis Date   Abdominal hernia    CAD (coronary artery disease)    a. LHC 7/19: LM nl, p-mLAD 100% w/ right to left collaterals, OM2 90% (2 mm), 1st LPL 40%, medical management   Diabetes mellitus ([place])    Diverticulosis    GERD (gastroesophageal reflux disease)    Hematuria    Hypercholesterolemia    Hypertension    Hypothyroidism    Kidney stones    Past Surgical History:  Procedure Laterality Date   APPENDECTOMY     BREAST BIOPSY  1969   right   CHOLECYSTECTOMY  2005   COLONOSCOPY     COLONOSCOPY WITH PROPOFOL N/A 09/19/2017   Procedure: COLONOSCOPY WITH PROPOFOL;  Surgeon: Toledo, Benay Pike, MD;  Location: [facility];  Service: Gastroenterology;  Laterality: N/A;   ESOPHAGOGASTRODUODENOSCOPY (EGD) WITH PROPOFOL N/A  09/19/2017   Procedure: ESOPHAGOGASTRODUODENOSCOPY (EGD) WITH PROPOFOL;  Surgeon: Toledo, Benay Pike, MD;  Location: [facility];  Service: Gastroenterology;  Laterality: N/A;   LEFT HEART CATH AND CORONARY ANGIOGRAPHY Left 08/07/2017   Procedure: LEFT HEART CATH AND CORONARY ANGIOGRAPHY;  Surgeon: Wellington Hampshire, MD;  Location: [facility];  Service: Cardiovascular;  Laterality: Left;   UPPER GI ENDOSCOPY     Family History  Problem Relation Age of Onset   Brain cancer Mother        history of brain tumor   Lung disease Father    Diabetes Brother        x2   Bipolar disorder Son    Alcohol abuse Son    Breast cancer Neg Hx    Colon cancer Neg Hx    Social History   Socioeconomic History   Marital status: Married    Spouse name: Not on file   Number of children: Not on file   Years of education: Not on file   Highest education level: Not on file  Occupational History   Not on file  Tobacco Use   Smoking status: Every Day    Packs/day: 1.00    Years: 50.00    Pack years: 50.00    Types: Cigarettes   Smokeless tobacco: Never  Vaping Use   Vaping Use: Never  used  Substance and Sexual Activity   Alcohol use: No    Alcohol/week: 0.0 standard drinks   Drug use: No   Sexual activity: Not on file  Other Topics Concern   Not on file  Social History Narrative   Not on file   Social Determinants of Health   Financial Resource Strain: Medium Risk   Difficulty of Paying Living Expenses: Somewhat hard  Food Insecurity: No Food Insecurity   Worried About Running Out of Food in the Last Year: Never true   Ran Out of Food in the Last Year: Never true  Transportation Needs: No Transportation Needs   Lack of Transportation (Medical): No   Lack of Transportation (Non-Medical): No  Physical Activity: Insufficiently Active   Days of Exercise per Week: 3 days   Minutes of Exercise per Session: 30 min  Stress: Stress Concern Present   Feeling of Stress : Rather much   Social Connections: Unknown   Frequency of Communication with Friends and Family: Not on file   Frequency of Social Gatherings with Friends and Family: Not on file   Attends Religious Services: Not on file   Active Member of Clubs or Organizations: Not on file   Attends Archivist Meetings: Not on file   Marital Status: Married    Outpatient Encounter Medications as of 07/28/2020  Medication Sig   aspirin 81 MG tablet Take 81 mg by mouth daily.   atorvastatin (LIPITOR) 20 MG tablet Take 1 tablet (20 mg total) by mouth daily.   blood glucose meter kit and supplies Dispense based on patient and insurance preference. Use up to four times daily as directed. (FOR ICD-10 E10.9, E11.9).   carvedilol (COREG) 3.125 MG tablet Take 1 tablet (3.125 mg total) by mouth 2 (two) times daily.   dapagliflozin propanediol (FARXIGA) 10 MG TABS tablet Take 10 mg by mouth in the morning.   glucose blood test strip Use as instructed to blood sugar twice daily   levothyroxine (SYNTHROID) 112 MCG tablet Take 1 tablet (112 mcg total) by mouth daily before breakfast.   lisinopril (ZESTRIL) 20 MG tablet Take 10 mg by mouth daily.   pantoprazole (PROTONIX) 40 MG tablet Take 1 tablet (40 mg total) by mouth daily as needed.   Semaglutide (OZEMPIC, 0.25 OR 0.5 MG/DOSE, [place]) Inject into the skin once a week.   triamcinolone (KENALOG) 0.1 % Apply 1 application topically 2 (two) times daily.   No facility-administered encounter medications on file as of 07/28/2020.     Review of Systems  Constitutional:  Negative for appetite change and unexpected weight change.  HENT:  Negative for congestion and sinus pressure.   Respiratory:  Negative for cough, chest tightness and shortness of breath.   Cardiovascular:  Negative for chest pain, palpitations and leg swelling.  Gastrointestinal:  Negative for abdominal pain, diarrhea, nausea and vomiting.  Genitourinary:  Negative for difficulty urinating and dysuria.   Musculoskeletal:  Negative for joint swelling and myalgias.  Skin:  Negative for color change and rash.  Neurological:  Negative for dizziness, light-headedness and headaches.  Psychiatric/Behavioral:  Negative for agitation and dysphoric mood.        Increased stress as outlined.        Objective:    Physical Exam Vitals reviewed.  Constitutional:      General: She is not in acute distress.    Appearance: Normal appearance.  HENT:     Head: Normocephalic and atraumatic.     Right Ear:  External ear normal.     Left Ear: External ear normal.  Eyes:     General: No scleral icterus.       Right eye: No discharge.        Left eye: No discharge.     Conjunctiva/sclera: Conjunctivae normal.  Neck:     Thyroid: No thyromegaly.  Cardiovascular:     Rate and Rhythm: Normal rate and regular rhythm.  Pulmonary:     Effort: No respiratory distress.     Breath sounds: Normal breath sounds. No wheezing.  Abdominal:     General: Bowel sounds are normal.     Palpations: Abdomen is soft.     Tenderness: There is no abdominal tenderness.  Musculoskeletal:        General: No swelling or tenderness.     Cervical back: Neck supple. No tenderness.  Lymphadenopathy:     Cervical: No cervical adenopathy.  Skin:    Findings: No erythema or rash.  Neurological:     Mental Status: She is alert.  Psychiatric:        Mood and Affect: Mood normal.        Behavior: Behavior normal.    BP 122/60   Pulse 61   Temp (!) 96.2 F (35.7 C) (Temporal)   Resp 16   Ht 5' 2"  (1.575 m)   Wt 125 lb (56.7 kg)   LMP 03/10/1991   SpO2 98%   BMI 22.86 kg/m  Wt Readings from Last 3 Encounters:  07/28/20 125 lb (56.7 kg)  07/17/20 125 lb 8 oz (56.9 kg)  06/02/20 128 lb (58.1 kg)     Lab Results  Component Value Date   WBC 7.1 07/15/2020   HGB 13.6 07/15/2020   HCT 41.8 07/15/2020   PLT 123.0 (L) 07/15/2020   GLUCOSE 153 (H) 07/15/2020   CHOL 105 07/15/2020   TRIG 166.0 (H) 07/15/2020   HDL  32.60 (L) 07/15/2020   LDLDIRECT 71 10/18/2017   LDLCALC 40 07/15/2020   ALT 19 07/15/2020   AST 17 07/15/2020   NA 139 07/15/2020   K 4.0 07/15/2020   CL 105 07/15/2020   CREATININE 0.99 07/15/2020   BUN 16 07/15/2020   CO2 26 07/15/2020   TSH 4.72 (H) 07/15/2020   INR 0.87 08/07/2017   HGBA1C 7.5 (H) 07/15/2020   MICROALBUR <0.7 08/13/2019    DG Bone Density  Result Date: 06/15/2020 EXAM: DUAL X-RAY ABSORPTIOMETRY (DXA) FOR BONE MINERAL DENSITY IMPRESSION: Your patient Lizzete Gough completed a BMD test on 06/15/2020 using the [place] (software version: 14.10) manufactured by [employer]. The following summarizes the results of our evaluation. Technologist: SCE PATIENT BIOGRAPHICAL: Name: Masyn, Fullam Patient ID: [id number] Birth Date: March 16, 1945 Height: 62.0 in. Gender: Female Exam Date: 06/15/2020 Weight: 127.1 lbs. Indications: Advanced Age, Diabetic, Early Menopause, History of Fracture (Adult), Hypothyroid, Postmenopausal, Tobacco User (Current Smoker) Fractures: fingers, Left wrist, Toes Treatments: Farxiga, Levothyroxine, Ozempic Injection DENSITOMETRY RESULTS: Site      Region    Measured Date Measured Age WHO Classification Young Adult T-score BMD         %Change vs. Previous Significant Change (*) AP Spine L1-L3 06/15/[age] 74.5 Normal 0.9 1.296 g/cm2 - - DualFemur Neck Left 06/15/2020 74.5 Osteopenia -1.4 0.850 g/cm2 - - ASSESSMENT: The BMD measured at Femur Neck Left is 0.850 g/cm2 with a T-score of -1.4. This patient is considered osteopenic according to [facility]) criteria. The scan quality is good.  L4 was excluded due to degenerative changes. World Pharmacologist [facility]) criteria for post-menopausal, Caucasian Women: Normal:                   T-score at or above -1 SD Osteopenia/low bone mass: T-score between -1 and -2.5 SD Osteoporosis:             T-score at or below -2.5 SD RECOMMENDATIONS: 1. All patients should optimize  calcium and vitamin D intake. 2. Consider FDA-approved medical therapies in postmenopausal women and men aged 12 years and older, based on the following: a. A hip or vertebral(clinical or morphometric) fracture b. T-score < -2.5 at the femoral neck or spine after appropriate evaluation to exclude secondary causes c. Low bone mass (T-score between -1.0 and -2.5 at the femoral neck or spine) and a 10-year probability of a hip fracture > 3% or a 10-year probability of a major osteoporosis-related fracture > 20% based on the [place]-adapted WHO algorithm 3. Clinician judgment and/or patient preferences may indicate treatment for people with 10-year fracture probabilities above or below these levels FOLLOW-UP: People with diagnosed cases of osteoporosis or at high risk for fracture should have regular bone mineral density tests. For patients eligible for Medicare, routine testing is allowed once every 2 years. The testing frequency can be increased to one year for patients who have rapidly progressing disease, those who are receiving or discontinuing medical therapy to restore bone mass, or have additional risk factors. I have reviewed this report, and agree with the above findings. [facility] Dear Dr Nicki Reaper, Your patient SVETLANA BAGBY completed a FRAX assessment on 06/15/2020 using the [place] (analysis version: 14.10) manufactured by [employer]. The following summarizes the results of our evaluation. PATIENT BIOGRAPHICAL: Name: Deshay, Kirstein Patient ID: [id number] Birth Date: 12-29-1945 Height:    62.0 in. Gender:     [age]    [age]       Weight:    127.1 lbs. Ethnicity:  White                            Exam Date: 06/15/2020 FRAX* RESULTS:  (version: 3.5) 10-year Probability of Fracture1 Major Osteoporotic Fracture2 Hip Fracture 16.1% 4.4% Population: [place] (Caucasian) Risk Factors: History of Fracture (Adult), Tobacco User (Current Smoker) Based on Femur (Left) Neck BMD 1 -The  10-year probability of fracture may be lower than reported if the patient has received treatment. 2 -Major Osteoporotic Fracture: Clinical Spine, Forearm, Hip or Shoulder *FRAX is a Materials engineer of the [employer] for Metabolic Bone Disease, a [place] (WHO) [facility]. ASSESSMENT: The probability of a major osteoporotic fracture is 16.1% within the next ten years. The probability of a hip fracture is 4.4% within the next ten years. . I have reviewed this report and agree with the above findings. Mark A. Thornton Papas, M.D. [facility] Electronically Signed   By: Lavonia Dana M.D.   On: 06/15/2020 16:21   MM 3D SCREEN BREAST BILATERAL  Result Date: 06/15/2020 CLINICAL DATA:  Screening. EXAM: DIGITAL SCREENING BILATERAL MAMMOGRAM WITH TOMOSYNTHESIS AND CAD TECHNIQUE: Bilateral screening digital craniocaudal and mediolateral oblique mammograms were obtained. Bilateral screening digital breast tomosynthesis was performed. The images were evaluated with computer-aided detection. COMPARISON:  Previous exam(s). ACR Breast Density Category b: There are scattered areas of fibroglandular density. FINDINGS: There are no findings suspicious  for malignancy. The images were evaluated with computer-aided detection. IMPRESSION: No mammographic evidence of malignancy. A result letter of this screening mammogram will be mailed directly to the patient. RECOMMENDATION: Screening mammogram in one year. (Code:SM-B-01Y) BI-RADS CATEGORY  1: Negative. Electronically Signed   By: Audie Pinto M.D.   On: 06/15/2020 15:26       Assessment & Plan:   Problem List Items Addressed This Visit     Aortic atherosclerosis ([place])    Continue lipitor.        Colon cancer screening    Colonoscopy 09/2017 - internal hemorrhoids and 2 3-20m polyps.  Continue f/u with GI.         Diabetes mellitus ([place]    On farxiga and ozempic.  Weight stable from recent check, but down  a few pounds from 05/2020 check.  Discussed weight loss.  States she is eating.  Denies any nausea or vomiting.  No bowel change reported.  Discussed recent a1c - 7.5.  Reviewed recent sugar checks - AM sugars 130-150 and PM sugars 130-160.  Appears to be doing better on these checks.  Discussed adjusting medication.  She prefers to continue her current medication regimen and follow sugars.  Follow met b and a1c.        Fall    Fall as outlined.  Elbow - abrasion.  Area is clean.  Keep bandage on initially.  No evidence of infection.  Follow.         GERD (gastroesophageal reflux disease)    No upper symptoms reported. On protonix.        Hypercholesterolemia    Continue lipitor.  Follow lipid panel and liver function tests.         Hypertension    Continue lisinopril.  Blood pressure as outlined.  Hold on making any changes. Follow pressures.  Follow metabolic panel.        Hypothyroidism - Primary    On thyroid replacement.  Follow tsh.       Relevant Orders   TSH   Personal history of tobacco use, presenting hazards to health    Discussed the need to quit.  Declines to quit at this time - increased stress.         Thrombocytopenia ([place]    Follow cbc.        Weight loss    Weight loss as outlined. She is eating.  Colonoscopy 2019.  Continues to smoke.  F/u chest CT.  Following routine labs.  Encouraged increased po intake.  She feels is related to increased stress.  Desires no further intervention.  Follow.           CEinar Pheasant MD

## 2020-07-29 DIAGNOSIS — Z23 Encounter for immunization: Secondary | ICD-10-CM | POA: Diagnosis not present

## 2020-08-03 DIAGNOSIS — Z1211 Encounter for screening for malignant neoplasm of colon: Secondary | ICD-10-CM | POA: Insufficient documentation

## 2020-08-03 DIAGNOSIS — W19XXXA Unspecified fall, initial encounter: Secondary | ICD-10-CM | POA: Insufficient documentation

## 2020-08-03 DIAGNOSIS — R634 Abnormal weight loss: Secondary | ICD-10-CM | POA: Insufficient documentation

## 2020-08-03 NOTE — Assessment & Plan Note (Addendum)
On thyroid replacement.  Follow tsh.  

## 2020-08-03 NOTE — Assessment & Plan Note (Signed)
Weight loss as outlined. She is eating.  Colonoscopy 2019.  Continues to smoke.  F/u chest CT.  Following routine labs.  Encouraged increased po intake.  She feels is related to increased stress.  Desires no further intervention.  Follow.

## 2020-08-03 NOTE — Assessment & Plan Note (Signed)
Continue lisinopril.  Blood pressure as outlined.  Hold on making any changes. Follow pressures.  Follow metabolic panel.

## 2020-08-03 NOTE — Assessment & Plan Note (Signed)
Continue lipitor.  Follow lipid panel and liver function tests.   

## 2020-08-03 NOTE — Assessment & Plan Note (Signed)
Continue lipitor  ?

## 2020-08-03 NOTE — Assessment & Plan Note (Signed)
Follow cbc.  

## 2020-08-03 NOTE — Assessment & Plan Note (Signed)
No upper symptoms reported.  On protonix.   

## 2020-08-03 NOTE — Assessment & Plan Note (Signed)
On farxiga and ozempic.  Weight stable from recent check, but down a few pounds from 05/2020 check.  Discussed weight loss.  States she is eating.  Denies any nausea or vomiting.  No bowel change reported.  Discussed recent a1c - 7.5.  Reviewed recent sugar checks - AM sugars 130-150 and PM sugars 130-160.  Appears to be doing better on these checks.  Discussed adjusting medication.  She prefers to continue her current medication regimen and follow sugars.  Follow met b and a1c.

## 2020-08-03 NOTE — Assessment & Plan Note (Signed)
Fall as outlined.  Elbow - abrasion.  Area is clean.  Keep bandage on initially.  No evidence of infection.  Follow.

## 2020-08-03 NOTE — Assessment & Plan Note (Signed)
Colonoscopy 09/2017 - internal hemorrhoids and 2 3-4mm polyps.  Continue f/u with GI.  

## 2020-08-03 NOTE — Assessment & Plan Note (Signed)
Discussed the need to quit.  Declines to quit at this time - increased stress.

## 2020-08-14 ENCOUNTER — Telehealth: Payer: Self-pay

## 2020-08-14 ENCOUNTER — Telehealth: Payer: Self-pay | Admitting: Internal Medicine

## 2020-08-14 NOTE — Telephone Encounter (Signed)
PT came into the office to drop off a letter for [place]. It is in the front in the color envelope.

## 2020-08-14 NOTE — Telephone Encounter (Signed)
I called ad informed patient that her patient assistance medication was ready to pick up & needed to be today ASAP.   5 boxes 2mg /1.64mL Ozempic 2 boxes Novofine pen needles  Pt stated that she wold be by today to pick up. Placed in refrigerator.

## 2020-08-14 NOTE — Telephone Encounter (Signed)
Pt picked up patient assistance medication.

## 2020-08-16 DIAGNOSIS — Z20822 Contact with and (suspected) exposure to covid-19: Secondary | ICD-10-CM | POA: Diagnosis not present

## 2020-08-17 NOTE — Telephone Encounter (Signed)
Received the patients Blood Sugar Log and placed in the Providers Results Folder.

## 2020-08-17 NOTE — Telephone Encounter (Signed)
Reviewed sugars.  Last a1c 7.5.  recorded sugars appear to be improved.  Continue low carb diet.  Continue to follow sugars.

## 2020-08-18 ENCOUNTER — Other Ambulatory Visit: Payer: Self-pay

## 2020-08-18 MED ORDER — GLUCOSE BLOOD VI STRP: ORAL_STRIP | 12 refills | Status: DC

## 2020-08-18 NOTE — Telephone Encounter (Signed)
Called and spoke with Ann Collins. Ann Collins verbalized understanding and had no further questions. Ann Collins requested a refill of her blood glucose test strips. They have been refilled and sent to the pharmacy on file.

## 2020-09-07 ENCOUNTER — Ambulatory Visit (INDEPENDENT_AMBULATORY_CARE_PROVIDER_SITE_OTHER): Payer: Medicare Other | Admitting: Pharmacist

## 2020-09-07 DIAGNOSIS — I10 Essential (primary) hypertension: Secondary | ICD-10-CM | POA: Diagnosis not present

## 2020-09-07 DIAGNOSIS — E7800 Pure hypercholesterolemia, unspecified: Secondary | ICD-10-CM

## 2020-09-07 DIAGNOSIS — E1165 Type 2 diabetes mellitus with hyperglycemia: Secondary | ICD-10-CM | POA: Diagnosis not present

## 2020-09-07 MED ORDER — LISINOPRIL 10 MG PO TABS: 10.0000 mg | ORAL_TABLET | Freq: Every day | ORAL | 3 refills | Status: DC

## 2020-09-07 NOTE — Chronic Care Management (AMB) (Signed)
Chronic Care Management Pharmacy Note  09/07/2020 Name:  Ann Collins MRN:  [id number] DOB:  [birth year] Subjective: Ann Collins is an 75 y.o. year old female who is a primary patient of Ann Pheasant, MD.  The CCM team was consulted for assistance with disease management and care coordination needs.    Engaged with patient by telephone for follow up visit in response to provider referral for pharmacy case management and/or care coordination services.   Consent to Services:  The patient was given information about Chronic Care Management services, agreed to services, and gave verbal consent prior to initiation of services.  Please see initial visit note for detailed documentation.   Patient Care Team: Ann Pheasant, MD as PCP - General (Internal Medicine) Wellington Hampshire, MD as PCP - Cardiology (Cardiology) De Hollingshead, RPH-CPP as Pharmacist (Pharmacist)   Objective:  Lab Results  Component Value Date   CREATININE 0.99 07/15/2020   CREATININE 1.12 03/23/2020   CREATININE 1.16 12/16/2019    Lab Results  Component Value Date   HGBA1C 7.5 (H) 07/15/2020   Last diabetic Eye exam:  Lab Results  Component Value Date/Time   HMDIABEYEEXA No Retinopathy 06/22/2020 12:00 AM    Last diabetic Foot exam:  Lab Results  Component Value Date/Time   HMDIABFOOTEX on my exam 08/13/2019 12:00 AM        Component Value Date/Time   CHOL 105 07/15/2020 0733   CHOL 134 10/18/2017 1447   TRIG 166.0 (H) 07/15/2020 0733   HDL 32.60 (L) 07/15/2020 0733   HDL 41 10/18/2017 1447   CHOLHDL 3 07/15/2020 0733   VLDL 33.2 07/15/2020 0733   LDLCALC 40 07/15/2020 0733   LDLCALC 63 10/18/2017 1447   LDLDIRECT 71 10/18/2017 1447   LDLDIRECT 156.0 08/29/2016 1523    Hepatic Function Latest Ref Rng & Units 07/15/2020 03/23/2020 12/16/2019  Total Protein 6.0 - 8.3 g/dL 6.7 6.9 6.6  Albumin 3.5 - 5.2 g/dL 3.9 4.2 4.2  AST 0 - 37 U/L _0 ALT 0 - 35 U/L _1 Alk  Phosphatase 39 - 117 U/L 78 91 95  Total Bilirubin 0.2 - 1.2 mg/dL 0.4 0.6 0.5  Bilirubin, Direct 0.0 - 0.3 mg/dL 0.1 0.1 0.1    Lab Results  Component Value Date/Time   TSH 4.72 (H) 07/15/2020 07:33 AM   TSH 1.93 03/23/2020 03:59 PM    CBC Latest Ref Rng & Units 07/15/2020 03/23/2020 12/16/2019  WBC 4.0 - 10.5 K/uL 7.1 7.8 7.8  Hemoglobin 12.0 - 15.0 g/dL 13.6 14.3 14.6  Hematocrit 36.0 - 46.0 % 41.8 43.4 44.8  Platelets 150.0 - 400.0 K/uL 123.0(L) 137.0(L) 125.0(L)    Clinical ASCVD: No  The ASCVD Risk score Ann Bussing DC Jr., et al., 2013) failed to calculate for the following reasons:   The valid total cholesterol range is 130 to 320 mg/dL     Social History   Tobacco Use  Smoking Status Every Day   Packs/day: 1.00   Years: 50.00   Pack years: 50.00   Types: Cigarettes  Smokeless Tobacco Never   BP Readings from Last 3 Encounters:  07/28/20 122/60  07/17/20 (!) 158/80  06/02/20 126/72   Pulse Readings from Last 3 Encounters:  07/28/20 61  07/17/20 63  06/02/20 62   Wt Readings from Last 3 Encounters:  07/28/20 125 lb (56.7 kg)  07/17/20 125 lb 8 oz (56.9 kg)  06/02/20 128 lb (58.1 kg)  Assessment: Review of patient past medical history, allergies, medications, health status, including review of consultants reports, laboratory and other test data, was performed as part of comprehensive evaluation and provision of chronic care management services.   SDOH:  (Social Determinants of Health) assessments and interventions performed:  SDOH Interventions    Flowsheet Row Most Recent Value  SDOH Interventions   SDOH Interventions for the Following Domains Tobacco  Financial Strain Interventions Other (Comment)  [manufacturer assistance]  Stress Interventions Provide Counseling  Tobacco Interventions Cessation Materials Given and Reviewed       CCM Care Plan  Allergies  Allergen Reactions   Pioglitazone Swelling   Macrobid [Nitrofurantoin Macrocrystal] Other  (See Comments)    Body aches   Nitrofurantoin Other (See Comments)    Body aches    Medications Reviewed Today     Reviewed by De Hollingshead, RPH-CPP (Pharmacist) on 09/07/20 at [phone]  Med List Status: <None>   Medication Order Taking? Sig Documenting Provider Last Dose Status Informant  aspirin 81 MG tablet [id number] Yes Take 81 mg by mouth daily. [provider] Taking Active   atorvastatin (LIPITOR) 20 MG tablet [id number] Yes Take 1 tablet (20 mg total) by mouth daily. Ann Pheasant, MD Taking Active   blood glucose meter kit and supplies [id number] Yes Dispense based on patient and insurance preference. Use up to four times daily as directed. (FOR ICD-10 E10.9, E11.9). Ann Pheasant, MD Taking Active   carvedilol (COREG) 3.125 MG tablet [id number] Yes Take 1 tablet (3.125 mg total) by mouth 2 (two) times daily. Ann Pheasant, MD Taking Active   dapagliflozin propanediol (FARXIGA) 10 MG TABS tablet [id number] Yes Take 10 mg by mouth in the morning. [provider] Taking Active   glucose blood test strip [id number] Yes Use as instructed to blood sugar twice daily Ann Pheasant, MD Taking Active   levothyroxine (SYNTHROID) 112 MCG tablet [id number] Yes Take 1 tablet (112 mcg total) by mouth daily before breakfast. Ann Pheasant, MD Taking Active   lisinopril (ZESTRIL) 20 MG tablet [id number] Yes Take 10 mg by mouth daily. [provider] Taking Active   pantoprazole (PROTONIX) 40 MG tablet [id number] Yes Take 1 tablet (40 mg total) by mouth daily as needed. Ann Pheasant, MD Taking Active   Semaglutide [facility], 0.25 OR 0.5 MG/DOSE, [place]) [id number] Yes Inject 0.5 mg into the skin once a week. [provider] Taking Active   triamcinolone (KENALOG) 0.1 % [id number] Yes Apply 1 application topically 2 (two) times daily. Ann Pheasant, MD Taking Active             Patient Active Problem List   Diagnosis Date Noted   Colon cancer screening  08/03/2020   Weight loss 08/03/2020   Fall 08/03/2020   Skin lesion 04/05/2020   Adrenal adenoma 08/19/2019   Breast cancer screening 08/19/2019   Thrombocytopenia ([place]) 01/27/2019   Left wrist fracture 04/27/2018   Cough 04/27/2018   Abnormal EKG    Chest pain 07/10/2017   Renal lesion 04/10/2016   Aortic atherosclerosis ([place]) 06/30/2015   Personal history of tobacco use, presenting hazards to health 06/25/2015   Left carotid bruit 12/28/2014   Health care maintenance 07/29/2014   Diabetes mellitus ([place]) 03/10/2012   Hypothyroidism 03/10/2012   Hypercholesterolemia 03/10/2012   Hypertension 03/10/2012   GERD (gastroesophageal reflux disease) 03/10/2012    Immunization History  Administered Date(s) Administered   Fluad Quad(high Dose 65+) 11/11/2019   Influenza Split 10/16/2013   Influenza Whole 11/24/2016  Influenza, High Dose Seasonal PF 11/05/2017, 10/18/2018   Influenza,inj,quad, With Preservative 11/07/2017   Influenza-Unspecified 11/07/2012, 10/27/2014   Moderna Sars-Covid-2 Vaccination 03/09/2019, 04/07/2019, 01/30/2020   Pneumococcal Conjugate-13 03/02/2016   Pneumococcal Polysaccharide-23 12/08/2012, 03/02/2017   Tdap 10/14/2013    Conditions to be addressed/monitored: HTN, HLD, and DMII  Care Plan : Medication Management  Updates made by De Hollingshead, RPH-CPP since 09/07/2020 12:00 AM     Problem: Diabetes, HLD, CKD      Long-Range Goal: Disease Progression Prevention   This Visit's Progress: On track  Recent Progress: On track  Priority: High  Note:   Current Barriers:  Unable to independently afford treatment regimen Unable to independently monitor therapeutic efficacy Unable to maintain control of diabetes  Pharmacist Clinical Goal(s):  Over the next 90 days, patient will verbalize ability to afford treatment regimen Over the next 90 days, patient will achieve control of diabetes as evidenced by A1c through collaboration with PharmD and  provider.   Interventions: 1:1 collaboration with Ann Pheasant, MD regarding development and update of comprehensive plan of care as evidenced by provider attestation and co-signature Inter-disciplinary care team collaboration (see longitudinal plan of care) Comprehensive medication review performed; medication list updated in electronic medical record  Medication Management: Reports she is remembering to take her medications recently.   SDOH: Stressed. Reports that a foster grandchild (her daughter that lives with her) was killed in a car accident last week. Taking care of her daughter.   Health Maintenance: Due for DM foot exam. Placed reminder in appointment notes for upcoming PCP appointment.   Diabetes: Uncontrolled; current treatment: Farxiga 10 mg daily, Ozempic 0.5 mg weekly Hx metformin- stopped after a 2019 hospitalization, reason for discontinuation unclear  Hx Tresiba - discontinued when no longer needed No GI upset, symptoms of GU infections.  Charlton Haws from [employer] patient assistance, Ozempic from [employer] patient assistance Current blood sugar readings: fasting 138 (historically more in 150s) Reports home weight 122 lbs. Does not want to lose any more weight.  Advised periodic home checks to ensure DM staying controlled. Advised to check glucose readings between now and when she sees PCP next month.  Continue current regimen at this time  Hypertension: Controlled per last clinic reading; current treatment: lisinopril 20 mg daily - though reports she is taking 10 mg daily, carvedilol 3.125 mg BID Home BP readings: has not been checking lately.  Has not filled 20 mg script yet in 2022 (had large stockpile at home). Is not in adherence gap yet.  Will send lisinopril script in for 10 mg tablet. Advised patient to avoid filling script on insurance until she finishes home supply of 20 mg tablet. Contacted pharmacy to ask that they cancel 20 mg script and  place 10 mg on hold  Hyperlipidemia and ASCVD risk reduction: Controlled per last lipid panel; current treatment: atorvastatin 20 mg daily Current antiplatelet regimen: aspirin 81 mg daily, though notes she sometimes forgets  Continue current regimen at this time.   Hypothyroidism: Uncontrolled per last lab work; current regimen: levothyroxine 112 mcg daily Confirmed appropriate administration Recommended to continue current regimen at this time  Tobacco Abuse: 1 packs per day; denies being in a place to be able to work on tobacco cessation d/t stressors in her life.  Continue to evaluate readiness to quit and remind of health benefits and benefits for those around her. Advised to let us know when she is ready to consider reduction or cessation   Acid  Reflux: Controlled per patient report; current regimen: pantoprazole 40 mg PRN Previously recommended to continue current regimen at this time. Will discuss ability for trial of H2RA instead in the future.   Osteopenia: DEXA 06/2020: - t score -1.4, osteopenia; FRAX calculation of risk of major osteoporotic fracture of 16%, risk of hip fracture of 4.5% hip Advised by PCP to start calcium and vitamin D supplementation. Reiterated today.  Consider treatment given risk of hip fracture >3%. Encouraged to discuss w/ PCP at next appointment  Patient Goals/Self-Care Activities Over the next 90 days, patient will:  - take medications as prescribed focus on medication adherence by moving lisinopril to AM administration check glucose daily, alternating between fasting and post prandial, document, and provide at future appointments collaborate with provider on medication access solutions  Follow Up Plan: Telephone follow up appointment with care management team member scheduled for: ~ 8 weeks      Medication Assistance:  Franne Grip obtained through [employer], [employer] medication assistance program.  Enrollment ends 02/06/21;  01/06/21  Patient's preferred pharmacy is:  [facility], [place] - [address] [facility] [address] [place] [address] Phone: [phone] Fax: [phone]   Follow Up:  Patient agrees to Care Plan and Follow-up.  Plan: Telephone follow up appointment with care management team member scheduled for:  ~ 8 weeks  [name], PharmD, [place], [place] Clinical Pharmacist [employer] at [employer] [phone]

## 2020-09-07 NOTE — Patient Instructions (Signed)
Ann Collins,   It was great talking to you today!   Continue your current regimen for diabetes - Farxiga 10 mg daily and Ozempic 0.5 mg weekly. Do check your blood sugars periodically. Check your blood sugars periodically, alternating between: 1) Fasting, first thing in the morning before breakfast and  2) 2 hours after your largest meal.   For a goal A1c of less than 7%, goal fasting readings are less than 130 and goal 2 hour after meal readings are less than 180.   Continue lisinopril 10 mg (finish the supply of 20 mg tablets first, then fill the new prescription for 10 mg) daily, along with carvedilol 3.125 mg twice daily. Occasionally check your blood pressures at home and write down these readings for [place]. Keeping your blood pressures and blood sugars under control is the best way to protect your kidneys in the long run.  Keep taking atorvastatin 20 mg daily. Your cholesterol was really well controlled at your last visit!  Please make sure you are taking a calcium and vitamin D supplement daily. We recommend you get 1200 mg of calcium and [phone] units of Vitamin D daily. You should look for a type of calcium called calcium citrate (Citrical is the brand name). Citrate is the best type of calcium for you to be on since you are also on the pantoprazole.   As always, call us with any questions or concerns!  [name], PharmD [phone]  Visit Information  PATIENT GOALS:  Goals Addressed               This Visit's Progress     Patient Stated     Medication Monitoring (pt-stated)        Patient Goals/Self-Care Activities Over the next 90 days, patient will:  - take medications as prescribed focus on medication adherence by moving lisinopril to AM administration check glucose daily, alternating between fasting and post prandial, document, and provide at future appointments collaborate with provider on medication access solutions         The patient verbalized  understanding of instructions, educational materials, and care plan provided today and agreed to receive a mailed copy of patient instructions, educational materials, and care plan.   Plan: Telephone follow up appointment with care management team member scheduled for:  ~ 8 weeks  [name], PharmD, [place], [place] Clinical Pharmacist [employer] at [employer] [phone]

## 2020-09-08 ENCOUNTER — Other Ambulatory Visit: Payer: Self-pay

## 2020-09-08 ENCOUNTER — Other Ambulatory Visit (INDEPENDENT_AMBULATORY_CARE_PROVIDER_SITE_OTHER): Payer: Medicare Other

## 2020-09-08 DIAGNOSIS — E039 Hypothyroidism, unspecified: Secondary | ICD-10-CM

## 2020-09-10 LAB — TSH: TSH: 0.44 u[IU]/mL (ref 0.35–5.50)

## 2020-09-18 ENCOUNTER — Other Ambulatory Visit: Payer: Self-pay | Admitting: Internal Medicine

## 2020-09-21 ENCOUNTER — Other Ambulatory Visit: Payer: Self-pay | Admitting: Internal Medicine

## 2020-09-29 ENCOUNTER — Other Ambulatory Visit: Payer: Self-pay

## 2020-09-29 ENCOUNTER — Encounter: Payer: Self-pay | Admitting: Internal Medicine

## 2020-09-29 ENCOUNTER — Ambulatory Visit (INDEPENDENT_AMBULATORY_CARE_PROVIDER_SITE_OTHER): Payer: Medicare Other | Admitting: Internal Medicine

## 2020-09-29 VITALS — BP 122/64 | HR 65 | Temp 97.5°F | Resp 16 | Ht 62.0 in | Wt 121.4 lb

## 2020-09-29 DIAGNOSIS — E1165 Type 2 diabetes mellitus with hyperglycemia: Secondary | ICD-10-CM | POA: Diagnosis not present

## 2020-09-29 DIAGNOSIS — K2100 Gastro-esophageal reflux disease with esophagitis, without bleeding: Secondary | ICD-10-CM | POA: Diagnosis not present

## 2020-09-29 DIAGNOSIS — I2510 Atherosclerotic heart disease of native coronary artery without angina pectoris: Secondary | ICD-10-CM

## 2020-09-29 DIAGNOSIS — E039 Hypothyroidism, unspecified: Secondary | ICD-10-CM

## 2020-09-29 DIAGNOSIS — I700 Atherosclerosis of aorta: Secondary | ICD-10-CM

## 2020-09-29 DIAGNOSIS — I10 Essential (primary) hypertension: Secondary | ICD-10-CM

## 2020-09-29 DIAGNOSIS — R634 Abnormal weight loss: Secondary | ICD-10-CM | POA: Diagnosis not present

## 2020-09-29 DIAGNOSIS — Z87891 Personal history of nicotine dependence: Secondary | ICD-10-CM

## 2020-09-29 DIAGNOSIS — E7800 Pure hypercholesterolemia, unspecified: Secondary | ICD-10-CM

## 2020-09-29 DIAGNOSIS — D696 Thrombocytopenia, unspecified: Secondary | ICD-10-CM

## 2020-09-29 LAB — CBC WITH DIFFERENTIAL/PLATELET
Basophils Absolute: 0.1 10*3/uL (ref 0.0–0.1)
Basophils Relative: 1 % (ref 0.0–3.0)
Eosinophils Absolute: 0.2 10*3/uL (ref 0.0–0.7)
Eosinophils Relative: 2.8 % (ref 0.0–5.0)
HCT: 41.3 % (ref 36.0–46.0)
Hemoglobin: 13.3 g/dL (ref 12.0–15.0)
Lymphocytes Relative: 40.5 % (ref 12.0–46.0)
Lymphs Abs: 2.5 10*3/uL (ref 0.7–4.0)
MCHC: 32.2 g/dL (ref 30.0–36.0)
MCV: 88.8 fl (ref 78.0–100.0)
Monocytes Absolute: 0.3 10*3/uL (ref 0.1–1.0)
Monocytes Relative: 4.8 % (ref 3.0–12.0)
Neutro Abs: 3.1 10*3/uL (ref 1.4–7.7)
Neutrophils Relative %: 50.9 % (ref 43.0–77.0)
Platelets: 142 10*3/uL — ABNORMAL LOW (ref 150.0–400.0)
RBC: 4.65 Mil/uL (ref 3.87–5.11)
RDW: 15.8 % — ABNORMAL HIGH (ref 11.5–15.5)
WBC: 6.2 10*3/uL (ref 4.0–10.5)

## 2020-09-29 LAB — VITAMIN B12: Vitamin B-12: 257 pg/mL (ref 211–911)

## 2020-09-29 LAB — HM DIABETES FOOT EXAM

## 2020-09-29 NOTE — Progress Notes (Signed)
Patient ID: BRISSA ASANTE, female   DOB: Jan 28, 1946, 75 y.o.   MRN: [id number]   Subjective:    Patient ID: ANDRIANA CASA, female    DOB: 1945/12/12, 75 y.o.   MRN: [id number]  HPI This visit occurred during the SARS-CoV-2 public health emergency.  Safety protocols were in place, including screening questions prior to the visit, additional usage of staff PPE, and extensive cleaning of exam room while observing appropriate contact time as indicated for disinfecting solutions.   Patient here for a scheduled follow up.  Here to follow-up blood sugar, blood pressure and cholesterol.  She has lost weight.  Weight is 121 pounds now.  She is on Ozempic.  She actually feels like her appetite is better now than previous.  Denies any nausea or vomiting.  No abdominal pain.  Eating regular meals.  No chest pain or shortness of breath reported.  No bowel change.  States in the mornings her sugars are running around 150s.  Evening sugars 130s.  Still smoking.  Discussed quitting.  This helps with her stress.  She is not ready to quit at this time.  Increased stress with family issues.  Discussed.  Has good support.  Does not feel she needs any further invention.  Is concerned regarding increased bruising.  Past Medical History:  Diagnosis Date   Abdominal hernia    CAD (coronary artery disease)    a. LHC 7/19: LM nl, p-mLAD 100% w/ right to left collaterals, OM2 90% (2 mm), 1st LPL 40%, medical management   Diabetes mellitus ([place])    Diverticulosis    GERD (gastroesophageal reflux disease)    Hematuria    Hypercholesterolemia    Hypertension    Hypothyroidism    Kidney stones    Past Surgical History:  Procedure Laterality Date   APPENDECTOMY     BREAST BIOPSY  1969   right   CHOLECYSTECTOMY  2005   COLONOSCOPY     COLONOSCOPY WITH PROPOFOL N/A 09/19/2017   Procedure: COLONOSCOPY WITH PROPOFOL;  Surgeon: Toledo, Benay Pike, MD;  Location: [facility];  Service: Gastroenterology;  Laterality:  N/A;   ESOPHAGOGASTRODUODENOSCOPY (EGD) WITH PROPOFOL N/A 09/19/2017   Procedure: ESOPHAGOGASTRODUODENOSCOPY (EGD) WITH PROPOFOL;  Surgeon: Toledo, Benay Pike, MD;  Location: [facility];  Service: Gastroenterology;  Laterality: N/A;   LEFT HEART CATH AND CORONARY ANGIOGRAPHY Left 08/07/2017   Procedure: LEFT HEART CATH AND CORONARY ANGIOGRAPHY;  Surgeon: Wellington Hampshire, MD;  Location: [place] [facility];  Service: Cardiovascular;  Laterality: Left;   UPPER GI ENDOSCOPY     Family History  Problem Relation Age of Onset   Brain cancer Mother        history of brain tumor   Lung disease Father    Diabetes Brother        x2   Bipolar disorder Son    Alcohol abuse Son    Breast cancer Neg Hx    Colon cancer Neg Hx    Social History   Socioeconomic History   Marital status: Married    Spouse name: Not on file   Number of children: Not on file   Years of education: Not on file   Highest education level: Not on file  Occupational History   Not on file  Tobacco Use   Smoking status: Every Day    Packs/day: 1.00    Years: 50.00    Pack years: 50.00    Types: Cigarettes   Smokeless tobacco: Never  Vaping  Use   Vaping Use: Never used  Substance and Sexual Activity   Alcohol use: No    Alcohol/week: 0.0 standard drinks   Drug use: No   Sexual activity: Not on file  Other Topics Concern   Not on file  Social History Narrative   Not on file   Social Determinants of Health   Financial Resource Strain: Medium Risk   Difficulty of Paying Living Expenses: Somewhat hard  Food Insecurity: No Food Insecurity   Worried About Running Out of Food in the Last Year: Never true   Ran Out of Food in the Last Year: Never true  Transportation Needs: No Transportation Needs   Lack of Transportation (Medical): No   Lack of Transportation (Non-Medical): No  Physical Activity: Insufficiently Active   Days of Exercise per Week: 3 days   Minutes of Exercise per Session: 30 min  Stress:  Stress Concern Present   Feeling of Stress : Rather much  Social Connections: Unknown   Frequency of Communication with Friends and Family: Not on file   Frequency of Social Gatherings with Friends and Family: Not on file   Attends Religious Services: Not on file   Active Member of Clubs or Organizations: Not on file   Attends Archivist Meetings: Not on file   Marital Status: Married    Review of Systems  Constitutional:  Negative for appetite change.       Decreased weight as outlined.    HENT:  Negative for congestion and sinus pressure.   Respiratory:  Negative for cough, chest tightness and shortness of breath.   Cardiovascular:  Negative for chest pain, palpitations and leg swelling.  Gastrointestinal:  Negative for abdominal pain, constipation, nausea and vomiting.  Genitourinary:  Negative for difficulty urinating and dysuria.  Musculoskeletal:  Negative for joint swelling and myalgias.  Skin:  Negative for color change and rash.  Neurological:  Negative for dizziness, light-headedness and headaches.  Psychiatric/Behavioral:  Negative for agitation and dysphoric mood.       Objective:    Physical Exam Vitals reviewed.  Constitutional:      General: She is not in acute distress.    Appearance: Normal appearance.  HENT:     Head: Normocephalic and atraumatic.     Right Ear: External ear normal.     Left Ear: External ear normal.  Eyes:     General: No scleral icterus.       Right eye: No discharge.        Left eye: No discharge.     Conjunctiva/sclera: Conjunctivae normal.  Neck:     Thyroid: No thyromegaly.  Cardiovascular:     Rate and Rhythm: Normal rate and regular rhythm.  Pulmonary:     Effort: No respiratory distress.     Breath sounds: Normal breath sounds. No wheezing.  Abdominal:     General: Bowel sounds are normal.     Palpations: Abdomen is soft.     Tenderness: There is no abdominal tenderness.  Musculoskeletal:        General: No  swelling or tenderness.     Cervical back: Neck supple. No tenderness.  Lymphadenopathy:     Cervical: No cervical adenopathy.  Skin:    Findings: No erythema or rash.  Neurological:     Mental Status: She is alert.  Psychiatric:        Mood and Affect: Mood normal.        Behavior: Behavior normal.    BP  122/64   Pulse 65   Temp (!) 97.5 F (36.4 C)   Resp 16   Ht 5' 2"  (1.575 m)   Wt 121 lb 6.4 oz (55.1 kg)   LMP 03/10/1991   SpO2 99%   BMI 22.20 kg/m  Wt Readings from Last 3 Encounters:  09/29/20 121 lb 6.4 oz (55.1 kg)  07/28/20 125 lb (56.7 kg)  07/17/20 125 lb 8 oz (56.9 kg)    Outpatient Encounter Medications as of 09/29/2020  Medication Sig   aspirin 81 MG tablet Take 81 mg by mouth daily.   atorvastatin (LIPITOR) 20 MG tablet Take 1 tablet (20 mg total) by mouth daily.   blood glucose meter kit and supplies Dispense based on patient and insurance preference. Use up to four times daily as directed. (FOR ICD-10 E10.9, E11.9).   carvedilol (COREG) 3.125 MG tablet Take 1 tablet (3.125 mg total) by mouth 2 (two) times daily.   dapagliflozin propanediol (FARXIGA) 10 MG TABS tablet Take 10 mg by mouth in the morning.   glucose blood test strip Use as instructed to blood sugar twice daily   levothyroxine (SYNTHROID) 112 MCG tablet TAKE 1 TABLET(112 MCG) BY MOUTH DAILY BEFORE AND BREAKFAST   lisinopril (ZESTRIL) 10 MG tablet Take 1 tablet (10 mg total) by mouth daily.   pantoprazole (PROTONIX) 40 MG tablet Take 1 tablet (40 mg total) by mouth daily as needed.   Semaglutide (OZEMPIC, 0.25 OR 0.5 MG/DOSE, [place]) Inject 0.5 mg into the skin once a week.   triamcinolone (KENALOG) 0.1 % Apply 1 application topically 2 (two) times daily.   No facility-administered encounter medications on file as of 09/29/2020.     Lab Results  Component Value Date   WBC 6.2 09/29/2020   HGB 13.3 09/29/2020   HCT 41.3 09/29/2020   PLT 142.0 (L) 09/29/2020   GLUCOSE 153 (H) 07/15/2020   CHOL  105 07/15/2020   TRIG 166.0 (H) 07/15/2020   HDL 32.60 (L) 07/15/2020   LDLDIRECT 71 10/18/2017   LDLCALC 40 07/15/2020   ALT 19 07/15/2020   AST 17 07/15/2020   NA 139 07/15/2020   K 4.0 07/15/2020   CL 105 07/15/2020   CREATININE 0.99 07/15/2020   BUN 16 07/15/2020   CO2 26 07/15/2020   TSH 0.44 09/08/2020   INR 0.87 08/07/2017   HGBA1C 7.5 (H) 07/15/2020   MICROALBUR <0.7 08/13/2019    DG Bone Density  Result Date: 06/15/2020 EXAM: DUAL X-RAY ABSORPTIOMETRY (DXA) FOR BONE MINERAL DENSITY IMPRESSION: Your patient Adreana Coull completed a BMD test on 06/15/2020 using the [place] (software version: 14.10) manufactured by [employer]. The following summarizes the results of our evaluation. Technologist: SCE PATIENT BIOGRAPHICAL: Name: Tamaira, Ciriello Patient ID: [id number] Birth Date: 1945/11/08 Height: 62.0 in. Gender: Female Exam Date: 06/15/2020 Weight: 127.1 lbs. Indications: Advanced Age, Diabetic, Early Menopause, History of Fracture (Adult), Hypothyroid, Postmenopausal, Tobacco User (Current Smoker) Fractures: fingers, Left wrist, Toes Treatments: Farxiga, Levothyroxine, Ozempic Injection DENSITOMETRY RESULTS: Site      Region    Measured Date Measured Age WHO Classification Young Adult T-score BMD         %Change vs. Previous Significant Change (*) AP Spine L1-L3 06/15/[age] 74.5 Normal 0.9 1.296 g/cm2 - - DualFemur Neck Left 06/15/2020 74.5 Osteopenia -1.4 0.850 g/cm2 - - ASSESSMENT: The BMD measured at Femur Neck Left is 0.850 g/cm2 with a T-score of -1.4. This patient is considered osteopenic according to [facility] (  WHO) criteria. The scan quality is good. L4 was excluded due to degenerative changes. World Pharmacologist [facility]) criteria for post-menopausal, Caucasian Women: Normal:                   T-score at or above -1 SD Osteopenia/low bone mass: T-score between -1 and -2.5 SD Osteoporosis:             T-score at or below -2.5 SD  RECOMMENDATIONS: 1. All patients should optimize calcium and vitamin D intake. 2. Consider FDA-approved medical therapies in postmenopausal women and men aged 95 years and older, based on the following: a. A hip or vertebral(clinical or morphometric) fracture b. T-score < -2.5 at the femoral neck or spine after appropriate evaluation to exclude secondary causes c. Low bone mass (T-score between -1.0 and -2.5 at the femoral neck or spine) and a 10-year probability of a hip fracture > 3% or a 10-year probability of a major osteoporosis-related fracture > 20% based on the [place]-adapted WHO algorithm 3. Clinician judgment and/or patient preferences may indicate treatment for people with 10-year fracture probabilities above or below these levels FOLLOW-UP: People with diagnosed cases of osteoporosis or at high risk for fracture should have regular bone mineral density tests. For patients eligible for Medicare, routine testing is allowed once every 2 years. The testing frequency can be increased to one year for patients who have rapidly progressing disease, those who are receiving or discontinuing medical therapy to restore bone mass, or have additional risk factors. I have reviewed this report, and agree with the above findings. [facility] Dear Dr Nicki Reaper, Your patient RODNISHA BLOMGREN completed a FRAX assessment on 06/15/2020 using the [place] (analysis version: 14.10) manufactured by [employer]. The following summarizes the results of our evaluation. PATIENT BIOGRAPHICAL: Name: Tahisha, Hakim Patient ID: [id number] Birth Date: 09/19/1945 Height:    62.0 in. Gender:     [age]    [age]       Weight:    127.1 lbs. Ethnicity:  White                            Exam Date: 06/15/2020 FRAX* RESULTS:  (version: 3.5) 10-year Probability of Fracture1 Major Osteoporotic Fracture2 Hip Fracture 16.1% 4.4% Population: [place] (Caucasian) Risk Factors: History of Fracture (Adult), Tobacco User  (Current Smoker) Based on Femur (Left) Neck BMD 1 -The 10-year probability of fracture may be lower than reported if the patient has received treatment. 2 -Major Osteoporotic Fracture: Clinical Spine, Forearm, Hip or Shoulder *FRAX is a Materials engineer of the [employer] for Metabolic Bone Disease, a [place] (WHO) [facility]. ASSESSMENT: The probability of a major osteoporotic fracture is 16.1% within the next ten years. The probability of a hip fracture is 4.4% within the next ten years. . I have reviewed this report and agree with the above findings. Mark A. Thornton Papas, M.D. [facility] Electronically Signed   By: Lavonia Dana M.D.   On: 06/15/2020 16:21   MM 3D SCREEN BREAST BILATERAL  Result Date: 06/15/2020 CLINICAL DATA:  Screening. EXAM: DIGITAL SCREENING BILATERAL MAMMOGRAM WITH TOMOSYNTHESIS AND CAD TECHNIQUE: Bilateral screening digital craniocaudal and mediolateral oblique mammograms were obtained. Bilateral screening digital breast tomosynthesis was performed. The images were evaluated with computer-aided detection. COMPARISON:  Previous exam(s). ACR Breast Density Category b: There are scattered areas of fibroglandular  density. FINDINGS: There are no findings suspicious for malignancy. The images were evaluated with computer-aided detection. IMPRESSION: No mammographic evidence of malignancy. A result letter of this screening mammogram will be mailed directly to the patient. RECOMMENDATION: Screening mammogram in one year. (Code:SM-B-01Y) BI-RADS CATEGORY  1: Negative. Electronically Signed   By: Audie Pinto M.D.   On: 06/15/2020 15:26       Assessment & Plan:   Problem List Items Addressed This Visit     Aortic atherosclerosis ([place])    Continue lipitor.       Diabetes mellitus ([place]) - Primary    On farxiga and ozempic.  Appetite has improved.  Follow sugars.  Follow met b and a1c.       Relevant Orders   Hemoglobin  A1c   GERD (gastroesophageal reflux disease)    No upper symptoms reported. On protonix.       Hypercholesterolemia    Continue lipitor.  Follow lipid panel and liver function tests.        Relevant Orders   Lipid panel   Hepatic function panel   Hypertension    Continue lisinopril.  Blood pressure doing well.  Hold on making any changes. Follow pressures.  Follow metabolic panel.       Relevant Orders   CBC with Differential/Platelet   Basic metabolic panel   TSH   Hypothyroidism    On thyroid replacement.  Follow tsh.      Personal history of tobacco use, presenting hazards to health    Discussed the need to quit.  Desires not to quit at this time.        Thrombocytopenia ([facility])    Slightly decreased platelet count.  Recheck cbc.       Relevant Orders   CBC with Differential/Platelet (Completed)   Vitamin B12 (Completed)   Weight loss    Colonoscopy 2019.  Appetite is better.  Has desired no further intervention.  Follow.         Einar Pheasant, MD

## 2020-10-01 ENCOUNTER — Encounter: Payer: Self-pay | Admitting: Internal Medicine

## 2020-10-01 NOTE — Assessment & Plan Note (Signed)
On thyroid replacement.  Follow tsh.  

## 2020-10-01 NOTE — Assessment & Plan Note (Signed)
Continue lipitor  ?

## 2020-10-01 NOTE — Assessment & Plan Note (Signed)
Slightly decreased platelet count.  Recheck cbc.

## 2020-10-01 NOTE — Assessment & Plan Note (Signed)
Colonoscopy 2019.  Appetite is better.  Has desired no further intervention.  Follow.

## 2020-10-01 NOTE — Assessment & Plan Note (Signed)
Continue lisinopril.  Blood pressure doing well.  Hold on making any changes. Follow pressures.  Follow metabolic panel.

## 2020-10-01 NOTE — Assessment & Plan Note (Signed)
Continue lipitor.  Follow lipid panel and liver function tests.   

## 2020-10-01 NOTE — Assessment & Plan Note (Signed)
On farxiga and ozempic.  Appetite has improved.  Follow sugars.  Follow met b and a1c.

## 2020-10-01 NOTE — Assessment & Plan Note (Signed)
No upper symptoms reported.  On protonix.   

## 2020-10-01 NOTE — Assessment & Plan Note (Signed)
Discussed the need to quit.  Desires not to quit at this time.

## 2020-10-03 DIAGNOSIS — Z20822 Contact with and (suspected) exposure to covid-19: Secondary | ICD-10-CM | POA: Diagnosis not present

## 2020-11-16 ENCOUNTER — Ambulatory Visit (INDEPENDENT_AMBULATORY_CARE_PROVIDER_SITE_OTHER): Payer: Medicare Other | Admitting: Pharmacist

## 2020-11-16 DIAGNOSIS — E039 Hypothyroidism, unspecified: Secondary | ICD-10-CM

## 2020-11-16 DIAGNOSIS — I10 Essential (primary) hypertension: Secondary | ICD-10-CM

## 2020-11-16 DIAGNOSIS — E7800 Pure hypercholesterolemia, unspecified: Secondary | ICD-10-CM

## 2020-11-16 DIAGNOSIS — E1165 Type 2 diabetes mellitus with hyperglycemia: Secondary | ICD-10-CM

## 2020-11-16 MED ORDER — LEVOTHYROXINE SODIUM 112 MCG PO TABS: ORAL_TABLET | ORAL | 1 refills | Status: DC

## 2020-11-16 MED ORDER — LISINOPRIL 5 MG PO TABS: 5.0000 mg | ORAL_TABLET | Freq: Every day | ORAL | 1 refills | Status: DC

## 2020-11-16 NOTE — Chronic Care Management (AMB) (Signed)
Chronic Care Management Pharmacy Note  11/16/2020 Name:  Ann Collins MRN:  [id number] DOB:  [birth year]  Subjective: Ann Collins is an 75 y.o. year old female who is a primary patient of Einar Pheasant, MD.  The CCM team was consulted for assistance with disease management and care coordination needs.    Engaged with patient by telephone for follow up visit in response to provider referral for pharmacy case management and/or care coordination services.   Consent to Services:  The patient was given information about Chronic Care Management services, agreed to services, and gave verbal consent prior to initiation of services.  Please see initial visit note for detailed documentation.   Patient Care Team: Einar Pheasant, MD as PCP - General (Internal Medicine) Wellington Hampshire, MD as PCP - Cardiology (Cardiology) De Hollingshead, RPH-CPP as Pharmacist (Pharmacist)   Objective:  Lab Results  Component Value Date   CREATININE 0.99 07/15/2020   CREATININE 1.12 03/23/2020   CREATININE 1.16 12/16/2019    Lab Results  Component Value Date   HGBA1C 7.5 (H) 07/15/2020   Last diabetic Eye exam:  Lab Results  Component Value Date/Time   HMDIABEYEEXA No Retinopathy 06/22/2020 12:00 AM    Last diabetic Foot exam:  Lab Results  Component Value Date/Time   HMDIABFOOTEX on my exam 09/29/2020 12:00 AM        Component Value Date/Time   CHOL 105 07/15/2020 0733   CHOL 134 10/18/2017 1447   TRIG 166.0 (H) 07/15/2020 0733   HDL 32.60 (L) 07/15/2020 0733   HDL 41 10/18/2017 1447   CHOLHDL 3 07/15/2020 0733   VLDL 33.2 07/15/2020 0733   LDLCALC 40 07/15/2020 0733   LDLCALC 63 10/18/2017 1447   LDLDIRECT 71 10/18/2017 1447   LDLDIRECT 156.0 08/29/2016 1523    Hepatic Function Latest Ref Rng & Units 07/15/2020 03/23/2020 12/16/2019  Total Protein 6.0 - 8.3 g/dL 6.7 6.9 6.6  Albumin 3.5 - 5.2 g/dL 3.9 4.2 4.2  AST 0 - 37 U/L 17 13 14   ALT 0 - 35 U/L 19 14 16   Alk  Phosphatase 39 - 117 U/L 78 91 95  Total Bilirubin 0.2 - 1.2 mg/dL 0.4 0.6 0.5  Bilirubin, Direct 0.0 - 0.3 mg/dL 0.1 0.1 0.1    Lab Results  Component Value Date/Time   TSH 0.44 09/08/2020 02:21 PM   TSH 4.72 (H) 07/15/2020 07:33 AM    CBC Latest Ref Rng & Units 09/29/2020 07/15/2020 03/23/2020  WBC 4.0 - 10.5 K/uL 6.2 7.1 7.8  Hemoglobin 12.0 - 15.0 g/dL 13.3 13.6 14.3  Hematocrit 36.0 - 46.0 % 41.3 41.8 43.4  Platelets 150.0 - 400.0 K/uL 142.0(L) 123.0(L) 137.0(L)     Social History   Tobacco Use  Smoking Status Every Day   Packs/day: 1.00   Years: 50.00   Pack years: 50.00   Types: Cigarettes  Smokeless Tobacco Never   BP Readings from Last 3 Encounters:  09/29/20 122/64  07/28/20 122/60  07/17/20 (!) 158/80   Pulse Readings from Last 3 Encounters:  09/29/20 65  07/28/20 61  07/17/20 63   Wt Readings from Last 3 Encounters:  09/29/20 121 lb 6.4 oz (55.1 kg)  07/28/20 125 lb (56.7 kg)  07/17/20 125 lb 8 oz (56.9 kg)    Assessment: Review of patient past medical history, allergies, medications, health status, including review of consultants reports, laboratory and other test data, was performed as part of comprehensive evaluation and provision of chronic care  management services.   SDOH:  (Social Determinants of Health) assessments and interventions performed:  SDOH Interventions    Flowsheet Row Most Recent Value  SDOH Interventions   Financial Strain Interventions Other (Comment)  [manufacturer assistance]       CCM Care Plan  Allergies  Allergen Reactions   Pioglitazone Swelling   Macrobid [Nitrofurantoin Macrocrystal] Other (See Comments)    Body aches   Nitrofurantoin Other (See Comments)    Body aches    Medications Reviewed Today     Reviewed by De Hollingshead, RPH-CPP (Pharmacist) on 11/16/20 at 0909  Med List Status: <None>   Medication Order Taking? Sig Documenting Provider Last Dose Status Informant  aspirin 81 MG tablet [id number]  Yes Take 81 mg by mouth daily. [provider] Taking Active   atorvastatin (LIPITOR) 20 MG tablet [id number] Yes Take 1 tablet (20 mg total) by mouth daily. Einar Pheasant, MD Taking Active   blood glucose meter kit and supplies [id number] Yes Dispense based on patient and insurance preference. Use up to four times daily as directed. (FOR ICD-10 E10.9, E11.9). Einar Pheasant, MD Taking Active   carvedilol (COREG) 3.125 MG tablet [id number] Yes Take 1 tablet (3.125 mg total) by mouth 2 (two) times daily. Einar Pheasant, MD Taking Active   dapagliflozin propanediol (FARXIGA) 10 MG TABS tablet [id number] Yes Take 10 mg by mouth in the morning. [provider] Taking Active   glucose blood test strip [id number] Yes Use as instructed to blood sugar twice daily Einar Pheasant, MD Taking Active   levothyroxine (SYNTHROID) 112 MCG tablet [id number] Yes TAKE 1 TABLET(112 MCG) BY MOUTH DAILY BEFORE AND Laveda Norman, MD Taking Active   lisinopril (ZESTRIL) 10 MG tablet [id number] Yes Take 1 tablet (10 mg total) by mouth daily. Einar Pheasant, MD Taking Active            Med Note Mayo Ao Nov 16, 2020  9:02 AM) Reports she does not take regularly  pantoprazole (PROTONIX) 40 MG tablet [id number] Yes Take 1 tablet (40 mg total) by mouth daily as needed. Einar Pheasant, MD Taking Active   Semaglutide [facility], 0.25 OR 0.5 MG/DOSE, [place]) [id number] Yes Inject 0.5 mg into the skin once a week. [provider] Taking Active   triamcinolone (KENALOG) 0.1 % [id number] Yes Apply 1 application topically 2 (two) times daily. Einar Pheasant, MD Taking Active   vitamin B-12 (CYANOCOBALAMIN) 1000 MCG tablet [id number] Yes Take 1,000 mcg by mouth daily. [provider] Taking Active             Patient Active Problem List   Diagnosis Date Noted   Colon cancer screening 08/03/2020   Weight loss 08/03/2020   Fall 08/03/2020   Skin lesion 04/05/2020    Adrenal adenoma 08/19/2019   Breast cancer screening 08/19/2019   Thrombocytopenia ([place]) 01/27/2019   Left wrist fracture 04/27/2018   Cough 04/27/2018   Abnormal EKG    Chest pain 07/10/2017   Renal lesion 04/10/2016   Aortic atherosclerosis ([place]) 06/30/2015   Personal history of tobacco use, presenting hazards to health 06/25/2015   Left carotid bruit 12/28/2014   Health care maintenance 07/29/2014   Diabetes mellitus ([place]) 03/10/2012   Hypothyroidism 03/10/2012   Hypercholesterolemia 03/10/2012   Hypertension 03/10/2012   GERD (gastroesophageal reflux disease) 03/10/2012    Immunization History  Administered Date(s) Administered   Fluad Quad(high Dose 65+) 11/11/2019   Influenza Split 10/16/2013   Influenza Whole 11/24/2016  Influenza, High Dose Seasonal PF 11/05/2017, 10/18/2018   Influenza,inj,quad, With Preservative 11/07/2017   Influenza-Unspecified 11/07/2012, 10/27/2014   Moderna Sars-Covid-2 Vaccination 03/09/2019, 04/07/2019, 01/30/2020, 07/29/2020   Pneumococcal Conjugate-13 03/02/2016   Pneumococcal Polysaccharide-23 12/08/2012, 03/02/2017   Tdap 10/14/2013    Conditions to be addressed/monitored: HTN, HLD, and DMII  Care Plan : Medication Management  Updates made by De Hollingshead, RPH-CPP since 11/16/2020 12:00 AM     Problem: Diabetes, HLD, CKD      Long-Range Goal: Disease Progression Prevention   This Visit's Progress: On track  Recent Progress: On track  Priority: High  Note:   Current Barriers:  Unable to independently afford treatment regimen Unable to independently monitor therapeutic efficacy Unable to maintain control of diabetes  Pharmacist Clinical Goal(s):  Over the next 90 days, patient will verbalize ability to afford treatment regimen Over the next 90 days, patient will achieve control of diabetes as evidenced by A1c through collaboration with PharmD and provider.   Interventions: 1:1 collaboration with Einar Pheasant, MD  regarding development and update of comprehensive plan of care as evidenced by provider attestation and co-signature Inter-disciplinary care team collaboration (see longitudinal plan of care) Comprehensive medication review performed; medication list updated in electronic medical record  Health Maintenance   Yearly diabetic eye exam: up to date Yearly diabetic foot exam: up to date Urine microalbumin: up to date Yearly influenza vaccination: due - recommended to receive this season Td/Tdap vaccination: up to date Pneumonia vaccination: up to date COVID vaccinations: due - recommended to pursue bivalent booster Shingrix vaccinations: due - recommended to pursue in 2023 Colonoscopy: up to date Bone density scan: up to date Mammogram: up to date  Diabetes: Uncontrolled; current treatment: Farxiga 10 mg daily, Ozempic 0.5 mg weekly Hx metformin- stopped after a 2019 hospitalization, reason for discontinuation unclear  Hx Tresiba - discontinued when no longer needed Receives Farxiga from [employer] patient assistance, Ozempic from [employer] patient assistance Current blood sugar readings: fasting 130-150s; 2 hour post prandial: 160s Current meal patterns: breakfast: bacon and egg sandwich, ham and egg sandwich; sometimes french toast; lunch: sometimes hot dog, varies, sometimes vegetables; supper: sometimes meatloaf, cabbage, sometimes chicken; sometimes baked potato; drinks: diet pepsi, coke, water, sometimes flavored waters; snacks: nabs, sometimes small servings of candy when with her grandson, banana, apple; protein bars Extensive dietary discussion. Patient adamant about not losing any more weight. Recommended to continue current regimen at this time. Follow upcoming A1c. Would avoid increasing Ozempic dose due to weight concerns.   Hypertension: Controlled per last clinic reading; current treatment: lisinopril 10 mg daily - has been taking 1/2 of a 20 mg tablet periodically, reports  some lightheadedness if she takes every day; carvedilol 3.125 mg BID Home BP readings: has not been checking lately.  Has not filled any lisinopril in 2022. Last filled 07/2019. Advised to dispose of old expired script. Due to lightheadedness with daily lisinopril 10 mg, will send script for lisinopril 5 mg daily. Patient amenable. Advised to check home BP periodically.   Hyperlipidemia and ASCVD risk reduction: Controlled per last lipid panel; current treatment: atorvastatin 20 mg daily Current antiplatelet regimen: aspirin 81 mg daily Recommended to continue current regimen at this time.   Hypothyroidism: Controlled per last lab work; current regimen: levothyroxine 112 mcg daily Recommended to continue current regimen at this time. She requested 90 day supply, will send.   Tobacco Abuse: 1 packs per day; denies being in a place to be able to work  on tobacco cessation d/t stressors in her life.  Continue to evaluate readiness to quit and remind of health benefits and benefits for those around her. Advised to let us know when she is ready to consider reduction or cessation   Acid Reflux: Controlled per patient report; current regimen: pantoprazole 40 mg PRN Previously recommended to continue current regimen at this time. Will discuss ability for trial of H2RA instead in the future.   Osteopenia: DEXA 06/2020: - t score -1.4, osteopenia; FRAX calculation of risk of major osteoporotic fracture of 16%, risk of hip fracture of 4.5% hip Consider treatment given risk of hip fracture >3%. Previously encouraged to discuss w/ PCP at next appointment.   Supplements: Vitamin B12 1000 mcg gummy  Patient Goals/Self-Care Activities Over the next 90 days, patient will:  - take medications as prescribed focus on medication adherence by utilizing pill box check glucose daily, alternating between fasting and post prandial, document, and provide at future appointments check blood pressure daily,  document, and provide at future appointments collaborate with provider on medication access solutions  Follow Up Plan: Telephone follow up appointment with care management team member scheduled for: ~ 8 weeks      Medication Assistance:  Ozempic, [name] obtained through [employer], [place] medication assistance program.  Enrollment ends 01/06/21; 02/06/21  Patient's preferred pharmacy is:  [facility], [place] [address] [facility] [address] [place] [address] Phone: [phone] Fax: [phone]   Follow Up:  Patient agrees to Care Plan and Follow-up.  Plan: Telephone follow up appointment with care management team member scheduled for:  ~ 8 weeks  [name], PharmD, [place], [place] Clinical Pharmacist [employer] [facility] [employer] [phone]

## 2020-11-16 NOTE — Patient Instructions (Signed)
Idy,   Happy Birthday!  Let's reduce lisinopril to 5 mg daily. Please check your blood pressure daily between now and your next appointment with Dr. Nicki Reaper, and especially if you experience any symptoms of lightheadedness, dizziness, or feeling like you are going to fall.   I also sent in the refill on the levothyroxine.   We recommend you get the influenza vaccine for this season.   We recommend you get the updated bivalent COVID-19 booster, at least 2 months after any prior doses. You may consider delaying a booster dose by 3 months from a prior episode of COVID-19 per the CDC.   You can find pharmacies that have this formulation in stock at [email or web address].    Take care!  [name], PharmD  Visit Information  PATIENT GOALS:  Goals Addressed               This Visit's Progress     Patient Stated     Medication Monitoring (pt-stated)        Patient Goals/Self-Care Activities Over the next 90 days, patient will:  - take medications as prescribed focus on medication adherence by utilizing pill box check glucose daily, alternating between fasting and post prandial, document, and provide at future appointments check blood pressure daily, document, and provide at future appointments collaborate with provider on medication access solutions        The patient verbalized understanding of instructions, educational materials, and care plan provided today and agreed to receive a mailed copy of patient instructions, educational materials, and care plan.   Plan: Telephone follow up appointment with care management team member scheduled for:  ~ 8 weeks  [name], PharmD, [place], [place] Clinical Pharmacist [employer] [facility] [employer] [phone]

## 2020-12-07 DIAGNOSIS — E7800 Pure hypercholesterolemia, unspecified: Secondary | ICD-10-CM | POA: Diagnosis not present

## 2020-12-07 DIAGNOSIS — E1165 Type 2 diabetes mellitus with hyperglycemia: Secondary | ICD-10-CM

## 2020-12-07 DIAGNOSIS — I10 Essential (primary) hypertension: Secondary | ICD-10-CM | POA: Diagnosis not present

## 2020-12-07 DIAGNOSIS — E039 Hypothyroidism, unspecified: Secondary | ICD-10-CM

## 2020-12-14 ENCOUNTER — Other Ambulatory Visit: Payer: Self-pay

## 2020-12-14 ENCOUNTER — Other Ambulatory Visit (INDEPENDENT_AMBULATORY_CARE_PROVIDER_SITE_OTHER): Payer: Medicare Other

## 2020-12-14 DIAGNOSIS — E7800 Pure hypercholesterolemia, unspecified: Secondary | ICD-10-CM

## 2020-12-14 DIAGNOSIS — E1165 Type 2 diabetes mellitus with hyperglycemia: Secondary | ICD-10-CM | POA: Diagnosis not present

## 2020-12-14 DIAGNOSIS — I10 Essential (primary) hypertension: Secondary | ICD-10-CM | POA: Diagnosis not present

## 2020-12-14 LAB — BASIC METABOLIC PANEL
BUN: 18 mg/dL (ref 6–23)
CO2: 26 mEq/L (ref 19–32)
Calcium: 9.6 mg/dL (ref 8.4–10.5)
Chloride: 105 mEq/L (ref 96–112)
Creatinine, Ser: 1.09 mg/dL (ref 0.40–1.20)
GFR: 49.85 mL/min — ABNORMAL LOW (ref >60.00)
Glucose, Bld: 170 mg/dL — ABNORMAL HIGH (ref 70–99)
Potassium: 4.5 mEq/L (ref 3.5–5.1)
Sodium: 139 mEq/L (ref 135–145)

## 2020-12-14 LAB — HEPATIC FUNCTION PANEL
ALT: 15 U/L (ref 0–35)
AST: 13 U/L (ref 0–37)
Albumin: 4.2 g/dL (ref 3.5–5.2)
Alkaline Phosphatase: 84 U/L (ref 39–117)
Bilirubin, Direct: 0.2 mg/dL (ref 0.0–0.3)
Total Bilirubin: 0.7 mg/dL (ref 0.2–1.2)
Total Protein: 6.7 g/dL (ref 6.0–8.3)

## 2020-12-14 LAB — CBC WITH DIFFERENTIAL/PLATELET
Basophils Absolute: 0.1 10*3/uL (ref 0.0–0.1)
Basophils Relative: 0.9 % (ref 0.0–3.0)
Eosinophils Absolute: 0.1 10*3/uL (ref 0.0–0.7)
Eosinophils Relative: 2.5 % (ref 0.0–5.0)
HCT: 42.4 % (ref 36.0–46.0)
Hemoglobin: 13.7 g/dL (ref 12.0–15.0)
Lymphocytes Relative: 52.9 % — ABNORMAL HIGH (ref 12.0–46.0)
Lymphs Abs: 3.1 10*3/uL (ref 0.7–4.0)
MCHC: 32.3 g/dL (ref 30.0–36.0)
MCV: 87.2 fl (ref 78.0–100.0)
Monocytes Absolute: 0.3 10*3/uL (ref 0.1–1.0)
Monocytes Relative: 5.1 % (ref 3.0–12.0)
Neutro Abs: 2.2 10*3/uL (ref 1.4–7.7)
Neutrophils Relative %: 38.6 % — ABNORMAL LOW (ref 43.0–77.0)
Platelets: 113 10*3/uL — ABNORMAL LOW (ref 150.0–400.0)
RBC: 4.86 Mil/uL (ref 3.87–5.11)
RDW: 16 % — ABNORMAL HIGH (ref 11.5–15.5)
WBC: 5.8 10*3/uL (ref 4.0–10.5)

## 2020-12-14 LAB — LIPID PANEL
Cholesterol: 120 mg/dL (ref 0–200)
HDL: 37.3 mg/dL — ABNORMAL LOW (ref >39.00)
LDL Cholesterol: 46 mg/dL (ref 0–99)
NonHDL: 82.47
Total CHOL/HDL Ratio: 3
Triglycerides: 182 mg/dL — ABNORMAL HIGH (ref 0.0–149.0)
VLDL: 36.4 mg/dL (ref 0.0–40.0)

## 2020-12-14 LAB — TSH: TSH: 2.45 u[IU]/mL (ref 0.35–5.50)

## 2020-12-14 LAB — HEMOGLOBIN A1C: Hgb A1c MFr Bld: 7.4 % — ABNORMAL HIGH (ref 4.6–6.5)

## 2020-12-15 ENCOUNTER — Other Ambulatory Visit: Payer: Self-pay | Admitting: Cardiovascular Disease

## 2020-12-15 ENCOUNTER — Ambulatory Visit (INDEPENDENT_AMBULATORY_CARE_PROVIDER_SITE_OTHER): Payer: Medicare Other | Admitting: Internal Medicine

## 2020-12-15 ENCOUNTER — Other Ambulatory Visit: Payer: Self-pay

## 2020-12-15 VITALS — BP 138/78 | HR 71 | Temp 97.8°F | Resp 16 | Ht 62.0 in | Wt 121.6 lb

## 2020-12-15 DIAGNOSIS — I10 Essential (primary) hypertension: Secondary | ICD-10-CM

## 2020-12-15 DIAGNOSIS — I2510 Atherosclerotic heart disease of native coronary artery without angina pectoris: Secondary | ICD-10-CM

## 2020-12-15 DIAGNOSIS — R634 Abnormal weight loss: Secondary | ICD-10-CM

## 2020-12-15 DIAGNOSIS — F439 Reaction to severe stress, unspecified: Secondary | ICD-10-CM

## 2020-12-15 DIAGNOSIS — D696 Thrombocytopenia, unspecified: Secondary | ICD-10-CM | POA: Diagnosis not present

## 2020-12-15 DIAGNOSIS — E039 Hypothyroidism, unspecified: Secondary | ICD-10-CM | POA: Diagnosis not present

## 2020-12-15 DIAGNOSIS — E7800 Pure hypercholesterolemia, unspecified: Secondary | ICD-10-CM

## 2020-12-15 DIAGNOSIS — K2100 Gastro-esophageal reflux disease with esophagitis, without bleeding: Secondary | ICD-10-CM | POA: Diagnosis not present

## 2020-12-15 DIAGNOSIS — R0981 Nasal congestion: Secondary | ICD-10-CM | POA: Diagnosis not present

## 2020-12-15 DIAGNOSIS — Z87891 Personal history of nicotine dependence: Secondary | ICD-10-CM | POA: Diagnosis not present

## 2020-12-15 DIAGNOSIS — I700 Atherosclerosis of aorta: Secondary | ICD-10-CM

## 2020-12-15 DIAGNOSIS — E1165 Type 2 diabetes mellitus with hyperglycemia: Secondary | ICD-10-CM

## 2020-12-15 NOTE — Progress Notes (Signed)
Patient ID: Ann Collins, female   DOB: December 19, 1945, 75 y.o.   MRN: [id number]   Subjective:    Patient ID: Ann Collins, female    DOB: 18-Apr-1945, 75 y.o.   MRN: [id number]  This visit occurred during the SARS-CoV-2 public health emergency.  Safety protocols were in place, including screening questions prior to the visit, additional usage of staff PPE, and extensive cleaning of exam room while observing appropriate contact time as indicated for disinfecting solutions.   Patient here for a scheduled follow up.   Chief Complaint  Patient presents with   Stress   .   HPI Here to follow up regarding her diabetes, blood pressure and cholesterol.  Increased stress.  Her daughter is being involuntarily committed.  Son-n-law - in the ER currently - accidental injury.  Discussed.  She feels she is handling things relatively well.  Does not feel needs any further intervention.  No chest pain.  Stays active.  Breathing stable.  Continuing to smoke.  Discussed the need to quit.  States due for lung screening CT.  Has not been contacted.  She feels her appetite is better.  Eating better.  Weight is stable from recent check.  Taking ozempic.  Discussed following for continued GI symptoms.  She prefers to continue for now.  No abdominal pain.     Past Medical History:  Diagnosis Date   Abdominal hernia    CAD (coronary artery disease)    a. LHC 7/19: LM nl, p-mLAD 100% w/ right to left collaterals, OM2 90% (2 mm), 1st LPL 40%, medical management   Diabetes mellitus ([place])    Diverticulosis    GERD (gastroesophageal reflux disease)    Hematuria    Hypercholesterolemia    Hypertension    Hypothyroidism    Kidney stones    Past Surgical History:  Procedure Laterality Date   APPENDECTOMY     BREAST BIOPSY  1969   right   CHOLECYSTECTOMY  2005   COLONOSCOPY     COLONOSCOPY WITH PROPOFOL N/A 09/19/2017   Procedure: COLONOSCOPY WITH PROPOFOL;  Surgeon: Toledo, Benay Pike, MD;  Location: [facility]  ENDOSCOPY;  Service: Gastroenterology;  Laterality: N/A;   ESOPHAGOGASTRODUODENOSCOPY (EGD) WITH PROPOFOL N/A 09/19/2017   Procedure: ESOPHAGOGASTRODUODENOSCOPY (EGD) WITH PROPOFOL;  Surgeon: Toledo, Benay Pike, MD;  Location: [facility] ENDOSCOPY;  Service: Gastroenterology;  Laterality: N/A;   LEFT HEART CATH AND CORONARY ANGIOGRAPHY Left 08/07/2017   Procedure: LEFT HEART CATH AND CORONARY ANGIOGRAPHY;  Surgeon: Wellington Hampshire, MD;  Location: [facility];  Service: Cardiovascular;  Laterality: Left;   UPPER GI ENDOSCOPY     Family History  Problem Relation Age of Onset   Brain cancer Mother        history of brain tumor   Lung disease Father    Diabetes Brother        x2   Bipolar disorder Son    Alcohol abuse Son    Breast cancer Neg Hx    Colon cancer Neg Hx    Social History   Socioeconomic History   Marital status: Married    Spouse name: Not on file   Number of children: Not on file   Years of education: Not on file   Highest education level: Not on file  Occupational History   Not on file  Tobacco Use   Smoking status: Every Day    Packs/day: 1.00    Years: 50.00    Pack years: 50.00  Types: Cigarettes   Smokeless tobacco: Never  Vaping Use   Vaping Use: Never used  Substance and Sexual Activity   Alcohol use: No    Alcohol/week: 0.0 standard drinks   Drug use: No   Sexual activity: Not on file  Other Topics Concern   Not on file  Social History Narrative   Not on file   Social Determinants of Health   Financial Resource Strain: Medium Risk   Difficulty of Paying Living Expenses: Somewhat hard  Food Insecurity: No Food Insecurity   Worried About Running Out of Food in the Last Year: Never true   Ran Out of Food in the Last Year: Never true  Transportation Needs: No Transportation Needs   Lack of Transportation (Medical): No   Lack of Transportation (Non-Medical): No  Physical Activity: Insufficiently Active   Days of Exercise per Week: 3 days    Minutes of Exercise per Session: 30 min  Stress: Stress Concern Present   Feeling of Stress : Rather much  Social Connections: Unknown   Frequency of Communication with Friends and Family: Not on file   Frequency of Social Gatherings with Friends and Family: Not on file   Attends Religious Services: Not on file   Active Member of Clubs or Organizations: Not on file   Attends Archivist Meetings: Not on file   Marital Status: Married     Review of Systems  Constitutional:        Feels she is eating better now.  Weight is stable from last check.  Down from previous check.   HENT:  Negative for congestion and sinus pressure.   Respiratory:  Negative for cough, chest tightness and shortness of breath.   Cardiovascular:  Negative for chest pain and palpitations.  Gastrointestinal:  Negative for abdominal pain, diarrhea, nausea and vomiting.  Genitourinary:  Negative for difficulty urinating and dysuria.  Musculoskeletal:  Negative for joint swelling and myalgias.  Skin:  Negative for color change and rash.  Neurological:  Negative for dizziness, light-headedness and headaches.  Psychiatric/Behavioral:  Negative for agitation.        Increased stress as outlined.        Objective:     BP 138/78   Pulse 71   Temp 97.8 F (36.6 C)   Resp 16   Ht 5' 2"  (1.575 m)   Wt 121 lb 9.6 oz (55.2 kg)   LMP 03/10/1991   SpO2 99%   BMI 22.24 kg/m  Wt Readings from Last 3 Encounters:  12/15/20 121 lb 9.6 oz (55.2 kg)  09/29/20 121 lb 6.4 oz (55.1 kg)  07/28/20 125 lb (56.7 kg)    Physical Exam Vitals reviewed.  Constitutional:      General: She is not in acute distress.    Appearance: Normal appearance.  HENT:     Head: Normocephalic and atraumatic.     Right Ear: External ear normal.     Left Ear: External ear normal.  Eyes:     General: No scleral icterus.       Right eye: No discharge.        Left eye: No discharge.     Conjunctiva/sclera: Conjunctivae normal.   Neck:     Thyroid: No thyromegaly.  Cardiovascular:     Rate and Rhythm: Normal rate and regular rhythm.  Pulmonary:     Effort: No respiratory distress.     Breath sounds: Normal breath sounds. No wheezing.  Abdominal:     General:  Bowel sounds are normal.     Palpations: Abdomen is soft.     Tenderness: There is no abdominal tenderness.  Musculoskeletal:        General: No swelling or tenderness.     Cervical back: Neck supple. No tenderness.  Lymphadenopathy:     Cervical: No cervical adenopathy.  Skin:    Findings: No erythema or rash.  Neurological:     Mental Status: She is alert.  Psychiatric:        Mood and Affect: Mood normal.        Behavior: Behavior normal.     Outpatient Encounter Medications as of 12/15/2020  Medication Sig   aspirin 81 MG tablet Take 81 mg by mouth daily.   atorvastatin (LIPITOR) 20 MG tablet Take 1 tablet (20 mg total) by mouth daily.   blood glucose meter kit and supplies Dispense based on patient and insurance preference. Use up to four times daily as directed. (FOR ICD-10 E10.9, E11.9).   dapagliflozin propanediol (FARXIGA) 10 MG TABS tablet Take 10 mg by mouth in the morning.   glucose blood test strip Use as instructed to blood sugar twice daily   levothyroxine (SYNTHROID) 112 MCG tablet TAKE 1 TABLET(112 MCG) BY MOUTH DAILY BEFORE  BREAKFAST   lisinopril (ZESTRIL) 5 MG tablet Take 1 tablet (5 mg total) by mouth daily.   pantoprazole (PROTONIX) 40 MG tablet Take 1 tablet (40 mg total) by mouth daily as needed.   Semaglutide (OZEMPIC, 0.25 OR 0.5 MG/DOSE, ) Inject 0.5 mg into the skin once a week.   triamcinolone (KENALOG) 0.1 % Apply 1 application topically 2 (two) times daily.   vitamin B-12 (CYANOCOBALAMIN) 1000 MCG tablet Take 1,000 mcg by mouth daily.   [DISCONTINUED] carvedilol (COREG) 3.125 MG tablet Take 1 tablet (3.125 mg total) by mouth 2 (two) times daily.   No facility-administered encounter medications on file as of  12/15/2020.     Lab Results  Component Value Date   WBC 5.8 12/14/2020   HGB 13.7 12/14/2020   HCT 42.4 12/14/2020   PLT 113.0 (L) 12/14/2020   GLUCOSE 170 (H) 12/14/2020   CHOL 120 12/14/2020   TRIG 182.0 (H) 12/14/2020   HDL 37.30 (L) 12/14/2020   LDLDIRECT 71 10/18/2017   LDLCALC 46 12/14/2020   ALT 15 12/14/2020   AST 13 12/14/2020   NA 139 12/14/2020   K 4.5 12/14/2020   CL 105 12/14/2020   CREATININE 1.09 12/14/2020   BUN 18 12/14/2020   CO2 26 12/14/2020   TSH 2.45 12/14/2020   INR 0.87 08/07/2017   HGBA1C 7.4 (H) 12/14/2020   MICROALBUR <0.7 08/13/2019    DG Bone Density  Result Date: 06/15/2020 EXAM: DUAL X-RAY ABSORPTIOMETRY (DXA) FOR BONE MINERAL DENSITY IMPRESSION: Your patient Porche Steinberger completed a BMD test on 06/15/2020 using the [place] (software version: 14.10) manufactured by [employer]. The following summarizes the results of our evaluation. Technologist: SCE PATIENT BIOGRAPHICAL: Name: Larken, Urias Patient ID: [id number] Birth Date: May 09, 1945 Height: 62.0 in. Gender: Female Exam Date: 06/15/2020 Weight: 127.1 lbs. Indications: Advanced Age, Diabetic, Early Menopause, History of Fracture (Adult), Hypothyroid, Postmenopausal, Tobacco User (Current Smoker) Fractures: fingers, Left wrist, Toes Treatments: Farxiga, Levothyroxine, Ozempic Injection DENSITOMETRY RESULTS: Site      Region    Measured Date Measured Age WHO Classification Young Adult T-score BMD         %Change vs. Previous Significant Change (*) AP Spine L1-L3 06/15/[age] 74.5 Normal  0.9 1.296 g/cm2 - - DualFemur Neck Left 06/15/2020 74.5 Osteopenia -1.4 0.850 g/cm2 - - ASSESSMENT: The BMD measured at Femur Neck Left is 0.850 g/cm2 with a T-score of -1.4. This patient is considered osteopenic according to [facility]) criteria. The scan quality is good. L4 was excluded due to degenerative changes. World Pharmacologist [facility]) criteria for post-menopausal,  Caucasian Women: Normal:                   T-score at or above -1 SD Osteopenia/low bone mass: T-score between -1 and -2.5 SD Osteoporosis:             T-score at or below -2.5 SD RECOMMENDATIONS: 1. All patients should optimize calcium and vitamin D intake. 2. Consider FDA-approved medical therapies in postmenopausal women and men aged 61 years and older, based on the following: a. A hip or vertebral(clinical or morphometric) fracture b. T-score < -2.5 at the femoral neck or spine after appropriate evaluation to exclude secondary causes c. Low bone mass (T-score between -1.0 and -2.5 at the femoral neck or spine) and a 10-year probability of a hip fracture > 3% or a 10-year probability of a major osteoporosis-related fracture > 20% based on the [place]-adapted WHO algorithm 3. Clinician judgment and/or patient preferences may indicate treatment for people with 10-year fracture probabilities above or below these levels FOLLOW-UP: People with diagnosed cases of osteoporosis or at high risk for fracture should have regular bone mineral density tests. For patients eligible for Medicare, routine testing is allowed once every 2 years. The testing frequency can be increased to one year for patients who have rapidly progressing disease, those who are receiving or discontinuing medical therapy to restore bone mass, or have additional risk factors. I have reviewed this report, and agree with the above findings. [facility] Dear Dr Nicki Reaper, Your patient EBONY YORIO completed a FRAX assessment on 06/15/2020 using the [place] (analysis version: 14.10) manufactured by [employer]. The following summarizes the results of our evaluation. PATIENT BIOGRAPHICAL: Name: Malissie, Musgrave Patient ID: [id number] Birth Date: 19-Dec-1945 Height:    62.0 in. Gender:     [age]    [age]       Weight:    127.1 lbs. Ethnicity:  White                            Exam Date: 06/15/2020 FRAX* RESULTS:  (version:  3.5) 10-year Probability of Fracture1 Major Osteoporotic Fracture2 Hip Fracture 16.1% 4.4% Population: [place] (Caucasian) Risk Factors: History of Fracture (Adult), Tobacco User (Current Smoker) Based on Femur (Left) Neck BMD 1 -The 10-year probability of fracture may be lower than reported if the patient has received treatment. 2 -Major Osteoporotic Fracture: Clinical Spine, Forearm, Hip or Shoulder *FRAX is a Materials engineer of the [employer] for Metabolic Bone Disease, a [place] (WHO) [facility]. ASSESSMENT: The probability of a major osteoporotic fracture is 16.1% within the next ten years. The probability of a hip fracture is 4.4% within the next ten years. . I have reviewed this report and agree with the above findings. Mark A. Thornton Papas, M.D. [facility] Electronically Signed   By: Lavonia Dana M.D.   On: 06/15/2020 16:21   MM 3D SCREEN BREAST BILATERAL  Result Date: 06/15/2020 CLINICAL DATA:  Screening. EXAM: DIGITAL SCREENING BILATERAL MAMMOGRAM WITH TOMOSYNTHESIS  AND CAD TECHNIQUE: Bilateral screening digital craniocaudal and mediolateral oblique mammograms were obtained. Bilateral screening digital breast tomosynthesis was performed. The images were evaluated with computer-aided detection. COMPARISON:  Previous exam(s). ACR Breast Density Category b: There are scattered areas of fibroglandular density. FINDINGS: There are no findings suspicious for malignancy. The images were evaluated with computer-aided detection. IMPRESSION: No mammographic evidence of malignancy. A result letter of this screening mammogram will be mailed directly to the patient. RECOMMENDATION: Screening mammogram in one year. (Code:SM-B-01Y) BI-RADS CATEGORY  1: Negative. Electronically Signed   By: Audie Pinto M.D.   On: 06/15/2020 15:26       Assessment & Plan:   Problem List Items Addressed This Visit     Aortic atherosclerosis ([place])    Continue lipitor.        Diabetes mellitus ([place]) - Primary    She is on ozempic.  Discussed GI side effects.  Weight is down. She feels her appetite has improved.  Wants to continue on current medication regimen.  Follow weight.  Follow for GI side effects.  Follow met b and a1c.       Relevant Orders   Hemoglobin A1c   GERD (gastroesophageal reflux disease)    No upper symptoms reported. On protonix.       Hypercholesterolemia    Continue lipitor.  Follow lipid panel and liver function tests.        Relevant Orders   Hepatic function panel   Lipid panel   Hypertension    Continue lisinopril.  Blood pressure doing well.  Hold on making any changes. Follow pressures.  Follow metabolic panel.       Relevant Orders   Basic metabolic panel   Hypothyroidism    On thyroid replacement.  Follow tsh.      Nasal congestion    Saline nasal spray and nasacort nasal spray as directed.  Follow.       Personal history of tobacco use, presenting hazards to health    Discussed the need to quit.  Desires not to quit at this time.  Has been getting screening CT chest.  States is overdue. Has not been contacted.  F/u and see about getting scheduled.        Stress    Increased stress as outlined.  Discussed.  Does not feel needs any further intervention.  Follow.        Thrombocytopenia ([place])    Follow cbc.        Weight loss    Stable from last check, but down overall.  States appetite is better.  Colonoscopy 2019.  Follow.          Einar Pheasant, MD

## 2020-12-15 NOTE — Patient Instructions (Signed)
Saline nasal spray - flush nose once or twice a day  Nasacort nasal spray - 2 sprays each nostril one time per day.  Do this in the evening.

## 2020-12-20 ENCOUNTER — Encounter: Payer: Self-pay | Admitting: Internal Medicine

## 2020-12-20 DIAGNOSIS — R0981 Nasal congestion: Secondary | ICD-10-CM | POA: Insufficient documentation

## 2020-12-20 DIAGNOSIS — F439 Reaction to severe stress, unspecified: Secondary | ICD-10-CM | POA: Insufficient documentation

## 2020-12-20 NOTE — Assessment & Plan Note (Signed)
Continue lisinopril.  Blood pressure doing well.  Hold on making any changes. Follow pressures.  Follow metabolic panel.

## 2020-12-20 NOTE — Assessment & Plan Note (Signed)
Increased stress as outlined.  Discussed.  Does not feel needs any further intervention.  Follow.  

## 2020-12-20 NOTE — Assessment & Plan Note (Signed)
Continue lipitor.  Follow lipid panel and liver function tests.   

## 2020-12-20 NOTE — Assessment & Plan Note (Signed)
Follow cbc.  

## 2020-12-20 NOTE — Assessment & Plan Note (Signed)
No upper symptoms reported.  On protonix.   

## 2020-12-20 NOTE — Assessment & Plan Note (Signed)
Continue lipitor  ?

## 2020-12-20 NOTE — Assessment & Plan Note (Addendum)
Discussed the need to quit.  Desires not to quit at this time.  Has been getting screening CT chest.  States is overdue. Has not been contacted.  F/u and see about getting scheduled.

## 2020-12-20 NOTE — Assessment & Plan Note (Signed)
She is on ozempic.  Discussed GI side effects.  Weight is down. She feels her appetite has improved.  Wants to continue on current medication regimen.  Follow weight.  Follow for GI side effects.  Follow met b and a1c.

## 2020-12-20 NOTE — Assessment & Plan Note (Signed)
Saline nasal spray and nasacort nasal spray as directed.  Follow.

## 2020-12-20 NOTE — Assessment & Plan Note (Signed)
Stable from last check, but down overall.  States appetite is better.  Colonoscopy 2019.  Follow.

## 2020-12-20 NOTE — Assessment & Plan Note (Signed)
On thyroid replacement.  Follow tsh.  

## 2021-01-01 ENCOUNTER — Other Ambulatory Visit: Payer: Self-pay

## 2021-01-01 DIAGNOSIS — F17210 Nicotine dependence, cigarettes, uncomplicated: Secondary | ICD-10-CM

## 2021-01-01 DIAGNOSIS — Z87891 Personal history of nicotine dependence: Secondary | ICD-10-CM

## 2021-01-04 ENCOUNTER — Telehealth: Payer: Self-pay | Admitting: Internal Medicine

## 2021-01-04 NOTE — Telephone Encounter (Signed)
-----   Message from Magdalen Spatz, NP sent at 12/30/2020  9:07 AM EST ----- Regarding: RE: question - screening CT chest I will send this to my nurse, and [facility] to get scheduled. Thanks so much ----- Message ----- From: Einar Pheasant, MD Sent: 12/20/2020   4:08 PM EST To: Magdalen Spatz, NP Subject: question - screening CT chest                  I was not sure who to contact.  Your name was given as a contact.  Ms Nilsson [place] a pt of mine that has been receiving CT chest screenings for the last few years.  She informed me she had not been notified this year - about scheduling.  Do I just need to reorder the scan?   I was not sure how the scans were being followed for the patients that had already been receiving regular f/u scans (and scheduling).  Thank you for your help.    Einar Pheasant

## 2021-01-05 ENCOUNTER — Ambulatory Visit
Admission: RE | Admit: 2021-01-05 | Discharge: 2021-01-05 | Disposition: A | Discharge status: Home / Self Care | Payer: Medicare Other | Source: Ambulatory Visit | Attending: Acute Care | Admitting: Acute Care

## 2021-01-05 ENCOUNTER — Other Ambulatory Visit: Payer: Self-pay

## 2021-01-05 DIAGNOSIS — F17210 Nicotine dependence, cigarettes, uncomplicated: Secondary | ICD-10-CM | POA: Diagnosis not present

## 2021-01-05 DIAGNOSIS — Z87891 Personal history of nicotine dependence: Secondary | ICD-10-CM

## 2021-01-14 DIAGNOSIS — Z20822 Contact with and (suspected) exposure to covid-19: Secondary | ICD-10-CM | POA: Diagnosis not present

## 2021-01-15 ENCOUNTER — Ambulatory Visit: Payer: Medicare Other | Admitting: Pharmacist

## 2021-01-15 DIAGNOSIS — E7800 Pure hypercholesterolemia, unspecified: Secondary | ICD-10-CM

## 2021-01-15 DIAGNOSIS — E1165 Type 2 diabetes mellitus with hyperglycemia: Secondary | ICD-10-CM

## 2021-01-15 DIAGNOSIS — I10 Essential (primary) hypertension: Secondary | ICD-10-CM

## 2021-01-15 MED ORDER — DAPAGLIFLOZIN PROPANEDIOL 10 MG PO TABS: 10.0000 mg | ORAL_TABLET | Freq: Every morning | ORAL | 3 refills | Status: DC

## 2021-01-15 NOTE — Chronic Care Management (AMB) (Signed)
Chronic Care Management CCM Pharmacy Note  01/15/2021 Name:  Ann Collins MRN:  [id number] DOB:  [birth year]  Summary: - Due to reapply for patient assistance   Recommendations/Changes made from today's visit: - Continue current regimen at this time. Reapplying for Ozempic and Farxiga assistance for 2023.  Subjective: Ann Collins is an 75 y.o. year old female who is a primary patient of Einar Pheasant, MD.  The CCM team was consulted for assistance with disease management and care coordination needs.    Engaged with patient by telephone for follow up visit for pharmacy case management and/or care coordination services.   Objective:  Medications Reviewed Today     Reviewed by De Hollingshead, RPH-CPP (Pharmacist) on 01/15/21 at 0909  Med List Status: <None>   Medication Order Taking? Sig Documenting Provider Last Dose Status Informant  aspirin 81 MG tablet [id number] Yes Take 81 mg by mouth daily. [provider] Taking Active   atorvastatin (LIPITOR) 20 MG tablet [id number] Yes Take 1 tablet (20 mg total) by mouth daily. Einar Pheasant, MD Taking Active   blood glucose meter kit and supplies [id number]  Dispense based on patient and insurance preference. Use up to four times daily as directed. (FOR ICD-10 E10.9, E11.9). Einar Pheasant, MD  Active   carvedilol (COREG) 3.125 MG tablet [id number] Yes TAKE 1 TABLET(3.125 MG) BY MOUTH TWICE DAILY Wellington Hampshire, MD Taking Active   dapagliflozin propanediol (FARXIGA) 10 MG TABS tablet [id number] Yes Take 10 mg by mouth in the morning. [provider] Taking Active   glucose blood test strip [id number]  Use as instructed to blood sugar twice daily Einar Pheasant, MD  Active   levothyroxine (SYNTHROID) 112 MCG tablet [id number] Yes TAKE 1 TABLET(112 MCG) BY MOUTH DAILY BEFORE  Laveda Norman, MD Taking Active   lisinopril (ZESTRIL) 5 MG tablet [id number] Yes Take 1 tablet (5 mg total) by mouth daily.  Einar Pheasant, MD Taking Active   pantoprazole (PROTONIX) 40 MG tablet [id number] Yes Take 1 tablet (40 mg total) by mouth daily as needed. Einar Pheasant, MD Taking Active   Semaglutide [facility], 0.25 OR 0.5 MG/DOSE, [place]) [id number] Yes Inject 0.5 mg into the skin once a week. [provider] Taking Active   triamcinolone (KENALOG) 0.1 % [id number] No Apply 1 application topically 2 (two) times daily.  Patient not taking: Reported on 01/15/2021   Einar Pheasant, MD Not Taking Active   vitamin B-12 (CYANOCOBALAMIN) 1000 MCG tablet [id number] Yes Take 1,000 mcg by mouth daily. [provider] Taking Active             Pertinent Labs:   Lab Results  Component Value Date   HGBA1C 7.4 (H) 12/14/2020   Lab Results  Component Value Date   CHOL 120 12/14/2020   HDL 37.30 (L) 12/14/2020   LDLCALC 46 12/14/2020   LDLDIRECT 71 10/18/2017   TRIG 182.0 (H) 12/14/2020   CHOLHDL 3 12/14/2020   Lab Results  Component Value Date   CREATININE 1.09 12/14/2020   BUN 18 12/14/2020   NA 139 12/14/2020   K 4.5 12/14/2020   CL 105 12/14/2020   CO2 26 12/14/2020    SDOH:  (Social Determinants of Health) assessments and interventions performed:  SDOH Interventions    Flowsheet Row Most Recent Value  SDOH Interventions   Financial Strain Interventions Other (Comment)  [manufacturer assistance]       CCM Care Plan  Review of patient past medical history, allergies, medications,  health status, including review of consultants reports, laboratory and other test data, was performed as part of comprehensive evaluation and provision of chronic care management services.   Care Plan : Medication Management  Updates made by De Hollingshead, RPH-CPP since 01/15/2021 12:00 AM     Problem: Diabetes, HLD, CKD      Long-Range Goal: Disease Progression Prevention   Recent Progress: On track  Priority: High  Note:   Current Barriers:  Unable to independently afford treatment  regimen Unable to independently monitor therapeutic efficacy Unable to maintain control of diabetes  Pharmacist Clinical Goal(s):  Over the next 90 days, patient will verbalize ability to afford treatment regimen Over the next 90 days, patient will achieve control of diabetes as evidenced by A1c through collaboration with PharmD and provider.   Interventions: 1:1 collaboration with Einar Pheasant, MD regarding development and update of comprehensive plan of care as evidenced by provider attestation and co-signature Inter-disciplinary care team collaboration (see longitudinal plan of care) Comprehensive medication review performed; medication list updated in electronic medical record  Health Maintenance   Yearly diabetic eye exam: up to date Yearly diabetic foot exam: up to date Urine microalbumin: up to date Yearly influenza vaccination: up to date Td/Tdap vaccination: up to date Pneumonia vaccination: up to date COVID vaccinations: up to date  Shingrix vaccinations: due - recommended to pursue in 2023 Colonoscopy: up to date Bone density scan: up to date Mammogram: up to date  Diabetes: Controlled at relaxed goal of 7.5% current treatment: Farxiga 10 mg daily, Ozempic 0.5 mg weekly Hx metformin- stopped after a 2019 hospitalization, reason for discontinuation unclear  Hx Tresiba - discontinued when no longer needed Receives Farxiga from [employer] patient assistance, Ozempic from [employer] patient assistance Current blood sugar readings: has not been checking lately due to family stress. One daughter was IVC, discharged yesterday. Other daughter is having surgery, daughter in law just had a miscarriage.  Provided empathetic listening  Will collaborate w/ CPhT, patient, and providers to reapply for patient assistance for Franne Grip for 2023.  Hypertension: Controlled per last clinic reading; current treatment: lisinopril 5 mg daily; carvedilol 3.125 mg BID Home BP  readings: has not been checking lately. Reports one episode (thanksgiving) where she took lisinopril, carvedilol, and Farxiga at the same time and had lightheadedness, dizziness and threw up. Reports she normally separates out her medications and has no issues. Wonders about stopping lisinopril Reviewed CKD benefit of ACEI. Advised to continue current regimen but taking lisinopril at night. Can consider dose reduction moving forward if needed.    Hyperlipidemia and ASCVD risk reduction: Controlled per last lipid panel; current treatment: atorvastatin 20 mg daily Current antiplatelet regimen: aspirin 81 mg daily Recommended to continue current regimen at this time.   Hypothyroidism: Controlled per last lab work; current regimen: levothyroxine 112 mcg daily Recommended to continue current regimen at this time.   Tobacco Abuse: 1 packs per day; denies being in a place to be able to work on tobacco cessation d/t stressors in her life.  Continue to evaluate readiness to quit and remind of health benefits and benefits for those around her. Advised to let us know when she is ready to consider reduction or cessation   Acid Reflux: Controlled per patient report; current regimen: pantoprazole 40 mg PRN Previously recommended to continue current regimen at this time. Will discuss ability for trial of H2RA instead in the future.   Osteopenia: DEXA 06/2020: - t score -1.4, osteopenia; FRAX  calculation of risk of major osteoporotic fracture of 16%, risk of hip fracture of 4.5% hip Consider treatment given risk of hip fracture >3%. Previously encouraged to discuss w/ PCP at next appointment.   Supplements: Vitamin B12 1000 mcg gummy  Patient Goals/Self-Care Activities Over the next 90 days, patient will:  - take medications as prescribed focus on medication adherence by utilizing pill box check glucose daily, alternating between fasting and post prandial, document, and provide at future  appointments check blood pressure daily, document, and provide at future appointments collaborate with provider on medication access solutions      Plan: Telephone follow up appointment with care management team member scheduled for:  3 months  [name], PharmD, [place], CPP Clinical Pharmacist [employer] at [employer] [phone]

## 2021-01-15 NOTE — Patient Instructions (Signed)
Visit Information  Following are the goals we discussed today:  Patient Goals/Self-Care Activities Over the next 90 days, patient will:  - take medications as prescribed focus on medication adherence by utilizing pill box check glucose daily, alternating between fasting and post prandial, document, and provide at future appointments check blood pressure daily, document, and provide at future appointments collaborate with provider on medication access solutions        Plan: Telephone follow up appointment with care management team member scheduled for:  3 months   [name], PharmD, [place], CPP Clinical Pharmacist [place] at [facility] [phone]   Please call the care guide team at [phone] if you need to cancel or reschedule your appointment.   Patient verbalizes understanding of instructions provided today and agrees to view in [place].

## 2021-01-19 ENCOUNTER — Telehealth: Payer: Self-pay | Admitting: Pharmacy Technician

## 2021-01-19 DIAGNOSIS — Z596 Low income: Secondary | ICD-10-CM

## 2021-01-19 NOTE — Progress Notes (Signed)
[place] [facility])                                            [place] Team    01/19/2021  LYNITA GROSECLOSE 09/05/1945 [id number]  FOR 2023 RE ENROLLMENT                                      Medication Assistance Referral  Referral From: [facility] Embedded RPh [name] T.   Medication/Company: [name] / Novo Nordisk Patient application portion:  Education officer, museum portion: Interoffice Mailed to Dr. Nicki Reaper Provider address/fax verified via: Office website  Medication/Company: [name] / [place] Patient application portion: Since patient is enrolled for 2022 and has Medicare Part D per program guidelines she should be automatically re enrolled for 5277 Provider application portion: Interoffice Mailed to Dr. Nicki Reaper Provider address/fax verified via: Office website  Received provider portion(s) of patient assistance application(s) for [place]. Embedded PharmD escribed completed prescription into [place].  Carey Lafon P. Imanol Bihl, [place]  [phone]

## 2021-01-27 ENCOUNTER — Other Ambulatory Visit: Payer: Self-pay | Admitting: Acute Care

## 2021-01-27 DIAGNOSIS — Z87891 Personal history of nicotine dependence: Secondary | ICD-10-CM

## 2021-01-27 DIAGNOSIS — F17210 Nicotine dependence, cigarettes, uncomplicated: Secondary | ICD-10-CM

## 2021-01-28 ENCOUNTER — Other Ambulatory Visit: Payer: Self-pay | Admitting: Internal Medicine

## 2021-01-28 DIAGNOSIS — R911 Solitary pulmonary nodule: Secondary | ICD-10-CM | POA: Insufficient documentation

## 2021-01-28 DIAGNOSIS — R9389 Abnormal findings on diagnostic imaging of other specified body structures: Secondary | ICD-10-CM

## 2021-01-28 NOTE — Progress Notes (Signed)
CT chest has been ordered.

## 2021-02-12 ENCOUNTER — Telehealth: Payer: Self-pay | Admitting: Pharmacy Technician

## 2021-02-12 DIAGNOSIS — Z596 Low income: Secondary | ICD-10-CM

## 2021-02-12 NOTE — Progress Notes (Signed)
[facility] [place] [facility])                                            [place] Team    02/12/2021  PAOLINA KARWOWSKI 06/21/1945 [id number]  Care coordination call placed to [employer] in regard to [facility] application.  Spoke to [place] who informs patient is APPROVED 02/07/21-02/06/2022. Medication will ship in January automatically based on last fill date in 2022.  Kaleeyah Cuffie P. Artavius Stearns, [place]  [phone]

## 2021-02-19 ENCOUNTER — Other Ambulatory Visit: Payer: Self-pay

## 2021-02-19 ENCOUNTER — Ambulatory Visit (INDEPENDENT_AMBULATORY_CARE_PROVIDER_SITE_OTHER): Payer: Medicare Other | Admitting: Pharmacist

## 2021-02-19 ENCOUNTER — Ambulatory Visit (INDEPENDENT_AMBULATORY_CARE_PROVIDER_SITE_OTHER): Payer: Medicare Other | Admitting: Internal Medicine

## 2021-02-19 DIAGNOSIS — E7800 Pure hypercholesterolemia, unspecified: Secondary | ICD-10-CM

## 2021-02-19 DIAGNOSIS — F439 Reaction to severe stress, unspecified: Secondary | ICD-10-CM

## 2021-02-19 DIAGNOSIS — E1165 Type 2 diabetes mellitus with hyperglycemia: Secondary | ICD-10-CM | POA: Diagnosis not present

## 2021-02-19 DIAGNOSIS — R911 Solitary pulmonary nodule: Secondary | ICD-10-CM | POA: Diagnosis not present

## 2021-02-19 DIAGNOSIS — K2100 Gastro-esophageal reflux disease with esophagitis, without bleeding: Secondary | ICD-10-CM | POA: Diagnosis not present

## 2021-02-19 DIAGNOSIS — R634 Abnormal weight loss: Secondary | ICD-10-CM | POA: Diagnosis not present

## 2021-02-19 DIAGNOSIS — Z87891 Personal history of nicotine dependence: Secondary | ICD-10-CM

## 2021-02-19 DIAGNOSIS — I700 Atherosclerosis of aorta: Secondary | ICD-10-CM | POA: Diagnosis not present

## 2021-02-19 DIAGNOSIS — I10 Essential (primary) hypertension: Secondary | ICD-10-CM

## 2021-02-19 DIAGNOSIS — D696 Thrombocytopenia, unspecified: Secondary | ICD-10-CM | POA: Diagnosis not present

## 2021-02-19 DIAGNOSIS — E039 Hypothyroidism, unspecified: Secondary | ICD-10-CM

## 2021-02-19 NOTE — Progress Notes (Signed)
Patient ID: Ann Collins, female   DOB: 1945-12-05, 76 y.o.   MRN: [id number]   Subjective:    Patient ID: Ann Collins, female    DOB: 1945/07/16, 76 y.o.   MRN: [id number]  This visit occurred during the SARS-CoV-2 public health emergency.  Safety protocols were in place, including screening questions prior to the visit, additional usage of staff PPE, and extensive cleaning of exam room while observing appropriate contact time as indicated for disinfecting solutions.   Patient here for a scheduled follow up .   HPI Here to follow up regarding her blood pressure, blood sugars and cholesterol.  Still with increased family stress.  Discussed.  She does not feel she needs any further intervention.  Feels handling stress.  No chest pain or sob reported.  Continues to smoke.  Recent CT - new lung nodule.  Discussed.  Discussed pulmonary referral.  No abdominal pain.  No nausea or vomiting.  Eating.  Gets full faster.  On ozempic.  Did not bring in any recorded sugar readings.  Bowels stable.  Wants to remain on ozempic.    Past Medical History:  Diagnosis Date   Abdominal hernia    CAD (coronary artery disease)    a. LHC 7/19: LM nl, p-mLAD 100% w/ right to left collaterals, OM2 90% (2 mm), 1st LPL 40%, medical management   Diabetes mellitus ([place])    Diverticulosis    GERD (gastroesophageal reflux disease)    Hematuria    Hypercholesterolemia    Hypertension    Hypothyroidism    Kidney stones    Past Surgical History:  Procedure Laterality Date   APPENDECTOMY     BREAST BIOPSY  1969   right   CHOLECYSTECTOMY  2005   COLONOSCOPY     COLONOSCOPY WITH PROPOFOL N/A 09/19/2017   Procedure: COLONOSCOPY WITH PROPOFOL;  Surgeon: Toledo, Benay Pike, MD;  Location: [facility] ENDOSCOPY;  Service: Gastroenterology;  Laterality: N/A;   ESOPHAGOGASTRODUODENOSCOPY (EGD) WITH PROPOFOL N/A 09/19/2017   Procedure: ESOPHAGOGASTRODUODENOSCOPY (EGD) WITH PROPOFOL;  Surgeon: Toledo, Benay Pike, MD;   Location: [facility] ENDOSCOPY;  Service: Gastroenterology;  Laterality: N/A;   LEFT HEART CATH AND CORONARY ANGIOGRAPHY Left 08/07/2017   Procedure: LEFT HEART CATH AND CORONARY ANGIOGRAPHY;  Surgeon: Wellington Hampshire, MD;  Location: [place] [facility];  Service: Cardiovascular;  Laterality: Left;   UPPER GI ENDOSCOPY     Family History  Problem Relation Age of Onset   Brain cancer Mother        history of brain tumor   Lung disease Father    Diabetes Brother        x2   Bipolar disorder Son    Alcohol abuse Son    Breast cancer Neg Hx    Colon cancer Neg Hx    Social History   Socioeconomic History   Marital status: Married    Spouse name: Not on file   Number of children: Not on file   Years of education: Not on file   Highest education level: Not on file  Occupational History   Not on file  Tobacco Use   Smoking status: Every Day    Packs/day: 1.00    Years: 50.00    Pack years: 50.00    Types: Cigarettes   Smokeless tobacco: Never  Vaping Use   Vaping Use: Never used  Substance and Sexual Activity   Alcohol use: No    Alcohol/week: 0.0 standard drinks   Drug use: No  Sexual activity: Not on file  Other Topics Concern   Not on file  Social History Narrative   Not on file   Social Determinants of Health   Financial Resource Strain: Medium Risk   Difficulty of Paying Living Expenses: Somewhat hard  Food Insecurity: No Food Insecurity   Worried About Running Out of Food in the Last Year: Never true   Ran Out of Food in the Last Year: Never true  Transportation Needs: No Transportation Needs   Lack of Transportation (Medical): No   Lack of Transportation (Non-Medical): No  Physical Activity: Insufficiently Active   Days of Exercise per Week: 3 days   Minutes of Exercise per Session: 30 min  Stress: Stress Concern Present   Feeling of Stress : Rather much  Social Connections: Unknown   Frequency of Communication with Friends and Family: Not on file    Frequency of Social Gatherings with Friends and Family: Not on file   Attends Religious Services: Not on file   Active Member of Clubs or Organizations: Not on file   Attends Archivist Meetings: Not on file   Marital Status: Married     Review of Systems  Constitutional:  Negative for appetite change and unexpected weight change.  HENT:  Negative for congestion and sinus pressure.   Respiratory:  Negative for cough, chest tightness and shortness of breath.   Cardiovascular:  Negative for chest pain, palpitations and leg swelling.  Gastrointestinal:  Negative for abdominal pain, diarrhea, nausea and vomiting.  Genitourinary:  Negative for difficulty urinating and dysuria.  Musculoskeletal:  Negative for joint swelling and myalgias.  Skin:  Negative for color change and rash.  Neurological:  Negative for dizziness, light-headedness and headaches.  Psychiatric/Behavioral:  Negative for agitation and dysphoric mood.        Increased stress as outlined.        Objective:     BP 112/70    Pulse 71    Temp 97.9 F (36.6 C)    Resp 16    Ht _0  (1.575 m)    Wt 119 lb 12.8 oz (54.3 kg)    LMP 03/10/1991    SpO2 99%    BMI 21.91 kg/m  Wt Readings from Last 3 Encounters:  02/19/21 119 lb 12.8 oz (54.3 kg)  12/15/20 121 lb 9.6 oz (55.2 kg)  09/29/20 121 lb 6.4 oz (55.1 kg)    Physical Exam Vitals reviewed.  Constitutional:      General: She is not in acute distress.    Appearance: Normal appearance.  HENT:     Head: Normocephalic and atraumatic.     Right Ear: External ear normal.     Left Ear: External ear normal.  Eyes:     General: No scleral icterus.       Right eye: No discharge.        Left eye: No discharge.     Conjunctiva/sclera: Conjunctivae normal.  Neck:     Thyroid: No thyromegaly.  Cardiovascular:     Rate and Rhythm: Normal rate and regular rhythm.  Pulmonary:     Effort: No respiratory distress.     Breath sounds: Normal breath sounds. No  wheezing.  Abdominal:     General: Bowel sounds are normal.     Palpations: Abdomen is soft.     Tenderness: There is no abdominal tenderness.  Musculoskeletal:        General: No swelling or tenderness.     Cervical back:  Neck supple. No tenderness.  Lymphadenopathy:     Cervical: No cervical adenopathy.  Skin:    Findings: No erythema or rash.  Neurological:     Mental Status: She is alert.  Psychiatric:        Mood and Affect: Mood normal.        Behavior: Behavior normal.     Outpatient Encounter Medications as of 02/19/2021  Medication Sig   aspirin 81 MG tablet Take 81 mg by mouth daily.   atorvastatin (LIPITOR) 20 MG tablet Take 1 tablet (20 mg total) by mouth daily.   blood glucose meter kit and supplies Dispense based on patient and insurance preference. Use up to four times daily as directed. (FOR ICD-10 E10.9, E11.9).   carvedilol (COREG) 3.125 MG tablet TAKE 1 TABLET(3.125 MG) BY MOUTH TWICE DAILY   dapagliflozin propanediol (FARXIGA) 10 MG TABS tablet Take 1 tablet (10 mg total) by mouth in the morning.   glucose blood test strip Use as instructed to blood sugar twice daily   levothyroxine (SYNTHROID) 112 MCG tablet TAKE 1 TABLET(112 MCG) BY MOUTH DAILY BEFORE  BREAKFAST   lisinopril (ZESTRIL) 5 MG tablet Take 1 tablet (5 mg total) by mouth daily.   pantoprazole (PROTONIX) 40 MG tablet Take 1 tablet (40 mg total) by mouth daily as needed.   Semaglutide (OZEMPIC, 0.25 OR 0.5 MG/DOSE, [place]) Inject 0.5 mg into the skin once a week.   triamcinolone (KENALOG) 0.1 % Apply 1 application topically 2 (two) times daily.   vitamin B-12 (CYANOCOBALAMIN) 1000 MCG tablet Take 1,000 mcg by mouth daily.   No facility-administered encounter medications on file as of 02/19/2021.     Lab Results  Component Value Date   WBC 5.8 12/14/2020   HGB 13.7 12/14/2020   HCT 42.4 12/14/2020   PLT 113.0 (L) 12/14/2020   GLUCOSE 170 (H) 12/14/2020   CHOL 120 12/14/2020   TRIG 182.0 (H)  12/14/2020   HDL 37.30 (L) 12/14/2020   LDLDIRECT 71 10/18/2017   LDLCALC 46 12/14/2020   ALT 15 12/14/2020   AST 13 12/14/2020   NA 139 12/14/2020   K 4.5 12/14/2020   CL 105 12/14/2020   CREATININE 1.09 12/14/2020   BUN 18 12/14/2020   CO2 26 12/14/2020   TSH 2.45 12/14/2020   INR 0.87 08/07/2017   HGBA1C 7.4 (H) 12/14/2020   MICROALBUR <0.7 08/13/2019    CT CHEST LUNG CA SCREEN LOW DOSE W/O CM  Result Date: 01/07/2021 CLINICAL DATA:  Current smoker with 54 pack-year history EXAM: CT CHEST WITHOUT CONTRAST LOW-DOSE FOR LUNG CANCER SCREENING TECHNIQUE: Multidetector CT imaging of the chest was performed following the standard protocol without IV contrast. COMPARISON:  CT lung cancer screening dated August 26, 2019 FINDINGS: Cardiovascular: Normal heart size. No pericardial effusion. Mild coronary artery calcifications of the LAD. Atherosclerotic disease of the thoracic aorta. Mediastinum/Nodes: Small hiatal hernia. Thyroid is unremarkable. No pathologically enlarged lymph nodes seen in the chest. New solid endobronchial nodule of the right middle lobe located on image 191 measuring 3.3 mm. Additional new small solid nodule of the right lower lobe measuring 2.7 mm on image 164. Additional bilateral solid and non solid pulmonary nodules are stable. Lungs/Pleura: Central airways are patent. Mild centrilobular emphysema. No consolidation, pleural effusion or pneumothorax. Upper Abdomen: Cholecystectomy clips. Exophytic left renal lesions which are likely simple cysts. No acute abnormality. Musculoskeletal: No chest wall mass or suspicious bone lesions identified. IMPRESSION: 1. New solid endobronchial nodule of the right middle  lobe measuring 3.3 mm.Lung-RADS 4A, suspicious. Follow up low-dose chest CT without contrast in 3 months (please use the following order, CT CHEST LCS NODULE FOLLOW-UP W/O CM) is recommended. Alternatively, PET may be considered when there is a solid component 18m or larger. 2.  Mild coronary artery calcifications of the LAD, recommend ASCVD risk assessment. 3. Aortic Atherosclerosis (ICD10-I70.0) and Emphysema (ICD10-J43.9). Electronically Signed   By: LYetta GlassmanM.D.   On: 01/07/2021 16:57       Assessment & Plan:   Problem List Items Addressed This Visit     Aortic atherosclerosis ([place]    Continue lipitor.       Diabetes mellitus ([place]    She is on ozempic.  Discussed GI side effects.  Weight is down. She feels her appetite has improved.  Wants to continue on current medication regimen.  Follow weight.  Follow for GI side effects.  Follow met b and a1c.       GERD (gastroesophageal reflux disease)    No upper symptoms reported. On protonix.       Hypercholesterolemia    Continue lipitor.  Follow lipid panel and liver function tests.        Hypertension    Continue lisinopril.  Blood pressure doing well.  Follow pressures.  Follow metabolic panel.       Hypothyroidism    On thyroid replacement.  Follow tsh.      Lung nodule    Found on screening CT (01/2021).  Recommended 3 month f/u or PET.       Personal history of tobacco use, presenting hazards to health    Discussed the need to quit.  Desires not to quit at this time.  Screening chest CT      Stress    Increased stress as outlined.  Discussed.  Does not feel needs any further intervention.  Follow.        Thrombocytopenia ([place]    Follow cbc.       Relevant Orders   CBC with Differential/Platelet   Weight loss    Lost another couple of pounds from last check.  Discussed.  Encourage increased po intake.  Follow.         CEinar Pheasant MD

## 2021-02-19 NOTE — Chronic Care Management (AMB) (Signed)
Chronic Care Management CCM Pharmacy Note  02/19/2021 Name:  Ann Collins MRN:  [id number] DOB:  1945-05-07  Summary: - Working on [employer] reapplication  Recommendations/Changes made from today's visit: - Completed patient portion. Need updated proof of income  Subjective: Ann Collins is an 76 y.o. year old female who is a primary patient of Einar Pheasant, MD.  The CCM team was consulted for assistance with disease management and care coordination needs.    Engaged with patient face to face for follow up visit for pharmacy case management and/or care coordination services.   Objective:  Medications Reviewed Today     Reviewed by Lars Masson, LPN (Licensed Practical Nurse) on 02/19/21 at 0746  Med List Status: <None>   Medication Order Taking? Sig Documenting Provider Last Dose Status Informant  aspirin 81 MG tablet [id number] Yes Take 81 mg by mouth daily. [provider] Taking Active   atorvastatin (LIPITOR) 20 MG tablet [id number] Yes Take 1 tablet (20 mg total) by mouth daily. Einar Pheasant, MD Taking Active   blood glucose meter kit and supplies [id number] Yes Dispense based on patient and insurance preference. Use up to four times daily as directed. (FOR ICD-10 E10.9, E11.9). Einar Pheasant, MD Taking Active   carvedilol (COREG) 3.125 MG tablet [id number] Yes TAKE 1 TABLET(3.125 MG) BY MOUTH TWICE DAILY Wellington Hampshire, MD Taking Active   dapagliflozin propanediol (FARXIGA) 10 MG TABS tablet [id number] Yes Take 1 tablet (10 mg total) by mouth in the morning. Einar Pheasant, MD Taking Active   glucose blood test strip [id number] Yes Use as instructed to blood sugar twice daily Einar Pheasant, MD Taking Active   levothyroxine (SYNTHROID) 112 MCG tablet [id number] Yes TAKE 1 TABLET(112 MCG) BY MOUTH DAILY BEFORE  Laveda Norman, MD Taking Active   lisinopril (ZESTRIL) 5 MG tablet [id number] Yes Take 1 tablet (5 mg total) by mouth daily. Einar Pheasant, MD Taking Active   pantoprazole (PROTONIX) 40 MG tablet [id number] Yes Take 1 tablet (40 mg total) by mouth daily as needed. Einar Pheasant, MD Taking Active   Semaglutide [facility], 0.25 OR 0.5 MG/DOSE, [place]) [id number] Yes Inject 0.5 mg into the skin once a week. [provider] Taking Active   triamcinolone (KENALOG) 0.1 % [id number] Yes Apply 1 application topically 2 (two) times daily. Einar Pheasant, MD Taking Active   vitamin B-12 (CYANOCOBALAMIN) 1000 MCG tablet [id number] Yes Take 1,000 mcg by mouth daily. [provider] Taking Active             Pertinent Labs:   Lab Results  Component Value Date   HGBA1C 7.4 (H) 12/14/2020   Lab Results  Component Value Date   CHOL 120 12/14/2020   HDL 37.30 (L) 12/14/2020   LDLCALC 46 12/14/2020   LDLDIRECT 71 10/18/2017   TRIG 182.0 (H) 12/14/2020   CHOLHDL 3 12/14/2020   Lab Results  Component Value Date   CREATININE 1.09 12/14/2020   BUN 18 12/14/2020   NA 139 12/14/2020   K 4.5 12/14/2020   CL 105 12/14/2020   CO2 26 12/14/2020    SDOH:  (Social Determinants of Health) assessments and interventions performed:  SDOH Interventions    Flowsheet Row Most Recent Value  SDOH Interventions   Financial Strain Interventions Other (Comment)  [manufacturer assistance]       CCM Care Plan  Review of patient past medical history, allergies, medications, health status, including review of consultants reports, laboratory and other test data,  was performed as part of comprehensive evaluation and provision of chronic care management services.   Care Plan : Medication Management  Updates made by De Hollingshead, RPH-CPP since 02/19/2021 12:00 AM     Problem: Diabetes, HLD, CKD      Long-Range Goal: Disease Progression Prevention   Recent Progress: On track  Priority: High  Note:   Current Barriers:  Unable to independently afford treatment regimen Unable to independently monitor therapeutic  efficacy Unable to maintain control of diabetes  Pharmacist Clinical Goal(s):  Over the next 90 days, patient will verbalize ability to afford treatment regimen Over the next 90 days, patient will achieve control of diabetes as evidenced by A1c through collaboration with PharmD and provider.   Interventions: 1:1 collaboration with Einar Pheasant, MD regarding development and update of comprehensive plan of care as evidenced by provider attestation and co-signature Inter-disciplinary care team collaboration (see longitudinal plan of care) Comprehensive medication review performed; medication list updated in electronic medical record  Health Maintenance   Yearly diabetic eye exam: up to date Yearly diabetic foot exam: up to date Urine microalbumin: up to date Yearly influenza vaccination: up to date Td/Tdap vaccination: up to date Pneumonia vaccination: up to date COVID vaccinations: up to date  Shingrix vaccinations: due - recommended to pursue in 2023 Colonoscopy: up to date Bone density scan: up to date Mammogram: up to date  Diabetes: Controlled at relaxed goal of 7.5% current treatment: Farxiga 10 mg daily, Ozempic 0.5 mg weekly Hx metformin- stopped after a 2019 hospitalization, reason for discontinuation unclear  Hx Tresiba - discontinued when no longer needed Approved for Farxiga assistance through 02/06/22. Collected patient portion of [employer] application, need updated proof of income.   Hypertension: Controlled; current treatment: lisinopril 5 mg daily; carvedilol 3.125 mg BID Previously reviewed CKD benefit of ACEI.  Hyperlipidemia and ASCVD risk reduction: Controlled per last lipid panel; current treatment: atorvastatin 20 mg daily Current antiplatelet regimen: aspirin 81 mg daily Previously recommended to continue current regimen at this time.   Hypothyroidism: Controlled per last lab work; current regimen: levothyroxine 112 mcg daily Previously recommended  to continue current regimen at this time.   Tobacco Abuse: 1 packs per day; denies being in a place to be able to work on tobacco cessation d/t stressors in her life.  Previously recommended to continue to evaluate readiness to quit and remind of health benefits and benefits for those around her. Advised to let us know when she is ready to consider reduction or cessation   Acid Reflux: Controlled per patient report; current regimen: pantoprazole 40 mg PRN Previously recommended to continue current regimen at this time. Will discuss ability for trial of H2RA instead in the future.   Osteopenia: DEXA 06/2020: - t score -1.4, osteopenia; FRAX calculation of risk of major osteoporotic fracture of 16%, risk of hip fracture of 4.5% hip Consider treatment given risk of hip fracture >3%. Previously encouraged to discuss w/ PCP at next appointment.   Supplements: Vitamin B12 1000 mcg gummy  Patient Goals/Self-Care Activities Over the next 90 days, patient will:  - take medications as prescribed focus on medication adherence by utilizing pill box check glucose daily, alternating between fasting and post prandial, document, and provide at future appointments check blood pressure daily, document, and provide at future appointments collaborate with provider on medication access solutions      Plan: Telephone follow up appointment with care management team member scheduled for:  2 months  [name], PharmD,  BCACP, CPP Contractor at [employer] [phone]

## 2021-02-19 NOTE — Patient Instructions (Signed)
Please drop off a copy of either 1) 2021 tax return or 2) 2022 or 2023 social security income statements.   Take care! [name], PharmD  Visit Information  Following are the goals we discussed today:  Patient Goals/Self-Care Activities Over the next 90 days, patient will:  - take medications as prescribed focus on medication adherence by utilizing pill box check glucose daily, alternating between fasting and post prandial, document, and provide at future appointments check blood pressure daily, document, and provide at future appointments collaborate with provider on medication access solutions        Plan: Telephone follow up appointment with care management team member scheduled for:  2 months   [name], PharmD, [place], CPP Clinical Pharmacist [place] at [facility] [phone]   Please call the care guide team at [phone] if you need to cancel or reschedule your appointment.   Print copy of patient instructions, educational materials, and care plan provided in person.

## 2021-02-23 ENCOUNTER — Telehealth: Payer: Self-pay | Admitting: Pharmacy Technician

## 2021-02-23 DIAGNOSIS — Z5986 Financial insecurity: Secondary | ICD-10-CM

## 2021-02-23 DIAGNOSIS — Z596 Low income: Secondary | ICD-10-CM

## 2021-02-23 NOTE — Progress Notes (Signed)
[facility])                                            [place] Team    02/23/2021  Ann Collins [birth year] [id number]  Received both patient and provider portion(s) of patient assistance application(s) for Ozempic. Faxed completed application and required documents into [employer].    Leonides Minder P. Daxtin Leiker, [place]  [phone]

## 2021-02-27 ENCOUNTER — Encounter: Payer: Self-pay | Admitting: Internal Medicine

## 2021-02-27 NOTE — Assessment & Plan Note (Signed)
No upper symptoms reported.  On protonix.   

## 2021-02-27 NOTE — Assessment & Plan Note (Signed)
Continue lisinopril.  Blood pressure doing well.  Follow pressures.  Follow metabolic panel.  

## 2021-02-27 NOTE — Assessment & Plan Note (Signed)
On thyroid replacement.  Follow tsh.  

## 2021-02-27 NOTE — Assessment & Plan Note (Signed)
Continue lipitor.  Follow lipid panel and liver function tests.   

## 2021-02-27 NOTE — Assessment & Plan Note (Signed)
Continue lipitor  ?

## 2021-02-27 NOTE — Assessment & Plan Note (Signed)
Lost another couple of pounds from last check.  Discussed.  Encourage increased po intake.  Follow.

## 2021-02-27 NOTE — Assessment & Plan Note (Signed)
She is on ozempic.  Discussed GI side effects.  Weight is down. She feels her appetite has improved.  Wants to continue on current medication regimen.  Follow weight.  Follow for GI side effects.  Follow met b and a1c.

## 2021-02-27 NOTE — Assessment & Plan Note (Signed)
Increased stress as outlined.  Discussed.  Does not feel needs any further intervention.  Follow.  

## 2021-02-27 NOTE — Assessment & Plan Note (Signed)
Follow cbc.  

## 2021-02-27 NOTE — Assessment & Plan Note (Signed)
Discussed the need to quit.  Desires not to quit at this time.  Screening chest CT

## 2021-02-27 NOTE — Assessment & Plan Note (Signed)
Found on screening CT (01/2021).  Recommended 3 month f/u or PET.

## 2021-03-09 DIAGNOSIS — E039 Hypothyroidism, unspecified: Secondary | ICD-10-CM

## 2021-03-09 DIAGNOSIS — E1165 Type 2 diabetes mellitus with hyperglycemia: Secondary | ICD-10-CM | POA: Diagnosis not present

## 2021-03-09 DIAGNOSIS — E7800 Pure hypercholesterolemia, unspecified: Secondary | ICD-10-CM

## 2021-03-09 DIAGNOSIS — I10 Essential (primary) hypertension: Secondary | ICD-10-CM | POA: Diagnosis not present

## 2021-03-11 ENCOUNTER — Telehealth: Payer: Self-pay | Admitting: Pharmacy Technician

## 2021-03-11 DIAGNOSIS — Z596 Low income: Secondary | ICD-10-CM

## 2021-03-11 NOTE — Progress Notes (Signed)
[facility])                                            [place] Team    03/11/2021  Ann Collins 12-Jun-1945 [id number]  Care coordination call placed to [place] in regard to [name] application.  Spoke to [name] who informs patient was APPROVED 03/09/21-02/06/22. Refills will be processed based on last fill date in 2022 and going forward with delivery to the provider's office.  [name] P. Keneth Borg, [place]  [phone]

## 2021-04-05 DIAGNOSIS — Z20822 Contact with and (suspected) exposure to covid-19: Secondary | ICD-10-CM | POA: Diagnosis not present

## 2021-04-09 ENCOUNTER — Telehealth: Payer: Self-pay

## 2021-04-09 NOTE — Chronic Care Management (AMB) (Signed)
?  Care Management  ? ?Note ? ?04/09/2021 ?Name: Ann Collins MRN: [id number] DOB: 1945/07/05 ? ?[name] is a 76 y.o. year old female who is a primary care patient of Einar Pheasant, MD and is actively engaged with the care management team. I reached out to [name] by phone today to assist with re-scheduling a follow up visit with the Pharmacist ? ?Follow up plan: ?Telephone appointment with care management team member scheduled for:07/19/2021 ? ? ?Noreene Larsson, RMA ?Care Guide, Embedded Care Coordination ?[place]  Care Management  ?[place], [place] [address] ?Direct Dial: [phone] ?[email or web address] ?[email or web address]: [email or web address]  ? ?

## 2021-04-09 NOTE — Chronic Care Management (AMB) (Signed)
?  Care Management  ? ?Note ? ?04/09/2021 ?Name: JAMEKA IVIE MRN: [id number] DOB: Dec 27, 1945 ? ?MIRNA SUTCLIFFE is a 76 y.o. year old female who is a primary care patient of Einar Pheasant, MD and is actively engaged with the care management team. I reached out to [name] by phone today to assist with re-scheduling a follow up visit with the Pharmacist ? ?Follow up plan: ?Unsuccessful telephone outreach attempt made. A HIPAA compliant phone message was left for the patient providing contact information and requesting a return call.  ?The care management team will reach out to the patient again over the next 7 days.  ?If patient returns call to provider office, please advise to call [place]  at [phone] ? ?Noreene Larsson, RMA ?Care Guide, Embedded Care Coordination ?[place]  Care Management  ?[place], [place] [address] ?Direct Dial: [phone] ?[email or web address] ?Website: [email or web address]  ? ?

## 2021-04-15 ENCOUNTER — Ambulatory Visit: Payer: Self-pay | Admitting: Pharmacist

## 2021-04-15 NOTE — Chronic Care Management (AMB) (Signed)
?  Chronic Care Management  ? ?Note ? ?04/15/2021 ?Name: Ann Collins MRN: [id number] DOB: 1945/03/11 ? ? ? ?Closing pharmacy CCM case at this time.  Patient has clinic contact information for future questions or concerns.  ? ?[name], PharmD, [place], CPP ?Clinical Pharmacist ?Therapist, music at [employer] ?[phone] ? ?

## 2021-04-15 NOTE — Patient Instructions (Signed)
Hi Alieah,  ? ?I am being asked to quickly transition into another role within the health system, so unfortunately I am unable to keep our next appointment. Please continue to follow up with your primary care provider as scheduled.  ? ?As a reminder-  ? ?The [employer] Patient Assistance Program for [employer] has an auto-refill program where you do not need to call and request a refill each time. You should receive your medication shipment before you run out of your medication. However, if you have any issues or need assistance, you can call them at [phone]. They are available Monday though Friday 8:00 AM - 8:00 PM.  ? ?The [employer] prescription savings program for [name] has an auto-refill program where you do not need to call and request a refill each time. You should receive your medication shipment before you run out of your medication. However, if you have any issues or need assistance, you can call them call [phone]. They are available Monday though Friday 9:00 AM - 6:00 PM.  ? ? ?It has been a pleasure working with you! ? ?[name], PharmD ? ?

## 2021-04-16 ENCOUNTER — Telehealth: Payer: Medicare Other

## 2021-05-05 ENCOUNTER — Other Ambulatory Visit: Payer: Self-pay

## 2021-05-05 ENCOUNTER — Telehealth: Payer: Self-pay

## 2021-05-05 ENCOUNTER — Other Ambulatory Visit (INDEPENDENT_AMBULATORY_CARE_PROVIDER_SITE_OTHER): Payer: Medicare Other

## 2021-05-05 DIAGNOSIS — E7800 Pure hypercholesterolemia, unspecified: Secondary | ICD-10-CM | POA: Diagnosis not present

## 2021-05-05 DIAGNOSIS — E1165 Type 2 diabetes mellitus with hyperglycemia: Secondary | ICD-10-CM

## 2021-05-05 DIAGNOSIS — I10 Essential (primary) hypertension: Secondary | ICD-10-CM | POA: Diagnosis not present

## 2021-05-05 DIAGNOSIS — D696 Thrombocytopenia, unspecified: Secondary | ICD-10-CM | POA: Diagnosis not present

## 2021-05-05 LAB — BASIC METABOLIC PANEL
BUN: 19 mg/dL (ref 6–23)
CO2: 29 mEq/L (ref 19–32)
Calcium: 9.8 mg/dL (ref 8.4–10.5)
Chloride: 105 mEq/L (ref 96–112)
Creatinine, Ser: 1.14 mg/dL (ref 0.40–1.20)
GFR: 47.11 mL/min — ABNORMAL LOW (ref >60.00)
Glucose, Bld: 138 mg/dL — ABNORMAL HIGH (ref 70–99)
Potassium: 4.8 mEq/L (ref 3.5–5.1)
Sodium: 140 mEq/L (ref 135–145)

## 2021-05-05 LAB — CBC WITH DIFFERENTIAL/PLATELET
Basophils Absolute: 0.1 10*3/uL (ref 0.0–0.1)
Basophils Relative: 1 % (ref 0.0–3.0)
Eosinophils Absolute: 0.2 10*3/uL (ref 0.0–0.7)
Eosinophils Relative: 3.4 % (ref 0.0–5.0)
HCT: 42.6 % (ref 36.0–46.0)
Hemoglobin: 13.6 g/dL (ref 12.0–15.0)
Lymphocytes Relative: 47.3 % — ABNORMAL HIGH (ref 12.0–46.0)
Lymphs Abs: 3.3 10*3/uL (ref 0.7–4.0)
MCHC: 32 g/dL (ref 30.0–36.0)
MCV: 89.9 fl (ref 78.0–100.0)
Monocytes Absolute: 0.4 10*3/uL (ref 0.1–1.0)
Monocytes Relative: 6 % (ref 3.0–12.0)
Neutro Abs: 3 10*3/uL (ref 1.4–7.7)
Neutrophils Relative %: 42.3 % — ABNORMAL LOW (ref 43.0–77.0)
Platelets: 125 10*3/uL — ABNORMAL LOW (ref 150.0–400.0)
RBC: 4.74 Mil/uL (ref 3.87–5.11)
RDW: 15.9 % — ABNORMAL HIGH (ref 11.5–15.5)
WBC: 7 10*3/uL (ref 4.0–10.5)

## 2021-05-05 LAB — HEPATIC FUNCTION PANEL
ALT: 18 U/L (ref 0–35)
AST: 16 U/L (ref 0–37)
Albumin: 4.1 g/dL (ref 3.5–5.2)
Alkaline Phosphatase: 82 U/L (ref 39–117)
Bilirubin, Direct: 0.1 mg/dL (ref 0.0–0.3)
Total Bilirubin: 0.5 mg/dL (ref 0.2–1.2)
Total Protein: 6.7 g/dL (ref 6.0–8.3)

## 2021-05-05 LAB — LIPID PANEL
Cholesterol: 124 mg/dL (ref 0–200)
HDL: 44.2 mg/dL (ref >39.00)
LDL Cholesterol: 56 mg/dL (ref 0–99)
NonHDL: 80.11
Total CHOL/HDL Ratio: 3
Triglycerides: 121 mg/dL (ref 0.0–149.0)
VLDL: 24.2 mg/dL (ref 0.0–40.0)

## 2021-05-05 LAB — HEMOGLOBIN A1C: Hgb A1c MFr Bld: 7.6 % — ABNORMAL HIGH (ref 4.6–6.5)

## 2021-05-05 NOTE — Telephone Encounter (Signed)
Patient picked up Ozempic medication in office from patient assistance ?

## 2021-05-06 ENCOUNTER — Other Ambulatory Visit: Payer: Self-pay | Admitting: *Deleted

## 2021-05-06 DIAGNOSIS — F17210 Nicotine dependence, cigarettes, uncomplicated: Secondary | ICD-10-CM

## 2021-05-06 DIAGNOSIS — Z87891 Personal history of nicotine dependence: Secondary | ICD-10-CM

## 2021-05-07 ENCOUNTER — Encounter: Payer: Self-pay | Admitting: Internal Medicine

## 2021-05-10 ENCOUNTER — Ambulatory Visit (INDEPENDENT_AMBULATORY_CARE_PROVIDER_SITE_OTHER): Payer: Medicare Other | Admitting: Internal Medicine

## 2021-05-10 VITALS — BP 126/70 | HR 73 | Temp 97.9°F | Resp 16 | Ht 62.0 in | Wt 126.2 lb

## 2021-05-10 DIAGNOSIS — Z87891 Personal history of nicotine dependence: Secondary | ICD-10-CM

## 2021-05-10 DIAGNOSIS — E7800 Pure hypercholesterolemia, unspecified: Secondary | ICD-10-CM | POA: Diagnosis not present

## 2021-05-10 DIAGNOSIS — D696 Thrombocytopenia, unspecified: Secondary | ICD-10-CM | POA: Diagnosis not present

## 2021-05-10 DIAGNOSIS — E039 Hypothyroidism, unspecified: Secondary | ICD-10-CM

## 2021-05-10 DIAGNOSIS — K2100 Gastro-esophageal reflux disease with esophagitis, without bleeding: Secondary | ICD-10-CM

## 2021-05-10 DIAGNOSIS — R0989 Other specified symptoms and signs involving the circulatory and respiratory systems: Secondary | ICD-10-CM | POA: Diagnosis not present

## 2021-05-10 DIAGNOSIS — R634 Abnormal weight loss: Secondary | ICD-10-CM | POA: Diagnosis not present

## 2021-05-10 DIAGNOSIS — I10 Essential (primary) hypertension: Secondary | ICD-10-CM

## 2021-05-10 DIAGNOSIS — I700 Atherosclerosis of aorta: Secondary | ICD-10-CM

## 2021-05-10 DIAGNOSIS — E1165 Type 2 diabetes mellitus with hyperglycemia: Secondary | ICD-10-CM | POA: Diagnosis not present

## 2021-05-10 DIAGNOSIS — F439 Reaction to severe stress, unspecified: Secondary | ICD-10-CM

## 2021-05-10 DIAGNOSIS — R911 Solitary pulmonary nodule: Secondary | ICD-10-CM

## 2021-05-10 DIAGNOSIS — Z1231 Encounter for screening mammogram for malignant neoplasm of breast: Secondary | ICD-10-CM | POA: Diagnosis not present

## 2021-05-10 NOTE — Progress Notes (Signed)
Patient ID: Ann Collins, female   DOB: 1945/11/29, 76 y.o.   MRN: [id number] ? ? ?Subjective:  ? ? Patient ID: Ann Collins, female    DOB: May 22, 1945, 76 y.o.   MRN: [id number] ? ?This visit occurred during the SARS-CoV-2 public health emergency.  Safety protocols were in place, including screening questions prior to the visit, additional usage of staff PPE, and extensive cleaning of exam room while observing appropriate contact time as indicated for disinfecting solutions.  ? ?Patient here for a scheduled follow up.  ? ? ?HPI ?Here to follow up regarding her blood pressure and blood sugars.  Also to follow up regarding weight loss.  She is eating better.  Feels better.  Weight is up.  Stays active.  No chest pain or sob reported.  No abdominal pain or bowel change reported.  Some numbness/tingling left foot. Just started after drive back from [place].  No leg or back pain or numbness/tingling.  Discussed further w/up.  Will notify me if desires further evaluation. Discussed labs.  A1c 7.6  discussed additional medication.  Declines. Wants to work on diet and exercise.  Increased stress.  Discussed.  Does not feel needs any further intervention.  ? ? ?Past Medical History:  ?Diagnosis Date  ? Abdominal hernia   ? CAD (coronary artery disease)   ? a. LHC 7/19: LM nl, p-mLAD 100% w/ right to left collaterals, OM2 90% (2 mm), 1st LPL 40%, medical management  ? Diabetes mellitus ([place])   ? Diverticulosis   ? GERD (gastroesophageal reflux disease)   ? Hematuria   ? Hypercholesterolemia   ? Hypertension   ? Hypothyroidism   ? Kidney stones   ? ?Past Surgical History:  ?Procedure Laterality Date  ? APPENDECTOMY    ? BREAST BIOPSY  1969  ? right  ? CHOLECYSTECTOMY  2005  ? COLONOSCOPY    ? COLONOSCOPY WITH PROPOFOL N/A 09/19/2017  ? Procedure: COLONOSCOPY WITH PROPOFOL;  Surgeon: Toledo, Benay Pike, MD;  Location: [facility] ENDOSCOPY;  Service: Gastroenterology;  Laterality: N/A;  ? ESOPHAGOGASTRODUODENOSCOPY (EGD) WITH  PROPOFOL N/A 09/19/2017  ? Procedure: ESOPHAGOGASTRODUODENOSCOPY (EGD) WITH PROPOFOL;  Surgeon: Toledo, Benay Pike, MD;  Location: [facility] ENDOSCOPY;  Service: Gastroenterology;  Laterality: N/A;  ? LEFT HEART CATH AND CORONARY ANGIOGRAPHY Left 08/07/2017  ? Procedure: LEFT HEART CATH AND CORONARY ANGIOGRAPHY;  Surgeon: Wellington Hampshire, MD;  Location: [facility];  Service: Cardiovascular;  Laterality: Left;  ? UPPER GI ENDOSCOPY    ? ?Family History  ?Problem Relation Age of Onset  ? Brain cancer Mother   ?     history of brain tumor  ? Lung disease Father   ? Diabetes Brother   ?     x2  ? Bipolar disorder Son   ? Alcohol abuse Son   ? Breast cancer Neg Hx   ? Colon cancer Neg Hx   ? ?Social History  ? ?Socioeconomic History  ? Marital status: Married  ?  Spouse name: Not on file  ? Number of children: Not on file  ? Years of education: Not on file  ? Highest education level: Not on file  ?Occupational History  ? Not on file  ?Tobacco Use  ? Smoking status: Every Day  ?  Packs/day: 1.00  ?  Years: 50.00  ?  Pack years: 50.00  ?  Types: Cigarettes  ? Smokeless tobacco: Never  ?Vaping Use  ? Vaping Use: Never used  ?Substance and Sexual Activity  ?  Alcohol use: No  ?  Alcohol/week: 0.0 standard drinks  ? Drug use: No  ? Sexual activity: Not on file  ?Other Topics Concern  ? Not on file  ?Social History Narrative  ? Not on file  ? ?Social Determinants of Health  ? ?Financial Resource Strain: Medium Risk  ? Difficulty of Paying Living Expenses: Somewhat hard  ?Food Insecurity: No Food Insecurity  ? Worried About Charity fundraiser in the Last Year: Never true  ? Ran Out of Food in the Last Year: Never true  ?Transportation Needs: No Transportation Needs  ? Lack of Transportation (Medical): No  ? Lack of Transportation (Non-Medical): No  ?Physical Activity: Insufficiently Active  ? Days of Exercise per Week: 3 days  ? Minutes of Exercise per Session: 30 min  ?Stress: Stress Concern Present  ? Feeling of Stress :  Rather much  ?Social Connections: Unknown  ? Frequency of Communication with Friends and Family: Not on file  ? Frequency of Social Gatherings with Friends and Family: Not on file  ? Attends Religious Services: Not on file  ? Active Member of Clubs or Organizations: Not on file  ? Attends Archivist Meetings: Not on file  ? Marital Status: Married  ? ? ? ?Review of Systems  ?Constitutional:  Negative for appetite change and unexpected weight change.  ?HENT:  Negative for congestion and sinus pressure.   ?Respiratory:  Negative for cough, chest tightness and shortness of breath.   ?Cardiovascular:  Negative for chest pain, palpitations and leg swelling.  ?Gastrointestinal:  Negative for abdominal pain, diarrhea, nausea and vomiting.  ?Genitourinary:  Negative for difficulty urinating and dysuria.  ?Musculoskeletal:  Negative for joint swelling and myalgias.  ?     Left foot numbness and tingling.   ?Skin:  Negative for color change and rash.  ?Neurological:  Negative for dizziness, light-headedness and headaches.  ?Psychiatric/Behavioral:  Negative for agitation and dysphoric mood.   ? ?   ?Objective:  ?  ? ?BP 126/70   Pulse 73   Temp 97.9 ?F (36.6 ?C)   Resp 16   Ht 5' 2"  (1.575 m)   Wt 126 lb 3.2 oz (57.2 kg)   LMP 03/10/1991   SpO2 98%   BMI 23.08 kg/m?  ?Wt Readings from Last 3 Encounters:  ?05/07/21 126 lb 3.2 oz (57.2 kg)  ?02/19/21 119 lb 12.8 oz (54.3 kg)  ?12/15/20 121 lb 9.6 oz (55.2 kg)  ? ? ?Physical Exam ?Vitals reviewed.  ?Constitutional:   ?   General: She is not in acute distress. ?   Appearance: Normal appearance.  ?HENT:  ?   Head: Normocephalic and atraumatic.  ?   Right Ear: External ear normal.  ?   Left Ear: External ear normal.  ?Eyes:  ?   General: No scleral icterus.    ?   Right eye: No discharge.     ?   Left eye: No discharge.  ?   Conjunctiva/sclera: Conjunctivae normal.  ?Neck:  ?   Thyroid: No thyromegaly.  ?Cardiovascular:  ?   Rate and Rhythm: Normal rate and  regular rhythm.  ?   Comments: Left carotid bruit.  ?Pulmonary:  ?   Effort: No respiratory distress.  ?   Breath sounds: Normal breath sounds. No wheezing.  ?Abdominal:  ?   General: Bowel sounds are normal.  ?   Palpations: Abdomen is soft.  ?   Tenderness: There is no abdominal tenderness.  ?Musculoskeletal:     ?  General: No swelling or tenderness.  ?   Cervical back: Neck supple. No tenderness.  ?Lymphadenopathy:  ?   Cervical: No cervical adenopathy.  ?Skin: ?   Findings: No erythema or rash.  ?Neurological:  ?   Mental Status: She is alert.  ?Psychiatric:     ?   Mood and Affect: Mood normal.     ?   Behavior: Behavior normal.  ? ? ? ?Outpatient Encounter Medications as of 05/10/2021  ?Medication Sig  ? aspirin 81 MG tablet Take 81 mg by mouth daily.  ? atorvastatin (LIPITOR) 20 MG tablet Take 1 tablet (20 mg total) by mouth daily.  ? blood glucose meter kit and supplies Dispense based on patient and insurance preference. Use up to four times daily as directed. (FOR ICD-10 E10.9, E11.9).  ? carvedilol (COREG) 3.125 MG tablet TAKE 1 TABLET(3.125 MG) BY MOUTH TWICE DAILY  ? dapagliflozin propanediol (FARXIGA) 10 MG TABS tablet Take 1 tablet (10 mg total) by mouth in the morning.  ? glucose blood test strip Use as instructed to blood sugar twice daily  ? levothyroxine (SYNTHROID) 112 MCG tablet TAKE 1 TABLET(112 MCG) BY MOUTH DAILY BEFORE  BREAKFAST  ? lisinopril (ZESTRIL) 5 MG tablet Take 1 tablet (5 mg total) by mouth daily.  ? pantoprazole (PROTONIX) 40 MG tablet Take 1 tablet (40 mg total) by mouth daily as needed.  ? Semaglutide (OZEMPIC, 0.25 OR 0.5 MG/DOSE, [place]) Inject 0.5 mg into the skin once a week.  ? triamcinolone (KENALOG) 0.1 % Apply 1 application topically 2 (two) times daily.  ? vitamin B-12 (CYANOCOBALAMIN) 1000 MCG tablet Take 1,000 mcg by mouth daily.  ? ?No facility-administered encounter medications on file as of 05/10/2021.  ?  ? ?Lab Results  ?Component Value Date  ? WBC 7.0 05/05/2021  ? HGB  13.6 05/05/2021  ? HCT 42.6 05/05/2021  ? PLT 125.0 (L) 05/05/2021  ? GLUCOSE 138 (H) 05/05/2021  ? CHOL 124 05/05/2021  ? TRIG 121.0 05/05/2021  ? HDL 44.20 05/05/2021  ? LDLDIRECT 71 10/18/2017  ? [place] 56 03/29/202

## 2021-05-11 ENCOUNTER — Other Ambulatory Visit: Payer: Self-pay

## 2021-05-11 ENCOUNTER — Ambulatory Visit
Admission: RE | Admit: 2021-05-11 | Discharge: 2021-05-11 | Disposition: A | Discharge status: Home / Self Care | Payer: Medicare Other | Source: Ambulatory Visit | Attending: Acute Care | Admitting: Acute Care

## 2021-05-11 DIAGNOSIS — F17210 Nicotine dependence, cigarettes, uncomplicated: Secondary | ICD-10-CM | POA: Insufficient documentation

## 2021-05-11 DIAGNOSIS — Z87891 Personal history of nicotine dependence: Secondary | ICD-10-CM | POA: Insufficient documentation

## 2021-05-13 ENCOUNTER — Other Ambulatory Visit: Payer: Self-pay

## 2021-05-13 ENCOUNTER — Telehealth: Payer: Self-pay | Admitting: Acute Care

## 2021-05-13 DIAGNOSIS — Z20822 Contact with and (suspected) exposure to covid-19: Secondary | ICD-10-CM | POA: Diagnosis not present

## 2021-05-13 DIAGNOSIS — F17210 Nicotine dependence, cigarettes, uncomplicated: Secondary | ICD-10-CM

## 2021-05-13 DIAGNOSIS — Z87891 Personal history of nicotine dependence: Secondary | ICD-10-CM

## 2021-05-13 DIAGNOSIS — Z122 Encounter for screening for malignant neoplasm of respiratory organs: Secondary | ICD-10-CM

## 2021-05-13 NOTE — Telephone Encounter (Signed)
Spoke with patient by phone to review results of follow up CT chest.  Patient has reviewed results in mychart.  Will place patient back on annual scanning, as review shows no concerns for lung cancer on follow up scan.  Also noted the coronary artery atherosclerosis.  Patient is followed by cardiology and results routed to PCP and cardiologist per patient request.  New order placed for yearly LDCT.  Patient acknowledged understanding and had no further questions. ?

## 2021-05-15 ENCOUNTER — Encounter: Payer: Self-pay | Admitting: Internal Medicine

## 2021-05-15 NOTE — Assessment & Plan Note (Signed)
Continue lipitor.  Follow lipid panel and liver function tests.   

## 2021-05-15 NOTE — Assessment & Plan Note (Signed)
Continue lisinopril.  Blood pressure doing well.  Follow pressures.  Follow metabolic panel.  

## 2021-05-15 NOTE — Assessment & Plan Note (Signed)
No upper symptoms reported.  On protonix.   

## 2021-05-15 NOTE — Assessment & Plan Note (Signed)
Continue lipitor  ?

## 2021-05-15 NOTE — Assessment & Plan Note (Signed)
On thyroid replacement.  Follow tsh.  

## 2021-05-15 NOTE — Assessment & Plan Note (Signed)
Follow cbc.  

## 2021-05-15 NOTE — Assessment & Plan Note (Signed)
Schedule carotid ultrasound.  

## 2021-05-15 NOTE — Assessment & Plan Note (Signed)
Discussed the need to quit.  Desires not to quit at this time.  Screening chest CT ?

## 2021-05-15 NOTE — Assessment & Plan Note (Signed)
Eating better.  Appetite better.  Weight is up a few pounds.   ?

## 2021-05-15 NOTE — Assessment & Plan Note (Signed)
She is on ozempic.  She feels her appetite has improved.  Eating better.  Weight is up. Wants to continue on current medication regimen.  Discussed treatment options.  She desires not to change.  Follow met b and a1c.  ?

## 2021-05-15 NOTE — Assessment & Plan Note (Signed)
Found on screening CT (01/2021).  Recommended 3 month f/u or PET. CT scheduled for tomorrow.  ?

## 2021-05-15 NOTE — Assessment & Plan Note (Signed)
Increased stress as outlined.  Discussed.  Does not feel needs any further intervention.  Follow.  

## 2021-05-24 DIAGNOSIS — Z20822 Contact with and (suspected) exposure to covid-19: Secondary | ICD-10-CM | POA: Diagnosis not present

## 2021-06-02 ENCOUNTER — Ambulatory Visit (INDEPENDENT_AMBULATORY_CARE_PROVIDER_SITE_OTHER): Payer: Medicare Other

## 2021-06-02 DIAGNOSIS — R0989 Other specified symptoms and signs involving the circulatory and respiratory systems: Secondary | ICD-10-CM | POA: Diagnosis not present

## 2021-06-03 ENCOUNTER — Encounter: Payer: Self-pay | Admitting: Internal Medicine

## 2021-06-03 ENCOUNTER — Ambulatory Visit (INDEPENDENT_AMBULATORY_CARE_PROVIDER_SITE_OTHER): Payer: Medicare Other

## 2021-06-03 VITALS — Ht 62.0 in | Wt 126.0 lb

## 2021-06-03 DIAGNOSIS — Z0000 Encounter for general adult medical examination without abnormal findings: Secondary | ICD-10-CM

## 2021-06-03 DIAGNOSIS — I779 Disorder of arteries and arterioles, unspecified: Secondary | ICD-10-CM | POA: Insufficient documentation

## 2021-06-03 NOTE — Progress Notes (Addendum)
Subjective:   Ann Collins is a 76 y.o. female who presents for Medicare Annual (Subsequent) preventive examination.  Review of Systems    No ROS.  Medicare Wellness Virtual Visit.  Visual/audio telehealth visit, UTA vital signs.   See social history for additional risk factors.   Cardiac Risk Factors include: advanced age (>10men, >36 women);diabetes mellitus;hypertension     Objective:    Today's Vitals   06/03/21 1552  Weight: 126 lb (57.2 kg)  Height: 5\' 2"  (1.575 m)   Body mass index is 23.05 kg/m.     06/03/2021    4:11 PM 06/02/2020   12:41 PM 05/31/2019   11:47 AM 05/30/2018   11:26 AM 09/19/2017   12:12 PM 08/07/2017    8:42 AM 05/12/2017    8:38 AM  Advanced Directives  Does Patient Have a Medical Advance Directive? No No No Yes No No Yes  Type of Theme park manager;Living will   Healthcare Power of [place];Living will  Does patient want to make changes to medical advance directive?    No - Patient declined   No - Patient declined  Copy of Healthcare Power of Attorney in Chart?    No - copy requested   No - copy requested  Would patient like information on creating a medical advance directive? No - Patient declined No - Patient declined Yes (MAU/Ambulatory/Procedural Areas - Information given)   No - Patient declined     Current Medications (verified) Outpatient Encounter Medications as of 06/03/2021  Medication Sig   aspirin 81 MG tablet Take 81 mg by mouth daily.   atorvastatin (LIPITOR) 20 MG tablet Take 1 tablet (20 mg total) by mouth daily.   blood glucose meter kit and supplies Dispense based on patient and insurance preference. Use up to four times daily as directed. (FOR ICD-10 E10.9, E11.9).   carvedilol (COREG) 3.125 MG tablet TAKE 1 TABLET(3.125 MG) BY MOUTH TWICE DAILY   dapagliflozin propanediol (FARXIGA) 10 MG TABS tablet Take 1 tablet (10 mg total) by mouth in the morning.   glucose blood test strip Use as instructed  to blood sugar twice daily   levothyroxine (SYNTHROID) 112 MCG tablet TAKE 1 TABLET(112 MCG) BY MOUTH DAILY BEFORE  BREAKFAST   lisinopril (ZESTRIL) 5 MG tablet Take 1 tablet (5 mg total) by mouth daily.   pantoprazole (PROTONIX) 40 MG tablet Take 1 tablet (40 mg total) by mouth daily as needed.   Semaglutide (OZEMPIC, 0.25 OR 0.5 MG/DOSE, ) Inject 0.5 mg into the skin once a week.   triamcinolone (KENALOG) 0.1 % Apply 1 application topically 2 (two) times daily.   vitamin B-12 (CYANOCOBALAMIN) 1000 MCG tablet Take 1,000 mcg by mouth daily.   No facility-administered encounter medications on file as of 06/03/2021.    Allergies (verified) Pioglitazone, Macrobid [nitrofurantoin macrocrystal], and Nitrofurantoin   History: Past Medical History:  Diagnosis Date   Abdominal hernia    CAD (coronary artery disease)    a. LHC 7/19: LM nl, p-mLAD 100% w/ right to left collaterals, OM2 90% (2 mm), 1st LPL 40%, medical management   Diabetes mellitus (HCC)    Diverticulosis    GERD (gastroesophageal reflux disease)    Hematuria    Hypercholesterolemia    Hypertension    Hypothyroidism    Kidney stones    Past Surgical History:  Procedure Laterality Date   APPENDECTOMY     BREAST BIOPSY  1969   right  CHOLECYSTECTOMY  2005   COLONOSCOPY     COLONOSCOPY WITH PROPOFOL N/A 09/19/2017   Procedure: COLONOSCOPY WITH PROPOFOL;  Surgeon: Toledo, Boykin Nearing, MD;  Location: [facility] ENDOSCOPY;  Service: Gastroenterology;  Laterality: N/A;   ESOPHAGOGASTRODUODENOSCOPY (EGD) WITH PROPOFOL N/A 09/19/2017   Procedure: ESOPHAGOGASTRODUODENOSCOPY (EGD) WITH PROPOFOL;  Surgeon: Toledo, Boykin Nearing, MD;  Location: [facility] ENDOSCOPY;  Service: Gastroenterology;  Laterality: N/A;   LEFT HEART CATH AND CORONARY ANGIOGRAPHY Left 08/07/2017   Procedure: LEFT HEART CATH AND CORONARY ANGIOGRAPHY;  Surgeon: Iran Ouch, MD;  Location: [facility] INVASIVE CV LAB;  Service: Cardiovascular;  Laterality: Left;   UPPER GI  ENDOSCOPY     Family History  Problem Relation Age of Onset   Brain cancer Mother        history of brain tumor   Lung disease Father    Diabetes Brother        x2   Bipolar disorder Son    Alcohol abuse Son    Breast cancer Neg Hx    Colon cancer Neg Hx    Social History   Socioeconomic History   Marital status: Married    Spouse name: Not on file   Number of children: Not on file   Years of education: Not on file   Highest education level: Not on file  Occupational History   Not on file  Tobacco Use   Smoking status: Every Day    Packs/day: 1.00    Years: 50.00    Pack years: 50.00    Types: Cigarettes   Smokeless tobacco: Never  Vaping Use   Vaping Use: Never used  Substance and Sexual Activity   Alcohol use: No    Alcohol/week: 0.0 standard drinks   Drug use: No   Sexual activity: Not on file  Other Topics Concern   Not on file  Social History Narrative   Not on file   Social Determinants of Health   Financial Resource Strain: Medium Risk   Difficulty of Paying Living Expenses: Somewhat hard  Food Insecurity: No Food Insecurity   Worried About Programme researcher, broadcasting/film/video in the Last Year: Never true   Ran Out of Food in the Last Year: Never true  Transportation Needs: No Transportation Needs   Lack of Transportation (Medical): No   Lack of Transportation (Non-Medical): No  Physical Activity: Sufficiently Active   Days of Exercise per Week: 3 days   Minutes of Exercise per Session: 60 min  Stress: Stress Concern Present   Feeling of Stress : Rather much  Social Connections: Unknown   Frequency of Communication with Friends and Family: Not on file   Frequency of Social Gatherings with Friends and Family: Not on file   Attends Religious Services: Not on file   Active Member of Clubs or Organizations: Not on file   Attends Banker Meetings: Not on file   Marital Status: Married    Tobacco Counseling Ready to quit: Not Answered Counseling  given: Not Answered   Clinical Intake:  Pre-visit preparation completed: Yes        Diabetes:  (Followed by PCP. Diet/exercise controlled. FBS 124 today.)  How often do you need to have someone help you when you read instructions, pamphlets, or other written materials from your doctor or pharmacy?: 1 - Never   Interpreter Needed?: No      Activities of Daily Living    06/03/2021    4:13 PM  In your present state of health, do  you have any difficulty performing the following activities:  Hearing? 0  Vision? 0  Difficulty concentrating or making decisions? 0  Walking or climbing stairs? 0  Dressing or bathing? 0  Doing errands, shopping? 0  Preparing Food and eating ? N  Using the Toilet? N  In the past six months, have you accidently leaked urine? N  Do you have problems with loss of bowel control? N  Managing your Medications? N  Managing your Finances? N  Housekeeping or managing your Housekeeping? N    Patient Care Team: Dale Hauser, MD as PCP - General (Internal Medicine) Iran Ouch, MD as PCP - Cardiology (Cardiology)  Indicate any recent Medical Services you may have received from other than [facility] providers in the past year (date may be approximate).     Assessment:   This is a routine wellness examination for Tahira.  Virtual Visit via Telephone Note  I connected with  Ann Collins on 06/03/21 at  2:45 PM EDT by telephone and verified that I am speaking with the correct person using two identifiers.  Persons participating in the virtual visit: patient/Nurse Health Advisor   I discussed the limitations of performing an evaluation and management service by telehealth. The patient expressed understanding and agreed to proceed. We continued and completed visit with audio only. Some vital signs may be absent or patient reported.   Hearing/Vision screen Hearing Screening - Comments:: Patient is able to hear conversational tones without difficulty.   No issues reported. Vision Screening - Comments:: Followed by Dr. Larence Penning  Cataract extraction, bilateral  They have regular follow up with the ophthalmologist  Dietary issues and exercise activities discussed: Current Exercise Habits: Home exercise routine, Type of exercise: strength training/weights;treadmill, Time (Minutes): 60, Frequency (Times/Week): 3, Weekly Exercise (Minutes/Week): 180, Intensity: Moderate Low carb diet Good water intake   Goals Addressed               This Visit's Progress     Patient Stated     Maintain Healthy Lifestyle (pt-stated)   On track     Stay active Stay hydrated  Healthy diet       Depression Screen    06/03/2021    4:08 PM 06/02/2020   12:52 PM 06/02/2020    8:02 AM 05/31/2019   11:47 AM 05/30/2018   11:27 AM 05/12/2017    8:41 AM 12/09/2016   12:03 PM  PHQ 2/9 Scores  PHQ - 2 Score 0 0 0 1 0 0 0    Fall Risk    06/03/2021    4:13 PM 12/15/2020    7:41 AM 06/02/2020   12:52 PM 06/02/2020    8:02 AM 05/31/2019   11:47 AM  Fall Risk   Falls in the past year? 0 0 0 0 0  Number falls in past yr: 0 0 0    Injury with Fall?  0 0    Risk for fall due to :  No Fall Risks     Follow up Falls evaluation completed Falls evaluation completed Falls evaluation completed  Falls evaluation completed    FALL RISK PREVENTION PERTAINING TO THE HOME: Home free of loose throw rugs in walkways, pet beds, electrical cords, etc? Yes  Adequate lighting in your home to reduce risk of falls? Yes   ASSISTIVE DEVICES UTILIZED TO PREVENT FALLS: Life alert? No  Use of a cane, walker or w/c? No   TIMED UP AND GO: Was the test performed? No .  Cognitive Function: Patient is alert and oriented x3.  Enjoys brain games and other brain health stimulating activities.         05/31/2019   11:54 AM 05/30/2018   11:28 AM 05/12/2017    8:41 AM  6CIT Screen  What Year? 0 points 0 points 0 points  What month? 0 points 0 points 0 points  What time? 0 points 0  points 0 points  Count back from 20  0 points 0 points  Months in reverse 0 points 0 points 0 points  Repeat phrase   0 points  Total Score   0 points    Immunizations Immunization History  Administered Date(s) Administered   Fluad Quad(high Dose 65+) 11/11/2019   Influenza Split 10/16/2013   Influenza Whole 11/24/2016   Influenza, High Dose Seasonal PF 11/05/2017, 10/18/2018, 11/21/2020   Influenza,inj,quad, With Preservative 11/07/2017   Influenza-Unspecified 11/07/2012, 10/27/2014   Moderna SARS-COV2 Booster Vaccination 11/21/2020   Moderna Sars-Covid-2 Vaccination 03/09/2019, 04/07/2019, 01/30/2020, 07/29/2020   Pneumococcal Conjugate-13 03/02/2016   Pneumococcal Polysaccharide-23 12/08/2012, 03/02/2017   Tdap 10/14/2013   Screening Tests Health Maintenance  Topic Date Due   COVID-19 Vaccine (5 - Booster for Moderna series) 06/19/2021 (Originally 01/16/2021)   Zoster Vaccines- Shingrix (1 of 2) 08/09/2021 (Originally 11/17/1995)   MAMMOGRAM  06/15/2021   OPHTHALMOLOGY EXAM  06/22/2021   INFLUENZA VACCINE  09/07/2021   FOOT EXAM  09/29/2021   HEMOGLOBIN A1C  11/05/2021   TETANUS/TDAP  10/15/2023   COLONOSCOPY (Pts 45-52yrs Insurance coverage will need to be confirmed)  09/20/2027   Pneumonia Vaccine 77+ Years old  Completed   DEXA SCAN  Completed   Hepatitis C Screening  Completed   HPV VACCINES  Aged Out   Fecal DNA (Cologuard)  Discontinued   Health Maintenance There are no preventive care reminders to display for this patient.  Mammogram- due 06/15/21. Number provided to schedule. Order placed 05/10/21.   Lung Cancer Screening: ordered 05/13/21.   Vision Screening: Recommended annual ophthalmology exams for early detection of glaucoma and other disorders of the eye.  Dental Screening: Recommended annual dental exams for proper oral hygiene  Community Resource Referral / Chronic Care Management: CRR required this visit?  No   CCM required this visit?  No       Plan:   Keep all routine maintenance appointments.   I have personally reviewed and noted the following in the patient's chart:   Medical and social history Use of alcohol, tobacco or illicit drugs  Current medications and supplements including opioid prescriptions.  Functional ability and status Nutritional status Physical activity Advanced directives List of other physicians Hospitalizations, surgeries, and ER visits in previous 12 months Vitals Screenings to include cognitive, depression, and falls Referrals and appointments  In addition, I have reviewed and discussed with patient certain preventive protocols, quality metrics, and best practice recommendations. A written personalized care plan for preventive services as well as general preventive health recommendations were provided to patient.     Ashok Pall, LPN   1/61/0960

## 2021-06-03 NOTE — Patient Instructions (Addendum)
?  Ann Collins , ?Thank you for taking time to come for your Medicare Wellness Visit. I appreciate your ongoing commitment to your health goals. Please review the following plan we discussed and let me know if I can assist you in the future.  ? ?These are the goals we discussed: ? Goals   ? ?  ? Patient Stated  ?   Maintain Healthy Lifestyle (pt-stated)   ?   Stay active ?Stay hydrated  ?Healthy diet ?  ? ?  ?  ?This is a list of the screening recommended for you and due dates:  ?Health Maintenance  ?Topic Date Due  ? COVID-19 Vaccine (5 - Booster for Moderna series) 06/19/2021*  ? Zoster (Shingles) Vaccine (1 of 2) 08/09/2021*  ? Mammogram  06/15/2021  ? Eye exam for diabetics  06/22/2021  ? Flu Shot  09/07/2021  ? Complete foot exam   09/29/2021  ? Hemoglobin A1C  11/05/2021  ? Tetanus Vaccine  10/15/2023  ? Colon Cancer Screening  09/20/2027  ? Pneumonia Vaccine  Completed  ? DEXA scan (bone density measurement)  Completed  ? Hepatitis C Screening: USPSTF Recommendation to screen - Ages 46-79 yo.  Completed  ? HPV Vaccine  Aged Out  ? Cologuard (Stool DNA test)  Discontinued  ?*Topic was postponed. The date shown is not the original due date.  ?  ?

## 2021-06-12 DIAGNOSIS — Z20822 Contact with and (suspected) exposure to covid-19: Secondary | ICD-10-CM | POA: Diagnosis not present

## 2021-06-14 DIAGNOSIS — Z20822 Contact with and (suspected) exposure to covid-19: Secondary | ICD-10-CM | POA: Diagnosis not present

## 2021-07-06 ENCOUNTER — Ambulatory Visit
Admission: RE | Admit: 2021-07-06 | Discharge: 2021-07-06 | Disposition: A | Discharge status: Home / Self Care | Payer: Medicare Other | Source: Ambulatory Visit | Attending: Internal Medicine | Admitting: Internal Medicine

## 2021-07-06 DIAGNOSIS — Z1231 Encounter for screening mammogram for malignant neoplasm of breast: Secondary | ICD-10-CM | POA: Diagnosis not present

## 2021-07-19 ENCOUNTER — Telehealth: Payer: Medicare Other

## 2021-07-20 ENCOUNTER — Other Ambulatory Visit: Payer: Self-pay | Admitting: Internal Medicine

## 2021-07-22 ENCOUNTER — Ambulatory Visit (INDEPENDENT_AMBULATORY_CARE_PROVIDER_SITE_OTHER): Payer: Medicare Other | Admitting: Cardiovascular Disease

## 2021-07-22 ENCOUNTER — Encounter: Payer: Self-pay | Admitting: Cardiovascular Disease

## 2021-07-22 VITALS — BP 128/70 | HR 62 | Ht 62.0 in | Wt 125.5 lb

## 2021-07-22 DIAGNOSIS — Z720 Tobacco use: Secondary | ICD-10-CM | POA: Diagnosis not present

## 2021-07-22 DIAGNOSIS — E785 Hyperlipidemia, unspecified: Secondary | ICD-10-CM

## 2021-07-22 DIAGNOSIS — I10 Essential (primary) hypertension: Secondary | ICD-10-CM

## 2021-07-22 DIAGNOSIS — I2510 Atherosclerotic heart disease of native coronary artery without angina pectoris: Secondary | ICD-10-CM

## 2021-07-22 MED ORDER — CARVEDILOL 3.125 MG PO TABS: ORAL_TABLET | ORAL | 2 refills | Status: DC

## 2021-07-22 MED ORDER — ATORVASTATIN CALCIUM 20 MG PO TABS: ORAL_TABLET | ORAL | 2 refills | Status: DC

## 2021-07-22 NOTE — Patient Instructions (Signed)
Medication Instructions:  Your physician recommends that you continue on your current medications as directed. Please refer to the Current Medication list given to you today.  *If you need a refill on your cardiac medications before your next appointment, please call your pharmacy*   Lab Work: None ordered If you have labs (blood work) drawn today and your tests are completely normal, you will receive your results only by: [place] (if you have MyChart) OR A paper copy in the mail If you have any lab test that is abnormal or we need to change your treatment, we will call you to review the results.   Testing/Procedures: None ordered   Follow-Up: At [facility], you and your health needs are our priority.  As part of our continuing mission to provide you with exceptional heart care, we have created designated Provider Care Teams.  These Care Teams include your primary Cardiologist (physician) and Advanced Practice Providers (APPs -  Physician Assistants and Nurse Practitioners) who all work together to provide you with the care you need, when you need it.  We recommend signing up for the patient portal called "MyChart".  Sign up information is provided on this After Visit Summary.  MyChart is used to connect with patients for Virtual Visits (Telemedicine).  Patients are able to view lab/test results, encounter notes, upcoming appointments, etc.  Non-urgent messages can be sent to your provider as well.   To learn more about what you can do with MyChart, go to [email or web address].    Your next appointment:   Your physician wants you to follow-up in: 1 year You will receive a reminder letter in the mail two months in advance. If you don't receive a letter, please call our office to schedule the follow-up appointment.   The format for your next appointment:   In Person  Provider:   You may see Kathlyn [place], MD or one of the following Advanced Practice Providers on your  designated Care Team:   Murray Hodgkins, NP Christell Faith, PA-C Cadence Kathlen Mody, [place]   Other Instructions   Steps to Quit Smoking Smoking tobacco is the leading cause of preventable death. It can affect almost every organ in the body. Smoking puts you and people around you at risk for many serious, long-lasting (chronic) diseases. Quitting smoking can be hard, but it is one of the best things that you can do for your health. It is never too late to quit. Do not give up if you cannot quit the first time. Some people need to try many times to quit. Do your best to stick to your quit plan, and talk with your doctor if you have any questions or concerns. How do I get ready to quit? Pick a date to quit. Set a date within the next 2 weeks to give you time to prepare. Write down the reasons why you are quitting. Keep this list in places where you will see it often. Tell your family, friends, and co-workers that you are quitting. Their support is important. Talk with your doctor about the choices that may help you quit. Find out if your health insurance will pay for these treatments. Know the people, places, things, and activities that make you want to smoke (triggers). Avoid them. What first steps can I take to quit smoking? Throw away all cigarettes at home, at work, and in your car. Throw away the things that you use when you smoke, such as ashtrays and lighters. Clean your car. Empty the  ashtray. Clean your home, including curtains and carpets. What can I do to help me quit smoking? Talk with your doctor about taking medicines and seeing a counselor. You are more likely to succeed when you do both. If you are pregnant or breastfeeding: Talk with your doctor about counseling or other ways to quit smoking. Do not take medicine to help you quit smoking unless your doctor tells you to. Quit right away Quit smoking completely, instead of slowly cutting back on how much you smoke over a period of  time. Stopping smoking right away may be more successful than slowly quitting. Go to counseling. In-person is best if this is an option. You are more likely to quit if you go to counseling sessions regularly. Take medicine You may take medicines to help you quit. Some medicines need a prescription, and some you can buy over-the-counter. Some medicines may contain a drug called nicotine to replace the nicotine in cigarettes. Medicines may: Help you stop having the desire to smoke (cravings). Help to stop the problems that come when you stop smoking (withdrawal symptoms). Your doctor may ask you to use: Nicotine patches, gum, or lozenges. Nicotine inhalers or sprays. Non-nicotine medicine that you take by mouth. Find resources Find resources and other ways to help you quit smoking and remain smoke-free after you quit. They include: Online chats with a Social worker. Phone quitlines. Printed Furniture conservator/restorer. Support groups or group counseling. Text messaging programs. Mobile phone apps. Use apps on your mobile phone or tablet that can help you stick to your quit plan. Examples of free services include Quit Guide from the CDC and smokefree.gov  What can I do to make it easier to quit?  Talk to your family and friends. Ask them to support and encourage you. Call a phone quitline, such as 1-800-QUIT-NOW, reach out to support groups, or work with a Social worker. Ask people who smoke to not smoke around you. Avoid places that make you want to smoke, such as: Bars. Parties. Smoke-break areas at work. Spend time with people who do not smoke. Lower the stress in your life. Stress can make you want to smoke. Try these things to lower stress: Getting regular exercise. Doing deep-breathing exercises. Doing yoga. Meditating. What benefits will I see if I quit smoking? Over time, you may have: A better sense of smell and taste. Less coughing and sore throat. A slower heart rate. Lower blood  pressure. Clearer skin. Better breathing. Fewer sick days. Summary Quitting smoking can be hard, but it is one of the best things that you can do for your health. Do not give up if you cannot quit the first time. Some people need to try many times to quit. When you decide to quit smoking, make a plan to help you succeed. Quit smoking right away, not slowly over a period of time. When you start quitting, get help and support to keep you smoke-free. This information is not intended to replace advice given to you by your health care provider. Make sure you discuss any questions you have with your health care provider. Document Revised: 01/15/2021 Document Reviewed: 01/15/2021 Elsevier Patient Education  [place]

## 2021-07-22 NOTE — Progress Notes (Signed)
Cardiology Office Note   Date:  07/22/2021   ID:  Ann Collins, DOB 1945-12-14, MRN [id number]  PCP:  Einar Pheasant, MD  Cardiologist:   Kathlyn Sacramento, MD   Chief Complaint  Patient presents with   Other    12 Month f/u no complaints today. Meds reviewed verbally with pt.       History of Present Illness: Ann Collins is a 76 y.o. female who is here today for follow-up visit regarding coronary artery disease. She has chronic medical conditions that include diabetes mellitus, hyperlipidemia,  tobacco use and hypertension. She has known history of esophageal erosions and dysphagia.   She was evaluated in June 2019 for chest pain and abnormal stress test which showed evidence of LAD ischemia with low normal ejection fraction. Cardiac catheterization in July 2019 showed left dominant system with severe two-vessel coronary artery disease.  The LAD was chronically occluded proximally with well-developed collaterals from the right coronary artery.  There was also 90% stenosis in OM 2 which was a 2 mm vessel.  Left ventricular angiography showed low normal LV systolic function with an EF of 50 to 55% with anterior and apical hypokinesis.    She was treated medically with no revascularization.  She has been doing reasonably well with no chest pain or shortness of breath.  She takes her medications regularly.  Unfortunately, she continues to smoke.  No upper and lower extremity claudication.    Past Medical History:  Diagnosis Date   Abdominal hernia    CAD (coronary artery disease)    a. LHC 7/19: LM nl, p-mLAD 100% w/ right to left collaterals, OM2 90% (2 mm), 1st LPL 40%, medical management   Diabetes mellitus ([place])    Diverticulosis    GERD (gastroesophageal reflux disease)    Hematuria    Hypercholesterolemia    Hypertension    Hypothyroidism    Kidney stones     Past Surgical History:  Procedure Laterality Date   APPENDECTOMY     BREAST BIOPSY  1969   right    CHOLECYSTECTOMY  2005   COLONOSCOPY     COLONOSCOPY WITH PROPOFOL N/A 09/19/2017   Procedure: COLONOSCOPY WITH PROPOFOL;  Surgeon: Toledo, Benay Pike, MD;  Location: [facility];  Service: Gastroenterology;  Laterality: N/A;   ESOPHAGOGASTRODUODENOSCOPY (EGD) WITH PROPOFOL N/A 09/19/2017   Procedure: ESOPHAGOGASTRODUODENOSCOPY (EGD) WITH PROPOFOL;  Surgeon: Toledo, Benay Pike, MD;  Location: [facility];  Service: Gastroenterology;  Laterality: N/A;   LEFT HEART CATH AND CORONARY ANGIOGRAPHY Left 08/07/2017   Procedure: LEFT HEART CATH AND CORONARY ANGIOGRAPHY;  Surgeon: Wellington Hampshire, MD;  Location: [facility];  Service: Cardiovascular;  Laterality: Left;   UPPER GI ENDOSCOPY       Current Outpatient Medications  Medication Sig Dispense Refill   aspirin 81 MG tablet Take 81 mg by mouth daily.     blood glucose meter kit and supplies Dispense based on patient and insurance preference. Use up to four times daily as directed. (FOR ICD-10 E10.9, E11.9). 1 each 3   dapagliflozin propanediol (FARXIGA) 10 MG TABS tablet Take 1 tablet (10 mg total) by mouth in the morning. 90 tablet 3   glucose blood test strip Use as instructed to blood sugar twice daily 100 each 12   levothyroxine (SYNTHROID) 112 MCG tablet TAKE 1 TABLET(112 MCG) BY MOUTH DAILY BEFORE  BREAKFAST 90 tablet 1   lisinopril (ZESTRIL) 5 MG tablet Take 1 tablet (5 mg total) by  mouth daily. 90 tablet 1   pantoprazole (PROTONIX) 40 MG tablet Take 1 tablet (40 mg total) by mouth daily as needed. 90 tablet 1   Semaglutide (OZEMPIC, 0.25 OR 0.5 MG/DOSE, [place]) Inject 0.5 mg into the skin once a week.     triamcinolone (KENALOG) 0.1 % Apply 1 application topically 2 (two) times daily. 30 g 0   vitamin B-12 (CYANOCOBALAMIN) 1000 MCG tablet Take 1,000 mcg by mouth daily.     atorvastatin (LIPITOR) 20 MG tablet TAKE 1 TABLET(20 MG) BY MOUTH DAILY 90 tablet 2   carvedilol (COREG) 3.125 MG tablet TAKE 1 TABLET(3.125 MG) BY MOUTH  TWICE DAILY 180 tablet 2   No current facility-administered medications for this visit.    Allergies:   Pioglitazone, Macrobid [nitrofurantoin macrocrystal], and Nitrofurantoin    Social History:  The patient  reports that she has been smoking cigarettes. She has a 50.00 pack-year smoking history. She has never used smokeless tobacco. She reports that she does not drink alcohol and does not use drugs.   Family History:  The patient's family history includes Alcohol abuse in her son; Bipolar disorder in her son; Brain cancer in her mother; Diabetes in her brother; Lung disease in her father.    ROS:  Please see the history of present illness.   Otherwise, review of systems are positive for none.   All other systems are reviewed and negative.    PHYSICAL EXAM: VS:  BP 128/70 (BP Location: Left Arm, Patient Position: Sitting, Cuff Size: Normal)   Pulse 62   Ht 5' 2" (1.575 m)   Wt 125 lb 8 oz (56.9 kg)   LMP 03/10/1991   SpO2 98%   BMI 22.95 kg/m  , BMI Body mass index is 22.95 kg/m. GEN: Well nourished, well developed, in no acute distress  HEENT: normal  Neck: no JVD, carotid bruits, or masses Cardiac: RRR; no murmurs, rubs, or gallops,no edema  Respiratory:  clear to auscultation bilaterally, normal work of breathing GI: soft, nontender, nondistended, + BS MS: no deformity or atrophy  Skin: warm and dry, no rash Neuro:  Strength and sensation are intact Psych: euthymic mood, full affect Distal pulses are palpable. Radial and brachial pulses: Very diminished on the right side and normal on the left.   EKG:  EKG is ordered today. The ekg ordered today demonstrates normal sinus rhythm with no significant ST or T wave changes.   Recent Labs: 12/14/2020: TSH 2.45 05/05/2021: ALT 18; BUN 19; Creatinine, Ser 1.14; Hemoglobin 13.6; Platelets 125.0; Potassium 4.8; Sodium 140    Lipid Panel    Component Value Date/Time   CHOL 124 05/05/2021 0756   CHOL 134 10/18/2017 1447    TRIG 121.0 05/05/2021 0756   HDL 44.20 05/05/2021 0756   HDL 41 10/18/2017 1447   CHOLHDL 3 05/05/2021 0756   VLDL 24.2 05/05/2021 0756   LDLCALC 56 05/05/2021 0756   LDLCALC 63 10/18/2017 1447   LDLDIRECT 71 10/18/2017 1447   LDLDIRECT 156.0 08/29/2016 1523      Wt Readings from Last 3 Encounters:  07/22/21 125 lb 8 oz (56.9 kg)  06/03/21 126 lb (57.2 kg)  05/07/21 126 lb 3.2 oz (57.2 kg)          07/21/2017    8:06 AM  PAD Screen  Previous PAD dx? No  Previous surgical procedure? No  Pain with walking? No  Feet/toe relief with dangling? No  Painful, non-healing ulcers? No  Extremities discolored? No  ASSESSMENT AND PLAN:  1.  Coronary artery disease involving native coronary arteries without angina: She is overall doing well with no anginal symptoms.  I recommend continuing medical therapy.  2.  Hyperlipidemia: She is taking 20 mg of atorvastatin daily.   I reviewed most recent lipid profile done in March which showed an LDL of 56.  I refilled atorvastatin.  3.  Tobacco use: I again discussed with her the importance of smoking cessation and provided her with instructions of steps to help her quit.  4.  Essential hypertension: Blood pressure is well controlled.  I refilled carvedilol.  5.  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  I checked her blood pressure in the right arm it was 98/60.  Suspect that this is likely due to right subclavian artery or innominate artery stenosis.  Fortunately, she is asymptomatic.  Recommend continuing medical therapy.  Blood pressure should always be checked in the left arm as it reflects more accurately central aortic pressure.    Disposition:   FU with me in 12 months   Signed, Kathlyn Sacramento, MD 07/22/21 [place], [place]

## 2021-08-03 ENCOUNTER — Telehealth: Payer: Self-pay

## 2021-08-04 ENCOUNTER — Other Ambulatory Visit (INDEPENDENT_AMBULATORY_CARE_PROVIDER_SITE_OTHER): Payer: Medicare Other

## 2021-08-04 DIAGNOSIS — E1165 Type 2 diabetes mellitus with hyperglycemia: Secondary | ICD-10-CM

## 2021-08-04 DIAGNOSIS — E7800 Pure hypercholesterolemia, unspecified: Secondary | ICD-10-CM

## 2021-08-04 DIAGNOSIS — E039 Hypothyroidism, unspecified: Secondary | ICD-10-CM | POA: Diagnosis not present

## 2021-08-04 LAB — CBC WITH DIFFERENTIAL/PLATELET
Basophils Absolute: 0.1 K/uL (ref 0.0–0.1)
Basophils Relative: 0.8 % (ref 0.0–3.0)
Eosinophils Absolute: 0.2 K/uL (ref 0.0–0.7)
Eosinophils Relative: 2.3 % (ref 0.0–5.0)
HCT: 41.7 % (ref 36.0–46.0)
Hemoglobin: 13.3 g/dL (ref 12.0–15.0)
Lymphocytes Relative: 42.9 % (ref 12.0–46.0)
Lymphs Abs: 3.1 K/uL (ref 0.7–4.0)
MCHC: 31.8 g/dL (ref 30.0–36.0)
MCV: 89.6 fl (ref 78.0–100.0)
Monocytes Absolute: 0.4 K/uL (ref 0.1–1.0)
Monocytes Relative: 5.7 % (ref 3.0–12.0)
Neutro Abs: 3.4 K/uL (ref 1.4–7.7)
Neutrophils Relative %: 48.3 % (ref 43.0–77.0)
Platelets: 122 K/uL — ABNORMAL LOW (ref 150.0–400.0)
RBC: 4.66 Mil/uL (ref 3.87–5.11)
RDW: 16.9 % — ABNORMAL HIGH (ref 11.5–15.5)
WBC: 7.1 K/uL (ref 4.0–10.5)

## 2021-08-04 LAB — BASIC METABOLIC PANEL
BUN: 18 mg/dL (ref 6–23)
CO2: 28 mEq/L (ref 19–32)
Calcium: 9.1 mg/dL (ref 8.4–10.5)
Chloride: 104 mEq/L (ref 96–112)
Creatinine, Ser: 1.18 mg/dL (ref 0.40–1.20)
GFR: 45.12 mL/min — ABNORMAL LOW (ref >60.00)
Glucose, Bld: 153 mg/dL — ABNORMAL HIGH (ref 70–99)
Potassium: 4.2 mEq/L (ref 3.5–5.1)
Sodium: 139 mEq/L (ref 135–145)

## 2021-08-04 LAB — LIPID PANEL
Cholesterol: 120 mg/dL (ref 0–200)
HDL: 38 mg/dL — ABNORMAL LOW
LDL Cholesterol: 49 mg/dL (ref 0–99)
NonHDL: 82.07
Total CHOL/HDL Ratio: 3
Triglycerides: 166 mg/dL — ABNORMAL HIGH (ref 0.0–149.0)
VLDL: 33.2 mg/dL (ref 0.0–40.0)

## 2021-08-04 LAB — TSH: TSH: 2.92 u[IU]/mL (ref 0.35–5.50)

## 2021-08-04 LAB — HEPATIC FUNCTION PANEL
ALT: 12 U/L (ref 0–35)
AST: 13 U/L (ref 0–37)
Albumin: 4 g/dL (ref 3.5–5.2)
Alkaline Phosphatase: 93 U/L (ref 39–117)
Bilirubin, Direct: 0.1 mg/dL (ref 0.0–0.3)
Total Bilirubin: 0.4 mg/dL (ref 0.2–1.2)
Total Protein: 6.4 g/dL (ref 6.0–8.3)

## 2021-08-04 LAB — HEMOGLOBIN A1C: Hgb A1c MFr Bld: 7.2 % — ABNORMAL HIGH (ref 4.6–6.5)

## 2021-08-04 LAB — MICROALBUMIN / CREATININE URINE RATIO
Creatinine,U: 80.8 mg/dL
Microalb Creat Ratio: 1 mg/g (ref 0.0–30.0)
Microalb, Ur: 0.8 mg/dL (ref 0.0–1.9)

## 2021-08-09 ENCOUNTER — Encounter: Payer: Self-pay | Admitting: Internal Medicine

## 2021-08-09 ENCOUNTER — Ambulatory Visit (INDEPENDENT_AMBULATORY_CARE_PROVIDER_SITE_OTHER): Payer: Medicare Other | Admitting: Internal Medicine

## 2021-08-09 DIAGNOSIS — F439 Reaction to severe stress, unspecified: Secondary | ICD-10-CM | POA: Diagnosis not present

## 2021-08-09 DIAGNOSIS — I700 Atherosclerosis of aorta: Secondary | ICD-10-CM | POA: Diagnosis not present

## 2021-08-09 DIAGNOSIS — Z0000 Encounter for general adult medical examination without abnormal findings: Secondary | ICD-10-CM

## 2021-08-09 DIAGNOSIS — D696 Thrombocytopenia, unspecified: Secondary | ICD-10-CM | POA: Diagnosis not present

## 2021-08-09 DIAGNOSIS — E1165 Type 2 diabetes mellitus with hyperglycemia: Secondary | ICD-10-CM

## 2021-08-09 DIAGNOSIS — I10 Essential (primary) hypertension: Secondary | ICD-10-CM | POA: Diagnosis not present

## 2021-08-09 DIAGNOSIS — E039 Hypothyroidism, unspecified: Secondary | ICD-10-CM

## 2021-08-09 DIAGNOSIS — Z1211 Encounter for screening for malignant neoplasm of colon: Secondary | ICD-10-CM | POA: Diagnosis not present

## 2021-08-09 DIAGNOSIS — E7800 Pure hypercholesterolemia, unspecified: Secondary | ICD-10-CM | POA: Diagnosis not present

## 2021-08-09 DIAGNOSIS — Z87891 Personal history of nicotine dependence: Secondary | ICD-10-CM | POA: Diagnosis not present

## 2021-08-09 DIAGNOSIS — K2100 Gastro-esophageal reflux disease with esophagitis, without bleeding: Secondary | ICD-10-CM

## 2021-08-09 DIAGNOSIS — R911 Solitary pulmonary nodule: Secondary | ICD-10-CM | POA: Diagnosis not present

## 2021-08-09 DIAGNOSIS — I779 Disorder of arteries and arterioles, unspecified: Secondary | ICD-10-CM

## 2021-08-09 DIAGNOSIS — I2510 Atherosclerotic heart disease of native coronary artery without angina pectoris: Secondary | ICD-10-CM

## 2021-08-09 NOTE — Progress Notes (Signed)
Patient ID: Ann Collins, female   DOB: [birth year], 76 y.o.   MRN: [id number]   Subjective:    Patient ID: Ann Collins, female    DOB: 01/26/1946, 76 y.o.   MRN: [id number]   Patient here for a scheduled follow up.   Chief Complaint  Patient presents with   Diabetes   Hypothyroidism   Hypertension   .   HPI Saw cardiology 6/15 - no anginal symptoms.  Recommended continuing medical therapy.  She denies any chest pain or sob reported.  No abdominal pain or bowel change reported.  Labs reviewed.  Recent a1c 7.2.     Past Medical History:  Diagnosis Date   Abdominal hernia    CAD (coronary artery disease)    a. LHC 7/19: LM nl, p-mLAD 100% w/ right to left collaterals, OM2 90% (2 mm), 1st LPL 40%, medical management   Diabetes mellitus ([place])    Diverticulosis    GERD (gastroesophageal reflux disease)    Hematuria    Hypercholesterolemia    Hypertension    Hypothyroidism    Kidney stones    Past Surgical History:  Procedure Laterality Date   APPENDECTOMY     BREAST BIOPSY  1969   right   CHOLECYSTECTOMY  2005   COLONOSCOPY     COLONOSCOPY WITH PROPOFOL N/A 09/19/2017   Procedure: COLONOSCOPY WITH PROPOFOL;  Surgeon: Toledo, Benay Pike, MD;  Location: [facility];  Service: Gastroenterology;  Laterality: N/A;   ESOPHAGOGASTRODUODENOSCOPY (EGD) WITH PROPOFOL N/A 09/19/2017   Procedure: ESOPHAGOGASTRODUODENOSCOPY (EGD) WITH PROPOFOL;  Surgeon: Toledo, Benay Pike, MD;  Location: [facility];  Service: Gastroenterology;  Laterality: N/A;   LEFT HEART CATH AND CORONARY ANGIOGRAPHY Left 08/07/2017   Procedure: LEFT HEART CATH AND CORONARY ANGIOGRAPHY;  Surgeon: Wellington Hampshire, MD;  Location: [facility];  Service: Cardiovascular;  Laterality: Left;   UPPER GI ENDOSCOPY     Family History  Problem Relation Age of Onset   Brain cancer Mother        history of brain tumor   Lung disease Father    Diabetes Brother        x2   Bipolar disorder Son    Alcohol  abuse Son    Breast cancer Neg Hx    Colon cancer Neg Hx    Social History   Socioeconomic History   Marital status: Married    Spouse name: Not on file   Number of children: Not on file   Years of education: Not on file   Highest education level: Not on file  Occupational History   Not on file  Tobacco Use   Smoking status: Every Day    Packs/day: 1.00    Years: 50.00    Total pack years: 50.00    Types: Cigarettes   Smokeless tobacco: Never  Vaping Use   Vaping Use: Never used  Substance and Sexual Activity   Alcohol use: No    Alcohol/week: 0.0 standard drinks of alcohol   Drug use: No   Sexual activity: Not on file  Other Topics Concern   Not on file  Social History Narrative   Not on file   Social Determinants of Health   Financial Resource Strain: Medium Risk (02/19/2021)   Overall Financial Resource Strain (CARDIA)    Difficulty of Paying Living Expenses: Somewhat hard  Food Insecurity: No Food Insecurity (06/03/2021)   Hunger Vital Sign    Worried About Running Out of Food in the Last  Year: Never true    [place] in the Last Year: Never true  Transportation Needs: No Transportation Needs (06/03/2021)   PRAPARE - Hydrologist (Medical): No    Lack of Transportation (Non-Medical): No  Physical Activity: Sufficiently Active (06/03/2021)   Exercise Vital Sign    Days of Exercise per Week: 3 days    Minutes of Exercise per Session: 60 min  Stress: Stress Concern Present (09/07/2020)   [place]    Feeling of Stress : Rather much  Social Connections: Unknown (06/03/2021)   Social Connection and Isolation Panel [NHANES]    Frequency of Communication with Friends and Family: Not on file    Frequency of Social Gatherings with Friends and Family: Not on file    Attends Religious Services: Not on file    Active Member of Clubs or Organizations: Not on file    Attends  Archivist Meetings: Not on file    Marital Status: Married     Review of Systems  Constitutional:  Negative for appetite change and unexpected weight change.  HENT:  Negative for congestion and sinus pressure.   Respiratory:  Negative for cough, chest tightness and shortness of breath.   Cardiovascular:  Negative for chest pain, palpitations and leg swelling.  Gastrointestinal:  Negative for abdominal pain, diarrhea, nausea and vomiting.  Genitourinary:  Negative for difficulty urinating and dysuria.  Musculoskeletal:  Negative for joint swelling and myalgias.  Skin:  Negative for color change and rash.  Neurological:  Negative for dizziness, light-headedness and headaches.  Psychiatric/Behavioral:  Negative for agitation and dysphoric mood.        Objective:     BP 130/74 (BP Location: Left Arm, Patient Position: Sitting, Cuff Size: Small)   Pulse 67   Temp 98.1 F (36.7 C) (Temporal)   Resp 15   Ht _0  (1.575 m)   Wt 124 lb 9.6 oz (56.5 kg)   LMP 03/10/1991   SpO2 97%   BMI 22.79 kg/m  Wt Readings from Last 3 Encounters:  08/09/21 124 lb 9.6 oz (56.5 kg)  07/22/21 125 lb 8 oz (56.9 kg)  06/03/21 126 lb (57.2 kg)    Physical Exam Vitals reviewed.  Constitutional:      General: She is not in acute distress.    Appearance: Normal appearance.  HENT:     Head: Normocephalic and atraumatic.     Right Ear: External ear normal.     Left Ear: External ear normal.  Eyes:     General: No scleral icterus.       Right eye: No discharge.        Left eye: No discharge.     Conjunctiva/sclera: Conjunctivae normal.  Neck:     Thyroid: No thyromegaly.  Cardiovascular:     Rate and Rhythm: Normal rate and regular rhythm.  Pulmonary:     Effort: No respiratory distress.     Breath sounds: Normal breath sounds. No wheezing.  Abdominal:     General: Bowel sounds are normal.     Palpations: Abdomen is soft.     Tenderness: There is no abdominal tenderness.   Musculoskeletal:        General: No swelling or tenderness.     Cervical back: Neck supple. No tenderness.  Lymphadenopathy:     Cervical: No cervical adenopathy.  Skin:    Findings: No erythema or rash.  Neurological:  Mental Status: She is alert.  Psychiatric:        Mood and Affect: Mood normal.        Behavior: Behavior normal.      Outpatient Encounter Medications as of 08/09/2021  Medication Sig   aspirin 81 MG tablet Take 81 mg by mouth daily.   atorvastatin (LIPITOR) 20 MG tablet TAKE 1 TABLET(20 MG) BY MOUTH DAILY   blood glucose meter kit and supplies Dispense based on patient and insurance preference. Use up to four times daily as directed. (FOR ICD-10 E10.9, E11.9).   carvedilol (COREG) 3.125 MG tablet TAKE 1 TABLET(3.125 MG) BY MOUTH TWICE DAILY   dapagliflozin propanediol (FARXIGA) 10 MG TABS tablet Take 1 tablet (10 mg total) by mouth in the morning.   glucose blood test strip Use as instructed to blood sugar twice daily   levothyroxine (SYNTHROID) 112 MCG tablet TAKE 1 TABLET(112 MCG) BY MOUTH DAILY BEFORE  BREAKFAST   lisinopril (ZESTRIL) 5 MG tablet Take 1 tablet (5 mg total) by mouth daily.   pantoprazole (PROTONIX) 40 MG tablet Take 1 tablet (40 mg total) by mouth daily as needed.   Semaglutide (OZEMPIC, 0.25 OR 0.5 MG/DOSE, [place]) Inject 0.5 mg into the skin once a week.   triamcinolone (KENALOG) 0.1 % Apply 1 application topically 2 (two) times daily.   vitamin B-12 (CYANOCOBALAMIN) 1000 MCG tablet Take 1,000 mcg by mouth daily.   No facility-administered encounter medications on file as of 08/09/2021.     Lab Results  Component Value Date   WBC 7.1 08/04/2021   HGB 13.3 08/04/2021   HCT 41.7 08/04/2021   PLT 122.0 (L) 08/04/2021   GLUCOSE 153 (H) 08/04/2021   CHOL 120 08/04/2021   TRIG 166.0 (H) 08/04/2021   HDL 38.00 (L) 08/04/2021   LDLDIRECT 71 10/18/2017   LDLCALC 49 08/04/2021   ALT 12 08/04/2021   AST 13 08/04/2021   NA 139 08/04/2021   K  4.2 08/04/2021   CL 104 08/04/2021   CREATININE 1.18 08/04/2021   BUN 18 08/04/2021   CO2 28 08/04/2021   TSH 2.92 08/04/2021   INR 0.87 08/07/2017   HGBA1C 7.2 (H) 08/04/2021   MICROALBUR 0.8 08/04/2021    MM 3D SCREEN BREAST BILATERAL  Result Date: 07/06/2021 CLINICAL DATA:  Screening. EXAM: DIGITAL SCREENING BILATERAL MAMMOGRAM WITH TOMOSYNTHESIS AND CAD TECHNIQUE: Bilateral screening digital craniocaudal and mediolateral oblique mammograms were obtained. Bilateral screening digital breast tomosynthesis was performed. The images were evaluated with computer-aided detection. COMPARISON:  Previous exam(s). ACR Breast Density Category b: There are scattered areas of fibroglandular density. FINDINGS: There are no findings suspicious for malignancy. IMPRESSION: No mammographic evidence of malignancy. A result letter of this screening mammogram will be mailed directly to the patient. RECOMMENDATION: Screening mammogram in one year. (Code:SM-B-01Y) BI-RADS CATEGORY  1: Negative. Electronically Signed   By: Ammie Ferrier M.D.   On: 07/06/2021 15:41       Assessment & Plan:   Problem List Items Addressed This Visit     Aortic atherosclerosis ([place])    Continue lipitor.       Carotid artery disease ([place])    Carotid ultrasound 06/02/21:  Right Carotid: Velocities in the right ICA are consistent with a 1-39% stenosis.                Non-hemodynamically significant plaque <50% noted in the CCA. The                ECA appears <50%  stenosed.   Left Carotid: There was no evidence of thrombus, dissection, atherosclerotic               plaque or stenosis in the cervical carotid system.  Continue lipitor.       Colon cancer screening    Colonoscopy 09/2017 - internal hemorrhoids and 2 3-76m polyps.  Continue f/u with GI.       Diabetes mellitus ([place]    On ozempic.  Appetite has improved.  Weight is relatively stable.  Wants to continue ozempic and current medication regimen.  a1c 7.2.   Follow met b and a1c.       Relevant Orders   Hemoglobin AE7O  Basic metabolic panel   GERD (gastroesophageal reflux disease)    No upper symptoms reported. On protonix.       Health care maintenance    Physical today 08/09/21.  Mammogram 07/06/21 - Birads I.  Colonoscopy 09/2017 - recommended f/u in 5 years.       Hypercholesterolemia    Continue lipitor.  Follow lipid panel and liver function tests.        Relevant Orders   Hepatic function panel   Lipid panel   Hypertension    Continue lisinopril.  Blood pressure doing well.  Follow pressures.  Follow metabolic panel.       Hypothyroidism    On thyroid replacement.  Follow tsh.      Lung nodule    CT 05/11/21 - Lung-RADS 2S, benign appearance or behavior. Continue annual screening with low-dose chest CT without contrast in 12 months.      Personal history of tobacco use, presenting hazards to health    Discussed the need to quit.  Desires not to quit at this time.  Screening chest CT as outlined.       Stress    Increased stress.  Discussed.  Does not feel needs any further intervention.  Follow.        [place]rombocytopenia ([place]    Follow cbc.       Relevant Orders   CBC with Differential/Platelet     CEinar Pheasant MD

## 2021-08-09 NOTE — Assessment & Plan Note (Signed)
Physical today 08/09/21.  Mammogram 07/06/21 - Birads I.  Colonoscopy 09/2017 - recommended f/u in 5 years.

## 2021-08-15 ENCOUNTER — Encounter: Payer: Self-pay | Admitting: Internal Medicine

## 2021-08-15 NOTE — Assessment & Plan Note (Signed)
Discussed the need to quit.  Desires not to quit at this time.  Screening chest CT as outlined.  

## 2021-08-15 NOTE — Assessment & Plan Note (Addendum)
Carotid ultrasound 06/02/21:  Right Carotid: Velocities in the right ICA are consistent with a 1-39% stenosis.                Non-hemodynamically significant plaque <50% noted in the CCA. The                ECA appears <50% stenosed.   Left Carotid: There was no evidence of thrombus, dissection, atherosclerotic               plaque or stenosis in the cervical carotid system.  Continue lipitor.  

## 2021-08-15 NOTE — Assessment & Plan Note (Signed)
Continue lipitor.  Follow lipid panel and liver function tests.   

## 2021-08-15 NOTE — Assessment & Plan Note (Signed)
On ozempic.  Appetite has improved.  Weight is relatively stable.  Wants to continue ozempic and current medication regimen.  a1c 7.2.  Follow met b and a1c.

## 2021-08-15 NOTE — Assessment & Plan Note (Signed)
Continue lipitor  ?

## 2021-08-15 NOTE — Assessment & Plan Note (Signed)
Continue lisinopril.  Blood pressure doing well.  Follow pressures.  Follow metabolic panel.  

## 2021-08-15 NOTE — Assessment & Plan Note (Signed)
Increased stress.  Discussed.  Does not feel needs any further intervention.  Follow.   

## 2021-08-15 NOTE — Assessment & Plan Note (Signed)
No upper symptoms reported.  On protonix.   

## 2021-08-15 NOTE — Assessment & Plan Note (Signed)
On thyroid replacement.  Follow tsh.  

## 2021-08-15 NOTE — Assessment & Plan Note (Signed)
Follow cbc.  

## 2021-08-15 NOTE — Assessment & Plan Note (Signed)
CT 05/11/21 - Lung-RADS 2S, benign appearance or behavior. Continue annual screening with low-dose chest CT without contrast in 12 months. 

## 2021-08-15 NOTE — Assessment & Plan Note (Signed)
Colonoscopy 09/2017 - internal hemorrhoids and 2 3-4mm polyps.  Continue f/u with GI.  

## 2021-10-22 ENCOUNTER — Other Ambulatory Visit: Payer: Self-pay | Admitting: Internal Medicine

## 2021-10-22 DIAGNOSIS — E039 Hypothyroidism, unspecified: Secondary | ICD-10-CM

## 2021-10-25 DIAGNOSIS — Z23 Encounter for immunization: Secondary | ICD-10-CM | POA: Diagnosis not present

## 2021-11-04 ENCOUNTER — Telehealth: Payer: Self-pay

## 2021-11-04 NOTE — Telephone Encounter (Signed)
Informed pt that we received 5 boxes of her Semaglutide (OZEMPIC, 0.25 OR 0.5 MG/DOSE in ofc today 11/04/21 & she is able to come p/u medicine M-F 8-5 or at her earliest convenience.  Pt verbalized understanding to info & medicine placed in pt assistance fridge.

## 2021-11-08 NOTE — Telephone Encounter (Signed)
Pt has picked up medication.  

## 2021-12-08 ENCOUNTER — Other Ambulatory Visit (INDEPENDENT_AMBULATORY_CARE_PROVIDER_SITE_OTHER): Payer: Medicare Other

## 2021-12-08 DIAGNOSIS — E1165 Type 2 diabetes mellitus with hyperglycemia: Secondary | ICD-10-CM | POA: Diagnosis not present

## 2021-12-08 DIAGNOSIS — D696 Thrombocytopenia, unspecified: Secondary | ICD-10-CM | POA: Diagnosis not present

## 2021-12-08 DIAGNOSIS — E7800 Pure hypercholesterolemia, unspecified: Secondary | ICD-10-CM

## 2021-12-08 LAB — BASIC METABOLIC PANEL
BUN: 30 mg/dL — ABNORMAL HIGH (ref 6–23)
CO2: 28 mEq/L (ref 19–32)
Calcium: 9.3 mg/dL (ref 8.4–10.5)
Chloride: 106 mEq/L (ref 96–112)
Creatinine, Ser: 1.24 mg/dL — ABNORMAL HIGH (ref 0.40–1.20)
GFR: 42.41 mL/min — ABNORMAL LOW (ref >60.00)
Glucose, Bld: 141 mg/dL — ABNORMAL HIGH (ref 70–99)
Potassium: 4.6 mEq/L (ref 3.5–5.1)
Sodium: 140 mEq/L (ref 135–145)

## 2021-12-08 LAB — CBC WITH DIFFERENTIAL/PLATELET
Basophils Absolute: 0.1 10*3/uL (ref 0.0–0.1)
Basophils Relative: 1.3 % (ref 0.0–3.0)
Eosinophils Absolute: 0.3 10*3/uL (ref 0.0–0.7)
Eosinophils Relative: 4.1 % (ref 0.0–5.0)
HCT: 42.2 % (ref 36.0–46.0)
Hemoglobin: 13.6 g/dL (ref 12.0–15.0)
Lymphocytes Relative: 50.7 % — ABNORMAL HIGH (ref 12.0–46.0)
Lymphs Abs: 3.8 10*3/uL (ref 0.7–4.0)
MCHC: 32.4 g/dL (ref 30.0–36.0)
MCV: 88.9 fl (ref 78.0–100.0)
Monocytes Absolute: 0.5 10*3/uL (ref 0.1–1.0)
Monocytes Relative: 6.7 % (ref 3.0–12.0)
Neutro Abs: 2.8 10*3/uL (ref 1.4–7.7)
Neutrophils Relative %: 37.2 % — ABNORMAL LOW (ref 43.0–77.0)
Platelets: 130 10*3/uL — ABNORMAL LOW (ref 150.0–400.0)
RBC: 4.74 Mil/uL (ref 3.87–5.11)
RDW: 16.4 % — ABNORMAL HIGH (ref 11.5–15.5)
WBC: 7.5 10*3/uL (ref 4.0–10.5)

## 2021-12-08 LAB — LIPID PANEL
Cholesterol: 142 mg/dL (ref 0–200)
HDL: 49 mg/dL (ref >39.00)
NonHDL: 92.94
Total CHOL/HDL Ratio: 3
Triglycerides: 215 mg/dL — ABNORMAL HIGH (ref 0.0–149.0)
VLDL: 43 mg/dL — ABNORMAL HIGH (ref 0.0–40.0)

## 2021-12-08 LAB — HEPATIC FUNCTION PANEL
ALT: 16 U/L (ref 0–35)
AST: 15 U/L (ref 0–37)
Albumin: 4 g/dL (ref 3.5–5.2)
Alkaline Phosphatase: 90 U/L (ref 39–117)
Bilirubin, Direct: 0.1 mg/dL (ref 0.0–0.3)
Total Bilirubin: 0.4 mg/dL (ref 0.2–1.2)
Total Protein: 6.5 g/dL (ref 6.0–8.3)

## 2021-12-08 LAB — LDL CHOLESTEROL, DIRECT: Direct LDL: 64 mg/dL

## 2021-12-08 LAB — HEMOGLOBIN A1C: Hgb A1c MFr Bld: 7.9 % — ABNORMAL HIGH (ref 4.6–6.5)

## 2021-12-10 ENCOUNTER — Ambulatory Visit (INDEPENDENT_AMBULATORY_CARE_PROVIDER_SITE_OTHER): Payer: Medicare Other | Admitting: Internal Medicine

## 2021-12-10 ENCOUNTER — Encounter: Payer: Self-pay | Admitting: Internal Medicine

## 2021-12-10 VITALS — BP 136/72 | HR 86 | Temp 98.4°F | Resp 17 | Ht 62.0 in | Wt 133.4 lb

## 2021-12-10 DIAGNOSIS — I700 Atherosclerosis of aorta: Secondary | ICD-10-CM

## 2021-12-10 DIAGNOSIS — E039 Hypothyroidism, unspecified: Secondary | ICD-10-CM | POA: Diagnosis not present

## 2021-12-10 DIAGNOSIS — K2100 Gastro-esophageal reflux disease with esophagitis, without bleeding: Secondary | ICD-10-CM

## 2021-12-10 DIAGNOSIS — E1165 Type 2 diabetes mellitus with hyperglycemia: Secondary | ICD-10-CM | POA: Diagnosis not present

## 2021-12-10 DIAGNOSIS — I10 Essential (primary) hypertension: Secondary | ICD-10-CM

## 2021-12-10 DIAGNOSIS — D696 Thrombocytopenia, unspecified: Secondary | ICD-10-CM | POA: Diagnosis not present

## 2021-12-10 DIAGNOSIS — E7800 Pure hypercholesterolemia, unspecified: Secondary | ICD-10-CM | POA: Diagnosis not present

## 2021-12-10 DIAGNOSIS — I779 Disorder of arteries and arterioles, unspecified: Secondary | ICD-10-CM | POA: Diagnosis not present

## 2021-12-10 DIAGNOSIS — I2510 Atherosclerotic heart disease of native coronary artery without angina pectoris: Secondary | ICD-10-CM | POA: Diagnosis not present

## 2021-12-10 DIAGNOSIS — R911 Solitary pulmonary nodule: Secondary | ICD-10-CM | POA: Diagnosis not present

## 2021-12-10 DIAGNOSIS — R0981 Nasal congestion: Secondary | ICD-10-CM

## 2021-12-10 LAB — HM DIABETES FOOT EXAM

## 2021-12-10 MED ORDER — CEFDINIR 300 MG PO CAPS: 300.0000 mg | ORAL_CAPSULE | Freq: Two times a day (BID) | ORAL | 0 refills | Status: DC

## 2021-12-10 NOTE — Patient Instructions (Signed)
Saline nasal spray - flush nose at least 2x/day  Nasacort nasal spray - 2 sprays each nostril one time per day.  Do this in the evening.    Take a probioitic daily while on the antibiotic and for two weeks after completing the antibiotic.

## 2021-12-10 NOTE — Progress Notes (Unsigned)
Patient ID: Ann Collins, female   DOB: [birth year], 76 y.o.   MRN: [id number]   Subjective:    Patient ID: Ann Collins, female    DOB: [birth year], 76 y.o.   MRN: [id number]   Patient here for  Chief Complaint  Patient presents with   Follow-up   Diabetes   Hypertension   .   HPI Reports for at least a couple of weeks (or more) she has had increased congestion.  Some headache - worse am.  Increased head/nasal congestion.  Yellow/green mucus production.  Bad taste.  No chest tightness or sob.  No increased cough.  No acid reflux reported. No abdominal pain or bowel change.  Still taking ozempic.  On .25 now.  Appetite is better.  Eating better.  Intolerance to metformin.     Past Medical History:  Diagnosis Date   Abdominal hernia    CAD (coronary artery disease)    a. LHC 7/19: LM nl, p-mLAD 100% w/ right to left collaterals, OM2 90% (2 mm), 1st LPL 40%, medical management   Diabetes mellitus ([place])    Diverticulosis    GERD (gastroesophageal reflux disease)    Hematuria    Hypercholesterolemia    Hypertension    Hypothyroidism    Kidney stones    Past Surgical History:  Procedure Laterality Date   APPENDECTOMY     BREAST BIOPSY  1969   right   CHOLECYSTECTOMY  2005   COLONOSCOPY     COLONOSCOPY WITH PROPOFOL N/A 09/19/2017   Procedure: COLONOSCOPY WITH PROPOFOL;  Surgeon: Toledo, Benay Pike, MD;  Location: [facility];  Service: Gastroenterology;  Laterality: N/A;   ESOPHAGOGASTRODUODENOSCOPY (EGD) WITH PROPOFOL N/A 09/19/2017   Procedure: ESOPHAGOGASTRODUODENOSCOPY (EGD) WITH PROPOFOL;  Surgeon: Toledo, Benay Pike, MD;  Location: [facility];  Service: Gastroenterology;  Laterality: N/A;   LEFT HEART CATH AND CORONARY ANGIOGRAPHY Left 08/07/2017   Procedure: LEFT HEART CATH AND CORONARY ANGIOGRAPHY;  Surgeon: Wellington Hampshire, MD;  Location: [facility];  Service: Cardiovascular;  Laterality: Left;   UPPER GI ENDOSCOPY     Family History  Problem  Relation Age of Onset   Brain cancer Mother        history of brain tumor   Lung disease Father    Diabetes Brother        x2   Bipolar disorder Son    Alcohol abuse Son    Breast cancer Neg Hx    Colon cancer Neg Hx    Social History   Socioeconomic History   Marital status: Married    Spouse name: Not on file   Number of children: Not on file   Years of education: Not on file   Highest education level: Not on file  Occupational History   Not on file  Tobacco Use   Smoking status: Every Day    Packs/day: 1.00    Years: 50.00    Total pack years: 50.00    Types: Cigarettes   Smokeless tobacco: Never  Vaping Use   Vaping Use: Never used  Substance and Sexual Activity   Alcohol use: No    Alcohol/week: 0.0 standard drinks of alcohol   Drug use: No   Sexual activity: Not on file  Other Topics Concern   Not on file  Social History Narrative   Not on file   Social Determinants of Health   Financial Resource Strain: Medium Risk (02/19/2021)   Overall Financial Resource Strain (CARDIA)  Difficulty of Paying Living Expenses: Somewhat hard  Food Insecurity: No Food Insecurity (06/03/2021)   Hunger Vital Sign    Worried About Running Out of Food in the Last Year: Never true    Ran Out of Food in the Last Year: Never true  Transportation Needs: No Transportation Needs (06/03/2021)   PRAPARE - Hydrologist (Medical): No    Lack of Transportation (Non-Medical): No  Physical Activity: Sufficiently Active (06/03/2021)   Exercise Vital Sign    Days of Exercise per Week: 3 days    Minutes of Exercise per Session: 60 min  Stress: Stress Concern Present (09/07/2020)   [place]    Feeling of Stress : Rather much  Social Connections: Unknown (06/03/2021)   Social Connection and Isolation Panel [NHANES]    Frequency of Communication with Friends and Family: Not on file    Frequency of  Social Gatherings with Friends and Family: Not on file    Attends Religious Services: Not on file    Active Member of Clubs or Organizations: Not on file    Attends Archivist Meetings: Not on file    Marital Status: Married     Review of Systems  Constitutional:  Negative for fever.       Appetite is better.   HENT:  Positive for congestion and sinus pressure.   Respiratory:  Negative for chest tightness and shortness of breath.        No increased cough.   Cardiovascular:  Negative for chest pain, palpitations and leg swelling.  Gastrointestinal:  Negative for abdominal pain, diarrhea, nausea and vomiting.  Genitourinary:  Negative for difficulty urinating and dysuria.  Musculoskeletal:  Negative for joint swelling and myalgias.  Skin:  Negative for color change and rash.  Neurological:  Negative for dizziness.       Headache as outlined.   Psychiatric/Behavioral:  Negative for agitation and dysphoric mood.        Objective:     BP 136/72   Pulse 86   Temp 98.4 F (36.9 C) (Temporal)   Resp 17   Ht _0  (1.575 m)   Wt 133 lb 6.4 oz (60.5 kg)   LMP 03/10/1991   SpO2 98%   BMI 24.40 kg/m  Wt Readings from Last 3 Encounters:  12/10/21 133 lb 6.4 oz (60.5 kg)  08/09/21 124 lb 9.6 oz (56.5 kg)  07/22/21 125 lb 8 oz (56.9 kg)    Physical Exam Vitals reviewed.  Constitutional:      General: She is not in acute distress.    Appearance: Normal appearance.  HENT:     Head: Normocephalic and atraumatic.     Right Ear: External ear normal.     Left Ear: External ear normal.  Eyes:     General: No scleral icterus.       Right eye: No discharge.        Left eye: No discharge.     Conjunctiva/sclera: Conjunctivae normal.  Neck:     Thyroid: No thyromegaly.  Cardiovascular:     Rate and Rhythm: Normal rate and regular rhythm.  Pulmonary:     Effort: No respiratory distress.     Breath sounds: Normal breath sounds. No wheezing.  Abdominal:     General:  Bowel sounds are normal.     Palpations: Abdomen is soft.     Tenderness: There is no abdominal tenderness.  Musculoskeletal:  General: No swelling or tenderness.     Cervical back: Neck supple. No tenderness.  Lymphadenopathy:     Cervical: No cervical adenopathy.  Skin:    Findings: No erythema or rash.  Neurological:     Mental Status: She is alert.  Psychiatric:        Mood and Affect: Mood normal.        Behavior: Behavior normal.      Outpatient Encounter Medications as of 12/10/2021  Medication Sig   aspirin 81 MG tablet Take 81 mg by mouth daily.   atorvastatin (LIPITOR) 20 MG tablet TAKE 1 TABLET(20 MG) BY MOUTH DAILY   blood glucose meter kit and supplies Dispense based on patient and insurance preference. Use up to four times daily as directed. (FOR ICD-10 E10.9, E11.9).   carvedilol (COREG) 3.125 MG tablet TAKE 1 TABLET(3.125 MG) BY MOUTH TWICE DAILY   cefdinir (OMNICEF) 300 MG capsule Take 1 capsule (300 mg total) by mouth 2 (two) times daily.   dapagliflozin propanediol (FARXIGA) 10 MG TABS tablet Take 1 tablet (10 mg total) by mouth in the morning.   glucose blood test strip Use as instructed to blood sugar twice daily   levothyroxine (SYNTHROID) 112 MCG tablet TAKE 1 TABLET(112 MCG) BY MOUTH DAILY BEFORE AND BREAKFAST   lisinopril (ZESTRIL) 5 MG tablet Take 1 tablet (5 mg total) by mouth daily.   pantoprazole (PROTONIX) 40 MG tablet Take 1 tablet (40 mg total) by mouth daily as needed.   Semaglutide (OZEMPIC, 0.25 OR 0.5 MG/DOSE, ) Inject 0.5 mg into the skin once a week.   triamcinolone (KENALOG) 0.1 % Apply 1 application topically 2 (two) times daily.   vitamin B-12 (CYANOCOBALAMIN) 1000 MCG tablet Take 1,000 mcg by mouth daily.   No facility-administered encounter medications on file as of 12/10/2021.     Lab Results  Component Value Date   WBC 7.5 12/08/2021   HGB 13.6 12/08/2021   HCT 42.2 12/08/2021   PLT 130.0 (L) 12/08/2021   GLUCOSE 141 (H)  12/08/2021   CHOL 142 12/08/2021   TRIG 215.0 (H) 12/08/2021   HDL 49.00 12/08/2021   LDLDIRECT 64.0 12/08/2021   LDLCALC 49 08/04/2021   ALT 16 12/08/2021   AST 15 12/08/2021   NA 140 12/08/2021   K 4.6 12/08/2021   CL 106 12/08/2021   CREATININE 1.24 (H) 12/08/2021   BUN 30 (H) 12/08/2021   CO2 28 12/08/2021   TSH 2.92 08/04/2021   INR 0.87 08/07/2017   HGBA1C 7.9 (H) 12/08/2021   MICROALBUR 0.8 08/04/2021    MM 3D SCREEN BREAST BILATERAL  Result Date: 07/06/2021 CLINICAL DATA:  Screening. EXAM: DIGITAL SCREENING BILATERAL MAMMOGRAM WITH TOMOSYNTHESIS AND CAD TECHNIQUE: Bilateral screening digital craniocaudal and mediolateral oblique mammograms were obtained. Bilateral screening digital breast tomosynthesis was performed. The images were evaluated with computer-aided detection. COMPARISON:  Previous exam(s). ACR Breast Density Category b: There are scattered areas of fibroglandular density. FINDINGS: There are no findings suspicious for malignancy. IMPRESSION: No mammographic evidence of malignancy. A result letter of this screening mammogram will be mailed directly to the patient. RECOMMENDATION: Screening mammogram in one year. (Code:SM-B-01Y) BI-RADS CATEGORY  1: Negative. Electronically Signed   By: Ammie Ferrier M.D.   On: 07/06/2021 15:41       Assessment & Plan:   Problem List Items Addressed This Visit     Aortic atherosclerosis ([employer])    Continue lipitor.       Carotid artery disease ([place])  Carotid ultrasound 06/02/21:  Right Carotid: Velocities in the right ICA are consistent with a 1-39% stenosis.                Non-hemodynamically significant plaque <50% noted in the CCA. The                ECA appears <50% stenosed.   Left Carotid: There was no evidence of thrombus, dissection, atherosclerotic               plaque or stenosis in the cervical carotid system.  Continue lipitor.       Diabetes mellitus ([place]) - Primary    On ozempic.  Now on .28m dose.  Appetite has improved.  Weight is up some.   Has wanted to continue ozempic and current medication regimen.  a1c increased on recent check.  Discussed other treatment options.  Unable to increase dose of ozempic.  Intolerance to metformin.  Discussed possible initiation of sulfonylurea. Discussed diet and exercise.  Discussed referral to [name].  Agreeable.  Discussed need to check blood sugars.  Follow.       Relevant Orders   AMB Referral to Pharmacy Medication Management   Hemoglobin AG9F  Basic metabolic panel   GERD (gastroesophageal reflux disease)    No upper symptoms reported. On protonix.       Hypercholesterolemia    Continue lipitor.  Follow lipid panel and liver function tests.        Relevant Orders   Hepatic function panel   Lipid panel   Hypertension    Continue lisinopril.  Blood pressure doing well.  Follow pressures.  Follow metabolic panel.       Hypothyroidism    On thyroid replacement.  Follow tsh.      Lung nodule    CT 05/11/21 - Lung-RADS 2S, benign appearance or behavior. Continue annual screening with low-dose chest CT without contrast in 12 months.      Nasal congestion    Increased sinus congestion/nasal congestion as outlined.  No chest congestion or sob.  Saline nasal spray and steroid nasal spray as outlined.  Mucinex.  Omnicef as directed.  Probiotics as directed.  Follow.  Call with update.       [place]rombocytopenia ([place]    Follow cbc.       Relevant Orders   CBC with Differential/Platelet     CEinar Pheasant MD

## 2021-12-11 ENCOUNTER — Encounter: Payer: Self-pay | Admitting: Internal Medicine

## 2021-12-11 NOTE — Assessment & Plan Note (Signed)
On ozempic.  Now on .'25mg'$  dose. Appetite has improved.  Weight is up some.   Has wanted to continue ozempic and current medication regimen.  a1c increased on recent check.  Discussed other treatment options.  Unable to increase dose of ozempic.  Intolerance to metformin.  Discussed possible initiation of sulfonylurea. Discussed diet and exercise.  Discussed referral to [name].  Agreeable.  Discussed need to check blood sugars.  Follow.

## 2021-12-11 NOTE — Assessment & Plan Note (Signed)
Continue lipitor  ?

## 2021-12-11 NOTE — Assessment & Plan Note (Signed)
On thyroid replacement.  Follow tsh.  

## 2021-12-11 NOTE — Assessment & Plan Note (Signed)
No upper symptoms reported.  On protonix.   

## 2021-12-11 NOTE — Assessment & Plan Note (Signed)
CT 05/11/21 - Lung-RADS 2S, benign appearance or behavior. Continue annual screening with low-dose chest CT without contrast in 12 months. 

## 2021-12-11 NOTE — Assessment & Plan Note (Signed)
Carotid ultrasound 06/02/21:  Right Carotid: Velocities in the right ICA are consistent with a 1-39% stenosis.                Non-hemodynamically significant plaque <50% noted in the CCA. The                ECA appears <50% stenosed.   Left Carotid: There was no evidence of thrombus, dissection, atherosclerotic               plaque or stenosis in the cervical carotid system.  Continue lipitor.  

## 2021-12-11 NOTE — Assessment & Plan Note (Signed)
Continue lisinopril.  Blood pressure doing well.  Follow pressures.  Follow metabolic panel.  

## 2021-12-11 NOTE — Assessment & Plan Note (Signed)
Continue lipitor.  Follow lipid panel and liver function tests.   

## 2021-12-11 NOTE — Assessment & Plan Note (Signed)
Follow cbc.  

## 2021-12-11 NOTE — Assessment & Plan Note (Signed)
Increased sinus congestion/nasal congestion as outlined.  No chest congestion or sob.  Saline nasal spray and steroid nasal spray as outlined.  Mucinex.  Omnicef as directed.  Probiotics as directed.  Follow.  Call with update.

## 2021-12-14 ENCOUNTER — Telehealth: Payer: Self-pay

## 2021-12-14 NOTE — Progress Notes (Signed)
[facility]) Care Management  [place]   12/14/2021  BARBIE CROSTON Apr 30, 1945 [id number]   2024 Medication Assistance Renewal Application Summary:  Patient was outreached by [place] Team regarding medication assistance renewal for 2024. Verified address, anticipated insurance for 2024, and income has not changed. Patient remains interested in PAP for 2024 for Ozempic and Farxiga, no other new medications were identified for medication assistance.    Plan: I will route patient assistance letter to [place] technician who will coordinate patient assistance program application process for medications listed above.  [facility] pharmacy technician will assist with obtaining all required documents from both patient and provider(s) and submit application(s) once completed.    Thank you for allowing pharmacy to be a part of this patient's care.   Reed Breech, PharmD Clinical Pharmacist  [place] [phone]

## 2021-12-15 ENCOUNTER — Telehealth: Payer: Self-pay

## 2021-12-15 NOTE — Progress Notes (Signed)
   Care Guide Note  12/15/2021 Name: Ann Collins MRN: [id number] DOB: 1945-06-06  Referred by: Einar Pheasant, MD Reason for referral : Care Coordination (Outreach to schedule With Pharm D )   Ann Collins is a 76 y.o. year old female who is a primary care patient of Einar Pheasant, MD. Ann Collins was referred to the pharmacist for assistance related to DM.    Successful contact was made with the patient to discuss pharmacy services including being ready for the pharmacist to call at least 5 minutes before the scheduled appointment time, to have medication bottles and any blood sugar or blood pressure readings ready for review. The patient agreed to meet with the pharmacist via with the pharmacist via telephone visit on 12/29/2021.    Ann Collins, [place], [place] [address] Direct Dial: [phone] [email or web address]

## 2021-12-17 ENCOUNTER — Telehealth: Payer: Self-pay | Admitting: Pharmacy Technician

## 2021-12-17 DIAGNOSIS — Z596 Low income: Secondary | ICD-10-CM

## 2021-12-17 NOTE — Progress Notes (Signed)
[facility])                                            [place] Team    12/17/2021  Ann Collins 27-Jun-1945 [id number]                                      Medication Assistance Referral-FOR 2024 RE ENROLLMENT  Referral From: [place]   Medication/Company: [name] / [employer] Patient application portion:  Education officer, museum portion: Faxed  to Dr. Einar Pheasant Provider address/fax verified via: Office website  Medication/Company: [name] / [employer] Patient application portion:  Mailed Provider application portion: Faxed  to Dr. Einar Pheasant Provider address/fax verified via: Office website  [name] P. [name], [name]  [phone]

## 2021-12-29 ENCOUNTER — Other Ambulatory Visit: Payer: Medicare Other | Admitting: Pharmacist

## 2021-12-29 MED ORDER — OZEMPIC (0.25 OR 0.5 MG/DOSE) 2 MG/3ML ~~LOC~~ SOPN: PEN_INJECTOR | SUBCUTANEOUS | 11 refills | Status: AC

## 2021-12-29 NOTE — Chronic Care Management (AMB) (Signed)
12/29/2021 Name: Ann Collins MRN: [id number] DOB: January 20, 1946  Chief Complaint  Patient presents with   Medication Management    Diabetes    Ann Collins is a 76 y.o. year old female who presented for a telephone visit.   They were referred to the pharmacist by their PCP for assistance in managing diabetes.  Subjective:  Care Team: Primary Care Provider: Einar Pheasant, MD ; Next Scheduled Visit: 03/15/2022  Medication Access/Adherence  Current Pharmacy:  [facility] [place], [place] [address] AT [facility] [address] [place] [address] Phone: [phone] Fax: [place], [place] [address] [place] [address] Phone: [phone] Fax: [phone]   Patient reports affordability concerns with their medications: No  Patient reports access/transportation concerns to their pharmacy: No  Patient reports adherence concerns with their medications:  Yes  Ozempic 0.5 caused too much weight loss (lowest weight was 118 lbs)   Diabetes:  Current medications: Ozempic 0.25 mg weekly, Farxiga 10 mg daily,   Medications tried in the past: Ozempic 0.5 mg weekly (decreased to 0.25 mg d/t weight loss, baseline weight 134 lbs and she was down to 118 lbs), metformin (GI stomach upset on higher doses, does ok on lower doses)  Current glucose readings: checked while on the phone (171 mg/dL) but rarely checks otherwise. Uses test strips and meter she purchased at [employer].  Patient denies hyperglycemic symptoms including polyuria, polydipsia, polyphagia, nocturia, neuropathy, blurred vision. She does endorse she is under a considerable amount of stress.   Current meal patterns: has had problems with her teeth (seen at [facility]). Eating more salt and sugary food. Drinks a lot of diet soda.   Health Maintenance  Health Maintenance Due  Topic Date Due   COVID-19 Vaccine (6 - 2023-24 season) 10/08/2021      Objective: Lab Results  Component Value Date   HGBA1C 7.9 (H) 12/08/2021    Lab Results  Component Value Date   CREATININE 1.24 (H) 12/08/2021   BUN 30 (H) 12/08/2021   NA 140 12/08/2021   K 4.6 12/08/2021   CL 106 12/08/2021   CO2 28 12/08/2021    Lab Results  Component Value Date   CHOL 142 12/08/2021   HDL 49.00 12/08/2021   LDLCALC 49 08/04/2021   LDLDIRECT 64.0 12/08/2021   TRIG 215.0 (H) 12/08/2021   CHOLHDL 3 12/08/2021    Medications Reviewed Today     Reviewed by Pauletta Browns, [place] (Pharmacist) on 12/29/21 at 1000  Med List Status: <None>   Medication Order Taking? Sig Documenting Provider Last Dose Status Informant  aspirin 81 MG tablet [id number] Yes Take 81 mg by mouth daily. [provider] Taking Active   atorvastatin (LIPITOR) 20 MG tablet [id number] Yes TAKE 1 TABLET(20 MG) BY MOUTH DAILY Wellington Hampshire, MD Taking Active   blood glucose meter kit and supplies [id number] Yes Dispense based on patient and insurance preference. Use up to four times daily as directed. (FOR ICD-10 E10.9, E11.9). Einar Pheasant, MD Taking Active   carvedilol (COREG) 3.125 MG tablet [id number] Yes TAKE 1 TABLET(3.125 MG) BY MOUTH TWICE DAILY Wellington Hampshire, MD Taking Active   cefdinir (OMNICEF) 300 MG capsule [id number] No Take 1 capsule (300 mg total) by mouth 2 (two) times daily.  Patient not taking: Reported on 12/29/2021   Einar Pheasant, MD Not Taking Active   dapagliflozin propanediol (  FARXIGA) 10 MG TABS tablet [id number] Yes Take 1 tablet (10 mg total) by mouth in the morning. Einar Pheasant, MD Taking Active   glucose blood test strip [id number] Yes Use as instructed to blood sugar twice daily Einar Pheasant, MD Taking Active   levothyroxine (SYNTHROID) 112 MCG tablet [id number] Yes TAKE 1 TABLET(112 MCG) BY MOUTH DAILY BEFORE AND Laveda Norman, MD Taking Active   lisinopril (ZESTRIL) 5 MG tablet [id number] Yes Take 1 tablet (5 mg total) by  mouth daily. Einar Pheasant, MD Taking Active   pantoprazole (PROTONIX) 40 MG tablet [id number] Yes Take 1 tablet (40 mg total) by mouth daily as needed. Einar Pheasant, MD Taking Active   Semaglutide [facility], 0.25 OR 0.5 MG/DOSE, [place]) [id number] Yes Inject 0.25 mg into the skin once a week. [provider] Taking Active   triamcinolone (KENALOG) 0.1 % [id number] Yes Apply 1 application topically 2 (two) times daily. Einar Pheasant, MD Taking Active   vitamin B-12 (CYANOCOBALAMIN) 1000 MCG tablet [id number] Yes Take 1,000 mcg by mouth daily. [provider] Taking Active               Assessment/Plan:   Diabetes: - Currently close to goal based on A1c (7.9%). She states she is under a lot of stress and has trouble eating healthy foods due to issues with her teeth.  - Reviewed long term cardiovascular and renal outcomes of uncontrolled blood sugar - Reviewed goal A1c, goal fasting, and goal 2 hour post prandial glucose. - Discussed with patient the option to restart low dose metformin or increase Ozempic. Patient is agreeable to increasing Ozempic.  - Recommend to increase Ozempic to 0.17 mg + 10 clicks.   - Recommend to check glucose at least 3x/week   Follow Up Plan:  Phone call in 4 weeks PCP visit in February 2024  Joseph Art, [place].D. PGY-2 Ambulatory Care Pharmacy Resident 12/29/2021 10:17 AM

## 2021-12-29 NOTE — Patient Instructions (Signed)
It was nice talking to you today!  Please increase your Ozempic to 5.14 mg + 10 clicks every week. Please start checking your blood sugar at least 3 times a week. I will call you in 4 weeks to see how your blood sugars are doing.  Take care! Joseph Art, Pharm.D. PGY-2 Ambulatory Care Pharmacy Resident

## 2021-12-29 NOTE — Progress Notes (Signed)
I was available during this visit. I have reviewed the resident's assessment and plan and agree.   [name] Hedwig Morton, PharmD, [place], [facility] [phone]

## 2022-01-16 ENCOUNTER — Telehealth: Payer: Self-pay | Admitting: Pharmacy Technician

## 2022-01-16 DIAGNOSIS — Z596 Low income: Secondary | ICD-10-CM

## 2022-01-16 NOTE — Progress Notes (Signed)
[facility])                                            [place] Team    01/16/2022  Ann Collins 1945/02/23 [id number]  Received patient and provider portion(s) of patient assistance application(s) for Ozempic and [place]. Faxed completed application and required documents into [employer] and [employer].   Chalice Philbert P. Veer [place], [place]  [phone]

## 2022-01-26 ENCOUNTER — Other Ambulatory Visit: Payer: Medicare Other | Admitting: Pharmacist

## 2022-01-26 ENCOUNTER — Other Ambulatory Visit: Payer: Self-pay

## 2022-01-26 ENCOUNTER — Telehealth: Payer: Self-pay | Admitting: Internal Medicine

## 2022-01-26 DIAGNOSIS — E1165 Type 2 diabetes mellitus with hyperglycemia: Secondary | ICD-10-CM

## 2022-01-26 MED ORDER — BLOOD GLUCOSE METER KIT: PACK | 3 refills | Status: AC

## 2022-01-26 MED ORDER — GLUCOSE BLOOD VI STRP: ORAL_STRIP | 12 refills | Status: DC

## 2022-01-26 MED ORDER — BLOOD GLUCOSE METER KIT: PACK | 3 refills | Status: DC

## 2022-01-26 NOTE — Progress Notes (Signed)
I agree with the documentation by the resident.   [name] Hedwig Morton, PharmD, [facility] [phone]

## 2022-01-26 NOTE — Chronic Care Management (AMB) (Signed)
01/26/2022 Name: Ann Collins MRN: [id number] DOB: 1945-10-06  Chief Complaint  Patient presents with   Diabetes    Ann Collins is a 76 y.o. year old female who presented for a telephone visit.   They were referred to the pharmacist by their PCP for assistance in managing diabetes.   Subjective:  Care Team: Primary Care Provider: Einar Pheasant, MD ; Next Scheduled Visit: 03/15/2022  Medication Access/Adherence  Current Pharmacy:  [facility] DRUG STORE [place], [place] AT [facility] [address] [place] [address] Phone: [phone] Fax: [place], [place] [address] [place] [address] Phone: [phone] Fax: [phone]   Patient reports affordability concerns with their medications: No  Patient reports access/transportation concerns to their pharmacy: No  Patient reports adherence concerns with their medications:  No     Diabetes:  Current medications: Ozempic 2.56 mg + 10 clicks, Farxiga 10 mg daily  Medications tried in the past: Ozempic 0.5 mg weekly (decreased to 0.25 mg d/t weight loss), metformin (GI stomach upset on higher doses, does ok on lower doses), Tresiba (started when A1c was 11%), glipizide, Januvia  Current glucose readings: not checking, ran out of strips   Patient denies hypoglycemic s/sx including dizziness, shakiness, sweating. Patient denies hyperglycemic symptoms including polyuria, polydipsia, polyphagia, nocturia, neuropathy, blurred vision.  Current meal patterns: rarely has appetite. Current weight 130 lbs  Current medication access support: Ozempic and Farxiga PAP   Health Maintenance  Health Maintenance Due  Topic Date Due   OPHTHALMOLOGY EXAM  06/22/2021   COVID-19 Vaccine (6 - 2023-24 season) 10/08/2021     Objective: Lab Results  Component Value Date   HGBA1C 7.9 (H) 12/08/2021    Lab Results  Component Value Date   CREATININE  1.24 (H) 12/08/2021   BUN 30 (H) 12/08/2021   NA 140 12/08/2021   K 4.6 12/08/2021   CL 106 12/08/2021   CO2 28 12/08/2021    Lab Results  Component Value Date   CHOL 142 12/08/2021   HDL 49.00 12/08/2021   LDLCALC 49 08/04/2021   LDLDIRECT 64.0 12/08/2021   TRIG 215.0 (H) 12/08/2021   CHOLHDL 3 12/08/2021    Medications Reviewed Today     Reviewed by Pauletta Browns, [place] (Pharmacist) on 12/29/21 at 1029  Med List Status: <None>   Medication Order Taking? Sig Documenting Provider Last Dose Status Informant  aspirin 81 MG tablet [id number] Yes Take 81 mg by mouth daily. [provider] Taking Active   atorvastatin (LIPITOR) 20 MG tablet [id number] Yes TAKE 1 TABLET(20 MG) BY MOUTH DAILY Wellington Hampshire, MD Taking Active   blood glucose meter kit and supplies [id number] Yes Dispense based on patient and insurance preference. Use up to four times daily as directed. (FOR ICD-10 E10.9, E11.9). Einar Pheasant, MD Taking Active   carvedilol (COREG) 3.125 MG tablet [id number] Yes TAKE 1 TABLET(3.125 MG) BY MOUTH TWICE DAILY Wellington Hampshire, MD Taking Active   Patient not taking:  Discontinued 12/29/21 1029 (Completed Course)   dapagliflozin propanediol (FARXIGA) 10 MG TABS tablet [id number] Yes Take 1 tablet (10 mg total) by mouth in the morning. Einar Pheasant, MD Taking Active   glucose blood test strip [id number] Yes Use as instructed to blood sugar twice daily Einar Pheasant, MD Taking Active   levothyroxine (SYNTHROID) 112 MCG tablet [id number] Yes TAKE 1 TABLET(112 MCG) BY MOUTH  DAILY BEFORE AND Laveda Norman, MD Taking Active   lisinopril (ZESTRIL) 5 MG tablet [id number] Yes Take 1 tablet (5 mg total) by mouth daily. Einar Pheasant, MD Taking Active   pantoprazole (PROTONIX) 40 MG tablet [id number] Yes Take 1 tablet (40 mg total) by mouth daily as needed. Einar Pheasant, MD Taking Active   Semaglutide [facility], 0.25 OR 0.5 MG/DOSE, [place]) [id number] Yes Inject 0.25  mg into the skin once a week. [provider] Taking Active   triamcinolone (KENALOG) 0.1 % [id number] Yes Apply 1 application topically 2 (two) times daily. Einar Pheasant, MD Taking Active   vitamin B-12 (CYANOCOBALAMIN) 1000 MCG tablet [id number] Yes Take 1,000 mcg by mouth daily. [provider] Taking Active             Assessment/Plan:   Diabetes: - Currently uncontrolled, but close to goal (A1c 7.9% 12/2021) - Reviewed long term cardiovascular and renal outcomes of uncontrolled blood sugar - Reviewed goal A1c, goal fasting, and goal 2 hour post prandial glucose - Recommend to continue current regimen. Patient is very adamant she does not want to lose weight. If next A1c not at goal, could consider increasing Ozempic by 1 click each week until at goal A1c.  - Recommend to check glucose daily. Will send refill on test strips.  - Farxiga and Ozempic PAP applications have been completed and faxed on 01/16/2022.    Follow Up Plan:  Pharmacist via phone in 3 weeks.  PCP visit February 2024   Joseph Art, [place] Rein.D. PGY-2 Ambulatory Care Pharmacy Resident 01/26/2022 10:18 AM

## 2022-01-26 NOTE — Patient Instructions (Signed)
It was nice talking to you today!  Your goal blood sugar is 80-130 before eating and less than 180 after eating.  We are not making any changes to your medications.  Monitor blood sugars at home and keep a log (glucometer or piece of paper) to bring with you to your next visit.  Keep up the good work with diet and exercise. Aim for a diet full of vegetables, fruit and lean meats (chicken, [place], fish). Try to limit salt intake by eating fresh or frozen vegetables (instead of canned), rinse canned vegetables prior to cooking and do not add any additional salt to meals.   Happy Holiday's! Joseph Art, Pharm.D. PGY-2 Ambulatory Care Pharmacy Resident

## 2022-01-26 NOTE — Telephone Encounter (Signed)
Insurance needs more information - dx codes - need form from pharmacy

## 2022-01-26 NOTE — Telephone Encounter (Signed)
S/w pharmacist - need to send CMN form RX sent to pharm - blood glucose meter and strips  Once find out what strips are covered, pharm will write for matching meter and dispense

## 2022-01-26 NOTE — Telephone Encounter (Signed)
Pt called stating her insurance will not cover her diabetic strips and meter. Pt would like to be called

## 2022-02-01 NOTE — Telephone Encounter (Signed)
CMN form faxed to pharmacy

## 2022-02-16 ENCOUNTER — Other Ambulatory Visit: Payer: Medicare Other | Admitting: Pharmacist

## 2022-02-16 NOTE — Patient Instructions (Addendum)
[name],  It was nice talking to you today!   I have requested that [facility] re-fax the medical necessity form to the office.   I will call you on next Friday to make sure you were able to get your supplies.   Take care! Joseph Art, PharmD

## 2022-02-16 NOTE — Progress Notes (Signed)
02/16/2022 Name: Ann Collins MRN: [id number] DOB: 06/07/1945  Chief Complaint  Patient presents with   Medication Management    Diabetes    Ann Collins is a 77 y.o. year old female who presented for a telephone visit.   They were referred to the pharmacist by their PCP for assistance in managing diabetes.   Subjective:  Care Team: Primary Care Provider: Einar Pheasant, MD ; Next Scheduled Visit: 03/11/2022  Medication Access/Adherence  Current Pharmacy:  [facility] DRUG STORE [place], [place] - [place] AT [facility] [place] [address] Phone: [phone] Fax: [place], [place] [address] [place] [address] Phone: [phone] Fax: [phone]   Patient reports affordability concerns with their medications: No  Patient reports access/transportation concerns to their pharmacy: No  Patient reports adherence concerns with their medications:  No    Diabetes:  Current medications: Ozempic 2.94 mg + 10 clicks, Farxiga 10 mg daily  Medications tried in the past: Ozempic 0.5 mg weekly (decreased to 0.25 mg d/t weight loss), metformin (GI stomach upset on higher doses, does ok on lower doses), Tresiba (started when A1c was 11%), glipizide, Januvia  Current glucose readings: patient was not able to pick up testing supplies that was sent at our last visit on 01/26/2022  Patient denies hypoglycemic s/sx including dizziness, shakiness, sweating. Patient denies hyperglycemic symptoms including polyuria, polydipsia, polyphagia, nocturia, neuropathy, blurred vision.  Current medication access support: Ozempic and Farxiga PAP    Health Maintenance  Health Maintenance Due  Topic Date Due   OPHTHALMOLOGY EXAM  06/22/2021   COVID-19 Vaccine (6 - 2023-24 season) 10/08/2021     Objective: Lab Results  Component Value Date   HGBA1C 7.9 (H) 12/08/2021    Lab Results  Component Value  Date   CREATININE 1.24 (H) 12/08/2021   BUN 30 (H) 12/08/2021   NA 140 12/08/2021   K 4.6 12/08/2021   CL 106 12/08/2021   CO2 28 12/08/2021    Lab Results  Component Value Date   CHOL 142 12/08/2021   HDL 49.00 12/08/2021   LDLCALC 49 08/04/2021   LDLDIRECT 64.0 12/08/2021   TRIG 215.0 (H) 12/08/2021   CHOLHDL 3 12/08/2021    Medications Reviewed Today     Reviewed by Pauletta Browns, [facility] (Pharmacist) on 02/16/22 at [phone]  Med List Status: <None>   Medication Order Taking? Sig Documenting Provider Last Dose Status Informant  aspirin 81 MG tablet [id number] No Take 81 mg by mouth daily. [provider] Taking Active   atorvastatin (LIPITOR) 20 MG tablet [id number] No TAKE 1 TABLET(20 MG) BY MOUTH DAILY Wellington Hampshire, MD Taking Active   blood glucose meter kit and supplies [id number]  Dispense based on patient and insurance preference. Use up to four times daily as directed. (FOR ICD-10 E10.9, E11.9). Einar Pheasant, MD  Active   carvedilol (COREG) 3.125 MG tablet [id number] No TAKE 1 TABLET(3.125 MG) BY MOUTH TWICE DAILY Wellington Hampshire, MD Taking Active   dapagliflozin propanediol (FARXIGA) 10 MG TABS tablet [id number] No Take 1 tablet (10 mg total) by mouth in the morning. Einar Pheasant, MD Taking Active   glucose blood test strip [id number]  Use as instructed to blood sugar twice daily Einar Pheasant, MD  Active   levothyroxine (SYNTHROID) 112 MCG tablet [id number] No TAKE 1 TABLET(112 MCG) BY MOUTH DAILY BEFORE AND BREAKFAST [place], [place],  MD Taking Active   lisinopril (ZESTRIL) 5 MG tablet [id number] No Take 1 tablet (5 mg total) by mouth daily. Einar Pheasant, MD Taking Active   pantoprazole (PROTONIX) 40 MG tablet [id number] No Take 1 tablet (40 mg total) by mouth daily as needed. Einar Pheasant, MD Taking Active   Semaglutide,0.25 or 0.'5MG'$ /DOS, (OZEMPIC, 0.25 OR 0.5 MG/DOSE,) 2 MG/3ML SOPN [id number]  Inject 7.79 mg + 10 clicks weekly Einar Pheasant, MD  Active    triamcinolone (KENALOG) 0.1 % [id number] No Apply 1 application topically 2 (two) times daily. Einar Pheasant, MD Taking Active   vitamin B-12 (CYANOCOBALAMIN) 1000 MCG tablet [id number] No Take 1,000 mcg by mouth daily. [provider] Taking Active               Assessment/Plan:   Diabetes: - Currently uncontrolled based on A1c, but close to goal (7.9%) - Reviewed long term cardiovascular and renal outcomes of uncontrolled blood sugar -Called [facility] to determine why patient is not able to pick up testing supplies. [facility] states they did not receive the CMN that was faxed on 02/01/2022. Requested CMN be refaxed. Messaged CMA to look out for CMN that is being re-faxed.  - Recommend to continue current regimen. Patient is very adamant she does not want to lose weight. If next A1c not at goal, could consider increasing Ozempic by 1 click each week until at goal A1c.   - Recommend to check glucose daily. - Farxiga and Ozempic PAP applications have been completed and faxed on 01/16/2022.    Follow Up Plan:  Pharmacist via telephone on 02/25/2021 PCP visit: 03/15/2022   Joseph Art, Pharm.D. PGY-2 Ambulatory Care Pharmacy Resident 02/16/2022 10:10 AM

## 2022-02-18 ENCOUNTER — Telehealth: Payer: Self-pay | Admitting: Pharmacy Technician

## 2022-02-18 DIAGNOSIS — Z596 Low income: Secondary | ICD-10-CM

## 2022-02-18 NOTE — Progress Notes (Signed)
[facility])                                            [place] Team    02/18/2022  Ann Collins 1946/02/04 [id number]  Care coordination call placed to [name] in regard to [place] application.  Spoke to [name] who informs patient is APPROVED 02/09/22-02/07/23. Medication will auto fill and ship based on last fill date in 2023 to the provider's office.  Shaunita Seney P. [place], [place]  [phone]

## 2022-02-22 ENCOUNTER — Telehealth: Payer: Self-pay | Admitting: Pharmacy Technician

## 2022-02-22 DIAGNOSIS — Z596 Low income: Secondary | ICD-10-CM

## 2022-02-22 DIAGNOSIS — Z5986 Financial insecurity: Secondary | ICD-10-CM

## 2022-02-22 NOTE — Progress Notes (Signed)
[facility])                                            [place] Team    02/22/2022  Ann Collins 10-08-1945 [id number]  Care coordination call placed to [place] in regard to [facility] application.  Spoke to [place] who informs patient is APPROVED 02/07/22-02/07/23. She informs medication will auto fill based and last fill dates in 2023 with delivery to the patient's home address.  Docia Klar P. [place], [place]  [phone]

## 2022-02-28 ENCOUNTER — Telehealth: Payer: Self-pay

## 2022-02-28 NOTE — Telephone Encounter (Signed)
Patient Assistance: 5Ozempic received and placed in refrigerator for pick up. Patient is aware.

## 2022-03-01 ENCOUNTER — Other Ambulatory Visit: Payer: Self-pay

## 2022-03-01 ENCOUNTER — Telehealth: Payer: Self-pay | Admitting: Internal Medicine

## 2022-03-01 DIAGNOSIS — E1165 Type 2 diabetes mellitus with hyperglycemia: Secondary | ICD-10-CM

## 2022-03-01 MED ORDER — GLUCOSE BLOOD VI STRP: ORAL_STRIP | 12 refills | Status: DC

## 2022-03-01 NOTE — Telephone Encounter (Signed)
Patient needs a refill on her Accu Check meter. Pharmacy states there needs to be a prescription for test stripes.

## 2022-03-01 NOTE — Telephone Encounter (Signed)
Rx sent in for test strips

## 2022-03-02 NOTE — Telephone Encounter (Signed)
Medication has been picked up.  

## 2022-03-07 ENCOUNTER — Telehealth: Payer: Self-pay

## 2022-03-07 NOTE — Progress Notes (Deleted)
Contacted [facility] to try and determine why patient is still unable to pick up glucometer and testing supplies.   Per [facility], they have not received the CMN that was refaxed on 02/16/2022.   [facility] provided their direct fax # [phone]) and request the CMN be faxed there instead of corporate so they can try and resolve this issue.   Will reach out to PCP about getting CMN

## 2022-03-07 NOTE — Telephone Encounter (Signed)
Do you have a copy of this form?  Let me know if there is something I need to do.

## 2022-03-07 NOTE — Progress Notes (Signed)
Contacted [facility] to try and determine why patient is still unable to pick up glucometer and testing supplies.   Per [facility], they have not received the CMN that was refaxed on 02/16/2022.   [facility] provided their direct fax # [phone]) and request the CMN be faxed there instead of corporate so they can try and resolve this issue.   Will reach out to PCP and CMA about getting CMN faxed to [facility] (fax # [phone]).   Joseph Art, Pharm.D. PGY-2 Ambulatory Care Pharmacy Resident 03/07/2022 1:17 PM

## 2022-03-09 ENCOUNTER — Telehealth: Payer: Self-pay | Admitting: Internal Medicine

## 2022-03-11 ENCOUNTER — Other Ambulatory Visit (INDEPENDENT_AMBULATORY_CARE_PROVIDER_SITE_OTHER): Payer: Medicare Other

## 2022-03-11 DIAGNOSIS — E1165 Type 2 diabetes mellitus with hyperglycemia: Secondary | ICD-10-CM

## 2022-03-11 DIAGNOSIS — E7800 Pure hypercholesterolemia, unspecified: Secondary | ICD-10-CM | POA: Diagnosis not present

## 2022-03-11 DIAGNOSIS — D696 Thrombocytopenia, unspecified: Secondary | ICD-10-CM

## 2022-03-11 LAB — CBC WITH DIFFERENTIAL/PLATELET
Basophils Absolute: 0.1 10*3/uL (ref 0.0–0.1)
Basophils Relative: 1.2 % (ref 0.0–3.0)
Eosinophils Absolute: 0.3 10*3/uL (ref 0.0–0.7)
Eosinophils Relative: 4.3 % (ref 0.0–5.0)
HCT: 40.7 % (ref 36.0–46.0)
Hemoglobin: 13.4 g/dL (ref 12.0–15.0)
Lymphocytes Relative: 52.8 % — ABNORMAL HIGH (ref 12.0–46.0)
Lymphs Abs: 3.9 10*3/uL (ref 0.7–4.0)
MCHC: 32.9 g/dL (ref 30.0–36.0)
MCV: 88.2 fl (ref 78.0–100.0)
Monocytes Absolute: 0.5 10*3/uL (ref 0.1–1.0)
Monocytes Relative: 6.1 % (ref 3.0–12.0)
Neutro Abs: 2.6 10*3/uL (ref 1.4–7.7)
Neutrophils Relative %: 35.6 % — ABNORMAL LOW (ref 43.0–77.0)
Platelets: 126 10*3/uL — ABNORMAL LOW (ref 150.0–400.0)
RBC: 4.61 Mil/uL (ref 3.87–5.11)
RDW: 16.1 % — ABNORMAL HIGH (ref 11.5–15.5)
WBC: 7.4 10*3/uL (ref 4.0–10.5)

## 2022-03-11 LAB — LIPID PANEL
Cholesterol: 119 mg/dL (ref 0–200)
HDL: 38.2 mg/dL — ABNORMAL LOW (ref >39.00)
LDL Cholesterol: 46 mg/dL (ref 0–99)
NonHDL: 80.62
Total CHOL/HDL Ratio: 3
Triglycerides: 171 mg/dL — ABNORMAL HIGH (ref 0.0–149.0)
VLDL: 34.2 mg/dL (ref 0.0–40.0)

## 2022-03-11 LAB — BASIC METABOLIC PANEL
BUN: 16 mg/dL (ref 6–23)
CO2: 26 mEq/L (ref 19–32)
Calcium: 9.4 mg/dL (ref 8.4–10.5)
Chloride: 107 mEq/L (ref 96–112)
Creatinine, Ser: 0.94 mg/dL (ref 0.40–1.20)
GFR: 59.02 mL/min — ABNORMAL LOW (ref >60.00)
Glucose, Bld: 145 mg/dL — ABNORMAL HIGH (ref 70–99)
Potassium: 4.1 mEq/L (ref 3.5–5.1)
Sodium: 140 mEq/L (ref 135–145)

## 2022-03-11 LAB — HEPATIC FUNCTION PANEL
ALT: 12 U/L (ref 0–35)
AST: 12 U/L (ref 0–37)
Albumin: 3.9 g/dL (ref 3.5–5.2)
Alkaline Phosphatase: 84 U/L (ref 39–117)
Bilirubin, Direct: 0.1 mg/dL (ref 0.0–0.3)
Total Bilirubin: 0.4 mg/dL (ref 0.2–1.2)
Total Protein: 6.6 g/dL (ref 6.0–8.3)

## 2022-03-11 LAB — HEMOGLOBIN A1C: Hgb A1c MFr Bld: 7.4 % — ABNORMAL HIGH (ref 4.6–6.5)

## 2022-03-15 ENCOUNTER — Encounter: Payer: Self-pay | Admitting: Internal Medicine

## 2022-03-15 ENCOUNTER — Ambulatory Visit (INDEPENDENT_AMBULATORY_CARE_PROVIDER_SITE_OTHER): Payer: Medicare Other | Admitting: Internal Medicine

## 2022-03-15 VITALS — BP 124/70 | HR 73 | Temp 97.9°F | Resp 16 | Ht 62.0 in | Wt 133.2 lb

## 2022-03-15 DIAGNOSIS — E1165 Type 2 diabetes mellitus with hyperglycemia: Secondary | ICD-10-CM

## 2022-03-15 DIAGNOSIS — R911 Solitary pulmonary nodule: Secondary | ICD-10-CM | POA: Diagnosis not present

## 2022-03-15 DIAGNOSIS — I779 Disorder of arteries and arterioles, unspecified: Secondary | ICD-10-CM | POA: Diagnosis not present

## 2022-03-15 DIAGNOSIS — E7800 Pure hypercholesterolemia, unspecified: Secondary | ICD-10-CM | POA: Diagnosis not present

## 2022-03-15 DIAGNOSIS — Z1211 Encounter for screening for malignant neoplasm of colon: Secondary | ICD-10-CM

## 2022-03-15 DIAGNOSIS — F439 Reaction to severe stress, unspecified: Secondary | ICD-10-CM

## 2022-03-15 DIAGNOSIS — E039 Hypothyroidism, unspecified: Secondary | ICD-10-CM | POA: Diagnosis not present

## 2022-03-15 DIAGNOSIS — I700 Atherosclerosis of aorta: Secondary | ICD-10-CM | POA: Diagnosis not present

## 2022-03-15 DIAGNOSIS — D696 Thrombocytopenia, unspecified: Secondary | ICD-10-CM | POA: Diagnosis not present

## 2022-03-15 DIAGNOSIS — Z87891 Personal history of nicotine dependence: Secondary | ICD-10-CM | POA: Diagnosis not present

## 2022-03-15 DIAGNOSIS — I10 Essential (primary) hypertension: Secondary | ICD-10-CM | POA: Diagnosis not present

## 2022-03-15 DIAGNOSIS — L989 Disorder of the skin and subcutaneous tissue, unspecified: Secondary | ICD-10-CM

## 2022-03-15 DIAGNOSIS — K2100 Gastro-esophageal reflux disease with esophagitis, without bleeding: Secondary | ICD-10-CM | POA: Diagnosis not present

## 2022-03-15 MED ORDER — LISINOPRIL 5 MG PO TABS: 5.0000 mg | ORAL_TABLET | Freq: Every day | ORAL | 1 refills | Status: DC

## 2022-03-15 MED ORDER — LEVOTHYROXINE SODIUM 112 MCG PO TABS: ORAL_TABLET | ORAL | 1 refills | Status: DC

## 2022-03-15 NOTE — Progress Notes (Signed)
Subjective:    Patient ID: Ann Collins, female    DOB: 1945-05-22, 77 y.o.   MRN: [id number]  Patient here for  Chief Complaint  Patient presents with   Medical Management of Chronic Issues    HPI Here to follow up regarding diabetes and hypertension.  Has been on ozempic. Recent A1c improved 7.4.  she plans to watch more her diet.  States she ate a little different through the holidays.  Going to the gym 3 days per week. No chest pain or sob reported.  No abdominal pain.  Some minimal constipation, but reports not a significant issue.  She does have a lesion - left forearm.  Getting bigger.  Discussed dermatology evaluation.     Past Medical History:  Diagnosis Date   Abdominal hernia    CAD (coronary artery disease)    a. LHC 7/19: LM nl, p-mLAD 100% w/ right to left collaterals, OM2 90% (2 mm), 1st LPL 40%, medical management   Diabetes mellitus ([place])    Diverticulosis    GERD (gastroesophageal reflux disease)    Hematuria    Hypercholesterolemia    Hypertension    Hypothyroidism    Kidney stones    Past Surgical History:  Procedure Laterality Date   APPENDECTOMY     BREAST BIOPSY  1969   right   CHOLECYSTECTOMY  2005   COLONOSCOPY     COLONOSCOPY WITH PROPOFOL N/A 09/19/2017   Procedure: COLONOSCOPY WITH PROPOFOL;  Surgeon: Toledo, Benay Pike, MD;  Location: [facility];  Service: Gastroenterology;  Laterality: N/A;   ESOPHAGOGASTRODUODENOSCOPY (EGD) WITH PROPOFOL N/A 09/19/2017   Procedure: ESOPHAGOGASTRODUODENOSCOPY (EGD) WITH PROPOFOL;  Surgeon: Toledo, Benay Pike, MD;  Location: [facility];  Service: Gastroenterology;  Laterality: N/A;   LEFT HEART CATH AND CORONARY ANGIOGRAPHY Left 08/07/2017   Procedure: LEFT HEART CATH AND CORONARY ANGIOGRAPHY;  Surgeon: Wellington Hampshire, MD;  Location: [facility];  Service: Cardiovascular;  Laterality: Left;   UPPER GI ENDOSCOPY     Family History  Problem Relation Age of Onset   Brain cancer Mother         history of brain tumor   Lung disease Father    Diabetes Brother        x2   Bipolar disorder Son    Alcohol abuse Son    Breast cancer Neg Hx    Colon cancer Neg Hx    Social History   Socioeconomic History   Marital status: Married    Spouse name: Not on file   Number of children: Not on file   Years of education: Not on file   Highest education level: Not on file  Occupational History   Not on file  Tobacco Use   Smoking status: Every Day    Packs/day: 1.00    Years: 50.00    Total pack years: 50.00    Types: Cigarettes   Smokeless tobacco: Never  Vaping Use   Vaping Use: Never used  Substance and Sexual Activity   Alcohol use: No    Alcohol/week: 0.0 standard drinks of alcohol   Drug use: No   Sexual activity: Not on file  Other Topics Concern   Not on file  Social History Narrative   Not on file   Social Determinants of Health   Financial Resource Strain: Medium Risk (02/19/2021)   Overall Financial Resource Strain (CARDIA)    Difficulty of Paying Living Expenses: Somewhat hard  Food Insecurity: No Food Insecurity (06/03/2021)  Hunger Vital Sign    Worried About Running Out of Food in the Last Year: Never true    Ran Out of Food in the Last Year: Never true  Transportation Needs: No Transportation Needs (06/03/2021)   PRAPARE - Hydrologist (Medical): No    Lack of Transportation (Non-Medical): No  Physical Activity: Sufficiently Active (06/03/2021)   Exercise Vital Sign    Days of Exercise per Week: 3 days    Minutes of Exercise per Session: 60 min  Stress: Stress Concern Present (09/07/2020)   [place]    Feeling of Stress : Rather much  Social Connections: Unknown (06/03/2021)   Social Connection and Isolation Panel [NHANES]    Frequency of Communication with Friends and Family: Not on file    Frequency of Social Gatherings with Friends and Family: Not on file     Attends Religious Services: Not on file    Active Member of Clubs or Organizations: Not on file    Attends Archivist Meetings: Not on file    Marital Status: Married     Review of Systems  Constitutional:  Negative for appetite change and unexpected weight change.  HENT:  Negative for congestion and sinus pressure.   Respiratory:  Negative for cough, chest tightness and shortness of breath.   Cardiovascular:  Negative for chest pain, palpitations and leg swelling.  Gastrointestinal:  Negative for abdominal pain, diarrhea, nausea and vomiting.  Genitourinary:  Negative for difficulty urinating and dysuria.  Musculoskeletal:  Negative for joint swelling and myalgias.  Skin:  Negative for color change and rash.  Neurological:  Negative for dizziness and headaches.  Psychiatric/Behavioral:  Negative for agitation and dysphoric mood.        Objective:     BP 124/70   Pulse 73   Temp 97.9 F (36.6 C)   Resp 16   Ht 5' 2"$  (1.575 m)   Wt 133 lb 3.2 oz (60.4 kg)   LMP 03/10/1991   SpO2 99%   BMI 24.36 kg/m  Wt Readings from Last 3 Encounters:  03/15/22 133 lb 3.2 oz (60.4 kg)  12/10/21 133 lb 6.4 oz (60.5 kg)  08/09/21 124 lb 9.6 oz (56.5 kg)    Physical Exam Vitals reviewed.  Constitutional:      General: She is not in acute distress.    Appearance: Normal appearance.  HENT:     Head: Normocephalic and atraumatic.     Right Ear: External ear normal.     Left Ear: External ear normal.  Eyes:     General: No scleral icterus.       Right eye: No discharge.        Left eye: No discharge.     Conjunctiva/sclera: Conjunctivae normal.  Neck:     Thyroid: No thyromegaly.  Cardiovascular:     Rate and Rhythm: Normal rate and regular rhythm.  Pulmonary:     Effort: No respiratory distress.     Breath sounds: Normal breath sounds. No wheezing.  Abdominal:     General: Bowel sounds are normal.     Palpations: Abdomen is soft.     Tenderness: There is no  abdominal tenderness.  Musculoskeletal:        General: No swelling or tenderness.     Cervical back: Neck supple. No tenderness.  Lymphadenopathy:     Cervical: No cervical adenopathy.  Skin:    Findings: No erythema  or rash.  Neurological:     Mental Status: She is alert.  Psychiatric:        Mood and Affect: Mood normal.        Behavior: Behavior normal.      Outpatient Encounter Medications as of 03/15/2022  Medication Sig   Accu-Chek Softclix Lancets lancets USE TO TEST BLOOD SUGAR FOUR TIMES DAILY   aspirin 81 MG tablet Take 81 mg by mouth daily.   atorvastatin (LIPITOR) 20 MG tablet TAKE 1 TABLET(20 MG) BY MOUTH DAILY   blood glucose meter kit and supplies Dispense based on patient and insurance preference. Use up to four times daily as directed. (FOR ICD-10 E10.9, E11.9).   carvedilol (COREG) 3.125 MG tablet TAKE 1 TABLET(3.125 MG) BY MOUTH TWICE DAILY   dapagliflozin propanediol (FARXIGA) 10 MG TABS tablet Take 1 tablet (10 mg total) by mouth in the morning.   glucose blood test strip Use as instructed to blood sugar twice daily   levothyroxine (SYNTHROID) 112 MCG tablet TAKE 1 TABLET(112 MCG) BY MOUTH DAILY BEFORE AND BREAKFAST   lisinopril (ZESTRIL) 5 MG tablet Take 1 tablet (5 mg total) by mouth daily.   pantoprazole (PROTONIX) 40 MG tablet Take 1 tablet (40 mg total) by mouth daily as needed.   Semaglutide,0.25 or 0.5MG/DOS, (OZEMPIC, 0.25 OR 0.5 MG/DOSE,) 2 MG/3ML SOPN Inject [id number] mg + 10 clicks weekly   triamcinolone (KENALOG) 0.1 % Apply 1 application topically 2 (two) times daily.   vitamin B-12 (CYANOCOBALAMIN) 1000 MCG tablet Take 1,000 mcg by mouth daily.   [DISCONTINUED] levothyroxine (SYNTHROID) 112 MCG tablet TAKE 1 TABLET(112 MCG) BY MOUTH DAILY BEFORE AND BREAKFAST   [DISCONTINUED] lisinopril (ZESTRIL) 5 MG tablet Take 1 tablet (5 mg total) by mouth daily.   No facility-administered encounter medications on file as of 03/15/2022.     Lab Results  Component  Value Date   WBC 7.4 03/11/2022   HGB 13.4 03/11/2022   HCT 40.7 03/11/2022   PLT 126.0 (L) 03/11/2022   GLUCOSE 145 (H) 03/11/2022   CHOL 119 03/11/2022   TRIG 171.0 (H) 03/11/2022   HDL 38.20 (L) 03/11/2022   LDLDIRECT 64.0 12/08/2021   LDLCALC 46 03/11/2022   ALT 12 03/11/2022   AST 12 03/11/2022   NA 140 03/11/2022   K 4.1 03/11/2022   CL 107 03/11/2022   CREATININE 0.94 03/11/2022   BUN 16 03/11/2022   CO2 26 03/11/2022   TSH 2.92 08/04/2021   INR 0.87 08/07/2017   HGBA1C 7.4 (H) 03/11/2022   MICROALBUR 0.8 08/04/2021    MM 3D SCREEN BREAST BILATERAL  Result Date: 07/06/2021 CLINICAL DATA:  Screening. EXAM: DIGITAL SCREENING BILATERAL MAMMOGRAM WITH TOMOSYNTHESIS AND CAD TECHNIQUE: Bilateral screening digital craniocaudal and mediolateral oblique mammograms were obtained. Bilateral screening digital breast tomosynthesis was performed. The images were evaluated with computer-aided detection. COMPARISON:  Previous exam(s). ACR Breast Density Category b: There are scattered areas of fibroglandular density. FINDINGS: There are no findings suspicious for malignancy. IMPRESSION: No mammographic evidence of malignancy. A result letter of this screening mammogram will be mailed directly to the patient. RECOMMENDATION: Screening mammogram in one year. (Code:SM-B-01Y) BI-RADS CATEGORY  1: Negative. Electronically Signed   By: Ammie Ferrier M.D.   On: 07/06/2021 15:41       Assessment & Plan:  Type 2 diabetes mellitus with hyperglycemia, without long-term current use of insulin ([place]) Assessment & Plan: On ozempic and farxiga.  Discussed diet and exercise.  Tolerating.  Follow sugars.  Follow  met b and A1c.  No changes today.   Lab Results  Component Value Date   HGBA1C 7.4 (H) 03/11/2022     Orders: -     Hemoglobin A1c; Future  Hypercholesterolemia Assessment & Plan: Continue lipitor.  Follow lipid panel and liver function tests.    Orders: -     Basic metabolic panel;  Future -     Lipid panel; Future -     Hepatic function panel; Future  Primary hypertension Assessment & Plan: Continue lisinopril.  Blood pressure doing well.  Follow pressures.  Follow metabolic panel.   Orders: -     CBC with Differential/Platelet; Future -     TSH; Future -     Lisinopril; Take 1 tablet (5 mg total) by mouth daily.  Dispense: 90 tablet; Refill: 1  Hypothyroidism, unspecified type Assessment & Plan: On thyroid replacement.  Follow tsh.  Orders: -     Levothyroxine Sodium; TAKE 1 TABLET(112 MCG) BY MOUTH DAILY BEFORE AND BREAKFAST  Dispense: 90 tablet; Refill: 1  Aortic atherosclerosis (HCC) Assessment & Plan: Continue lipitor.    Carotid artery disease, unspecified laterality, unspecified type [facility]) Assessment & Plan: Carotid ultrasound 06/02/21:  Right Carotid: Velocities in the right ICA are consistent with a 1-39% stenosis.                Non-hemodynamically significant plaque <50% noted in the CCA. The                ECA appears <50% stenosed.   Left Carotid: There was no evidence of thrombus, dissection, atherosclerotic               plaque or stenosis in the cervical carotid system.  Continue lipitor.    Colon cancer screening Assessment & Plan: Colonoscopy 09/2017 - internal hemorrhoids and 2 3-24m polyps.  Continue f/u with GI.    Gastroesophageal reflux disease with esophagitis without hemorrhage Assessment & Plan: No upper symptoms reported. On protonix.    Lung nodule Assessment & Plan: CT 05/11/21 - Lung-RADS 2S, benign appearance or behavior. Continue annual screening with low-dose chest CT without contrast in 12 months.   Personal history of tobacco use, presenting hazards to health Assessment & Plan: Discussed the need to quit.  Desires not to quit at this time.  Screening chest CT as outlined.    [place] Assessment & Plan: Follow cbc.    Stress Assessment & Plan: Increased stress.  Discussed.  Does not  feel needs any further intervention.  Follow.     Skin lesion Assessment & Plan: Left arm lesion.  Refer to dermatology.   Orders: -     Ambulatory referral to Dermatology     CEinar Pheasant MD

## 2022-03-26 ENCOUNTER — Encounter: Payer: Self-pay | Admitting: Internal Medicine

## 2022-03-26 NOTE — Assessment & Plan Note (Signed)
Continue lipitor.  Follow lipid panel and liver function tests.

## 2022-03-26 NOTE — Assessment & Plan Note (Signed)
Discussed the need to quit.  Desires not to quit at this time.  Screening chest CT as outlined.

## 2022-03-26 NOTE — Assessment & Plan Note (Signed)
Left arm lesion.  Refer to dermatology.

## 2022-03-26 NOTE — Assessment & Plan Note (Signed)
Increased stress.  Discussed.  Does not feel needs any further intervention.  Follow.

## 2022-03-26 NOTE — Assessment & Plan Note (Signed)
Follow cbc.  

## 2022-03-26 NOTE — Assessment & Plan Note (Signed)
No upper symptoms reported.  On protonix.   

## 2022-03-26 NOTE — Assessment & Plan Note (Signed)
Colonoscopy 09/2017 - internal hemorrhoids and 2 3-87m polyps.  Continue f/u with GI.

## 2022-03-26 NOTE — Assessment & Plan Note (Signed)
Continue lisinopril.  Blood pressure doing well.  Follow pressures.  Follow metabolic panel.  

## 2022-03-26 NOTE — Assessment & Plan Note (Signed)
Continue lipitor  ?

## 2022-03-26 NOTE — Assessment & Plan Note (Signed)
On ozempic and farxiga.  Discussed diet and exercise.  Tolerating.  Follow sugars.  Follow met b and A1c.  No changes today.   Lab Results  Component Value Date   HGBA1C 7.4 (H) 03/11/2022

## 2022-03-26 NOTE — Assessment & Plan Note (Signed)
CT 05/11/21 - Lung-RADS 2S, benign appearance or behavior. Continue annual screening with low-dose chest CT without contrast in 12 months.

## 2022-03-26 NOTE — Assessment & Plan Note (Signed)
Carotid ultrasound 06/02/21:  Right Carotid: Velocities in the right ICA are consistent with a 1-39% stenosis.                Non-hemodynamically significant plaque <50% noted in the CCA. The                ECA appears <50% stenosed.   Left Carotid: There was no evidence of thrombus, dissection, atherosclerotic               plaque or stenosis in the cervical carotid system.  Continue lipitor.

## 2022-03-26 NOTE — Assessment & Plan Note (Signed)
On thyroid replacement.  Follow tsh.  

## 2022-04-27 ENCOUNTER — Other Ambulatory Visit: Payer: Self-pay | Admitting: Cardiovascular Disease

## 2022-05-13 ENCOUNTER — Ambulatory Visit: Payer: Medicare Other

## 2022-05-31 ENCOUNTER — Telehealth: Payer: Self-pay | Admitting: Internal Medicine

## 2022-05-31 NOTE — Telephone Encounter (Signed)
Contacted Brinlynn C Underwood to schedule their annual wellness visit. Appointment made for 06/06/2022.  Thank you,  [facility] Support [facility] Direct dial  [phone]

## 2022-06-06 ENCOUNTER — Ambulatory Visit (INDEPENDENT_AMBULATORY_CARE_PROVIDER_SITE_OTHER): Payer: Medicare Other

## 2022-06-06 VITALS — Ht 62.0 in | Wt 133.0 lb

## 2022-06-06 DIAGNOSIS — Z1231 Encounter for screening mammogram for malignant neoplasm of breast: Secondary | ICD-10-CM | POA: Diagnosis not present

## 2022-06-06 DIAGNOSIS — Z0000 Encounter for general adult medical examination without abnormal findings: Secondary | ICD-10-CM

## 2022-06-06 NOTE — Progress Notes (Signed)
Subjective:   Ann Collins is a 77 y.o. female who presents for Medicare Annual (Subsequent) preventive examination.  Review of Systems    No ROS.  Medicare Wellness Virtual Visit.  Visual/audio telehealth visit, UTA vital signs.   See social history for additional risk factors.   Cardiac Risk Factors include: advanced age (>42men, >79 women);diabetes mellitus;hypertension     Objective:    Today's Vitals   06/06/22 0808  Weight: 133 lb (60.3 kg)  Height: 5\' 2"  (1.575 m)   Body mass index is 24.33 kg/m.     06/06/2022    8:19 AM 06/03/2021    4:11 PM 06/02/2020   12:41 PM 05/31/2019   11:47 AM 05/30/2018   11:26 AM 09/19/2017   12:12 PM 08/07/2017    8:42 AM  Advanced Directives  Does Patient Have a Medical Advance Directive? No No No No Yes No No  Type of Agricultural consultant;Living will    Does patient want to make changes to medical advance directive?     No - Patient declined    Copy of Healthcare Power of Attorney in Chart?     No - copy requested    Would patient like information on creating a medical advance directive? No - Patient declined No - Patient declined No - Patient declined Yes (MAU/Ambulatory/Procedural Areas - Information given)   No - Patient declined    Current Medications (verified) Outpatient Encounter Medications as of 06/06/2022  Medication Sig   Accu-Chek Softclix Lancets lancets USE TO TEST BLOOD SUGAR FOUR TIMES DAILY   aspirin 81 MG tablet Take 81 mg by mouth daily.   atorvastatin (LIPITOR) 20 MG tablet TAKE 1 TABLET(20 MG) BY MOUTH DAILY   blood glucose meter kit and supplies Dispense based on patient and insurance preference. Use up to four times daily as directed. (FOR ICD-10 E10.9, E11.9).   carvedilol (COREG) 3.125 MG tablet TAKE 1 TABLET(3.125 MG) BY MOUTH TWICE DAILY   dapagliflozin propanediol (FARXIGA) 10 MG TABS tablet Take 1 tablet (10 mg total) by mouth in the morning.   glucose blood test strip Use as  instructed to blood sugar twice daily   levothyroxine (SYNTHROID) 112 MCG tablet TAKE 1 TABLET(112 MCG) BY MOUTH DAILY BEFORE AND BREAKFAST   lisinopril (ZESTRIL) 5 MG tablet Take 1 tablet (5 mg total) by mouth daily.   pantoprazole (PROTONIX) 40 MG tablet Take 1 tablet (40 mg total) by mouth daily as needed.   Semaglutide,0.25 or 0.5MG /DOS, (OZEMPIC, 0.25 OR 0.5 MG/DOSE,) 2 MG/3ML SOPN Inject 0.25 mg + 10 clicks weekly   triamcinolone (KENALOG) 0.1 % Apply 1 application topically 2 (two) times daily.   vitamin B-12 (CYANOCOBALAMIN) 1000 MCG tablet Take 1,000 mcg by mouth daily.   No facility-administered encounter medications on file as of 06/06/2022.    Allergies (verified) Pioglitazone, Macrobid [nitrofurantoin macrocrystal], and Nitrofurantoin   History: Past Medical History:  Diagnosis Date   Abdominal hernia    CAD (coronary artery disease)    a. LHC 7/19: LM nl, p-mLAD 100% w/ right to left collaterals, OM2 90% (2 mm), 1st LPL 40%, medical management   Diabetes mellitus (HCC)    Diverticulosis    GERD (gastroesophageal reflux disease)    Hematuria    Hypercholesterolemia    Hypertension    Hypothyroidism    Kidney stones    Past Surgical History:  Procedure Laterality Date   APPENDECTOMY     BREAST BIOPSY  1969   right   CHOLECYSTECTOMY  2005   COLONOSCOPY     COLONOSCOPY WITH PROPOFOL N/A 09/19/2017   Procedure: COLONOSCOPY WITH PROPOFOL;  Surgeon: Toledo, Boykin Nearing, MD;  Location: [facility] ENDOSCOPY;  Service: Gastroenterology;  Laterality: N/A;   ESOPHAGOGASTRODUODENOSCOPY (EGD) WITH PROPOFOL N/A 09/19/2017   Procedure: ESOPHAGOGASTRODUODENOSCOPY (EGD) WITH PROPOFOL;  Surgeon: Toledo, Boykin Nearing, MD;  Location: [facility] ENDOSCOPY;  Service: Gastroenterology;  Laterality: N/A;   LEFT HEART CATH AND CORONARY ANGIOGRAPHY Left 08/07/2017   Procedure: LEFT HEART CATH AND CORONARY ANGIOGRAPHY;  Surgeon: Iran Ouch, MD;  Location: [facility]IVE [facility];  Service: Cardiovascular;   Laterality: Left;   UPPER GI ENDOSCOPY     Family History  Problem Relation Age of Onset   Brain cancer Mother        history of brain tumor   Lung disease Father    Diabetes Brother        x2   Bipolar disorder Son    Alcohol abuse Son    Breast cancer Neg Hx    Colon cancer Neg Hx    Social History   Socioeconomic History   Marital status: Married    Spouse name: Not on file   Number of children: Not on file   Years of education: Not on file   Highest education level: Not on file  Occupational History   Not on file  Tobacco Use   Smoking status: Every Day    Packs/day: 1.00    Years: 50.00    Additional pack years: 0.00    Total pack years: 50.00    Types: Cigarettes   Smokeless tobacco: Never  Vaping Use   Vaping Use: Never used  Substance and Sexual Activity   Alcohol use: No    Alcohol/week: 0.0 standard drinks of alcohol   Drug use: No   Sexual activity: Not on file  Other Topics Concern   Not on file  Social History Narrative   Not on file   Social Determinants of Health   Financial Resource Strain: Low Risk  (05/31/2022)   Overall Financial Resource Strain (CARDIA)    Difficulty of Paying Living Expenses: Not very hard  Food Insecurity: No Food Insecurity (05/31/2022)   Hunger Vital Sign    Worried About Running Out of Food in the Last Year: Never true    Ran Out of Food in the Last Year: Never true  Transportation Needs: No Transportation Needs (05/31/2022)   PRAPARE - Administrator, Civil Service (Medical): No    Lack of Transportation (Non-Medical): No  Physical Activity: Sufficiently Active (05/31/2022)   Exercise Vital Sign    Days of Exercise per Week: 3 days    Minutes of Exercise per Session: 60 min  Stress: No Stress Concern Present (05/31/2022)   [employer] of Occupational Health - Occupational Stress Questionnaire    Feeling of Stress : Only a little  Social Connections: Unknown (05/31/2022)   Social Connection and  Isolation Panel [NHANES]    Frequency of Communication with Friends and Family: More than three times a week    Frequency of Social Gatherings with Friends and Family: Once a week    Attends Religious Services: Not on Marketing executive or Organizations: Yes    Attends Banker Meetings: More than 4 times per year    Marital Status: Married    Tobacco Counseling Ready to quit: Not Answered Counseling given: Not Answered  Clinical Intake:  Pre-visit preparation completed: Yes        Diabetes: Yes (Followed by PCP)  How often do you need to have someone help you when you read instructions, pamphlets, or other written materials from your doctor or pharmacy?: 1 - Never  Nutrition Risk Assessment: Has the patient had any N/V/D within the last 2 months?  No  Does the patient have any non-healing wounds?  Yes , appointment on 06/16/22 with dermatology for wound on L arm. Overall the wound is doing better. Followed by pcp.  Has the patient had any unintentional weight loss or weight gain?  No   Diabetes: Is the patient diabetic?  Yes  If diabetic, was a CBG obtained today?  Yes , [id number] Did the patient bring in their glucometer from home?  No  How often do you monitor your CBG's? Daily.   Financial Strains and Diabetes Management: Are you having any financial strains with the device, your supplies or your medication? No .  Does the patient want to be seen by Chronic Care Management for management of their diabetes?  No  Would the patient like to be referred to a Nutritionist or for Diabetic Management?  No     Interpreter Needed?: No      Activities of Daily Living    06/06/2022    8:20 AM 06/06/2022    8:08 AM  In your present state of health, do you have any difficulty performing the following activities:  Hearing? 0 0  Vision? 0 0  Difficulty concentrating or making decisions? 0 0  Walking or climbing stairs? 0 0  Dressing or bathing? 0 0   Doing errands, shopping? 0 0  Preparing Food and eating ? N N  Using the Toilet? N N  In the past six months, have you accidently leaked urine? N N  Do you have problems with loss of bowel control? N N  Managing your Medications? N N  Managing your Finances? N N  Housekeeping or managing your Housekeeping? N N    Patient Care Team: Dale [place], MD as PCP - General (Internal Medicine) Iran Ouch, MD as PCP - Cardiology (Cardiology) Alden Hipp, RPH-CPP (Pharmacist)  Indicate any recent Medical Services you may have received from other than [facility] providers in the past year (date may be approximate).     Assessment:   This is a routine wellness examination for Ann Collins.  Patient Medicare AWV questionnaire was completed by the patient on 05/31/22, I have confirmed that all information answered by patient is correct and no changes since this date.   I connected with  ARMENTA ERSKIN on 06/06/22 by a audio enabled telemedicine application and verified that I am speaking with the correct person using two identifiers.  Patient Location: Home  Provider Location: Office/Clinic  I discussed the limitations of evaluation and management by telemedicine. The patient expressed understanding and agreed to proceed.   Hearing/Vision screen Hearing Screening - Comments:: Patient is able to hear conversational tones without difficulty. No issues reported. Vision Screening - Comments:: Followed by Dr. Larence Penning  Cataract extraction, bilateral  They have regular follow up with the ophthalmologist    Dietary issues and exercise activities discussed: Current Exercise Habits: Home exercise routine, Type of exercise: walking;strength training/weights;stretching, Time (Minutes): 60, Frequency (Times/Week): 3, Weekly Exercise (Minutes/Week): 180, Intensity: Mild Healthy diet Good water intake   Goals Addressed               This  Visit's Progress     Patient Stated     Maintain  Healthy Lifestyle (pt-stated)        Stay active Stay hydrated  Healthy diet Do not lose any weight       Depression Screen    06/06/2022    8:18 AM 12/10/2021   10:08 AM 06/03/2021    4:08 PM 06/02/2020   12:52 PM 06/02/2020    8:02 AM 05/31/2019   11:47 AM 05/30/2018   11:27 AM  PHQ 2/9 Scores  PHQ - 2 Score 0 0 0 0 0 1 0    Fall Risk    05/31/2022    9:21 PM 12/10/2021   10:08 AM 06/03/2021    4:13 PM 12/15/2020    7:41 AM 06/02/2020   12:52 PM  Fall Risk   Falls in the past year? 0 1 0 0 0  Number falls in past yr: 0 0 0 0 0  Injury with Fall? 0 0  0 0  Risk for fall due to :  No Fall Risks  No Fall Risks   Follow up Falls evaluation completed;Falls prevention discussed Falls evaluation completed Falls evaluation completed Falls evaluation completed Falls evaluation completed    FALL RISK PREVENTION PERTAINING TO THE HOME: Home free of loose throw rugs in walkways, pet beds, electrical cords, etc? Yes  Adequate lighting in your home to reduce risk of falls? Yes   ASSISTIVE DEVICES UTILIZED TO PREVENT FALLS: Life alert? No  Use of a cane, walker or w/c? No  Grab bars in the bathroom? No  Shower chair or bench in shower? No  Elevated toilet seat or a handicapped toilet? No   TIMED UP AND GO: Was the test performed? No .   Cognitive Function:        06/06/2022    8:21 AM 05/31/2019   11:54 AM 05/30/2018   11:28 AM 05/12/2017    8:41 AM  6CIT Screen  What Year? 0 points 0 points 0 points 0 points  What month? 0 points 0 points 0 points 0 points  What time? 0 points 0 points 0 points 0 points  Count back from 20 0 points  0 points 0 points  Months in reverse 0 points 0 points 0 points 0 points  Repeat phrase 0 points   0 points  Total Score 0 points   0 points    Immunizations Immunization History  Administered Date(s) Administered   Fluad Quad(high Dose 65+) 11/11/2019   Influenza Split 10/16/2013   Influenza Whole 11/24/2016   Influenza, High Dose Seasonal  PF 11/05/2017, 10/18/2018, 11/21/2020, 10/25/2021   Influenza,inj,quad, With Preservative 11/07/2017   Influenza-Unspecified 11/07/2012, 10/27/2014   Moderna SARS-COV2 Booster Vaccination 11/21/2020   Moderna Sars-Covid-2 Vaccination 03/09/2019, 04/07/2019, 01/30/2020, 07/29/2020   Pneumococcal Conjugate-13 03/02/2016   Pneumococcal Polysaccharide-23 12/08/2012, 03/02/2017   Tdap 10/14/2013   Zoster Recombinat (Shingrix) 10/25/2021, 01/04/2022   Screening Tests Health Maintenance  Topic Date Due   OPHTHALMOLOGY EXAM  06/22/2021   COVID-19 Vaccine (6 - 2023-24 season) 06/22/2022 (Originally 10/08/2021)   Lung Cancer Screening  07/09/2022 (Originally 05/12/2022)   MAMMOGRAM  07/07/2022   Diabetic kidney evaluation - Urine ACR  08/05/2022   INFLUENZA VACCINE  09/08/2022   HEMOGLOBIN A1C  09/09/2022   FOOT EXAM  12/11/2022   Diabetic kidney evaluation - eGFR measurement  03/12/2023   Medicare Annual Wellness (AWV)  06/06/2023   DTaP/Tdap/Td (2 - Td or Tdap) 10/15/2023   Pneumonia Vaccine 65+  Years old  Completed   DEXA SCAN  Completed   Hepatitis C Screening  Completed   Zoster Vaccines- Shingrix  Completed   HPV VACCINES  Aged Out   COLONOSCOPY (Pts 45-70yrs Insurance coverage will need to be confirmed)  Discontinued   Fecal DNA (Cologuard)  Discontinued    Health Maintenance Health Maintenance Due  Topic Date Due   OPHTHALMOLOGY EXAM  06/22/2021   Mammogram- ordered- number to schedule exam [phone].  Lung Cancer Screening: Annual visits. Notes she received a call from the organization stating she has aged out the program.   Hepatitis C Screening: Completed 05/2017.  Vision Screening: Recommended annual ophthalmology exams for early detection of glaucoma and other disorders of the eye. Agrees to schedule eye exam.  Dental Screening: Recommended annual dental exams for proper oral hygiene  Community Resource Referral / Chronic Care Management: CRR required this visit?  No    CCM required this visit?  No      Plan:     I have personally reviewed and noted the following in the patient's chart:   Medical and social history Use of alcohol, tobacco or illicit drugs  Current medications and supplements including opioid prescriptions. Patient is not currently taking opioid prescriptions. Functional ability and status Nutritional status Physical activity Advanced directives List of other physicians Hospitalizations, surgeries, and ER visits in previous 12 months Vitals Screenings to include cognitive, depression, and falls Referrals and appointments  In addition, I have reviewed and discussed with patient certain preventive protocols, quality metrics, and best practice recommendations. A written personalized care plan for preventive services as well as general preventive health recommendations were provided to patient.     Cathey Endow, LPN   04/30/4008

## 2022-06-06 NOTE — Patient Instructions (Addendum)
Ann Collins , Thank you for taking time to come for your Medicare Wellness Visit. I appreciate your ongoing commitment to your health goals. Please review the following plan we discussed and let me know if I can assist you in the future.   These are the goals we discussed:  Goals       Patient Stated     Maintain Healthy Lifestyle (pt-stated)      Stay active Stay hydrated  Healthy diet Do not lose any weight        This is a list of the screening recommended for you and due dates:  Health Maintenance  Topic Date Due   Eye exam for diabetics  06/22/2021   COVID-19 Vaccine (6 - 2023-24 season) 06/22/2022*   Screening for Lung Cancer  07/09/2022*   Mammogram  07/07/2022   Yearly kidney health urinalysis for diabetes  08/05/2022   Flu Shot  09/08/2022   Hemoglobin A1C  09/09/2022   Complete foot exam   12/11/2022   Yearly kidney function blood test for diabetes  03/12/2023   Medicare Annual Wellness Visit  06/06/2023   DTaP/Tdap/Td vaccine (2 - Td or Tdap) 10/15/2023   Pneumonia Vaccine  Completed   DEXA scan (bone density measurement)  Completed   Hepatitis C Screening: USPSTF Recommendation to screen - Ages 61-79 yo.  Completed   Zoster (Shingles) Vaccine  Completed   HPV Vaccine  Aged Out   Colon Cancer Screening  Discontinued   Cologuard (Stool DNA test)  Discontinued  *Topic was postponed. The date shown is not the original due date.   Mammogram- ordered- number to schedule exam [id number].  Advanced directives: not yet completed  Conditions/risks identified: none new  Next appointment: Follow up in one year for your annual wellness visit    Preventive Care 65 Years and Older, Female Preventive care refers to lifestyle choices and visits with your health care provider that can promote health and wellness. What does preventive care include? A yearly physical exam. This is also called an annual well check. Dental exams once or twice a year. Routine eye exams.  Ask your health care provider how often you should have your eyes checked. Personal lifestyle choices, including: Daily care of your teeth and gums. Regular physical activity. Eating a healthy diet. Avoiding tobacco and drug use. Limiting alcohol use. Practicing safe sex. Taking low-dose aspirin every day. Taking vitamin and mineral supplements as recommended by your health care provider. What happens during an annual well check? The services and screenings done by your health care provider during your annual well check will depend on your age, overall health, lifestyle risk factors, and family history of disease. Counseling  Your health care provider may ask you questions about your: Alcohol use. Tobacco use. Drug use. Emotional well-being. Home and relationship well-being. Sexual activity. Eating habits. History of falls. Memory and ability to understand (cognition). Work and work Astronomer. Reproductive health. Screening  You may have the following tests or measurements: Height, weight, and BMI. Blood pressure. Lipid and cholesterol levels. These may be checked every 5 years, or more frequently if you are over 28 years old. Skin check. Lung cancer screening. You may have this screening every year starting at age 33 if you have a 30-pack-year history of smoking and currently smoke or have quit within the past 15 years. Fecal occult blood test (FOBT) of the stool. You may have this test every year starting at age 37. Flexible sigmoidoscopy or colonoscopy. You may  have a sigmoidoscopy every 5 years or a colonoscopy every 10 years starting at age 82. Hepatitis C blood test. Hepatitis B blood test. Sexually transmitted disease (STD) testing. Diabetes screening. This is done by checking your blood sugar (glucose) after you have not eaten for a while (fasting). You may have this done every 1-3 years. Bone density scan. This is done to screen for osteoporosis. You may have this  done starting at age 19. Mammogram. This may be done every 1-2 years. Talk to your health care provider about how often you should have regular mammograms. Talk with your health care provider about your test results, treatment options, and if necessary, the need for more tests. Vaccines  Your health care provider may recommend certain vaccines, such as: Influenza vaccine. This is recommended every year. Tetanus, diphtheria, and acellular pertussis (Tdap, Td) vaccine. You may need a Td booster every 10 years. Zoster vaccine. You may need this after age 53. Pneumococcal 13-valent conjugate (PCV13) vaccine. One dose is recommended after age 42. Pneumococcal polysaccharide (PPSV23) vaccine. One dose is recommended after age 4. Talk to your health care provider about which screenings and vaccines you need and how often you need them. This information is not intended to replace advice given to you by your health care provider. Make sure you discuss any questions you have with your health care provider. Document Released: 02/20/2015 Document Revised: 10/14/2015 Document Reviewed: 11/25/2014 Elsevier Interactive Patient Education  2017 [employer].  Fall Prevention in the Home Falls can cause injuries. They can happen to people of all ages. There are many things you can do to make your home safe and to help prevent falls. What can I do on the outside of my home? Regularly fix the edges of walkways and driveways and fix any cracks. Remove anything that might make you trip as you walk through a door, such as a raised step or threshold. Trim any bushes or trees on the path to your home. Use bright outdoor lighting. Clear any walking paths of anything that might make someone trip, such as rocks or tools. Regularly check to see if handrails are loose or broken. Make sure that both sides of any steps have handrails. Any raised decks and porches should have guardrails on the edges. Have any leaves,  snow, or ice cleared regularly. Use sand or salt on walking paths during winter. Clean up any spills in your garage right away. This includes oil or grease spills. What can I do in the bathroom? Use night lights. Install grab bars by the toilet and in the tub and shower. Do not use towel bars as grab bars. Use non-skid mats or decals in the tub or shower. If you need to sit down in the shower, use a plastic, non-slip stool. Keep the floor dry. Clean up any water that spills on the floor as soon as it happens. Remove soap buildup in the tub or shower regularly. Attach bath mats securely with double-sided non-slip rug tape. Do not have throw rugs and other things on the floor that can make you trip. What can I do in the bedroom? Use night lights. Make sure that you have a light by your bed that is easy to reach. Do not use any sheets or blankets that are too big for your bed. They should not hang down onto the floor. Have a firm chair that has side arms. You can use this for support while you get dressed. Do not have throw rugs  and other things on the floor that can make you trip. What can I do in the kitchen? Clean up any spills right away. Avoid walking on wet floors. Keep items that you use a lot in easy-to-reach places. If you need to reach something above you, use a strong step stool that has a grab bar. Keep electrical cords out of the way. Do not use floor polish or wax that makes floors slippery. If you must use wax, use non-skid floor wax. Do not have throw rugs and other things on the floor that can make you trip. What can I do with my stairs? Do not leave any items on the stairs. Make sure that there are handrails on both sides of the stairs and use them. Fix handrails that are broken or loose. Make sure that handrails are as long as the stairways. Check any carpeting to make sure that it is firmly attached to the stairs. Fix any carpet that is loose or worn. Avoid having throw  rugs at the top or bottom of the stairs. If you do have throw rugs, attach them to the floor with carpet tape. Make sure that you have a light switch at the top of the stairs and the bottom of the stairs. If you do not have them, ask someone to add them for you. What else can I do to help prevent falls? Wear shoes that: Do not have high heels. Have rubber bottoms. Are comfortable and fit you well. Are closed at the toe. Do not wear sandals. If you use a stepladder: Make sure that it is fully opened. Do not climb a closed stepladder. Make sure that both sides of the stepladder are locked into place. Ask someone to hold it for you, if possible. Clearly mark and make sure that you can see: Any grab bars or handrails. First and last steps. Where the edge of each step is. Use tools that help you move around (mobility aids) if they are needed. These include: Canes. Walkers. Scooters. Crutches. Turn on the lights when you go into a dark area. Replace any light bulbs as soon as they burn out. Set up your furniture so you have a clear path. Avoid moving your furniture around. If any of your floors are uneven, fix them. If there are any pets around you, be aware of where they are. Review your medicines with your doctor. Some medicines can make you feel dizzy. This can increase your chance of falling. Ask your doctor what other things that you can do to help prevent falls. This information is not intended to replace advice given to you by your health care provider. Make sure you discuss any questions you have with your health care provider. Document Released: 11/20/2008 Document Revised: 07/02/2015 Document Reviewed: 02/28/2014 Elsevier Interactive Patient Education  2017 [employer].  Mammogram A mammogram is an X-ray of the breasts. This procedure can screen for and detect any changes that may indicate breast cancer. Mammograms are regularly done beginning at age 63 for women with average  risk. A man may have a mammogram if he has a lump or swelling in his breast tissue. A mammogram can also identify other changes and variations in the breast, such as: Inflammation of the breast tissue (mastitis). An infected area that contains a collection of pus (abscess). A fluid-filled sac (cyst). Tumors that are not cancerous (benign). Fibrocystic changes. This is when breast tissue becomes denser and can make the tissue feel rope-like or uneven under the  skin. Women at higher risk for breast cancer need earlier and more comprehensive screening for abnormal changes. Breast tomosynthesis, or three-dimensional (3D) mammography, and digital breast tomosynthesis are advanced forms of imaging that create 3D pictures of the breasts. Tell a health care provider: About any allergies you have. If you have breast implants. If you have had previous breast disease, biopsy, or surgery. If you have a family history of breast cancer. If you are breastfeeding. Whether you are pregnant or may be pregnant. What are the risks? Generally, this is a safe procedure. However, problems may occur, including: Exposure to radiation. Radiation levels are very low with this test. The need for more tests. The mammogram fails to detect certain cancers or the results are misinterpreted. Difficulty with detecting breast cancer in women with dense breasts. What happens before the procedure? Schedule your test about 1-2 weeks after your menstrual period if you are menstruating. This is usually when your breasts are the least tender. If you have had a mammogram done at a different facility in the past, get the mammogram X-rays or have them sent to your current exam facility. The new and old images will be compared. Wash your breasts and underarms on the day of the test. Do not wear deodorants, perfumes, lotions, or powders anywhere on your body on the day of the test. Remove any jewelry from your neck. Wear clothes that  you can change into and out of easily. What happens during the procedure?  You will undress from the waist up and put on a gown that opens in the front. You will stand in front of the X-ray machine. Each breast will be placed between two plastic or glass plates. The plates will compress your breast for a few seconds. Try to stay as relaxed as possible. This procedure does not cause any harm to your breasts. Any discomfort you feel will be very brief. X-rays will be taken from different angles of each breast. The procedure may vary among health care providers and hospitals. What can I expect after the procedure? The mammogram will be examined by a specialist (radiologist). You may need to repeat certain parts of the test, depending on the quality of the images. This is done if the radiologist needs a better view of the breast tissue. You may resume your normal activities. It is up to you to get the results of your procedure. Ask your health care provider, or the department that is doing the procedure, when your results will be ready. Summary A mammogram is an X-ray of the breasts. This procedure can screen for and detect any changes that may indicate breast cancer. A man may have a mammogram if he has a lump or swelling in his breast tissue. If you have had a mammogram done at a different facility in the past, get the mammogram X-rays or have them sent to your current exam facility in order to compare them. Schedule your test about 1-2 weeks after your menstrual period if you are menstruating. Ask when your test results will be ready. Make sure you get your test results. This information is not intended to replace advice given to you by your health care provider. Make sure you discuss any questions you have with your health care provider. Document Revised: 10/07/2020 Document Reviewed: 11/25/2019 Elsevier Patient Education  2023 [employer].

## 2022-06-13 ENCOUNTER — Other Ambulatory Visit (INDEPENDENT_AMBULATORY_CARE_PROVIDER_SITE_OTHER): Payer: Medicare Other

## 2022-06-13 DIAGNOSIS — E7800 Pure hypercholesterolemia, unspecified: Secondary | ICD-10-CM

## 2022-06-13 DIAGNOSIS — I10 Essential (primary) hypertension: Secondary | ICD-10-CM | POA: Diagnosis not present

## 2022-06-13 DIAGNOSIS — E1165 Type 2 diabetes mellitus with hyperglycemia: Secondary | ICD-10-CM

## 2022-06-13 LAB — CBC WITH DIFFERENTIAL/PLATELET
Basophils Absolute: 0.1 10*3/uL (ref 0.0–0.1)
Basophils Relative: 0.8 % (ref 0.0–3.0)
Eosinophils Absolute: 0.3 10*3/uL (ref 0.0–0.7)
Eosinophils Relative: 4.5 % (ref 0.0–5.0)
HCT: 41.1 % (ref 36.0–46.0)
Hemoglobin: 13.4 g/dL (ref 12.0–15.0)
Lymphocytes Relative: 54.2 % — ABNORMAL HIGH (ref 12.0–46.0)
Lymphs Abs: 3.8 10*3/uL (ref 0.7–4.0)
MCHC: 32.6 g/dL (ref 30.0–36.0)
MCV: 86.7 fl (ref 78.0–100.0)
Monocytes Absolute: 0.4 10*3/uL (ref 0.1–1.0)
Monocytes Relative: 5.9 % (ref 3.0–12.0)
Neutro Abs: 2.4 10*3/uL (ref 1.4–7.7)
Neutrophils Relative %: 34.6 % — ABNORMAL LOW (ref 43.0–77.0)
Platelets: 111 10*3/uL — ABNORMAL LOW (ref 150.0–400.0)
RBC: 4.74 Mil/uL (ref 3.87–5.11)
RDW: 16.8 % — ABNORMAL HIGH (ref 11.5–15.5)
WBC: 7 10*3/uL (ref 4.0–10.5)

## 2022-06-13 LAB — LIPID PANEL
Cholesterol: 113 mg/dL (ref 0–200)
HDL: 32.8 mg/dL — ABNORMAL LOW (ref >39.00)
LDL Cholesterol: 43 mg/dL (ref 0–99)
NonHDL: 80.15
Total CHOL/HDL Ratio: 3
Triglycerides: 187 mg/dL — ABNORMAL HIGH (ref 0.0–149.0)
VLDL: 37.4 mg/dL (ref 0.0–40.0)

## 2022-06-13 LAB — BASIC METABOLIC PANEL
BUN: 19 mg/dL (ref 6–23)
CO2: 24 mEq/L (ref 19–32)
Calcium: 9.3 mg/dL (ref 8.4–10.5)
Chloride: 109 mEq/L (ref 96–112)
Creatinine, Ser: 1.24 mg/dL — ABNORMAL HIGH (ref 0.40–1.20)
GFR: 42.26 mL/min — ABNORMAL LOW (ref >60.00)
Glucose, Bld: 151 mg/dL — ABNORMAL HIGH (ref 70–99)
Potassium: 4.4 mEq/L (ref 3.5–5.1)
Sodium: 141 mEq/L (ref 135–145)

## 2022-06-13 LAB — HEMOGLOBIN A1C: Hgb A1c MFr Bld: 7.7 % — ABNORMAL HIGH (ref 4.6–6.5)

## 2022-06-13 LAB — HEPATIC FUNCTION PANEL
ALT: 13 U/L (ref 0–35)
AST: 14 U/L (ref 0–37)
Albumin: 3.7 g/dL (ref 3.5–5.2)
Alkaline Phosphatase: 76 U/L (ref 39–117)
Bilirubin, Direct: 0.1 mg/dL (ref 0.0–0.3)
Total Bilirubin: 0.4 mg/dL (ref 0.2–1.2)
Total Protein: 6.3 g/dL (ref 6.0–8.3)

## 2022-06-13 LAB — TSH: TSH: 5.13 u[IU]/mL (ref 0.35–5.50)

## 2022-06-16 ENCOUNTER — Encounter: Payer: Self-pay | Admitting: Internal Medicine

## 2022-06-16 ENCOUNTER — Ambulatory Visit (INDEPENDENT_AMBULATORY_CARE_PROVIDER_SITE_OTHER): Payer: Medicare Other | Admitting: Internal Medicine

## 2022-06-16 VITALS — BP 130/72 | HR 74 | Temp 98.0°F | Resp 16 | Ht 62.0 in | Wt 131.0 lb

## 2022-06-16 DIAGNOSIS — L820 Inflamed seborrheic keratosis: Secondary | ICD-10-CM | POA: Diagnosis not present

## 2022-06-16 DIAGNOSIS — Z1231 Encounter for screening mammogram for malignant neoplasm of breast: Secondary | ICD-10-CM

## 2022-06-16 DIAGNOSIS — I10 Essential (primary) hypertension: Secondary | ICD-10-CM | POA: Diagnosis not present

## 2022-06-16 DIAGNOSIS — D3500 Benign neoplasm of unspecified adrenal gland: Secondary | ICD-10-CM | POA: Diagnosis not present

## 2022-06-16 DIAGNOSIS — D696 Thrombocytopenia, unspecified: Secondary | ICD-10-CM

## 2022-06-16 DIAGNOSIS — Z7984 Long term (current) use of oral hypoglycemic drugs: Secondary | ICD-10-CM | POA: Diagnosis not present

## 2022-06-16 DIAGNOSIS — Z87891 Personal history of nicotine dependence: Secondary | ICD-10-CM

## 2022-06-16 DIAGNOSIS — E1165 Type 2 diabetes mellitus with hyperglycemia: Secondary | ICD-10-CM | POA: Diagnosis not present

## 2022-06-16 DIAGNOSIS — D485 Neoplasm of uncertain behavior of skin: Secondary | ICD-10-CM | POA: Diagnosis not present

## 2022-06-16 DIAGNOSIS — R911 Solitary pulmonary nodule: Secondary | ICD-10-CM

## 2022-06-16 DIAGNOSIS — D2271 Melanocytic nevi of right lower limb, including hip: Secondary | ICD-10-CM | POA: Diagnosis not present

## 2022-06-16 DIAGNOSIS — L570 Actinic keratosis: Secondary | ICD-10-CM | POA: Diagnosis not present

## 2022-06-16 DIAGNOSIS — E7800 Pure hypercholesterolemia, unspecified: Secondary | ICD-10-CM | POA: Diagnosis not present

## 2022-06-16 DIAGNOSIS — I779 Disorder of arteries and arterioles, unspecified: Secondary | ICD-10-CM

## 2022-06-16 DIAGNOSIS — K2100 Gastro-esophageal reflux disease with esophagitis, without bleeding: Secondary | ICD-10-CM

## 2022-06-16 DIAGNOSIS — I700 Atherosclerosis of aorta: Secondary | ICD-10-CM | POA: Diagnosis not present

## 2022-06-16 DIAGNOSIS — Z7985 Long-term (current) use of injectable non-insulin antidiabetic drugs: Secondary | ICD-10-CM | POA: Diagnosis not present

## 2022-06-16 DIAGNOSIS — D2272 Melanocytic nevi of left lower limb, including hip: Secondary | ICD-10-CM | POA: Diagnosis not present

## 2022-06-16 DIAGNOSIS — D2261 Melanocytic nevi of right upper limb, including shoulder: Secondary | ICD-10-CM | POA: Diagnosis not present

## 2022-06-16 DIAGNOSIS — D2262 Melanocytic nevi of left upper limb, including shoulder: Secondary | ICD-10-CM | POA: Diagnosis not present

## 2022-06-16 DIAGNOSIS — D225 Melanocytic nevi of trunk: Secondary | ICD-10-CM | POA: Diagnosis not present

## 2022-06-16 DIAGNOSIS — E039 Hypothyroidism, unspecified: Secondary | ICD-10-CM

## 2022-06-16 DIAGNOSIS — L439 Lichen planus, unspecified: Secondary | ICD-10-CM | POA: Diagnosis not present

## 2022-06-16 DIAGNOSIS — D692 Other nonthrombocytopenic purpura: Secondary | ICD-10-CM | POA: Diagnosis not present

## 2022-06-16 DIAGNOSIS — L578 Other skin changes due to chronic exposure to nonionizing radiation: Secondary | ICD-10-CM | POA: Diagnosis not present

## 2022-06-16 MED ORDER — TRIAMCINOLONE ACETONIDE 0.1 % EX CREA: 1.0000 | TOPICAL_CREAM | Freq: Two times a day (BID) | CUTANEOUS | 0 refills | Status: AC

## 2022-06-16 MED ORDER — ATORVASTATIN CALCIUM 20 MG PO TABS: ORAL_TABLET | ORAL | 3 refills | Status: DC

## 2022-06-16 MED ORDER — CARVEDILOL 3.125 MG PO TABS: ORAL_TABLET | ORAL | 1 refills | Status: DC

## 2022-06-16 MED ORDER — LISINOPRIL 5 MG PO TABS: 5.0000 mg | ORAL_TABLET | Freq: Every day | ORAL | 1 refills | Status: DC

## 2022-06-16 MED ORDER — PANTOPRAZOLE SODIUM 40 MG PO TBEC: 40.0000 mg | DELAYED_RELEASE_TABLET | Freq: Every day | ORAL | 1 refills | Status: DC | PRN

## 2022-06-16 MED ORDER — LEVOTHYROXINE SODIUM 112 MCG PO TABS: ORAL_TABLET | ORAL | 1 refills | Status: DC

## 2022-06-16 NOTE — Progress Notes (Signed)
Subjective:    Patient ID: Ann Collins, female    DOB: May 17, 1945, 77 y.o.   MRN: [id number]  Patient here for  Chief Complaint  Patient presents with   Medical Management of Chronic Issues    HPI Here to follow up regarding diabetes and hypertension. Has been on ozempic and farxiga. Feels she is doing well with both medications.  She is eating better.  No nausea or vomiting. No swallowing problems.  No abdominal pain or bowel change. Handling stress.  Does not feel needs any further intervention.  Discussed recent labs.  A1c 7.7. Discussed diet.  Discussed exercise.  She is still smoking.     Past Medical History:  Diagnosis Date   Abdominal hernia    CAD (coronary artery disease)    a. LHC 7/19: LM nl, p-mLAD 100% w/ right to left collaterals, OM2 90% (2 mm), 1st LPL 40%, medical management   Diabetes mellitus (HCC)    Diverticulosis    GERD (gastroesophageal reflux disease)    Hematuria    Hypercholesterolemia    Hypertension    Hypothyroidism    Kidney stones    Past Surgical History:  Procedure Laterality Date   APPENDECTOMY     BREAST BIOPSY  1969   right   CHOLECYSTECTOMY  2005   COLONOSCOPY     COLONOSCOPY WITH PROPOFOL N/A 09/19/2017   Procedure: COLONOSCOPY WITH PROPOFOL;  Surgeon: Toledo, Boykin Nearing, MD;  Location: [facility];  Service: Gastroenterology;  Laterality: N/A;   ESOPHAGOGASTRODUODENOSCOPY (EGD) WITH PROPOFOL N/A 09/19/2017   Procedure: ESOPHAGOGASTRODUODENOSCOPY (EGD) WITH PROPOFOL;  Surgeon: Toledo, Boykin Nearing, MD;  Location: [facility];  Service: Gastroenterology;  Laterality: N/A;   LEFT HEART CATH AND CORONARY ANGIOGRAPHY Left 08/07/2017   Procedure: LEFT HEART CATH AND CORONARY ANGIOGRAPHY;  Surgeon: Iran Ouch, MD;  Location: [facility];  Service: Cardiovascular;  Laterality: Left;   UPPER GI ENDOSCOPY     Family History  Problem Relation Age of Onset   Brain cancer Mother        history of brain tumor   Lung  disease Father    Diabetes Brother        x2   Bipolar disorder Son    Alcohol abuse Son    Breast cancer Neg Hx    Colon cancer Neg Hx    Social History   Socioeconomic History   Marital status: Married    Spouse name: Not on file   Number of children: Not on file   Years of education: Not on file   Highest education level: 12th grade  Occupational History   Not on file  Tobacco Use   Smoking status: Every Day    Packs/day: 1.00    Years: 50.00    Additional pack years: 0.00    Total pack years: 50.00    Types: Cigarettes   Smokeless tobacco: Never  Vaping Use   Vaping Use: Never used  Substance and Sexual Activity   Alcohol use: No    Alcohol/week: 0.0 standard drinks of alcohol   Drug use: No   Sexual activity: Not on file  Other Topics Concern   Not on file  Social History Narrative   Not on file   Social Determinants of Health   Financial Resource Strain: Low Risk  (06/15/2022)   Overall Financial Resource Strain (CARDIA)    Difficulty of Paying Living Expenses: Not very hard  Food Insecurity: No Food Insecurity (06/15/2022)   Hunger  Vital Sign    Worried About Programme researcher, broadcasting/film/video in the Last Year: Never true    Ran Out of Food in the Last Year: Never true  Transportation Needs: No Transportation Needs (06/15/2022)   PRAPARE - Administrator, Civil Service (Medical): No    Lack of Transportation (Non-Medical): No  Physical Activity: Insufficiently Active (06/15/2022)   Exercise Vital Sign    Days of Exercise per Week: 3 days    Minutes of Exercise per Session: 30 min  Stress: No Stress Concern Present (06/15/2022)   [employer] of Occupational Health - Occupational Stress Questionnaire    Feeling of Stress : Only a little  Social Connections: Socially Integrated (06/15/2022)   Social Connection and Isolation Panel [NHANES]    Frequency of Communication with Friends and Family: More than three times a week    Frequency of Social Gatherings with  Friends and Family: Once a week    Attends Religious Services: More than 4 times per year    Active Member of [employer] or Organizations: Yes    Attends Engineer, structural: More than 4 times per year    Marital Status: Married     Review of Systems  Constitutional:  Negative for appetite change and unexpected weight change.  HENT:  Negative for congestion and sinus pressure.   Respiratory:  Negative for cough, chest tightness and shortness of breath.   Cardiovascular:  Negative for chest pain, palpitations and leg swelling.  Gastrointestinal:  Negative for abdominal pain, diarrhea, nausea and vomiting.  Genitourinary:  Negative for difficulty urinating and dysuria.  Musculoskeletal:  Negative for joint swelling and myalgias.  Skin:  Negative for color change and rash.       Easy bruising.  Neurological:  Negative for dizziness and headaches.  Psychiatric/Behavioral:  Negative for agitation and dysphoric mood.        Objective:     BP 130/72   Pulse 74   Temp 98 F (36.7 C)   Resp 16   Ht 5\' 2"  (1.575 m)   Wt 131 lb (59.4 kg)   LMP 03/10/1991   SpO2 99%   BMI 23.96 kg/m  Wt Readings from Last 3 Encounters:  06/16/22 131 lb (59.4 kg)  06/06/22 133 lb (60.3 kg)  03/15/22 133 lb 3.2 oz (60.4 kg)    Physical Exam Vitals reviewed.  Constitutional:      General: She is not in acute distress.    Appearance: Normal appearance.  HENT:     Head: Normocephalic and atraumatic.     Right Ear: External ear normal.     Left Ear: External ear normal.  Eyes:     General: No scleral icterus.       Right eye: No discharge.        Left eye: No discharge.     Conjunctiva/sclera: Conjunctivae normal.  Neck:     Thyroid: No thyromegaly.  Cardiovascular:     Rate and Rhythm: Normal rate and regular rhythm.  Pulmonary:     Effort: No respiratory distress.     Breath sounds: Normal breath sounds. No wheezing.  Abdominal:     General: Bowel sounds are normal.      Palpations: Abdomen is soft.     Tenderness: There is no abdominal tenderness.  Musculoskeletal:        General: No swelling or tenderness.     Cervical back: Neck supple. No tenderness.  Lymphadenopathy:     Cervical:  No cervical adenopathy.  Skin:    Findings: No erythema or rash.  Neurological:     Mental Status: She is alert.  Psychiatric:        Mood and Affect: Mood normal.        Behavior: Behavior normal.      Outpatient Encounter Medications as of 06/16/2022  Medication Sig   Accu-Chek Softclix Lancets lancets USE TO TEST BLOOD SUGAR FOUR TIMES DAILY   aspirin 81 MG tablet Take 81 mg by mouth daily.   atorvastatin (LIPITOR) 20 MG tablet TAKE 1 TABLET(20 MG) BY MOUTH DAILY   blood glucose meter kit and supplies Dispense based on patient and insurance preference. Use up to four times daily as directed. (FOR ICD-10 E10.9, E11.9).   carvedilol (COREG) 3.125 MG tablet TAKE 1 TABLET(3.125 MG) BY MOUTH TWICE DAILY   dapagliflozin propanediol (FARXIGA) 10 MG TABS tablet Take 1 tablet (10 mg total) by mouth in the morning.   glucose blood test strip Use as instructed to blood sugar twice daily   levothyroxine (SYNTHROID) 112 MCG tablet TAKE 1 TABLET(112 MCG) BY MOUTH DAILY BEFORE AND BREAKFAST   lisinopril (ZESTRIL) 5 MG tablet Take 1 tablet (5 mg total) by mouth daily.   pantoprazole (PROTONIX) 40 MG tablet Take 1 tablet (40 mg total) by mouth daily as needed.   Semaglutide,0.25 or 0.5MG /DOS, (OZEMPIC, 0.25 OR 0.5 MG/DOSE,) 2 MG/3ML SOPN Inject 0.25 mg + 10 clicks weekly   triamcinolone cream (KENALOG) 0.1 % Apply 1 Application topically 2 (two) times daily.   vitamin B-12 (CYANOCOBALAMIN) 1000 MCG tablet Take 1,000 mcg by mouth daily.   [DISCONTINUED] atorvastatin (LIPITOR) 20 MG tablet TAKE 1 TABLET(20 MG) BY MOUTH DAILY   [DISCONTINUED] carvedilol (COREG) 3.125 MG tablet TAKE 1 TABLET(3.125 MG) BY MOUTH TWICE DAILY   [DISCONTINUED] levothyroxine (SYNTHROID) 112 MCG tablet TAKE 1  TABLET(112 MCG) BY MOUTH DAILY BEFORE AND BREAKFAST   [DISCONTINUED] lisinopril (ZESTRIL) 5 MG tablet Take 1 tablet (5 mg total) by mouth daily.   [DISCONTINUED] pantoprazole (PROTONIX) 40 MG tablet Take 1 tablet (40 mg total) by mouth daily as needed.   [DISCONTINUED] triamcinolone (KENALOG) 0.1 % Apply 1 application topically 2 (two) times daily.   No facility-administered encounter medications on file as of 06/16/2022.     Lab Results  Component Value Date   WBC 7.0 06/13/2022   HGB 13.4 06/13/2022   HCT 41.1 06/13/2022   PLT 111.0 (L) 06/13/2022   GLUCOSE 151 (H) 06/13/2022   CHOL 113 06/13/2022   TRIG 187.0 (H) 06/13/2022   HDL 32.80 (L) 06/13/2022   LDLDIRECT 64.0 12/08/2021   LDLCALC 43 06/13/2022   ALT 13 06/13/2022   AST 14 06/13/2022   NA 141 06/13/2022   K 4.4 06/13/2022   CL 109 06/13/2022   CREATININE 1.24 (H) 06/13/2022   BUN 19 06/13/2022   CO2 24 06/13/2022   TSH 5.13 06/13/2022   INR 0.87 08/07/2017   HGBA1C 7.7 (H) 06/13/2022   MICROALBUR 0.8 08/04/2021    MM 3D SCREEN BREAST BILATERAL  Result Date: 07/06/2021 CLINICAL DATA:  Screening. EXAM: DIGITAL SCREENING BILATERAL MAMMOGRAM WITH TOMOSYNTHESIS AND CAD TECHNIQUE: Bilateral screening digital craniocaudal and mediolateral oblique mammograms were obtained. Bilateral screening digital breast tomosynthesis was performed. The images were evaluated with computer-aided detection. COMPARISON:  Previous exam(s). ACR Breast Density Category b: There are scattered areas of fibroglandular density. FINDINGS: There are no findings suspicious for malignancy. IMPRESSION: No mammographic evidence of malignancy. A result letter of  this screening mammogram will be mailed directly to the patient. RECOMMENDATION: Screening mammogram in one year. (Code:[id number]-B-01Y) BI-RADS CATEGORY  1: Negative. Electronically Signed   By: Frederico Hamman M.D.   On: 07/06/2021 15:41       Assessment & Plan:  Type 2 diabetes mellitus with  hyperglycemia, without long-term current use of insulin (HCC) Assessment & Plan: On ozempic and farxiga.  Discussed diet and exercise.  Tolerating.  Follow sugars.  Follow met b and A1c.  No changes today.  Check and record sugars and send in readings.   Lab Results  Component Value Date   HGBA1C 7.7 (H) 06/13/2022     Orders: -     Hemoglobin A1c; Future -     Basic metabolic panel; Future  Hypothyroidism, unspecified type Assessment & Plan: On thyroid replacement.  Follow tsh.  Orders: -     Levothyroxine Sodium; TAKE 1 TABLET(112 MCG) BY MOUTH DAILY BEFORE AND BREAKFAST  Dispense: 90 tablet; Refill: 1  Primary hypertension Assessment & Plan: Continue lisinopril.  Blood pressure doing well.  Follow pressures.  Follow metabolic panel.   Orders: -     Lisinopril; Take 1 tablet (5 mg total) by mouth daily.  Dispense: 90 tablet; Refill: 1  Hypercholesterolemia Assessment & Plan: Continue lipitor.  Follow lipid panel and liver function tests.    Orders: -     Lipid panel; Future -     Hepatic function panel; Future  Thrombocytopenia (HCC) Assessment & Plan: Follow cbc.   Orders: -     Basic metabolic panel; Future -     CBC with Differential/Platelet; Future -     CBC with Differential/Platelet; Future  Visit for screening mammogram -     3D Screening Mammogram, Left and Right; Future  Adrenal adenoma, unspecified laterality Assessment & Plan: Stable CT - adrenal stable.    Aortic atherosclerosis (HCC) Assessment & Plan: Continue lipitor.    Carotid artery disease, unspecified laterality, unspecified type [facility]) Assessment & Plan: Carotid ultrasound 06/02/21:  Right Carotid: Velocities in the right ICA are consistent with a 1-39% stenosis.                Non-hemodynamically significant plaque <50% noted in the CCA. The                ECA appears <50% stenosed.   Left Carotid: There was no evidence of thrombus, dissection, atherosclerotic               plaque  or stenosis in the cervical carotid system.  Continue lipitor.    Gastroesophageal reflux disease with esophagitis without hemorrhage Assessment & Plan: No upper symptoms reported. On protonix.    Lung nodule Assessment & Plan: CT 05/11/21 - Lung-RADS 2S, benign appearance or behavior. Continue annual screening with low-dose chest CT without contrast in 12 months. Reports she was told she "aged out" of screening.  Will contact for f/u lung nodule.     Personal history of tobacco use, presenting hazards to health Assessment & Plan: Discussed the need to quit.  Desires not to quit at this time.  Screening chest CT as outlined.    Other orders -     Pantoprazole Sodium; Take 1 tablet (40 mg total) by mouth daily as needed.  Dispense: 90 tablet; Refill: 1 -     Triamcinolone Acetonide; Apply 1 Application topically 2 (two) times daily.  Dispense: 30 g; Refill: 0 -     Atorvastatin Calcium; TAKE 1 TABLET(20  MG) BY MOUTH DAILY  Dispense: 90 tablet; Refill: 3 -     Carvedilol; TAKE 1 TABLET(3.125 MG) BY MOUTH TWICE DAILY  Dispense: 180 tablet; Refill: 1     [place], MD

## 2022-06-19 ENCOUNTER — Encounter: Payer: Self-pay | Admitting: Internal Medicine

## 2022-06-19 NOTE — Assessment & Plan Note (Signed)
No upper symptoms reported.  On protonix.   

## 2022-06-19 NOTE — Assessment & Plan Note (Signed)
Continue lipitor.  Follow lipid panel and liver function tests.   

## 2022-06-19 NOTE — Assessment & Plan Note (Signed)
On thyroid replacement.  Follow tsh.  

## 2022-06-19 NOTE — Assessment & Plan Note (Signed)
CT 05/11/21 - Lung-RADS 2S, benign appearance or behavior. Continue annual screening with low-dose chest CT without contrast in 12 months. Reports she was told she "aged out" of screening.  Will contact for f/u lung nodule.

## 2022-06-19 NOTE — Assessment & Plan Note (Signed)
Stable CT - adrenal stable.  

## 2022-06-19 NOTE — Assessment & Plan Note (Signed)
Follow cbc.  

## 2022-06-19 NOTE — Assessment & Plan Note (Signed)
Continue lipitor  ?

## 2022-06-19 NOTE — Assessment & Plan Note (Signed)
Carotid ultrasound 06/02/21:  Right Carotid: Velocities in the right ICA are consistent with a 1-39% stenosis.                Non-hemodynamically significant plaque <50% noted in the CCA. The                ECA appears <50% stenosed.   Left Carotid: There was no evidence of thrombus, dissection, atherosclerotic               plaque or stenosis in the cervical carotid system.  Continue lipitor.  

## 2022-06-19 NOTE — Assessment & Plan Note (Signed)
Continue lisinopril.  Blood pressure doing well.  Follow pressures.  Follow metabolic panel.  

## 2022-06-19 NOTE — Assessment & Plan Note (Signed)
Discussed the need to quit.  Desires not to quit at this time.  Screening chest CT as outlined.  

## 2022-06-19 NOTE — Assessment & Plan Note (Signed)
On ozempic and farxiga.  Discussed diet and exercise.  Tolerating.  Follow sugars.  Follow met b and A1c.  No changes today.  Check and record sugars and send in readings.   Lab Results  Component Value Date   HGBA1C 7.7 (H) 06/13/2022

## 2022-06-30 ENCOUNTER — Other Ambulatory Visit (INDEPENDENT_AMBULATORY_CARE_PROVIDER_SITE_OTHER): Payer: Medicare Other

## 2022-06-30 DIAGNOSIS — E1165 Type 2 diabetes mellitus with hyperglycemia: Secondary | ICD-10-CM | POA: Diagnosis not present

## 2022-06-30 DIAGNOSIS — D696 Thrombocytopenia, unspecified: Secondary | ICD-10-CM | POA: Diagnosis not present

## 2022-06-30 DIAGNOSIS — E7800 Pure hypercholesterolemia, unspecified: Secondary | ICD-10-CM

## 2022-06-30 LAB — HEPATIC FUNCTION PANEL
ALT: 16 U/L (ref 0–35)
AST: 18 U/L (ref 0–37)
Albumin: 4 g/dL (ref 3.5–5.2)
Alkaline Phosphatase: 74 U/L (ref 39–117)
Bilirubin, Direct: 0.1 mg/dL (ref 0.0–0.3)
Total Bilirubin: 0.5 mg/dL (ref 0.2–1.2)
Total Protein: 6.7 g/dL (ref 6.0–8.3)

## 2022-06-30 LAB — CBC WITH DIFFERENTIAL/PLATELET
Basophils Absolute: 0.1 10*3/uL (ref 0.0–0.1)
Basophils Relative: 1 % (ref 0.0–3.0)
Eosinophils Absolute: 0.2 10*3/uL (ref 0.0–0.7)
Eosinophils Relative: 2.7 % (ref 0.0–5.0)
HCT: 42.1 % (ref 36.0–46.0)
Hemoglobin: 13.5 g/dL (ref 12.0–15.0)
Lymphocytes Relative: 48.5 % — ABNORMAL HIGH (ref 12.0–46.0)
Lymphs Abs: 3.2 10*3/uL (ref 0.7–4.0)
MCHC: 32 g/dL (ref 30.0–36.0)
MCV: 87.6 fl (ref 78.0–100.0)
Monocytes Absolute: 0.3 10*3/uL (ref 0.1–1.0)
Monocytes Relative: 4.4 % (ref 3.0–12.0)
Neutro Abs: 2.9 10*3/uL (ref 1.4–7.7)
Neutrophils Relative %: 43.4 % (ref 43.0–77.0)
Platelets: 119 10*3/uL — ABNORMAL LOW (ref 150.0–400.0)
RBC: 4.8 Mil/uL (ref 3.87–5.11)
RDW: 16.6 % — ABNORMAL HIGH (ref 11.5–15.5)
WBC: 6.6 10*3/uL (ref 4.0–10.5)

## 2022-06-30 LAB — LIPID PANEL
Cholesterol: 141 mg/dL (ref 0–200)
HDL: 34.6 mg/dL — ABNORMAL LOW (ref >39.00)
LDL Cholesterol: 75 mg/dL (ref 0–99)
NonHDL: 106.58
Total CHOL/HDL Ratio: 4
Triglycerides: 156 mg/dL — ABNORMAL HIGH (ref 0.0–149.0)
VLDL: 31.2 mg/dL (ref 0.0–40.0)

## 2022-06-30 LAB — BASIC METABOLIC PANEL
BUN: 19 mg/dL (ref 6–23)
CO2: 29 mEq/L (ref 19–32)
Calcium: 9.3 mg/dL (ref 8.4–10.5)
Chloride: 105 mEq/L (ref 96–112)
Creatinine, Ser: 1.11 mg/dL (ref 0.40–1.20)
GFR: 48.25 mL/min — ABNORMAL LOW (ref >60.00)
Glucose, Bld: 146 mg/dL — ABNORMAL HIGH (ref 70–99)
Potassium: 4.7 mEq/L (ref 3.5–5.1)
Sodium: 138 mEq/L (ref 135–145)

## 2022-06-30 LAB — HEMOGLOBIN A1C: Hgb A1c MFr Bld: 7.6 % — ABNORMAL HIGH (ref 4.6–6.5)

## 2022-07-12 ENCOUNTER — Telehealth: Payer: Self-pay | Admitting: Internal Medicine

## 2022-07-12 NOTE — Telephone Encounter (Signed)
Ann Collins was being followed for lung screening (CT chest).  Was told she aged out.  Discussed with pulmonary, given they were following nodules, they could still follow for f/u regarding the nodules.  If she is agreeable, I would like to refer for lung nodules f/u - through pulmonary.  I can place order for referral if agreeable.

## 2022-07-12 NOTE — Telephone Encounter (Signed)
Patient is agreeable.

## 2022-08-10 ENCOUNTER — Telehealth: Payer: Self-pay

## 2022-08-10 NOTE — Telephone Encounter (Signed)
Patient assistance ozempic received. Labeled and placed in refrigerator for patient to pick up. LM for patient.

## 2022-08-12 NOTE — Telephone Encounter (Signed)
Noted  

## 2022-08-12 NOTE — Telephone Encounter (Signed)
Pt returned [place] LPN call. Note below was read to her. Pt aware and understood of med its ready for pick up.

## 2022-09-07 ENCOUNTER — Encounter (INDEPENDENT_AMBULATORY_CARE_PROVIDER_SITE_OTHER): Payer: Self-pay

## 2022-09-14 ENCOUNTER — Telehealth: Payer: Self-pay | Admitting: Internal Medicine

## 2022-09-14 DIAGNOSIS — E1165 Type 2 diabetes mellitus with hyperglycemia: Secondary | ICD-10-CM

## 2022-09-14 DIAGNOSIS — E7800 Pure hypercholesterolemia, unspecified: Secondary | ICD-10-CM

## 2022-09-14 NOTE — Telephone Encounter (Signed)
Needs fasting labs. Labs were credited in May. Future labs ordered

## 2022-09-14 NOTE — Telephone Encounter (Signed)
Too early for fasting labs

## 2022-09-14 NOTE — Telephone Encounter (Signed)
Patient need lab orders, the note says Fasting Labs but there is only a CBC/BMP ordered. I just want to double check.

## 2022-09-15 ENCOUNTER — Other Ambulatory Visit: Payer: Self-pay | Admitting: Acute Care

## 2022-09-15 ENCOUNTER — Other Ambulatory Visit: Payer: Self-pay | Admitting: Oncology

## 2022-09-15 DIAGNOSIS — F17210 Nicotine dependence, cigarettes, uncomplicated: Secondary | ICD-10-CM

## 2022-09-15 DIAGNOSIS — Z006 Encounter for examination for normal comparison and control in clinical research program: Secondary | ICD-10-CM

## 2022-09-15 DIAGNOSIS — Z122 Encounter for screening for malignant neoplasm of respiratory organs: Secondary | ICD-10-CM

## 2022-09-15 DIAGNOSIS — Z87891 Personal history of nicotine dependence: Secondary | ICD-10-CM

## 2022-09-21 ENCOUNTER — Ambulatory Visit
Admission: RE | Admit: 2022-09-21 | Discharge: 2022-09-21 | Disposition: A | Discharge status: Home / Self Care | Payer: Medicare Other | Source: Ambulatory Visit | Attending: Internal Medicine | Admitting: Internal Medicine

## 2022-09-21 ENCOUNTER — Other Ambulatory Visit: Payer: Medicare Other

## 2022-09-21 DIAGNOSIS — D696 Thrombocytopenia, unspecified: Secondary | ICD-10-CM | POA: Diagnosis not present

## 2022-09-21 DIAGNOSIS — Z122 Encounter for screening for malignant neoplasm of respiratory organs: Secondary | ICD-10-CM | POA: Diagnosis not present

## 2022-09-21 DIAGNOSIS — E7800 Pure hypercholesterolemia, unspecified: Secondary | ICD-10-CM | POA: Diagnosis not present

## 2022-09-21 DIAGNOSIS — E1165 Type 2 diabetes mellitus with hyperglycemia: Secondary | ICD-10-CM

## 2022-09-21 DIAGNOSIS — F17210 Nicotine dependence, cigarettes, uncomplicated: Secondary | ICD-10-CM | POA: Insufficient documentation

## 2022-09-21 DIAGNOSIS — Z87891 Personal history of nicotine dependence: Secondary | ICD-10-CM | POA: Insufficient documentation

## 2022-09-21 LAB — LIPID PANEL
Cholesterol: 122 mg/dL (ref 0–200)
HDL: 39.6 mg/dL (ref >39.00)
LDL Cholesterol: 58 mg/dL (ref 0–99)
NonHDL: 82.5
Total CHOL/HDL Ratio: 3
Triglycerides: 124 mg/dL (ref 0.0–149.0)
VLDL: 24.8 mg/dL (ref 0.0–40.0)

## 2022-09-21 LAB — HEMOGLOBIN A1C: Hgb A1c MFr Bld: 7.2 % — ABNORMAL HIGH (ref 4.6–6.5)

## 2022-09-21 LAB — CBC WITH DIFFERENTIAL/PLATELET
Basophils Absolute: 0.1 10*3/uL (ref 0.0–0.1)
Basophils Relative: 1.1 % (ref 0.0–3.0)
Eosinophils Absolute: 0.3 10*3/uL (ref 0.0–0.7)
Eosinophils Relative: 4.1 % (ref 0.0–5.0)
HCT: 40.4 % (ref 36.0–46.0)
Hemoglobin: 12.8 g/dL (ref 12.0–15.0)
Lymphocytes Relative: 58.6 % — ABNORMAL HIGH (ref 12.0–46.0)
Lymphs Abs: 4.4 10*3/uL — ABNORMAL HIGH (ref 0.7–4.0)
MCHC: 31.6 g/dL (ref 30.0–36.0)
MCV: 87.5 fl (ref 78.0–100.0)
Monocytes Absolute: 0.4 10*3/uL (ref 0.1–1.0)
Monocytes Relative: 6 % (ref 3.0–12.0)
Neutro Abs: 2.3 10*3/uL (ref 1.4–7.7)
Neutrophils Relative %: 30.2 % — ABNORMAL LOW (ref 43.0–77.0)
Platelets: 109 10*3/uL — ABNORMAL LOW (ref 150.0–400.0)
RBC: 4.62 Mil/uL (ref 3.87–5.11)
RDW: 16.9 % — ABNORMAL HIGH (ref 11.5–15.5)
WBC: 7.5 10*3/uL (ref 4.0–10.5)

## 2022-09-21 LAB — BASIC METABOLIC PANEL
BUN: 18 mg/dL (ref 6–23)
CO2: 27 mEq/L (ref 19–32)
Calcium: 9.5 mg/dL (ref 8.4–10.5)
Chloride: 109 mEq/L (ref 96–112)
Creatinine, Ser: 1.1 mg/dL (ref 0.40–1.20)
GFR: 48.69 mL/min — ABNORMAL LOW (ref >60.00)
Glucose, Bld: 142 mg/dL — ABNORMAL HIGH (ref 70–99)
Potassium: 5 mEq/L (ref 3.5–5.1)
Sodium: 142 mEq/L (ref 135–145)

## 2022-09-21 LAB — HEPATIC FUNCTION PANEL
ALT: 23 U/L (ref 0–35)
AST: 18 U/L (ref 0–37)
Albumin: 3.8 g/dL (ref 3.5–5.2)
Alkaline Phosphatase: 79 U/L (ref 39–117)
Bilirubin, Direct: 0.1 mg/dL (ref 0.0–0.3)
Total Bilirubin: 0.4 mg/dL (ref 0.2–1.2)
Total Protein: 6.4 g/dL (ref 6.0–8.3)

## 2022-09-23 ENCOUNTER — Ambulatory Visit (INDEPENDENT_AMBULATORY_CARE_PROVIDER_SITE_OTHER): Payer: Medicare Other | Admitting: Internal Medicine

## 2022-09-23 ENCOUNTER — Encounter: Payer: Self-pay | Admitting: Internal Medicine

## 2022-09-23 VITALS — BP 124/62 | HR 72 | Temp 98.0°F | Resp 16 | Ht 62.0 in | Wt 129.6 lb

## 2022-09-23 DIAGNOSIS — E7800 Pure hypercholesterolemia, unspecified: Secondary | ICD-10-CM | POA: Diagnosis not present

## 2022-09-23 DIAGNOSIS — I779 Disorder of arteries and arterioles, unspecified: Secondary | ICD-10-CM

## 2022-09-23 DIAGNOSIS — I700 Atherosclerosis of aorta: Secondary | ICD-10-CM

## 2022-09-23 DIAGNOSIS — R911 Solitary pulmonary nodule: Secondary | ICD-10-CM

## 2022-09-23 DIAGNOSIS — E039 Hypothyroidism, unspecified: Secondary | ICD-10-CM | POA: Diagnosis not present

## 2022-09-23 DIAGNOSIS — E1165 Type 2 diabetes mellitus with hyperglycemia: Secondary | ICD-10-CM

## 2022-09-23 DIAGNOSIS — D696 Thrombocytopenia, unspecified: Secondary | ICD-10-CM | POA: Diagnosis not present

## 2022-09-23 DIAGNOSIS — K2100 Gastro-esophageal reflux disease with esophagitis, without bleeding: Secondary | ICD-10-CM

## 2022-09-23 DIAGNOSIS — I10 Essential (primary) hypertension: Secondary | ICD-10-CM | POA: Diagnosis not present

## 2022-09-23 DIAGNOSIS — F439 Reaction to severe stress, unspecified: Secondary | ICD-10-CM | POA: Diagnosis not present

## 2022-09-23 DIAGNOSIS — Z1211 Encounter for screening for malignant neoplasm of colon: Secondary | ICD-10-CM

## 2022-09-23 DIAGNOSIS — Z87891 Personal history of nicotine dependence: Secondary | ICD-10-CM

## 2022-09-23 DIAGNOSIS — Z0000 Encounter for general adult medical examination without abnormal findings: Secondary | ICD-10-CM

## 2022-09-23 NOTE — Assessment & Plan Note (Addendum)
Physical today 09/23/22.  Mammogram 07/06/21 - Birads I.  Colonoscopy 09/2017 - recommended f/u in 5 years.  Discussed due colonoscopy.  Referral to GI placed.

## 2022-09-23 NOTE — Assessment & Plan Note (Signed)
On ozempic and farxiga.  Discussed diet and exercise.  Tolerating.  Follow sugars.  Follow met b and A1c.  No changes today.  Check and record sugars.  A1c improved.   Lab Results  Component Value Date   HGBA1C 7.2 (H) 09/21/2022

## 2022-09-23 NOTE — Assessment & Plan Note (Signed)
Discussed the need to quit.  Desires not to quit at this time.  States - calms her down.  Follow.

## 2022-09-23 NOTE — Assessment & Plan Note (Signed)
On thyroid replacement.  Follow tsh.  

## 2022-09-23 NOTE — Assessment & Plan Note (Signed)
Colonoscopy 09/2017 - internal hemorrhoids and 2 3-4mm polyps.  Continue f/u with GI.  

## 2022-09-23 NOTE — Assessment & Plan Note (Signed)
Follow cbc. Persistent decreased platelet count.  Previous echo - spleen normal.  Discussed further w/up.  Refer to hematology for further evaluation.

## 2022-09-23 NOTE — Assessment & Plan Note (Signed)
Increased stress.  Discussed.  Does not feel needs any further intervention.  Follow.   

## 2022-09-23 NOTE — Assessment & Plan Note (Signed)
No upper symptoms reported.  On protonix.   

## 2022-09-23 NOTE — Progress Notes (Signed)
Subjective:    Patient ID: Ann Collins, female    DOB: 04-13-1945, 77 y.o.   MRN: [id number]   HPI With past history of diabetes, hypercholesterolemia and hypertension.  Comes in today to follow up on these issues as well as for a complete physical exam.  She reports she is doing well.  Stays active.  No chest pain or sob reported.  Eating.  No abdominal pain or bowel change reported.  Discussed labs.  Decreased platelet count - persistent.  Discussed hematology evaluation.  Does not drink.  Continues to smoke.  Desires not to quit at this time.  Helps her relax.  Some left foot numbness.  No weakness.    Past Medical History:  Diagnosis Date   Abdominal hernia    CAD (coronary artery disease)    a. LHC 7/19: LM nl, p-mLAD 100% w/ right to left collaterals, OM2 90% (2 mm), 1st LPL 40%, medical management   Diabetes mellitus (HCC)    Diverticulosis    GERD (gastroesophageal reflux disease)    Hematuria    Hypercholesterolemia    Hypertension    Hypothyroidism    Kidney stones    Past Surgical History:  Procedure Laterality Date   APPENDECTOMY     BREAST BIOPSY  1969   right   CHOLECYSTECTOMY  2005   COLONOSCOPY     COLONOSCOPY WITH PROPOFOL N/A 09/19/2017   Procedure: COLONOSCOPY WITH PROPOFOL;  Surgeon: Toledo, Boykin Nearing, MD;  Location: [facility];  Service: Gastroenterology;  Laterality: N/A;   ESOPHAGOGASTRODUODENOSCOPY (EGD) WITH PROPOFOL N/A 09/19/2017   Procedure: ESOPHAGOGASTRODUODENOSCOPY (EGD) WITH PROPOFOL;  Surgeon: Toledo, Boykin Nearing, MD;  Location: [facility];  Service: Gastroenterology;  Laterality: N/A;   LEFT HEART CATH AND CORONARY ANGIOGRAPHY Left 08/07/2017   Procedure: LEFT HEART CATH AND CORONARY ANGIOGRAPHY;  Surgeon: Iran Ouch, MD;  Location: [facility]IVE CV [facility];  Service: Cardiovascular;  Laterality: Left;   UPPER GI ENDOSCOPY     Family History  Problem Relation Age of Onset   Brain cancer Mother        history of brain tumor   Lung  disease Father    Diabetes Brother        x2   Bipolar disorder Son    Alcohol abuse Son    Breast cancer Neg Hx    Colon cancer Neg Hx    Social History   Socioeconomic History   Marital status: Married    Spouse name: Not on file   Number of children: Not on file   Years of education: Not on file   Highest education level: 12th grade  Occupational History   Not on file  Tobacco Use   Smoking status: Every Day    Current packs/day: 1.00    Average packs/day: 1 pack/day for 50.0 years (50.0 ttl pk-yrs)    Types: Cigarettes   Smokeless tobacco: Never  Vaping Use   Vaping status: Never Used  Substance and Sexual Activity   Alcohol use: No    Alcohol/week: 0.0 standard drinks of alcohol   Drug use: No   Sexual activity: Not on file  Other Topics Concern   Not on file  Social History Narrative   Not on file   Social Determinants of Health   Financial Resource Strain: Low Risk  (06/15/2022)   Overall Financial Resource Strain (CARDIA)    Difficulty of Paying Living Expenses: Not very hard  Food Insecurity: No Food Insecurity (06/15/2022)   Hunger  Vital Sign    Worried About Programme researcher, broadcasting/film/video in the Last Year: Never true    Ran Out of Food in the Last Year: Never true  Transportation Needs: No Transportation Needs (06/15/2022)   PRAPARE - Administrator, Civil Service (Medical): No    Lack of Transportation (Non-Medical): No  Physical Activity: Insufficiently Active (06/15/2022)   Exercise Vital Sign    Days of Exercise per Week: 3 days    Minutes of Exercise per Session: 30 min  Stress: No Stress Concern Present (06/15/2022)   [employer] of Occupational Health - Occupational Stress Questionnaire    Feeling of Stress : Only a little  Social Connections: Socially Integrated (06/15/2022)   Social Connection and Isolation Panel [NHANES]    Frequency of Communication with Friends and Family: More than three times a week    Frequency of Social Gatherings with  Friends and Family: Once a week    Attends Religious Services: More than 4 times per year    Active Member of [employer] or Organizations: Yes    Attends Engineer, structural: More than 4 times per year    Marital Status: Married     Review of Systems  Constitutional:  Negative for appetite change and unexpected weight change.  HENT:  Negative for congestion, sinus pressure and sore throat.   Eyes:  Negative for pain and visual disturbance.  Respiratory:  Negative for cough, chest tightness and shortness of breath.   Cardiovascular:  Negative for chest pain, palpitations and leg swelling.  Gastrointestinal:  Negative for abdominal pain, diarrhea, nausea and vomiting.  Genitourinary:  Negative for difficulty urinating and dysuria.  Musculoskeletal:  Negative for joint swelling and myalgias.  Skin:  Negative for color change and rash.  Neurological:  Negative for dizziness and headaches.  Hematological:  Negative for adenopathy. Does not bruise/bleed easily.  Psychiatric/Behavioral:  Negative for agitation and dysphoric mood.        Objective:     BP 124/62   Pulse 72   Temp 98 F (36.7 C)   Resp 16   Ht 5\' 2"  (1.575 m)   Wt 129 lb 9.6 oz (58.8 kg)   LMP 03/10/1991   SpO2 98%   BMI 23.70 kg/m  Wt Readings from Last 3 Encounters:  09/23/22 129 lb 9.6 oz (58.8 kg)  06/16/22 131 lb (59.4 kg)  06/06/22 133 lb (60.3 kg)    Physical Exam Vitals reviewed.  Constitutional:      General: She is not in acute distress.    Appearance: Normal appearance. She is well-developed.  HENT:     Head: Normocephalic and atraumatic.     Right Ear: External ear normal.     Left Ear: External ear normal.  Eyes:     General: No scleral icterus.       Right eye: No discharge.        Left eye: No discharge.     Conjunctiva/sclera: Conjunctivae normal.  Neck:     Thyroid: No thyromegaly.  Cardiovascular:     Rate and Rhythm: Normal rate and regular rhythm.  Pulmonary:     Effort:  No tachypnea, accessory muscle usage or respiratory distress.     Breath sounds: Normal breath sounds. No decreased breath sounds or wheezing.  Chest:  Breasts:    Right: No inverted nipple, mass, nipple discharge or tenderness (no axillary adenopathy).     Left: No inverted nipple, mass, nipple discharge or tenderness (no  axilarry adenopathy).  Abdominal:     General: Bowel sounds are normal.     Palpations: Abdomen is soft.     Tenderness: There is no abdominal tenderness.  Musculoskeletal:        General: No swelling or tenderness.     Cervical back: Neck supple.     Comments: DP pulses palpable and equal bilaterally.  Sensation intact.    Lymphadenopathy:     Cervical: No cervical adenopathy.  Skin:    Findings: No erythema or rash.  Neurological:     Mental Status: She is alert and oriented to person, place, and time.  Psychiatric:        Mood and Affect: Mood normal.        Behavior: Behavior normal.      Outpatient Encounter Medications as of 09/23/2022  Medication Sig   Accu-Chek Softclix Lancets lancets USE TO TEST BLOOD SUGAR FOUR TIMES DAILY   aspirin 81 MG tablet Take 81 mg by mouth daily.   atorvastatin (LIPITOR) 20 MG tablet TAKE 1 TABLET(20 MG) BY MOUTH DAILY   blood glucose meter kit and supplies Dispense based on patient and insurance preference. Use up to four times daily as directed. (FOR ICD-10 E10.9, E11.9).   carvedilol (COREG) 3.125 MG tablet TAKE 1 TABLET(3.125 MG) BY MOUTH TWICE DAILY   dapagliflozin propanediol (FARXIGA) 10 MG TABS tablet Take 1 tablet (10 mg total) by mouth in the morning.   glucose blood test strip Use as instructed to blood sugar twice daily   levothyroxine (SYNTHROID) 112 MCG tablet TAKE 1 TABLET(112 MCG) BY MOUTH DAILY BEFORE AND BREAKFAST   lisinopril (ZESTRIL) 5 MG tablet Take 1 tablet (5 mg total) by mouth daily.   pantoprazole (PROTONIX) 40 MG tablet Take 1 tablet (40 mg total) by mouth daily as needed.   Semaglutide,0.25 or  0.5MG /DOS, (OZEMPIC, 0.25 OR 0.5 MG/DOSE,) 2 MG/3ML SOPN Inject 0.25 mg + 10 clicks weekly   triamcinolone cream (KENALOG) 0.1 % Apply 1 Application topically 2 (two) times daily.   vitamin B-12 (CYANOCOBALAMIN) 1000 MCG tablet Take 1,000 mcg by mouth daily.   No facility-administered encounter medications on file as of 09/23/2022.     Lab Results  Component Value Date   WBC 7.5 09/21/2022   HGB 12.8 09/21/2022   HCT 40.4 09/21/2022   PLT 109.0 (L) 09/21/2022   GLUCOSE 142 (H) 09/21/2022   CHOL 122 09/21/2022   TRIG 124.0 09/21/2022   HDL 39.60 09/21/2022   LDLDIRECT 64.0 12/08/2021   LDLCALC 58 09/21/2022   ALT 23 09/21/2022   AST 18 09/21/2022   NA 142 09/21/2022   K 5.0 09/21/2022   CL 109 09/21/2022   CREATININE 1.10 09/21/2022   BUN 18 09/21/2022   CO2 27 09/21/2022   TSH 5.13 06/13/2022   INR 0.87 08/07/2017   HGBA1C 7.2 (H) 09/21/2022   MICROALBUR 0.8 08/04/2021    No results found.     Assessment & Plan:  Hypercholesterolemia Assessment & Plan: Continue lipitor.  Follow lipid panel and liver function tests.  Continue lipitor.  Follow lipid panel and liver function tests.    Orders: -     Lipid panel; Future -     Hepatic function panel; Future -     Basic metabolic panel; Future  Type 2 diabetes mellitus with hyperglycemia, without long-term current use of insulin (HCC) Assessment & Plan: On ozempic and farxiga.  Discussed diet and exercise.  Tolerating.  Follow sugars.  Follow met b  and A1c.  No changes today.  Check and record sugars.  A1c improved.   Lab Results  Component Value Date   HGBA1C 7.2 (H) 09/21/2022     Orders: -     Hemoglobin A1c; Future  Health care maintenance Assessment & Plan: Physical today 09/23/22.  Mammogram 07/06/21 - Birads I.  Colonoscopy 09/2017 - recommended f/u in 5 years.  Discussed due colonoscopy.  Referral to GI placed.     Colon cancer screening Assessment & Plan: Colonoscopy 09/2017 - internal hemorrhoids and 2  3-62mm polyps.  Continue f/u with GI.   Orders: -     Ambulatory referral to Gastroenterology  Thrombocytopenia [facility]) Assessment & Plan: Follow cbc. Persistent decreased platelet count.  Previous echo - spleen normal.  Discussed further w/up.  Refer to hematology for further evaluation.   Orders: -     Ambulatory referral to Hematology / Oncology  Stress Assessment & Plan: Increased stress.  Discussed.  Does not feel needs any further intervention.  Follow.     Personal history of tobacco use, presenting hazards to health Assessment & Plan: Discussed the need to quit.  Desires not to quit at this time.  States - calms her down.  Follow.    Lung nodule Assessment & Plan: CT 05/11/21 - Lung-RADS 2S, benign appearance or behavior. Continue annual screening with low-dose chest CT without contrast in 12 months. Just had performed.  Results pending.    Hypothyroidism, unspecified type Assessment & Plan: On thyroid replacement.  Follow tsh.   Primary hypertension Assessment & Plan: Continue lisinopril.  Blood pressure doing well.  Follow pressures.  Follow metabolic panel.    Gastroesophageal reflux disease with esophagitis without hemorrhage Assessment & Plan: No upper symptoms reported. On protonix.    Carotid artery disease, unspecified laterality, unspecified type [facility]) Assessment & Plan: Carotid ultrasound 06/02/21:  Right Carotid: Velocities in the right ICA are consistent with a 1-39% stenosis.                Non-hemodynamically significant plaque <50% noted in the CCA. The                ECA appears <50% stenosed.   Left Carotid: There was no evidence of thrombus, dissection, atherosclerotic               plaque or stenosis in the cervical carotid system.  Continue lipitor.    Aortic atherosclerosis (HCC) Assessment & Plan: Continue lipitor.       Dale Lynn, MD

## 2022-09-23 NOTE — Assessment & Plan Note (Signed)
Carotid ultrasound 06/02/21:  Right Carotid: Velocities in the right ICA are consistent with a 1-39% stenosis.                Non-hemodynamically significant plaque <50% noted in the CCA. The                ECA appears <50% stenosed.   Left Carotid: There was no evidence of thrombus, dissection, atherosclerotic               plaque or stenosis in the cervical carotid system.  Continue lipitor.  

## 2022-09-23 NOTE — Assessment & Plan Note (Addendum)
Continue lipitor.  Follow lipid panel and liver function tests.  Continue lipitor.  Follow lipid panel and liver function tests.

## 2022-09-23 NOTE — Assessment & Plan Note (Signed)
Continue lipitor  ?

## 2022-09-23 NOTE — Assessment & Plan Note (Signed)
CT 05/11/21 - Lung-RADS 2S, benign appearance or behavior. Continue annual screening with low-dose chest CT without contrast in 12 months. Just had performed.  Results pending.

## 2022-09-23 NOTE — Assessment & Plan Note (Signed)
Continue lisinopril.  Blood pressure doing well.  Follow pressures.  Follow metabolic panel.  

## 2022-09-27 ENCOUNTER — Telehealth: Payer: Self-pay | Admitting: Acute Care

## 2022-09-27 NOTE — Telephone Encounter (Signed)
Call report confirmed and noted in LCS dashboard.  Results routed to provider for review.  IMPRESSION: 1. Lung-RADS 4A, suspicious. Follow up low-dose chest CT without contrast in 3 months (please use the following order, "CT CHEST LCS NODULE FOLLOW-UP W/O CM") is recommended. Alternatively, PET may be considered when there is a solid component 8mm or larger. New right lower lobe pulmonary nodule of volume derived equivalent diameter 7.5 mm. 2. Aortic atherosclerosis (ICD10-I70.0), coronary artery atherosclerosis and emphysema (ICD10-J43.9).

## 2022-09-27 NOTE — Telephone Encounter (Signed)
Call Report  

## 2022-09-28 ENCOUNTER — Encounter: Payer: Self-pay | Admitting: Oncology

## 2022-09-28 ENCOUNTER — Inpatient Hospital Stay: Payer: Medicare Other | Attending: Oncology | Admitting: Oncology

## 2022-09-28 ENCOUNTER — Inpatient Hospital Stay: Payer: Medicare Other

## 2022-09-28 VITALS — BP 139/59 | HR 63 | Temp 96.8°F | Resp 16 | Ht 62.0 in | Wt 131.8 lb

## 2022-09-28 DIAGNOSIS — D696 Thrombocytopenia, unspecified: Secondary | ICD-10-CM

## 2022-09-28 DIAGNOSIS — F17210 Nicotine dependence, cigarettes, uncomplicated: Secondary | ICD-10-CM

## 2022-09-28 DIAGNOSIS — Z808 Family history of malignant neoplasm of other organs or systems: Secondary | ICD-10-CM

## 2022-09-28 LAB — IRON AND TIBC
Iron: 108 ug/dL (ref 28–170)
Saturation Ratios: 32 % — ABNORMAL HIGH (ref 10.4–31.8)
TIBC: 336 ug/dL (ref 250–450)
UIBC: 228 ug/dL

## 2022-09-28 LAB — FERRITIN: Ferritin: 231 ng/mL (ref 11–307)

## 2022-09-28 LAB — CBC (CANCER CENTER ONLY)
HCT: 39 % (ref 36.0–46.0)
Hemoglobin: 12.3 g/dL (ref 12.0–15.0)
MCH: 27.6 pg (ref 26.0–34.0)
MCHC: 31.5 g/dL (ref 30.0–36.0)
MCV: 87.4 fL (ref 80.0–100.0)
Platelet Count: 113 10*3/uL — ABNORMAL LOW (ref 150–400)
RBC: 4.46 MIL/uL (ref 3.87–5.11)
RDW: 16.8 % — ABNORMAL HIGH (ref 11.5–15.5)
WBC Count: 6.8 10*3/uL (ref 4.0–10.5)
nRBC: 0 % (ref 0.0–0.2)

## 2022-09-28 LAB — FOLATE: Folate: 26 ng/mL (ref >5.9)

## 2022-09-28 LAB — VITAMIN B12: Vitamin B-12: 378 pg/mL (ref 180–914)

## 2022-09-28 NOTE — Progress Notes (Unsigned)
[facility]  Telephone:(336) [phone] Fax:(336) [phone]  ID: Ann Collins OB: [birth year]  MR#: [id number]  UXN#:[id number]  Patient Care Team: Dale Coffeen, MD as PCP - General (Internal Medicine) Iran Ouch, MD as PCP - Cardiology (Cardiology) Alden Hipp, RPH-CPP (Pharmacist)  CHIEF COMPLAINT: Thrombocytopenia.  INTERVAL HISTORY: Patient is a 77 year old female who was noted to have a chronic thrombocytopenia that slightly trending downward on routine blood work.  She currently feels well and is asymptomatic.  She has no neurologic complaints.  She denies any recent fevers or illnesses.  She has a good appetite and denies weight loss.  She has no chest pain, shortness of breath, cough, or hemoptysis.  She denies any nausea, vomiting, constipation, or diarrhea.  She has no urinary complaints.  Patient offers no specific complaints today.  REVIEW OF SYSTEMS:   Review of Systems  Constitutional: Negative.  Negative for fever, malaise/fatigue and weight loss.  Respiratory: Negative.  Negative for cough, hemoptysis and shortness of breath.   Cardiovascular: Negative.  Negative for chest pain and leg swelling.  Gastrointestinal: Negative.  Negative for abdominal pain.  Genitourinary: Negative.  Negative for dysuria.  Musculoskeletal: Negative.  Negative for back pain.  Skin: Negative.  Negative for rash.  Neurological: Negative.  Negative for dizziness, seizures, weakness and headaches.  Endo/Heme/Allergies:  Bruises/bleeds easily.    As per HPI. Otherwise, a complete review of systems is negative.  PAST MEDICAL HISTORY: Past Medical History:  Diagnosis Date   Abdominal hernia    CAD (coronary artery disease)    a. LHC 7/19: LM nl, p-mLAD 100% w/ right to left collaterals, OM2 90% (2 mm), 1st LPL 40%, medical management   Diabetes mellitus (HCC)    Diverticulosis    GERD (gastroesophageal reflux disease)    Hematuria    Hypercholesterolemia     Hypertension    Hypothyroidism    Kidney stones     PAST SURGICAL HISTORY: Past Surgical History:  Procedure Laterality Date   APPENDECTOMY     BREAST BIOPSY  1969   right   CHOLECYSTECTOMY  2005   COLONOSCOPY     COLONOSCOPY WITH PROPOFOL N/A 09/19/2017   Procedure: COLONOSCOPY WITH PROPOFOL;  Surgeon: Toledo, Boykin Nearing, MD;  Location: [facility] ENDOSCOPY;  Service: Gastroenterology;  Laterality: N/A;   ESOPHAGOGASTRODUODENOSCOPY (EGD) WITH PROPOFOL N/A 09/19/2017   Procedure: ESOPHAGOGASTRODUODENOSCOPY (EGD) WITH PROPOFOL;  Surgeon: Toledo, Boykin Nearing, MD;  Location: [facility];  Service: Gastroenterology;  Laterality: N/A;   LEFT HEART CATH AND CORONARY ANGIOGRAPHY Left 08/07/2017   Procedure: LEFT HEART CATH AND CORONARY ANGIOGRAPHY;  Surgeon: Iran Ouch, MD;  Location: [facility] CV LAB;  Service: Cardiovascular;  Laterality: Left;   UPPER GI ENDOSCOPY      FAMILY HISTORY: Family History  Problem Relation Age of Onset   Brain cancer Mother        history of brain tumor   Lung disease Father    Cancer Sister        Spindle cell carcinoma   Diabetes Brother        x2   Bipolar disorder Son    Alcohol abuse Son    Breast cancer Neg Hx    Colon cancer Neg Hx     ADVANCED DIRECTIVES (Y/N):  N  HEALTH MAINTENANCE: Social History   Tobacco Use   Smoking status: Every Day    Current packs/day: 1.00    Average packs/day: 1 pack/day for 50.0 years (50.0 ttl pk-yrs)  Types: Cigarettes   Smokeless tobacco: Never  Vaping Use   Vaping status: Never Used  Substance Use Topics   Alcohol use: No    Alcohol/week: 0.0 standard drinks of alcohol   Drug use: No     Colonoscopy:  PAP:  Bone density:  Lipid panel:  Allergies  Allergen Reactions   Pioglitazone Swelling   Macrobid [Nitrofurantoin Macrocrystal] Other (See Comments)    Body aches   Nitrofurantoin Other (See Comments)    Body aches    Current Outpatient Medications  Medication Sig Dispense  Refill   Accu-Chek Softclix Lancets lancets USE TO TEST BLOOD SUGAR FOUR TIMES DAILY     aspirin 81 MG tablet Take 81 mg by mouth daily.     atorvastatin (LIPITOR) 20 MG tablet TAKE 1 TABLET(20 MG) BY MOUTH DAILY 90 tablet 3   blood glucose meter kit and supplies Dispense based on patient and insurance preference. Use up to four times daily as directed. (FOR ICD-10 E10.9, E11.9). 1 each 3   carvedilol (COREG) 3.125 MG tablet TAKE 1 TABLET(3.125 MG) BY MOUTH TWICE DAILY 180 tablet 1   dapagliflozin propanediol (FARXIGA) 10 MG TABS tablet Take 1 tablet (10 mg total) by mouth in the morning. 90 tablet 3   glucose blood test strip Use as instructed to blood sugar twice daily 100 each 12   levothyroxine (SYNTHROID) 112 MCG tablet TAKE 1 TABLET(112 MCG) BY MOUTH DAILY BEFORE AND BREAKFAST 90 tablet 1   lisinopril (ZESTRIL) 5 MG tablet Take 1 tablet (5 mg total) by mouth daily. 90 tablet 1   pantoprazole (PROTONIX) 40 MG tablet Take 1 tablet (40 mg total) by mouth daily as needed. 90 tablet 1   Semaglutide,0.25 or 0.5MG /DOS, (OZEMPIC, 0.25 OR 0.5 MG/DOSE,) 2 MG/3ML SOPN Inject 0.25 mg + 10 clicks weekly 3 mL 11   triamcinolone cream (KENALOG) 0.1 % Apply 1 Application topically 2 (two) times daily. 30 g 0   vitamin B-12 (CYANOCOBALAMIN) 1000 MCG tablet Take 1,000 mcg by mouth daily.     No current facility-administered medications for this visit.    OBJECTIVE: Vitals:   09/28/22 1104  BP: (!) 139/59  Pulse: 63  Resp: 16  Temp: (!) 96.8 F (36 C)  SpO2: 100%     Body mass index is 24.11 kg/m.    ECOG FS:0 - Asymptomatic  General: Well-developed, well-nourished, no acute distress. Eyes: Pink conjunctiva, anicteric sclera. HEENT: Normocephalic, moist mucous membranes. Lungs: No audible wheezing or coughing. Heart: Regular rate and rhythm. Abdomen: Soft, nontender, no obvious distention. Musculoskeletal: No edema, cyanosis, or clubbing. Neuro: Alert, answering all questions appropriately.  Cranial nerves grossly intact. Skin: No rashes or petechiae noted.  Ecchymosis noted on bilateral forearms. Psych: Normal affect. Lymphatics: No cervical, calvicular, axillary or inguinal LAD.   LAB RESULTS:  Lab Results  Component Value Date   NA 142 09/21/2022   K 5.0 09/21/2022   CL 109 09/21/2022   CO2 27 09/21/2022   GLUCOSE 142 (H) 09/21/2022   BUN 18 09/21/2022   CREATININE 1.10 09/21/2022   CALCIUM 9.5 09/21/2022   PROT 6.4 09/21/2022   ALBUMIN 3.8 09/21/2022   AST 18 09/21/2022   ALT 23 09/21/2022   ALKPHOS 79 09/21/2022   BILITOT 0.4 09/21/2022   GFRNONAA 54 (L) 08/22/2017   GFRAA 62 08/22/2017    Lab Results  Component Value Date   WBC 7.5 09/21/2022   NEUTROABS 2.3 09/21/2022   HGB 12.8 09/21/2022   HCT 40.4 09/21/2022  MCV 87.5 09/21/2022   PLT 109.0 (L) 09/21/2022     STUDIES: CT CHEST LUNG CA SCREEN LOW DOSE W/O CM  Result Date: 09/27/2022 CLINICAL DATA:  55 pack-year smoking history/current smoker EXAM: CT CHEST WITHOUT CONTRAST LOW-DOSE FOR LUNG CANCER SCREENING TECHNIQUE: Multidetector CT imaging of the chest was performed following the standard protocol without IV contrast. RADIATION DOSE REDUCTION: This exam was performed according to the departmental dose-optimization program which includes automated exposure control, adjustment of the mA and/or kV according to patient size and/or use of iterative reconstruction technique. COMPARISON:  05/11/2021 FINDINGS: Cardiovascular: Bovine arch. Aortic atherosclerosis. Normal heart size, without pericardial effusion. Lad coronary artery calcification. Mediastinum/Nodes: No mediastinal or hilar adenopathy, given limitations of unenhanced CT. Lungs/Pleura: No pleural fluid. Mild centrilobular emphysema. A dependent right lower lobe nodule of volume derived equivalent diameter 7.5 mm is new, including on 173/3. Other solid and non solid pulmonary nodules are similar. Upper Abdomen: Cholecystectomy. Normal imaged  portions of the spleen, stomach, pancreas, left adrenal gland. Right adrenal thickening is similar and may represent hyperplasia. Bilateral upper pole low-density renal lesions are likely cysts including at up to 2.1 cm . In the absence of clinically indicated signs/symptoms require(s) no independent follow-up. Musculoskeletal: Osteopenia. IMPRESSION: 1. Lung-RADS 4A, suspicious. Follow up low-dose chest CT without contrast in 3 months (please use the following order, "CT CHEST LCS NODULE FOLLOW-UP W/O CM") is recommended. Alternatively, PET may be considered when there is a solid component 8mm or larger. New right lower lobe pulmonary nodule of volume derived equivalent diameter 7.5 mm. 2. Aortic atherosclerosis (ICD10-I70.0), coronary artery atherosclerosis and emphysema (ICD10-J43.9). These results will be called to the ordering clinician or representative by the Radiologist Assistant, and communication documented in the PACS or [employer]. Electronically Signed   By: Jeronimo Greaves M.D.   On: 09/27/2022 08:45    ASSESSMENT: Thrombocytopenia.  PLAN:    Thrombocytopenia: Upon review of patient's chart, patient has had a mild thrombocytopenia since June 2020 ranging between 109 and 142.  Today's result is 113.  Previous imaging did not reveal any splenomegaly.  Iron stores, B12, and folate levels are within normal limits.  Platelet antibodies, SPEP, and [employer] myeloid panel were drawn for completeness and are pending at time of dictation.  No intervention is needed at this time.  Patient does not require bone marrow biopsy.  Return to clinic in 3 weeks with video-assisted telemedicine visit for further evaluation and discussion of her laboratory results. Lung screening: Patient recently underwent low-dose CT scan for lung cancer screening which was reported as lung RADS 4A.  Recommendation is either repeat CT scan in 3 months or pursue PET scan.  Will defer to primary care.  I spent a total of 45  minutes reviewing chart data, face-to-face evaluation with the patient, counseling and coordination of care as detailed above.   Patient expressed understanding and was in agreement with this plan. She also understands that She can call clinic at any time with any questions, concerns, or complaints.    Jeralyn Ruths, MD   09/28/2022 11:28 AM

## 2022-09-29 ENCOUNTER — Telehealth: Payer: Self-pay | Admitting: Acute Care

## 2022-09-29 DIAGNOSIS — R911 Solitary pulmonary nodule: Secondary | ICD-10-CM

## 2022-09-29 DIAGNOSIS — Z87891 Personal history of nicotine dependence: Secondary | ICD-10-CM

## 2022-09-29 NOTE — Telephone Encounter (Signed)
I have called the patient with the results of her low-dose screening CT.  I explained that her scan was read as a lung RADS 4A, suspicious.  She has a 7.5 mm right lower lobe nodule that is new.  Plan will be for 14-month follow-up low-dose screening CT that will be due in November 2024.  Patient is in agreement with the above plan and understands she will get a call closer to the time to get this scheduled. We also discussed the finding of coronary artery disease and aortic atherosclerosis.  She is currently on statin therapy.  I have added her primary care physician to this result note to ensure that she is aware of that finding. Lung cancer screening team, please place order for 72-month follow-up low-dose screening CT in November 2024 and fax results to PCP.  Thanks so much

## 2022-09-29 NOTE — Telephone Encounter (Signed)
Results/ plans faxed to PCP. Order placed for 3 month nodule f/u CT.  

## 2022-09-30 LAB — PROTEIN ELECTROPHORESIS, SERUM
A/G Ratio: 1.3 (ref 0.7–1.7)
Albumin ELP: 3.5 g/dL (ref 2.9–4.4)
Alpha-1-Globulin: 0.2 g/dL (ref 0.0–0.4)
Alpha-2-Globulin: 0.7 g/dL (ref 0.4–1.0)
Beta Globulin: 0.9 g/dL (ref 0.7–1.3)
Gamma Globulin: 0.9 g/dL (ref 0.4–1.8)
Globulin, Total: 2.7 g/dL (ref 2.2–3.9)
Total Protein ELP: 6.2 g/dL (ref 6.0–8.5)

## 2022-09-30 LAB — PLATELET ANTIBODY PROFILE
Glycoprotein IV Antibody: NEGATIVE
HLA Ab Ser Ql EIA: NEGATIVE
IA/IIA Antibody: NEGATIVE
IB/IX Antibody: NEGATIVE
IIB/IIIA Antibody: NEGATIVE

## 2022-09-30 NOTE — Telephone Encounter (Signed)
See office note. LDL 53.  Pt without acute symptoms.  Continue risk factor mofication.

## 2022-10-17 ENCOUNTER — Inpatient Hospital Stay: Payer: Medicare Other | Attending: Oncology | Admitting: Oncology

## 2022-10-17 DIAGNOSIS — D696 Thrombocytopenia, unspecified: Secondary | ICD-10-CM | POA: Diagnosis not present

## 2022-10-17 NOTE — Progress Notes (Signed)
[facility]  [phone] [phone]  ID: Ann Collins OB: 1945-02-20  MR#: [id number]  CSN#:[id number]  Patient Care Team: Dale [place], MD as PCP - General (Internal Medicine) Iran Ouch, MD as PCP - Cardiology (Cardiology) Alden Hipp, RPH-CPP (Pharmacist)  I connected with Ann Collins on 10/17/22 at  2:45 PM EDT by video enabled telemedicine visit and verified that I am speaking with the correct person using two identifiers.   I discussed the limitations, risks, security and privacy concerns of performing an evaluation and management service by telemedicine and the availability of in-person appointments. I also discussed with the patient that there may be a patient responsible charge related to this service. The patient expressed understanding and agreed to proceed.   Other persons participating in the visit and their role in the encounter: Patient, MD.  Patient's location: Home. Provider's location: Clinic.  CHIEF COMPLAINT: Thrombocytopenia.  INTERVAL HISTORY: Patient agreed to video assisted telemedicine visit for further evaluation and discussion of her laboratory results.  She continues to feel well and remains asymptomatic. She has no neurologic complaints.  She denies any recent fevers or illnesses.  She has a good appetite and denies weight loss.  She has no chest pain, shortness of breath, cough, or hemoptysis.  She denies any nausea, vomiting, constipation, or diarrhea.  She has no urinary complaints.  Patient no specific complaints today.  REVIEW OF SYSTEMS:   Review of Systems  Constitutional: Negative.  Negative for fever, malaise/fatigue and weight loss.  Respiratory: Negative.  Negative for cough, hemoptysis and shortness of breath.   Cardiovascular: Negative.  Negative for chest pain and leg swelling.  Gastrointestinal: Negative.  Negative for abdominal pain.  Genitourinary: Negative.  Negative for  dysuria.  Musculoskeletal: Negative.  Negative for back pain.  Skin: Negative.  Negative for rash.  Neurological: Negative.  Negative for dizziness, seizures, weakness and headaches.  Endo/Heme/Allergies:  Bruises/bleeds easily.    As per HPI. Otherwise, a complete review of systems is negative.  PAST MEDICAL HISTORY: Past Medical History:  Diagnosis Date   Abdominal hernia    CAD (coronary artery disease)    a. LHC 7/19: LM nl, p-mLAD 100% w/ right to left collaterals, OM2 90% (2 mm), 1st LPL 40%, medical management   Diabetes mellitus (HCC)    Diverticulosis    GERD (gastroesophageal reflux disease)    Hematuria    Hypercholesterolemia    Hypertension    Hypothyroidism    Kidney stones     PAST SURGICAL HISTORY: Past Surgical History:  Procedure Laterality Date   APPENDECTOMY     BREAST BIOPSY  1969   right   CHOLECYSTECTOMY  2005   COLONOSCOPY     COLONOSCOPY WITH PROPOFOL N/A 09/19/2017   Procedure: COLONOSCOPY WITH PROPOFOL;  Surgeon: Toledo, Boykin Nearing, MD;  Location: [facility] ENDOSCOPY;  Service: Gastroenterology;  Laterality: N/A;   ESOPHAGOGASTRODUODENOSCOPY (EGD) WITH PROPOFOL N/A 09/19/2017   Procedure: ESOPHAGOGASTRODUODENOSCOPY (EGD) WITH PROPOFOL;  Surgeon: Toledo, Boykin Nearing, MD;  Location: [facility] ENDOSCOPY;  Service: Gastroenterology;  Laterality: N/A;   LEFT HEART CATH AND CORONARY ANGIOGRAPHY Left 08/07/2017   Procedure: LEFT HEART CATH AND CORONARY ANGIOGRAPHY;  Surgeon: Iran Ouch, MD;  Location: [facility] INVASIVE CV LAB;  Service: Cardiovascular;  Laterality: Left;   UPPER GI ENDOSCOPY      FAMILY HISTORY: Family History  Problem Relation Age of Onset   Brain cancer Mother        history of brain  tumor   Lung disease Father    Cancer Sister        Spindle cell carcinoma   Diabetes Brother        x2   Bipolar disorder Son    Alcohol abuse Son    Breast cancer Neg Hx    Colon cancer Neg Hx     ADVANCED DIRECTIVES (Y/N):  N  HEALTH  MAINTENANCE: Social History   Tobacco Use   Smoking status: Every Day    Current packs/day: 1.00    Average packs/day: 1 pack/day for 50.0 years (50.0 ttl pk-yrs)    Types: Cigarettes   Smokeless tobacco: Never  Vaping Use   Vaping status: Never Used  Substance Use Topics   Alcohol use: No    Alcohol/week: 0.0 standard drinks of alcohol   Drug use: No     Colonoscopy:  PAP:  Bone density:  Lipid panel:  Allergies  Allergen Reactions   Pioglitazone Swelling   Macrobid [Nitrofurantoin Macrocrystal] Other (See Comments)    Body aches   Nitrofurantoin Other (See Comments)    Body aches    Current Outpatient Medications  Medication Sig Dispense Refill   Accu-Chek Softclix Lancets lancets USE TO TEST BLOOD SUGAR FOUR TIMES DAILY     aspirin 81 MG tablet Take 81 mg by mouth daily.     atorvastatin (LIPITOR) 20 MG tablet TAKE 1 TABLET(20 MG) BY MOUTH DAILY 90 tablet 3   blood glucose meter kit and supplies Dispense based on patient and insurance preference. Use up to four times daily as directed. (FOR ICD-10 E10.9, E11.9). 1 each 3   carvedilol (COREG) 3.125 MG tablet TAKE 1 TABLET(3.125 MG) BY MOUTH TWICE DAILY 180 tablet 1   dapagliflozin propanediol (FARXIGA) 10 MG TABS tablet Take 1 tablet (10 mg total) by mouth in the morning. 90 tablet 3   glucose blood test strip Use as instructed to blood sugar twice daily 100 each 12   levothyroxine (SYNTHROID) 112 MCG tablet TAKE 1 TABLET(112 MCG) BY MOUTH DAILY BEFORE AND BREAKFAST 90 tablet 1   lisinopril (ZESTRIL) 5 MG tablet Take 1 tablet (5 mg total) by mouth daily. 90 tablet 1   pantoprazole (PROTONIX) 40 MG tablet Take 1 tablet (40 mg total) by mouth daily as needed. 90 tablet 1   Semaglutide,0.25 or 0.5MG /DOS, (OZEMPIC, 0.25 OR 0.5 MG/DOSE,) 2 MG/3ML SOPN Inject 0.25 mg + 10 clicks weekly 3 mL 11   triamcinolone cream (KENALOG) 0.1 % Apply 1 Application topically 2 (two) times daily. 30 g 0   vitamin B-12 (CYANOCOBALAMIN) 1000  MCG tablet Take 1,000 mcg by mouth daily.     No current facility-administered medications for this visit.    OBJECTIVE: There were no vitals filed for this visit.    There is no height or weight on file to calculate BMI.    ECOG FS:0 - Asymptomatic  General: Well-developed, well-nourished, no acute distress. HEENT: Normocephalic. Neuro: Alert, answering all questions appropriately. Cranial nerves grossly intact. Psych: Normal affect.  LAB RESULTS:  Lab Results  Component Value Date   NA 142 09/21/2022   K 5.0 09/21/2022   CL 109 09/21/2022   CO2 27 09/21/2022   GLUCOSE 142 (H) 09/21/2022   BUN 18 09/21/2022   CREATININE 1.10 09/21/2022   CALCIUM 9.5 09/21/2022   PROT 6.4 09/21/2022   ALBUMIN 3.8 09/21/2022   AST 18 09/21/2022   ALT 23 09/21/2022   ALKPHOS 79 09/21/2022   BILITOT 0.4 09/21/2022  GFRNONAA 54 (L) 08/22/2017   GFRAA 62 08/22/2017    Lab Results  Component Value Date   WBC 6.8 09/28/2022   NEUTROABS 2.3 09/21/2022   HGB 12.3 09/28/2022   HCT 39.0 09/28/2022   MCV 87.4 09/28/2022   PLT 113 (L) 09/28/2022     STUDIES: CT CHEST LUNG CA SCREEN LOW DOSE W/O CM  Result Date: 09/27/2022 CLINICAL DATA:  55 pack-year smoking history/current smoker EXAM: CT CHEST WITHOUT CONTRAST LOW-DOSE FOR LUNG CANCER SCREENING TECHNIQUE: Multidetector CT imaging of the chest was performed following the standard protocol without IV contrast. RADIATION DOSE REDUCTION: This exam was performed according to the departmental dose-optimization program which includes automated exposure control, adjustment of the mA and/or kV according to patient size and/or use of iterative reconstruction technique. COMPARISON:  05/11/2021 FINDINGS: Cardiovascular: Bovine arch. Aortic atherosclerosis. Normal heart size, without pericardial effusion. Lad coronary artery calcification. Mediastinum/Nodes: No mediastinal or hilar adenopathy, given limitations of unenhanced CT. Lungs/Pleura: No pleural  fluid. Mild centrilobular emphysema. A dependent right lower lobe nodule of volume derived equivalent diameter 7.5 mm is new, including on 173/3. Other solid and non solid pulmonary nodules are similar. Upper Abdomen: Cholecystectomy. Normal imaged portions of the spleen, stomach, pancreas, left adrenal gland. Right adrenal thickening is similar and may represent hyperplasia. Bilateral upper pole low-density renal lesions are likely cysts including at up to 2.1 cm . In the absence of clinically indicated signs/symptoms require(s) no independent follow-up. Musculoskeletal: Osteopenia. IMPRESSION: 1. Lung-RADS 4A, suspicious. Follow up low-dose chest CT without contrast in 3 months (please use the following order, "CT CHEST LCS NODULE FOLLOW-UP W/O CM") is recommended. Alternatively, PET may be considered when there is a solid component 8mm or larger. New right lower lobe pulmonary nodule of volume derived equivalent diameter 7.5 mm. 2. Aortic atherosclerosis (ICD10-I70.0), coronary artery atherosclerosis and emphysema (ICD10-J43.9). These results will be called to the ordering clinician or representative by the Radiologist Assistant, and communication documented in the PACS or [employer]. Electronically Signed   By: Jeronimo Greaves M.D.   On: 09/27/2022 08:45    ASSESSMENT: Thrombocytopenia.  PLAN:    Thrombocytopenia: Upon review of patient's chart, patient has had a mild thrombocytopenia since June 2020 ranging between 109 and 142.  Today's result is 113.  Previous imaging did not reveal any splenomegaly.  All of her other laboratory work was either negative or within normal limits.  No intervention is needed at this time.  After discussion with the patient, it was agreed upon that no further follow-up is necessary.  Continue to monitor platelets 1-2 times per year and if they decrease and remain persistently below 100 please refer patient back for further evaluation.  Lung screening: Patient recently  underwent low-dose CT scan for lung cancer screening which was reported as lung RADS 4A.  Recommendation is either repeat CT scan in 3 months or pursue PET scan.  Will defer to primary care.  I provided 20 minutes of face-to-face video visit time during this encounter which included chart review, counseling, and coordination of care as documented above.    Patient expressed understanding and was in agreement with this plan. She also understands that She can call clinic at any time with any questions, concerns, or complaints.    Jeralyn Ruths, MD   10/17/2022 3:04 PM

## 2022-10-19 LAB — INTELLIGEN MYELOID

## 2022-11-04 ENCOUNTER — Telehealth: Payer: Self-pay

## 2022-11-04 NOTE — Telephone Encounter (Signed)
Pt has picked up medication.  

## 2022-11-04 NOTE — Telephone Encounter (Signed)
Called and informed pt Ann Collins was ready for pick up. We received 5 boxes totaled.

## 2022-12-22 ENCOUNTER — Ambulatory Visit
Admission: RE | Admit: 2022-12-22 | Discharge: 2022-12-22 | Disposition: A | Discharge status: Home / Self Care | Payer: Medicare Other | Source: Ambulatory Visit | Attending: Acute Care | Admitting: Acute Care

## 2022-12-22 DIAGNOSIS — J432 Centrilobular emphysema: Secondary | ICD-10-CM | POA: Diagnosis not present

## 2022-12-22 DIAGNOSIS — R911 Solitary pulmonary nodule: Secondary | ICD-10-CM | POA: Insufficient documentation

## 2022-12-22 DIAGNOSIS — Z87891 Personal history of nicotine dependence: Secondary | ICD-10-CM | POA: Diagnosis not present

## 2023-01-10 ENCOUNTER — Other Ambulatory Visit (HOSPITAL_COMMUNITY): Payer: Self-pay

## 2023-01-10 ENCOUNTER — Telehealth: Payer: Self-pay

## 2023-01-10 NOTE — Telephone Encounter (Signed)
Patient agreed to E-Sign for Re-Enroll for 2025, Ozempic.  Sent App to [employer] and faxed provider portion to office.

## 2023-01-12 ENCOUNTER — Telehealth: Payer: Self-pay

## 2023-01-12 NOTE — Telephone Encounter (Signed)
Received provider portion, faxing to [employer]

## 2023-01-13 ENCOUNTER — Other Ambulatory Visit (HOSPITAL_COMMUNITY): Payer: Self-pay

## 2023-01-16 ENCOUNTER — Telehealth: Payer: Self-pay

## 2023-01-16 NOTE — Telephone Encounter (Signed)
LVM to call office back and review recent CT results.

## 2023-01-19 ENCOUNTER — Telehealth: Payer: Self-pay

## 2023-01-19 ENCOUNTER — Other Ambulatory Visit: Payer: Medicare Other

## 2023-01-19 ENCOUNTER — Other Ambulatory Visit: Payer: Self-pay

## 2023-01-19 ENCOUNTER — Other Ambulatory Visit (HOSPITAL_COMMUNITY): Payer: Self-pay

## 2023-01-19 DIAGNOSIS — E7800 Pure hypercholesterolemia, unspecified: Secondary | ICD-10-CM | POA: Diagnosis not present

## 2023-01-19 DIAGNOSIS — E1165 Type 2 diabetes mellitus with hyperglycemia: Secondary | ICD-10-CM | POA: Diagnosis not present

## 2023-01-19 DIAGNOSIS — Z87891 Personal history of nicotine dependence: Secondary | ICD-10-CM

## 2023-01-19 DIAGNOSIS — Z122 Encounter for screening for malignant neoplasm of respiratory organs: Secondary | ICD-10-CM

## 2023-01-19 LAB — LIPID PANEL
Cholesterol: 127 mg/dL (ref 0–200)
HDL: 39.3 mg/dL (ref >39.00)
LDL Cholesterol: 57 mg/dL (ref 0–99)
NonHDL: 87.74
Total CHOL/HDL Ratio: 3
Triglycerides: 155 mg/dL — ABNORMAL HIGH (ref 0.0–149.0)
VLDL: 31 mg/dL (ref 0.0–40.0)

## 2023-01-19 LAB — HEPATIC FUNCTION PANEL
ALT: 12 U/L (ref 0–35)
AST: 13 U/L (ref 0–37)
Albumin: 3.9 g/dL (ref 3.5–5.2)
Alkaline Phosphatase: 78 U/L (ref 39–117)
Bilirubin, Direct: 0.1 mg/dL (ref 0.0–0.3)
Total Bilirubin: 0.5 mg/dL (ref 0.2–1.2)
Total Protein: 6.6 g/dL (ref 6.0–8.3)

## 2023-01-19 LAB — BASIC METABOLIC PANEL
BUN: 19 mg/dL (ref 6–23)
CO2: 27 meq/L (ref 19–32)
Calcium: 9.5 mg/dL (ref 8.4–10.5)
Chloride: 107 meq/L (ref 96–112)
Creatinine, Ser: 1.34 mg/dL — ABNORMAL HIGH (ref 0.40–1.20)
GFR: 38.34 mL/min — ABNORMAL LOW (ref >60.00)
Glucose, Bld: 159 mg/dL — ABNORMAL HIGH (ref 70–99)
Potassium: 5.1 meq/L (ref 3.5–5.1)
Sodium: 141 meq/L (ref 135–145)

## 2023-01-19 LAB — HEMOGLOBIN A1C: Hgb A1c MFr Bld: 7.5 % — ABNORMAL HIGH (ref 4.6–6.5)

## 2023-01-19 NOTE — Telephone Encounter (Signed)
Spoke with patient and reviewed results. States she had already seen and reviewed them. Pt to repeat scan in 6 months. No questions.

## 2023-01-23 ENCOUNTER — Ambulatory Visit (INDEPENDENT_AMBULATORY_CARE_PROVIDER_SITE_OTHER): Payer: Medicare Other | Admitting: Internal Medicine

## 2023-01-23 ENCOUNTER — Encounter: Payer: Self-pay | Admitting: Internal Medicine

## 2023-01-23 VITALS — BP 138/70 | HR 66 | Temp 98.0°F | Resp 16 | Ht 62.0 in | Wt 134.8 lb

## 2023-01-23 DIAGNOSIS — K2100 Gastro-esophageal reflux disease with esophagitis, without bleeding: Secondary | ICD-10-CM

## 2023-01-23 DIAGNOSIS — F439 Reaction to severe stress, unspecified: Secondary | ICD-10-CM | POA: Diagnosis not present

## 2023-01-23 DIAGNOSIS — E1165 Type 2 diabetes mellitus with hyperglycemia: Secondary | ICD-10-CM | POA: Diagnosis not present

## 2023-01-23 DIAGNOSIS — Z87891 Personal history of nicotine dependence: Secondary | ICD-10-CM | POA: Diagnosis not present

## 2023-01-23 DIAGNOSIS — R911 Solitary pulmonary nodule: Secondary | ICD-10-CM

## 2023-01-23 DIAGNOSIS — D696 Thrombocytopenia, unspecified: Secondary | ICD-10-CM

## 2023-01-23 DIAGNOSIS — I700 Atherosclerosis of aorta: Secondary | ICD-10-CM | POA: Diagnosis not present

## 2023-01-23 DIAGNOSIS — R944 Abnormal results of kidney function studies: Secondary | ICD-10-CM | POA: Diagnosis not present

## 2023-01-23 DIAGNOSIS — E7800 Pure hypercholesterolemia, unspecified: Secondary | ICD-10-CM | POA: Diagnosis not present

## 2023-01-23 DIAGNOSIS — Z7985 Long-term (current) use of injectable non-insulin antidiabetic drugs: Secondary | ICD-10-CM

## 2023-01-23 DIAGNOSIS — E039 Hypothyroidism, unspecified: Secondary | ICD-10-CM | POA: Diagnosis not present

## 2023-01-23 DIAGNOSIS — Z7984 Long term (current) use of oral hypoglycemic drugs: Secondary | ICD-10-CM

## 2023-01-23 DIAGNOSIS — I10 Essential (primary) hypertension: Secondary | ICD-10-CM | POA: Diagnosis not present

## 2023-01-23 LAB — BASIC METABOLIC PANEL
BUN: 25 mg/dL — ABNORMAL HIGH (ref 6–23)
CO2: 25 meq/L (ref 19–32)
Calcium: 9.2 mg/dL (ref 8.4–10.5)
Chloride: 106 meq/L (ref 96–112)
Creatinine, Ser: 1.24 mg/dL — ABNORMAL HIGH (ref 0.40–1.20)
GFR: 42.07 mL/min — ABNORMAL LOW (ref >60.00)
Glucose, Bld: 137 mg/dL — ABNORMAL HIGH (ref 70–99)
Potassium: 5.1 meq/L (ref 3.5–5.1)
Sodium: 138 meq/L (ref 135–145)

## 2023-01-23 LAB — MICROALBUMIN / CREATININE URINE RATIO
Creatinine,U: 37.8 mg/dL
Microalb Creat Ratio: 2.4 mg/g (ref 0.0–30.0)
Microalb, Ur: 0.9 mg/dL (ref 0.0–1.9)

## 2023-01-23 LAB — HM DIABETES FOOT EXAM

## 2023-01-23 NOTE — Assessment & Plan Note (Signed)
Decreased recent lab check. Potassium upper limits of normal. Discussed. She has been eating a lot of bananas and oranges. Drinking diet drinks. Discussed avoiding foods with increased potassium. Also stay hydrated. Continue to avoid antiinflammatory medication.

## 2023-01-23 NOTE — Progress Notes (Signed)
Subjective:    Patient ID: Ann Collins, female    DOB: 08/09/1945, 77 y.o.   MRN: [id number]  Patient here for  Chief Complaint  Patient presents with   Medical Management of Chronic Issues    HPI Here to follow up regarding her diabetes, hypertension and hypercholesterolemia. Saw hematology 10/17/22 - f/u thrombocytopenia. Stable.  Released.  Recommended check cbc 1-2x/year. Had screening CT 12/22/22 - 7.6 mm subpleural nodule posterior right lower lobe is stable. Lung-RADS 3, probably benign findings. Short-term follow-up in 6 months is recommended with repeat low-dose chest CT without contrast. Reports she has discussed with pulmonary and they are scheduling. Increased stress - family stress - family medical issues. Does not feel she needs any further intervention at this time. Stays active. No chest pain or sob reported. Breathing stable. Continues to smoke.  With increased stress, unable to quit smoking. No abdominal pain or bowel change. Discussed mammogram. Wants to hold. Discussed labs. Has not been watching her diet. Drinking an increased amount of diet sodas.    Past Medical History:  Diagnosis Date   Abdominal hernia    CAD (coronary artery disease)    a. LHC 7/19: LM nl, p-mLAD 100% w/ right to left collaterals, OM2 90% (2 mm), 1st LPL 40%, medical management   Diabetes mellitus (HCC)    Diverticulosis    GERD (gastroesophageal reflux disease)    Hematuria    Hypercholesterolemia    Hypertension    Hypothyroidism    Kidney stones    Past Surgical History:  Procedure Laterality Date   APPENDECTOMY     BREAST BIOPSY  1969   right   CHOLECYSTECTOMY  2005   COLONOSCOPY     COLONOSCOPY WITH PROPOFOL N/A 09/19/2017   Procedure: COLONOSCOPY WITH PROPOFOL;  Surgeon: Toledo, Boykin Nearing, MD;  Location: [facility];  Service: Gastroenterology;  Laterality: N/A;   ESOPHAGOGASTRODUODENOSCOPY (EGD) WITH PROPOFOL N/A 09/19/2017   Procedure: ESOPHAGOGASTRODUODENOSCOPY (EGD)  WITH PROPOFOL;  Surgeon: Toledo, Boykin Nearing, MD;  Location: [facility] ENDOSCOPY;  Service: Gastroenterology;  Laterality: N/A;   LEFT HEART CATH AND CORONARY ANGIOGRAPHY Left 08/07/2017   Procedure: LEFT HEART CATH AND CORONARY ANGIOGRAPHY;  Surgeon: Iran Ouch, MD;  Location: [facility];  Service: Cardiovascular;  Laterality: Left;   UPPER GI ENDOSCOPY     Family History  Problem Relation Age of Onset   Brain cancer Mother        history of brain tumor   Lung disease Father    Cancer Sister        Spindle cell carcinoma   Diabetes Brother        x2   Bipolar disorder Son    Alcohol abuse Son    Breast cancer Neg Hx    Colon cancer Neg Hx    Social History   Socioeconomic History   Marital status: Married    Spouse name: Not on file   Number of children: Not on file   Years of education: Not on file   Highest education level: 12th grade  Occupational History   Not on file  Tobacco Use   Smoking status: Every Day    Current packs/day: 1.00    Average packs/day: 1 pack/day for 50.0 years (50.0 ttl pk-yrs)    Types: Cigarettes   Smokeless tobacco: Never  Vaping Use   Vaping status: Never Used  Substance and Sexual Activity   Alcohol use: No    Alcohol/week: 0.0 standard drinks of  alcohol   Drug use: No   Sexual activity: Not on file  Other Topics Concern   Not on file  Social History Narrative   Not on file   Social Drivers of Health   Financial Resource Strain: Low Risk  (01/20/2023)   Overall Financial Resource Strain (CARDIA)    Difficulty of Paying Living Expenses: Not very hard  Food Insecurity: No Food Insecurity (01/20/2023)   Hunger Vital Sign    Worried About Running Out of Food in the Last Year: Never true    Ran Out of Food in the Last Year: Never true  Transportation Needs: No Transportation Needs (01/20/2023)   PRAPARE - Administrator, Civil Service (Medical): No    Lack of Transportation (Non-Medical): No  Physical Activity:  Insufficiently Active (01/20/2023)   Exercise Vital Sign    Days of Exercise per Week: 3 days    Minutes of Exercise per Session: 40 min  Stress: No Stress Concern Present (01/20/2023)   [employer] of Occupational Health - Occupational Stress Questionnaire    Feeling of Stress : Only a little  Social Connections: Socially Integrated (01/20/2023)   Social Connection and Isolation Panel [NHANES]    Frequency of Communication with Friends and Family: More than three times a week    Frequency of Social Gatherings with Friends and Family: Once a week    Attends Religious Services: More than 4 times per year    Active Member of [employer] or Organizations: Yes    Attends Engineer, structural: More than 4 times per year    Marital Status: Married     Review of Systems  Constitutional:  Negative for appetite change and unexpected weight change.  HENT:  Negative for congestion and sinus pressure.   Respiratory:  Negative for cough and chest tightness.        Breathing stable.   Cardiovascular:  Negative for chest pain, palpitations and leg swelling.  Gastrointestinal:  Negative for abdominal pain, diarrhea, nausea and vomiting.  Genitourinary:  Negative for difficulty urinating and dysuria.  Musculoskeletal:  Negative for joint swelling and myalgias.  Skin:  Negative for color change and rash.  Neurological:  Negative for dizziness and headaches.  Psychiatric/Behavioral:  Negative for agitation and dysphoric mood.        Increased stress as outlined.        Objective:     BP 138/70   Pulse 66   Temp 98 F (36.7 C)   Resp 16   Ht 5\' 2"  (1.575 m)   Wt 134 lb 12.8 oz (61.1 kg)   LMP 03/10/1991   SpO2 99%   BMI 24.66 kg/m  Wt Readings from Last 3 Encounters:  01/23/23 134 lb 12.8 oz (61.1 kg)  09/28/22 131 lb 12.8 oz (59.8 kg)  09/23/22 129 lb 9.6 oz (58.8 kg)    Physical Exam Vitals reviewed.  Constitutional:      General: She is not in acute distress.     Appearance: Normal appearance.  HENT:     Head: Normocephalic and atraumatic.     Right Ear: External ear normal.     Left Ear: External ear normal.     Mouth/Throat:     Pharynx: No oropharyngeal exudate or posterior oropharyngeal erythema.  Eyes:     General: No scleral icterus.       Right eye: No discharge.        Left eye: No discharge.     Conjunctiva/sclera:  Conjunctivae normal.  Neck:     Thyroid: No thyromegaly.  Cardiovascular:     Rate and Rhythm: Normal rate and regular rhythm.  Pulmonary:     Effort: No respiratory distress.     Breath sounds: Normal breath sounds. No wheezing.  Abdominal:     General: Bowel sounds are normal.     Palpations: Abdomen is soft.     Tenderness: There is no abdominal tenderness.  Musculoskeletal:        General: No swelling or tenderness.     Cervical back: Neck supple. No tenderness.  Lymphadenopathy:     Cervical: No cervical adenopathy.  Skin:    Findings: No erythema or rash.  Neurological:     Mental Status: She is alert.  Psychiatric:        Mood and Affect: Mood normal.        Behavior: Behavior normal.      Outpatient Encounter Medications as of 01/23/2023  Medication Sig   Accu-Chek Softclix Lancets lancets USE TO TEST BLOOD SUGAR FOUR TIMES DAILY   aspirin 81 MG tablet Take 81 mg by mouth daily.   atorvastatin (LIPITOR) 20 MG tablet TAKE 1 TABLET(20 MG) BY MOUTH DAILY   blood glucose meter kit and supplies Dispense based on patient and insurance preference. Use up to four times daily as directed. (FOR ICD-10 E10.9, E11.9).   carvedilol (COREG) 3.125 MG tablet TAKE 1 TABLET(3.125 MG) BY MOUTH TWICE DAILY   dapagliflozin propanediol (FARXIGA) 10 MG TABS tablet Take 1 tablet (10 mg total) by mouth in the morning.   glucose blood test strip Use as instructed to blood sugar twice daily   levothyroxine (SYNTHROID) 112 MCG tablet TAKE 1 TABLET(112 MCG) BY MOUTH DAILY BEFORE AND BREAKFAST   lisinopril (ZESTRIL) 5 MG tablet  Take 1 tablet (5 mg total) by mouth daily.   pantoprazole (PROTONIX) 40 MG tablet Take 1 tablet (40 mg total) by mouth daily as needed.   Semaglutide,0.25 or 0.5MG /DOS, (OZEMPIC, 0.25 OR 0.5 MG/DOSE,) 2 MG/3ML SOPN Inject 0.25 mg + 10 clicks weekly   triamcinolone cream (KENALOG) 0.1 % Apply 1 Application topically 2 (two) times daily.   vitamin B-12 (CYANOCOBALAMIN) 1000 MCG tablet Take 1,000 mcg by mouth daily.   No facility-administered encounter medications on file as of 01/23/2023.     Lab Results  Component Value Date   WBC 6.8 09/28/2022   HGB 12.3 09/28/2022   HCT 39.0 09/28/2022   PLT 113 (L) 09/28/2022   GLUCOSE 159 (H) 01/19/2023   CHOL 127 01/19/2023   TRIG 155.0 (H) 01/19/2023   HDL 39.30 01/19/2023   LDLDIRECT 64.0 12/08/2021   LDLCALC 57 01/19/2023   ALT 12 01/19/2023   AST 13 01/19/2023   NA 141 01/19/2023   K 5.1 01/19/2023   CL 107 01/19/2023   CREATININE 1.34 (H) 01/19/2023   BUN 19 01/19/2023   CO2 27 01/19/2023   TSH 5.13 06/13/2022   INR 0.87 08/07/2017   HGBA1C 7.5 (H) 01/19/2023   MICROALBUR 0.8 08/04/2021    CT CHEST LCS NODULE F/U LOW DOSE WO CONTRAST Result Date: 01/16/2023 CLINICAL DATA:  Current 55 pack-year smoker. EXAM: CT CHEST WITHOUT CONTRAST FOR LUNG CANCER SCREENING NODULE FOLLOW-UP TECHNIQUE: Multidetector CT imaging of the chest was performed following the standard protocol without IV contrast. RADIATION DOSE REDUCTION: This exam was performed according to the departmental dose-optimization program which includes automated exposure control, adjustment of the mA and/or kV according to patient size and/or use  of iterative reconstruction technique. COMPARISON:  09/21/2022 and 05/11/2021. FINDINGS: Cardiovascular: Atherosclerotic calcification of the aorta, aortic valve and coronary arteries. Heart is at the upper limits of normal in size. No pericardial effusion. Mediastinum/Nodes: No pathologically enlarged mediastinal or axillary lymph nodes.  Hilar regions are difficult to definitively evaluate without IV contrast. Esophagus is grossly unremarkable. Lungs/Pleura: Mild centrilobular emphysema. Smoking related respiratory bronchiolitis. 7.6 mm subpleural nodule in the posterior right lower lobe (4/180), stable. Additional pulmonary nodules measure 12.9 mm or less in size, as before. No pleural fluid. Mild cylindrical bronchiectasis. Debris in the airway. Upper Abdomen: Cholecystectomy. 2.8 cm right adrenal nodule measures 6 Hounsfield units. No follow-up necessary. Thickening of the left adrenal gland. No follow-up necessary. Low-attenuation lesions in the kidneys. No specific follow-up necessary. Small hiatal hernia. Visualized portions of the liver, adrenal glands, kidneys, spleen, pancreas, stomach and bowel are otherwise grossly unremarkable. No upper abdominal adenopathy. Musculoskeletal: Degenerative changes in the spine. Flowing anterior osteophytosis in the thoracic spine. IMPRESSION: 1. 7.6 mm subpleural nodule posterior right lower lobe is stable. Lung-RADS 3, probably benign findings. Short-term follow-up in 6 months is recommended with repeat low-dose chest CT without contrast (please use the following order, "CT CHEST LCS NODULE FOLLOW-UP W/O CM"). These results will be called to the ordering clinician or representative by the Radiologist Assistant, and communication documented in the PACS or [employer]. 2. Mild cylindrical bronchiectasis. 3. Right adrenal adenoma. 4. Aortic atherosclerosis (ICD10-I70.0). Coronary artery calcification. 5.  Emphysema (ICD10-J43.9). Electronically Signed   By: Leanna Battles M.D.   On: 01/16/2023 08:39       Assessment & Plan:  Decreased GFR Assessment & Plan: Decreased recent lab check. Potassium upper limits of normal. Discussed. She has been eating a lot of bananas and oranges. Drinking diet drinks. Discussed avoiding foods with increased potassium. Also stay hydrated. Continue to avoid  antiinflammatory medication.   Orders: -     Basic metabolic panel  Aortic atherosclerosis (HCC) Assessment & Plan: Continue lipitor.    Type 2 diabetes mellitus with hyperglycemia, without long-term current use of insulin (HCC) Assessment & Plan: On ozempic and farxiga.  Discussed diet and exercise.  Tolerating.  Follow sugars.  Follow met b and A1c.  A1c increased some from last check - to 7.5. Discussed medication. Desires no change today.   Check and record sugars.   Lab Results  Component Value Date   HGBA1C 7.5 (H) 01/19/2023     Orders: -     Microalbumin / creatinine urine ratio  Gastroesophageal reflux disease with esophagitis without hemorrhage Assessment & Plan: No upper symptoms reported. On protonix.    Hypercholesterolemia Assessment & Plan: Continue lipitor.  Follow lipid panel and liver function tests.     Primary hypertension Assessment & Plan: Continue lisinopril.  Blood pressure as outlined. Increased above goal. Feels related to increased stress. Will spot check pressure.   Follow pressures.  Follow metabolic panel.    Hypothyroidism, unspecified type Assessment & Plan: On thyroid replacement.  Follow tsh.   Lung nodule Assessment & Plan: Had screening CT 12/22/22 - 7.6 mm subpleural nodule posterior right lower lobe is stable. Lung-RADS 3, probably benign findings. Short-term follow-up in 6 months is recommended with repeat low-dose chest CT without contrast. Reports she has discussed with pulmonary and they are scheduling.    Personal history of tobacco use, presenting hazards to health Assessment & Plan: Discussed the need to quit.  Desires not to quit at this time.  States -  calms her down.  Follow.    Stress Assessment & Plan: Increased stress.  Discussed.  Does not feel needs any further intervention.  Follow.     Thrombocytopenia [facility]) Assessment & Plan: Saw hematology 10/17/22 - f/u thrombocytopenia. Stable.  Released.  Recommended  check cbc 1-2x/year.      Dale Hammond, MD

## 2023-01-23 NOTE — Assessment & Plan Note (Signed)
Saw hematology 10/17/22 - f/u thrombocytopenia. Stable.  Released.  Recommended check cbc 1-2x/year.

## 2023-01-23 NOTE — Assessment & Plan Note (Signed)
Continue lisinopril.  Blood pressure as outlined. Increased above goal. Feels related to increased stress. Will spot check pressure.   Follow pressures.  Follow metabolic panel.

## 2023-01-23 NOTE — Assessment & Plan Note (Signed)
Discussed the need to quit.  Desires not to quit at this time.  States - calms her down.  Follow.

## 2023-01-23 NOTE — Assessment & Plan Note (Signed)
No upper symptoms reported.  On protonix.   

## 2023-01-23 NOTE — Assessment & Plan Note (Signed)
Had screening CT 12/22/22 - 7.6 mm subpleural nodule posterior right lower lobe is stable. Lung-RADS 3, probably benign findings. Short-term follow-up in 6 months is recommended with repeat low-dose chest CT without contrast. Reports she has discussed with pulmonary and they are scheduling.

## 2023-01-23 NOTE — Assessment & Plan Note (Signed)
Increased stress.  Discussed.  Does not feel needs any further intervention.  Follow.   

## 2023-01-23 NOTE — Assessment & Plan Note (Signed)
Continue lipitor  ?

## 2023-01-23 NOTE — Assessment & Plan Note (Signed)
On thyroid replacement.  Follow tsh.  

## 2023-01-23 NOTE — Assessment & Plan Note (Signed)
Continue lipitor.  Follow lipid panel and liver function tests.   

## 2023-01-23 NOTE — Assessment & Plan Note (Signed)
Carotid ultrasound 06/02/21:  Right Carotid: Velocities in the right ICA are consistent with a 1-39% stenosis.                Non-hemodynamically significant plaque <50% noted in the CCA. The                ECA appears <50% stenosed.   Left Carotid: There was no evidence of thrombus, dissection, atherosclerotic               plaque or stenosis in the cervical carotid system.  Continue lipitor.  

## 2023-01-23 NOTE — Assessment & Plan Note (Signed)
On ozempic and farxiga.  Discussed diet and exercise.  Tolerating.  Follow sugars.  Follow met b and A1c.  A1c increased some from last check - to 7.5. Discussed medication. Desires no change today.   Check and record sugars.   Lab Results  Component Value Date   HGBA1C 7.5 (H) 01/19/2023

## 2023-01-24 ENCOUNTER — Other Ambulatory Visit: Payer: Self-pay

## 2023-01-24 ENCOUNTER — Encounter: Payer: Self-pay | Admitting: Cardiovascular Disease

## 2023-01-24 ENCOUNTER — Ambulatory Visit: Payer: Medicare Other | Attending: Cardiovascular Disease | Admitting: Cardiovascular Disease

## 2023-01-24 VITALS — BP 144/60 | HR 70 | Ht 62.0 in | Wt 133.4 lb

## 2023-01-24 DIAGNOSIS — Z720 Tobacco use: Secondary | ICD-10-CM | POA: Diagnosis not present

## 2023-01-24 DIAGNOSIS — E785 Hyperlipidemia, unspecified: Secondary | ICD-10-CM | POA: Insufficient documentation

## 2023-01-24 DIAGNOSIS — I2510 Atherosclerotic heart disease of native coronary artery without angina pectoris: Secondary | ICD-10-CM | POA: Insufficient documentation

## 2023-01-24 DIAGNOSIS — I10 Essential (primary) hypertension: Secondary | ICD-10-CM | POA: Insufficient documentation

## 2023-01-24 NOTE — Patient Instructions (Signed)
 Medication Instructions:  No changes *If you need a refill on your cardiac medications before your next appointment, please call your pharmacy*   Lab Work: None ordered If you have labs (blood work) drawn today and your tests are completely normal, you will receive your results only by: MyChart Message (if you have MyChart) OR A paper copy in the mail If you have any lab test that is abnormal or we need to change your treatment, we will call you to review the results.   Testing/Procedures: None ordered   Follow-Up: At [facility], you and your health needs are our priority.  As part of our continuing mission to provide you with exceptional heart care, we have created designated Provider Care Teams.  These Care Teams include your primary Cardiologist (physician) and Advanced Practice Providers (APPs -  Physician Assistants and Nurse Practitioners) who all work together to provide you with the care you need, when you need it.  We recommend signing up for the patient portal called "MyChart".  Sign up information is provided on this After Visit Summary.  MyChart is used to connect with patients for Virtual Visits (Telemedicine).  Patients are able to view lab/test results, encounter notes, upcoming appointments, etc.  Non-urgent messages can be sent to your provider as well.   To learn more about what you can do with MyChart, go to [email or web address].    Your next appointment:   12 month(s)  Provider:   You may see Lorine Bears, MD or one of the following Advanced Practice Providers on your designated Care Team:   Nicolasa Ducking, NP Eula Listen, PA-C Cadence Fransico Michael, PA-C Charlsie Quest, NP Carlos Levering, NP

## 2023-01-24 NOTE — Progress Notes (Signed)
Cardiology Office Note   Date:  01/24/2023   ID:  CITLALLY OGANDO, DOB 09/26/1945, MRN [id number]  PCP:  Dale Arimo, MD  Cardiologist:   Lorine Bears, MD   Chief Complaint  Patient presents with   Follow-up    Patient denies new or acute cardiac problems/concerns today.         History of Present Illness: Ann Collins is a 77 y.o. female who is here today for follow-up visit regarding coronary artery disease. She has chronic medical conditions that include diabetes mellitus, hyperlipidemia,  tobacco use and hypertension. She has known history of esophageal erosions and dysphagia.   She was evaluated in June 2019 for chest pain and abnormal stress test which showed evidence of LAD ischemia with low normal ejection fraction. Cardiac catheterization in July 2019 showed left dominant system with severe two-vessel coronary artery disease.  The LAD was chronically occluded proximally with well-developed collaterals from the right coronary artery.  There was also 90% stenosis in OM 2 which was a 2 mm vessel.  Left ventricular angiography showed low normal LV systolic function with an EF of 50 to 55% with anterior and apical hypokinesis.    She was treated medically with no revascularization.  She has been doing reasonably well with no chest pain or shortness of breath.  She denies claudication.  She takes her medications regularly.  Unfortunately, she continues to smoke.      Past Medical History:  Diagnosis Date   Abdominal hernia    CAD (coronary artery disease)    a. LHC 7/19: LM nl, p-mLAD 100% w/ right to left collaterals, OM2 90% (2 mm), 1st LPL 40%, medical management   Diabetes mellitus (HCC)    Diverticulosis    GERD (gastroesophageal reflux disease)    Hematuria    Hypercholesterolemia    Hypertension    Hypothyroidism    Kidney stones     Past Surgical History:  Procedure Laterality Date   APPENDECTOMY     BREAST BIOPSY  1969   right    CHOLECYSTECTOMY  2005   COLONOSCOPY     COLONOSCOPY WITH PROPOFOL N/A 09/19/2017   Procedure: COLONOSCOPY WITH PROPOFOL;  Surgeon: Toledo, Boykin Nearing, MD;  Location: [facility] ENDOSCOPY;  Service: Gastroenterology;  Laterality: N/A;   ESOPHAGOGASTRODUODENOSCOPY (EGD) WITH PROPOFOL N/A 09/19/2017   Procedure: ESOPHAGOGASTRODUODENOSCOPY (EGD) WITH PROPOFOL;  Surgeon: Toledo, Boykin Nearing, MD;  Location: [facility] ENDOSCOPY;  Service: Gastroenterology;  Laterality: N/A;   LEFT HEART CATH AND CORONARY ANGIOGRAPHY Left 08/07/2017   Procedure: LEFT HEART CATH AND CORONARY ANGIOGRAPHY;  Surgeon: Iran Ouch, MD;  Location: [facility] INVASIVE CV LAB;  Service: Cardiovascular;  Laterality: Left;   UPPER GI ENDOSCOPY       Current Outpatient Medications  Medication Sig Dispense Refill   Accu-Chek Softclix Lancets lancets USE TO TEST BLOOD SUGAR FOUR TIMES DAILY     aspirin 81 MG tablet Take 81 mg by mouth daily.     atorvastatin (LIPITOR) 20 MG tablet TAKE 1 TABLET(20 MG) BY MOUTH DAILY 90 tablet 3   blood glucose meter kit and supplies Dispense based on patient and insurance preference. Use up to four times daily as directed. (FOR ICD-10 E10.9, E11.9). 1 each 3   carvedilol (COREG) 3.125 MG tablet TAKE 1 TABLET(3.125 MG) BY MOUTH TWICE DAILY 180 tablet 1   dapagliflozin propanediol (FARXIGA) 10 MG TABS tablet Take 1 tablet (10 mg total) by mouth in the morning. 90 tablet 3  glucose blood test strip Use as instructed to blood sugar twice daily 100 each 12   levothyroxine (SYNTHROID) 112 MCG tablet TAKE 1 TABLET(112 MCG) BY MOUTH DAILY BEFORE AND BREAKFAST 90 tablet 1   lisinopril (ZESTRIL) 5 MG tablet Take 1 tablet (5 mg total) by mouth daily. 90 tablet 1   pantoprazole (PROTONIX) 40 MG tablet Take 1 tablet (40 mg total) by mouth daily as needed. 90 tablet 1   Semaglutide,0.25 or 0.5MG /DOS, (OZEMPIC, 0.25 OR 0.5 MG/DOSE,) 2 MG/3ML SOPN Inject 0.25 mg + 10 clicks weekly 3 mL 11   triamcinolone cream (KENALOG) 0.1 %  Apply 1 Application topically 2 (two) times daily. 30 g 0   vitamin B-12 (CYANOCOBALAMIN) 1000 MCG tablet Take 1,000 mcg by mouth daily.     No current facility-administered medications for this visit.    Allergies:   Pioglitazone, Macrobid [nitrofurantoin macrocrystal], and Nitrofurantoin    Social History:  The patient  reports that she has been smoking cigarettes. She has a 50 pack-year smoking history. She has never used smokeless tobacco. She reports that she does not drink alcohol and does not use drugs.   Family History:  The patient's family history includes Alcohol abuse in her son; Bipolar disorder in her son; Brain cancer in her mother; Cancer in her sister; Diabetes in her brother; Lung disease in her father.    ROS:  Please see the history of present illness.   Otherwise, review of systems are positive for none.   All other systems are reviewed and negative.    PHYSICAL EXAM: VS:  BP (!) 144/60 (BP Location: Left Arm, Patient Position: Sitting, Cuff Size: Normal)   Pulse 70   Ht 5\' 2"  (1.575 m)   Wt 133 lb 6.4 oz (60.5 kg)   LMP 03/10/1991   SpO2 98%   BMI 24.40 kg/m  , BMI Body mass index is 24.4 kg/m. GEN: Well nourished, well developed, in no acute distress  HEENT: normal  Neck: no JVD, or masses.  Right carotid bruit Cardiac: RRR; no murmurs, rubs, or gallops,no edema  Respiratory:  clear to auscultation bilaterally, normal work of breathing GI: soft, nontender, nondistended, + BS MS: no deformity or atrophy  Skin: warm and dry, no rash Neuro:  Strength and sensation are intact Psych: euthymic mood, full affect Distal pulses are palpable. Radial and brachial pulses: Very diminished on the right side and normal on the left.   EKG:  EKG is ordered today. The ekg ordered today demonstrates : Normal sinus rhythm Nonspecific T wave abnormality When compared with ECG of 10-Jul-2017 13:43, Nonspecific T wave abnormality has replaced inverted T waves in  Anterolateral leads      Recent Labs: 06/13/2022: TSH 5.13 09/28/2022: Hemoglobin 12.3; Platelet Count 113 01/19/2023: ALT 12 01/23/2023: BUN 25; Creatinine, Ser 1.24; Potassium 5.1; Sodium 138    Lipid Panel    Component Value Date/Time   CHOL 127 01/19/2023 0903   CHOL 134 10/18/2017 1447   TRIG 155.0 (H) 01/19/2023 0903   HDL 39.30 01/19/2023 0903   HDL 41 10/18/2017 1447   CHOLHDL 3 01/19/2023 0903   VLDL 31.0 01/19/2023 0903   LDLCALC 57 01/19/2023 0903   LDLCALC 63 10/18/2017 1447   LDLDIRECT 64.0 12/08/2021 0755      Wt Readings from Last 3 Encounters:  01/24/23 133 lb 6.4 oz (60.5 kg)  01/23/23 134 lb 12.8 oz (61.1 kg)  09/28/22 131 lb 12.8 oz (59.8 kg)  07/21/2017    8:06 AM  PAD Screen  Previous PAD dx? No  Previous surgical procedure? No  Pain with walking? No  Feet/toe relief with dangling? No  Painful, non-healing ulcers? No  Extremities discolored? No      ASSESSMENT AND PLAN:  1.  Coronary artery disease involving native coronary arteries without angina: She is overall doing well with no anginal symptoms.  I recommend continuing medical therapy.  2.  Hyperlipidemia: I reviewed most recent lipid profile done recently which showed an LDL of 57.  Continue treatment with atorvastatin.  3.  Tobacco use: I again discussed with her the importance of smoking cessation.  She reports inability to quit due to a stressful situation at home.  4.  Essential hypertension: Blood pressure is reasonably controlled.  5.  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 Fortunately, she is asymptomatic.  Recommend continuing medical therapy.  Blood pressure should always be checked in the left arm as it reflects more accurately central aortic pressure.  6.  Right carotid bruit: Carotid Doppler in 2023 showed mild nonobstructive disease.  No need to repeat at this time.    Disposition:   FU with  me in 12 months   Signed,  Lorine Bears, MD 01/24/23 [facility] [place], [place] [phone]

## 2023-01-26 DIAGNOSIS — Z23 Encounter for immunization: Secondary | ICD-10-CM | POA: Diagnosis not present

## 2023-01-27 ENCOUNTER — Telehealth: Payer: Self-pay

## 2023-01-27 NOTE — Telephone Encounter (Signed)
Patient assistance packet placed in basket for your completion.

## 2023-01-31 NOTE — Telephone Encounter (Signed)
Per chart review, copy has been scanned in chart or signed prescribers section

## 2023-02-15 DIAGNOSIS — Z860100 Personal history of colon polyps, unspecified: Secondary | ICD-10-CM | POA: Diagnosis not present

## 2023-02-15 DIAGNOSIS — K5909 Other constipation: Secondary | ICD-10-CM | POA: Diagnosis not present

## 2023-02-15 DIAGNOSIS — K219 Gastro-esophageal reflux disease without esophagitis: Secondary | ICD-10-CM | POA: Diagnosis not present

## 2023-02-21 ENCOUNTER — Other Ambulatory Visit (INDEPENDENT_AMBULATORY_CARE_PROVIDER_SITE_OTHER): Payer: Medicare Other

## 2023-02-21 DIAGNOSIS — I10 Essential (primary) hypertension: Secondary | ICD-10-CM

## 2023-02-21 LAB — BASIC METABOLIC PANEL
BUN: 18 mg/dL (ref 6–23)
CO2: 28 meq/L (ref 19–32)
Calcium: 9.3 mg/dL (ref 8.4–10.5)
Chloride: 103 meq/L (ref 96–112)
Creatinine, Ser: 1.21 mg/dL — ABNORMAL HIGH (ref 0.40–1.20)
GFR: 43.3 mL/min — ABNORMAL LOW (ref >60.00)
Glucose, Bld: 151 mg/dL — ABNORMAL HIGH (ref 70–99)
Potassium: 4.9 meq/L (ref 3.5–5.1)
Sodium: 138 meq/L (ref 135–145)

## 2023-03-14 NOTE — Progress Notes (Signed)
 Pharmacy Medication Assistance Program Note    03/14/2023  Patient ID: Ann Collins, female   DOB: [birth year], 78 y.o.   MRN: [id number]     01/10/2023  Outreach Medication One  Initial Outreach Date (Medication One) 01/03/2023  Manufacturer Medication One Novo Nordisk  [facility] Ozempic   Dose of Ozempic  0.25 or 0.5 mg weekly  Type of Assistance Manufacturer Assistance  Date Application Sent to Patient 01/10/2023  Date Application Sent to Prescriber 01/10/2023  Patient Assistance Determination Approved  Approval End Date 02/07/2024

## 2023-04-08 ENCOUNTER — Other Ambulatory Visit: Payer: Self-pay | Admitting: Internal Medicine

## 2023-04-13 ENCOUNTER — Telehealth: Payer: Self-pay

## 2023-04-13 NOTE — Telephone Encounter (Signed)
 Patient assistance medication received: Ozempic.  Patient has been notified.

## 2023-04-17 NOTE — Telephone Encounter (Signed)
 Medication has been picked up (4 boxes)

## 2023-04-21 ENCOUNTER — Telehealth: Payer: Self-pay | Admitting: Internal Medicine

## 2023-04-21 DIAGNOSIS — I10 Essential (primary) hypertension: Secondary | ICD-10-CM

## 2023-04-21 DIAGNOSIS — E7800 Pure hypercholesterolemia, unspecified: Secondary | ICD-10-CM

## 2023-04-21 DIAGNOSIS — E039 Hypothyroidism, unspecified: Secondary | ICD-10-CM

## 2023-04-21 DIAGNOSIS — E1165 Type 2 diabetes mellitus with hyperglycemia: Secondary | ICD-10-CM

## 2023-04-21 DIAGNOSIS — D696 Thrombocytopenia, unspecified: Secondary | ICD-10-CM

## 2023-04-21 NOTE — Telephone Encounter (Signed)
 Patient need lab orders.

## 2023-04-22 NOTE — Telephone Encounter (Signed)
 Orders placed for labs

## 2023-04-22 NOTE — Addendum Note (Signed)
 Addended by: Charm Barges on: 04/22/2023 11:03 AM   Modules accepted: Orders

## 2023-04-26 ENCOUNTER — Other Ambulatory Visit: Payer: Medicare Other

## 2023-04-26 DIAGNOSIS — D696 Thrombocytopenia, unspecified: Secondary | ICD-10-CM | POA: Diagnosis not present

## 2023-04-26 DIAGNOSIS — E1165 Type 2 diabetes mellitus with hyperglycemia: Secondary | ICD-10-CM | POA: Diagnosis not present

## 2023-04-26 DIAGNOSIS — E7800 Pure hypercholesterolemia, unspecified: Secondary | ICD-10-CM

## 2023-04-26 DIAGNOSIS — E039 Hypothyroidism, unspecified: Secondary | ICD-10-CM

## 2023-04-26 LAB — CBC WITH DIFFERENTIAL/PLATELET
Basophils Absolute: 0 10*3/uL (ref 0.0–0.1)
Basophils Relative: 0.6 % (ref 0.0–3.0)
Eosinophils Absolute: 0.3 10*3/uL (ref 0.0–0.7)
Eosinophils Relative: 3.9 % (ref 0.0–5.0)
HCT: 41.2 % (ref 36.0–46.0)
Hemoglobin: 13.2 g/dL (ref 12.0–15.0)
Lymphocytes Relative: 59.8 % — ABNORMAL HIGH (ref 12.0–46.0)
Lymphs Abs: 3.9 10*3/uL (ref 0.7–4.0)
MCHC: 32 g/dL (ref 30.0–36.0)
MCV: 87.5 fl (ref 78.0–100.0)
Monocytes Absolute: 0.3 10*3/uL (ref 0.1–1.0)
Monocytes Relative: 4.2 % (ref 3.0–12.0)
Neutro Abs: 2.1 10*3/uL (ref 1.4–7.7)
Neutrophils Relative %: 31.5 % — ABNORMAL LOW (ref 43.0–77.0)
Platelets: 102 10*3/uL — ABNORMAL LOW (ref 150.0–400.0)
RBC: 4.71 Mil/uL (ref 3.87–5.11)
RDW: 16.9 % — ABNORMAL HIGH (ref 11.5–15.5)
WBC: 6.5 10*3/uL (ref 4.0–10.5)

## 2023-04-26 LAB — LIPID PANEL
Cholesterol: 123 mg/dL (ref 0–200)
HDL: 41.8 mg/dL (ref >39.00)
LDL Cholesterol: 57 mg/dL (ref 0–99)
NonHDL: 81.6
Total CHOL/HDL Ratio: 3
Triglycerides: 121 mg/dL (ref 0.0–149.0)
VLDL: 24.2 mg/dL (ref 0.0–40.0)

## 2023-04-26 LAB — BASIC METABOLIC PANEL
BUN: 19 mg/dL (ref 6–23)
CO2: 27 meq/L (ref 19–32)
Calcium: 8.9 mg/dL (ref 8.4–10.5)
Chloride: 107 meq/L (ref 96–112)
Creatinine, Ser: 1.04 mg/dL (ref 0.40–1.20)
GFR: 51.87 mL/min — ABNORMAL LOW (ref >60.00)
Glucose, Bld: 142 mg/dL — ABNORMAL HIGH (ref 70–99)
Potassium: 4.1 meq/L (ref 3.5–5.1)
Sodium: 140 meq/L (ref 135–145)

## 2023-04-26 LAB — HEPATIC FUNCTION PANEL
ALT: 15 U/L (ref 0–35)
AST: 14 U/L (ref 0–37)
Albumin: 3.8 g/dL (ref 3.5–5.2)
Alkaline Phosphatase: 85 U/L (ref 39–117)
Bilirubin, Direct: 0.1 mg/dL (ref 0.0–0.3)
Total Bilirubin: 0.4 mg/dL (ref 0.2–1.2)
Total Protein: 6 g/dL (ref 6.0–8.3)

## 2023-04-26 LAB — HEMOGLOBIN A1C: Hgb A1c MFr Bld: 7.8 % — ABNORMAL HIGH (ref 4.6–6.5)

## 2023-04-26 LAB — TSH: TSH: 2.09 u[IU]/mL (ref 0.35–5.50)

## 2023-04-28 ENCOUNTER — Ambulatory Visit (INDEPENDENT_AMBULATORY_CARE_PROVIDER_SITE_OTHER): Payer: Medicare Other | Admitting: Internal Medicine

## 2023-04-28 ENCOUNTER — Encounter: Payer: Self-pay | Admitting: Internal Medicine

## 2023-04-28 VITALS — BP 130/70 | HR 72 | Temp 98.0°F | Resp 16 | Ht 62.0 in | Wt 131.6 lb

## 2023-04-28 DIAGNOSIS — E1165 Type 2 diabetes mellitus with hyperglycemia: Secondary | ICD-10-CM

## 2023-04-28 DIAGNOSIS — R911 Solitary pulmonary nodule: Secondary | ICD-10-CM | POA: Diagnosis not present

## 2023-04-28 DIAGNOSIS — K2100 Gastro-esophageal reflux disease with esophagitis, without bleeding: Secondary | ICD-10-CM

## 2023-04-28 DIAGNOSIS — I700 Atherosclerosis of aorta: Secondary | ICD-10-CM

## 2023-04-28 DIAGNOSIS — E039 Hypothyroidism, unspecified: Secondary | ICD-10-CM | POA: Diagnosis not present

## 2023-04-28 DIAGNOSIS — R7989 Other specified abnormal findings of blood chemistry: Secondary | ICD-10-CM | POA: Diagnosis not present

## 2023-04-28 DIAGNOSIS — E7800 Pure hypercholesterolemia, unspecified: Secondary | ICD-10-CM | POA: Diagnosis not present

## 2023-04-28 DIAGNOSIS — Z7985 Long-term (current) use of injectable non-insulin antidiabetic drugs: Secondary | ICD-10-CM | POA: Diagnosis not present

## 2023-04-28 DIAGNOSIS — I779 Disorder of arteries and arterioles, unspecified: Secondary | ICD-10-CM | POA: Diagnosis not present

## 2023-04-28 DIAGNOSIS — F439 Reaction to severe stress, unspecified: Secondary | ICD-10-CM | POA: Diagnosis not present

## 2023-04-28 DIAGNOSIS — D696 Thrombocytopenia, unspecified: Secondary | ICD-10-CM

## 2023-04-28 DIAGNOSIS — I10 Essential (primary) hypertension: Secondary | ICD-10-CM | POA: Diagnosis not present

## 2023-04-28 NOTE — Progress Notes (Signed)
 Subjective:    Patient ID: JAILENE CUPIT, female    DOB: [birth year], 78 y.o.   MRN: [id number]  Patient here for  Chief Complaint  Patient presents with   Medical Management of Chronic Issues    HPI Here for a scheduled follow up - follow up regarding her diabetes, hypertension and hypercholesterolemia. Saw hematology 10/17/22 - f/u thrombocytopenia. Stable.  Released.  Recommended check cbc 1-2x/year. Had screening CT 12/22/22 - 7.6 mm subpleural nodule posterior right lower lobe is stable. Lung-RADS 3, probably benign findings. Short-term follow-up in 6 months is recommended with repeat low-dose chest CT without contrast. Follow up chest CT - 12/22/22 - stable. Recommended 6 month f/u. Breathing is stable. Discussed recent labs. A1c increased - 7.8. she increased ozempic to .5mg  three weeks ago. Discussed diet and exercise. No chest pain. No abdominal pain. No nausea or vomiting reported. Bowels stable.   Right adrenal adenoma.  Past Medical History:  Diagnosis Date   Abdominal hernia    CAD (coronary artery disease)    a. LHC 7/19: LM nl, p-mLAD 100% w/ right to left collaterals, OM2 90% (2 mm), 1st LPL 40%, medical management   Diabetes mellitus (HCC)    Diverticulosis    GERD (gastroesophageal reflux disease)    Hematuria    Hypercholesterolemia    Hypertension    Hypothyroidism    Kidney stones    Past Surgical History:  Procedure Laterality Date   APPENDECTOMY     BREAST BIOPSY  1969   right   CHOLECYSTECTOMY  2005   COLONOSCOPY     COLONOSCOPY WITH PROPOFOL N/A 09/19/2017   Procedure: COLONOSCOPY WITH PROPOFOL;  Surgeon: Toledo, Boykin Nearing, MD;  Location: [facility] END[facility]COPY;  Service: Gastroenterology;  Laterality: N/A;   ESOPHAGOGASTRODUODENOSCOPY (EGD) WITH PROPOFOL N/A 09/19/2017   Procedure: ESOPHAGOGASTRODUODENOSCOPY (EGD) WITH PROPOFOL;  Surgeon: Toledo, Boykin Nearing, MD;  Location: [facility];  Service: Gastroenterology;  Laterality: N/A;   LEFT HEART CATH AND  CORONARY ANGIOGRAPHY Left 08/07/2017   Procedure: LEFT HEART CATH AND CORONARY ANGIOGRAPHY;  Surgeon: Iran Ouch, MD;  Location: [facility] INVASIVE CV LAB;  Service: Cardiovascular;  Laterality: Left;   UPPER GI ENDOSCOPY     Family History  Problem Relation Age of Onset   Brain cancer Mother        history of brain tumor   Lung disease Father    Cancer Sister        Spindle cell carcinoma   Diabetes Brother        x2   Bipolar disorder Son    Alcohol abuse Son    Breast cancer Neg Hx    Colon cancer Neg Hx    Social History   Socioeconomic History   Marital status: Married    Spouse name: Not on file   Number of children: Not on file   Years of education: Not on file   Highest education level: 12th grade  Occupational History   Not on file  Tobacco Use   Smoking status: Every Day    Current packs/day: 1.00    Average packs/day: 1 pack/day for 50.0 years (50.0 ttl pk-yrs)    Types: Cigarettes   Smokeless tobacco: Never  Vaping Use   Vaping status: Never Used  Substance and Sexual Activity   Alcohol use: No    Alcohol/week: 0.0 standard drinks of alcohol   Drug use: No   Sexual activity: Not on file  Other Topics Concern   Not on file  Social History Narrative   Not on file   Social Drivers of Health   Financial Resource Strain: Medium Risk (02/15/2023)   Received from [facility]   Overall Financial Resource Strain (CARDIA)    Difficulty of Paying Living Expenses: Somewhat hard  Food Insecurity: No Food Insecurity (02/15/2023)   Received from [facility]   Hunger Vital Sign    Worried About Running Out of Food in the Last Year: Never true    Ran Out of Food in the Last Year: Never true  Transportation Needs: No Transportation Needs (02/15/2023)   Received from [facility] - Transportation    In the past 12 months, has lack of transportation kept you from medical appointments or from getting  medications?: No    Lack of Transportation (Non-Medical): No  Physical Activity: Insufficiently Active (01/20/2023)   Exercise Vital Sign    Days of Exercise per Week: 3 days    Minutes of Exercise per Session: 40 min  Stress: No Stress Concern Present (01/20/2023)   [employer] of Occupational Health - Occupational Stress Questionnaire    Feeling of Stress : Only a little  Social Connections: Socially Integrated (01/20/2023)   Social Connection and Isolation Panel [NHANES]    Frequency of Communication with Friends and Family: More than three times a week    Frequency of Social Gatherings with Friends and Family: Once a week    Attends Religious Services: More than 4 times per year    Active Member of [employer] or Organizations: Yes    Attends Engineer, structural: More than 4 times per year    Marital Status: Married     Review of Systems  Constitutional:  Negative for appetite change and unexpected weight change.  HENT:  Negative for congestion and sinus pressure.   Respiratory:  Negative for cough, chest tightness and shortness of breath.   Cardiovascular:  Negative for chest pain, palpitations and leg swelling.  Gastrointestinal:  Negative for abdominal pain, nausea and vomiting.  Genitourinary:  Negative for difficulty urinating and dysuria.  Musculoskeletal:  Negative for joint swelling and myalgias.  Skin:  Negative for color change and rash.  Neurological:  Negative for dizziness and headaches.  Psychiatric/Behavioral:  Negative for agitation and dysphoric mood.        Objective:     BP 130/70   Pulse 72   Temp 98 F (36.7 C)   Resp 16   Ht 5\' 2"  (1.575 m)   Wt 131 lb 9.6 oz (59.7 kg)   LMP 03/10/1991   SpO2 98%   BMI 24.07 kg/m  Wt Readings from Last 3 Encounters:  04/28/23 131 lb 9.6 oz (59.7 kg)  01/24/23 133 lb 6.4 oz (60.5 kg)  01/23/23 134 lb 12.8 oz (61.1 kg)    Physical Exam Vitals reviewed.  Constitutional:      General: She is not  in acute distress.    Appearance: Normal appearance.  HENT:     Head: Normocephalic and atraumatic.     Right Ear: External ear normal.     Left Ear: External ear normal.     Mouth/Throat:     Pharynx: No oropharyngeal exudate or posterior oropharyngeal erythema.  Eyes:     General: No scleral icterus.       Right eye: No discharge.        Left eye: No discharge.     Conjunctiva/sclera: Conjunctivae normal.  Neck:  Thyroid: No thyromegaly.  Cardiovascular:     Rate and Rhythm: Normal rate and regular rhythm.  Pulmonary:     Effort: No respiratory distress.     Breath sounds: Normal breath sounds. No wheezing.  Abdominal:     General: Bowel sounds are normal.     Palpations: Abdomen is soft.     Tenderness: There is no abdominal tenderness.  Musculoskeletal:        General: No swelling or tenderness.     Cervical back: Neck supple. No tenderness.  Lymphadenopathy:     Cervical: No cervical adenopathy.  Skin:    Findings: No erythema or rash.  Neurological:     Mental Status: She is alert.  Psychiatric:        Mood and Affect: Mood normal.        Behavior: Behavior normal.         Outpatient Encounter Medications as of 04/28/2023  Medication Sig   Accu-Chek Softclix Lancets lancets USE TO TEST BLOOD SUGAR FOUR TIMES DAILY   aspirin 81 MG tablet Take 81 mg by mouth daily.   atorvastatin (LIPITOR) 20 MG tablet TAKE 1 TABLET(20 MG) BY MOUTH DAILY   blood glucose meter kit and supplies Dispense based on patient and insurance preference. Use up to four times daily as directed. (FOR ICD-10 E10.9, E11.9).   carvedilol (COREG) 3.125 MG tablet TAKE 1 TABLET(3.125 MG) BY MOUTH TWICE DAILY   dapagliflozin propanediol (FARXIGA) 10 MG TABS tablet Take 1 tablet (10 mg total) by mouth in the morning.   glucose blood test strip Use as instructed to blood sugar twice daily   levothyroxine (SYNTHROID) 112 MCG tablet TAKE 1 TABLET(112 MCG) BY MOUTH DAILY BEFORE AND BREAKFAST    lisinopril (ZESTRIL) 5 MG tablet Take 1 tablet (5 mg total) by mouth daily.   pantoprazole (PROTONIX) 40 MG tablet Take 1 tablet (40 mg total) by mouth daily as needed.   Semaglutide,0.25 or 0.5MG /DOS, (OZEMPIC, 0.25 OR 0.5 MG/DOSE,) 2 MG/3ML SOPN Inject 0.25 mg + 10 clicks weekly   triamcinolone cream (KENALOG) 0.1 % Apply 1 Application topically 2 (two) times daily.   vitamin B-12 (CYANOCOBALAMIN) 1000 MCG tablet Take 1,000 mcg by mouth daily.   No facility-administered encounter medications on file as of 04/28/2023.     Lab Results  Component Value Date   WBC 6.5 04/26/2023   HGB 13.2 04/26/2023   HCT 41.2 04/26/2023   PLT 102.0 (L) 04/26/2023   GLUCOSE 142 (H) 04/26/2023   CHOL 123 04/26/2023   TRIG 121.0 04/26/2023   HDL 41.80 04/26/2023   LDLDIRECT 64.0 12/08/2021   LDLCALC 57 04/26/2023   ALT 15 04/26/2023   AST 14 04/26/2023   NA 140 04/26/2023   K 4.1 04/26/2023   CL 107 04/26/2023   CREATININE 1.04 04/26/2023   BUN 19 04/26/2023   CO2 27 04/26/2023   TSH 2.09 04/26/2023   INR 0.87 08/07/2017   HGBA1C 7.8 (H) 04/26/2023   MICROALBUR 0.9 01/23/2023    CT CHEST LCS NODULE F/U LOW DOSE WO CONTRAST Result Date: 01/16/2023 CLINICAL DATA:  Current 55 pack-year smoker. EXAM: CT CHEST WITHOUT CONTRAST FOR LUNG CANCER SCREENING NODULE FOLLOW-UP TECHNIQUE: Multidetector CT imaging of the chest was performed following the standard protocol without IV contrast. RADIATION DOSE REDUCTION: This exam was performed according to the departmental dose-optimization program which includes automated exposure control, adjustment of the mA and/or kV according to patient size and/or use of iterative reconstruction technique. COMPARISON:  09/21/2022  and 05/11/2021. FINDINGS: Cardiovascular: Atherosclerotic calcification of the aorta, aortic valve and coronary arteries. Heart is at the upper limits of normal in size. No pericardial effusion. Mediastinum/Nodes: No pathologically enlarged mediastinal  or axillary lymph nodes. Hilar regions are difficult to definitively evaluate without IV contrast. Esophagus is grossly unremarkable. Lungs/Pleura: Mild centrilobular emphysema. Smoking related respiratory bronchiolitis. 7.6 mm subpleural nodule in the posterior right lower lobe (4/180), stable. Additional pulmonary nodules measure 12.9 mm or less in size, as before. No pleural fluid. Mild cylindrical bronchiectasis. Debris in the airway. Upper Abdomen: Cholecystectomy. 2.8 cm right adrenal nodule measures 6 Hounsfield units. No follow-up necessary. Thickening of the left adrenal gland. No follow-up necessary. Low-attenuation lesions in the kidneys. No specific follow-up necessary. Small hiatal hernia. Visualized portions of the liver, adrenal glands, kidneys, spleen, pancreas, stomach and bowel are otherwise grossly unremarkable. No upper abdominal adenopathy. Musculoskeletal: Degenerative changes in the spine. Flowing anterior osteophytosis in the thoracic spine. IMPRESSION: 1. 7.6 mm subpleural nodule posterior right lower lobe is stable. Lung-RADS 3, probably benign findings. Short-term follow-up in 6 months is recommended with repeat low-dose chest CT without contrast (please use the following order, "CT CHEST LCS NODULE FOLLOW-UP W/O CM"). These results will be called to the ordering clinician or representative by the Radiologist Assistant, and communication documented in the PACS or [employer]. 2. Mild cylindrical bronchiectasis. 3. Right adrenal adenoma. 4. Aortic atherosclerosis (ICD10-I70.0). Coronary artery calcification. 5.  Emphysema (ICD10-J43.9). Electronically Signed   By: Leanna Battles M.D.   On: 01/16/2023 08:39       Assessment & Plan:  Low vitamin B12 level -     Vitamin B12; Future  Type 2 diabetes mellitus with hyperglycemia, without long-term current use of insulin (HCC) Assessment & Plan: On ozempic and farxiga.  Discussed diet and exercise.  Tolerating.  Follow sugars.   Follow met b and A1c.  A1c increased some from last check - to 7.8. Discussed medication. She recently increased her ozempic to .5mg . Will make no change today.   Check and record sugars.   Lab Results  Component Value Date   HGBA1C 7.8 (H) 04/26/2023     Orders: -     Basic metabolic panel with GFR; Future -     Hemoglobin A1c; Future  Hypercholesterolemia Assessment & Plan: Continue lipitor.  Follow lipid panel and liver function tests.    Orders: -     Hepatic function panel; Future -     Lipid panel; Future  Aortic atherosclerosis (HCC) Assessment & Plan: Continue lipitor.    Carotid artery disease, unspecified laterality, unspecified type [facility]) Assessment & Plan: Carotid ultrasound 06/02/21:  Right Carotid: Velocities in the right ICA are consistent with a 1-39% stenosis.                Non-hemodynamically significant plaque <50% noted in the CCA. The                ECA appears <50% stenosed.   Left Carotid: There was no evidence of thrombus, dissection, atherosclerotic               plaque or stenosis in the cervical carotid system.  Continue lipitor.    Gastroesophageal reflux disease with esophagitis without hemorrhage Assessment & Plan: No upper symptoms reported. Continue protonix.    Primary hypertension Assessment & Plan: Continue lisinopril. Blood pressure as outlined. No changes today. Follow pressures. Follow metabolic panel.    Hypothyroidism, unspecified type Assessment & Plan: Continue thyroid replacement.  Follow tsh.    Lung nodule Assessment & Plan: Had screening CT 12/22/22 - 7.6 mm subpleural nodule posterior right lower lobe is stable. Lung-RADS 3, probably benign findings. Short-term follow-up in 6 months is recommended with repeat low-dose chest CT without contrast. Reports she has discussed with pulmonary and they are scheduling.    Thrombocytopenia [facility]) Assessment & Plan: Saw hematology 10/17/22 - f/u thrombocytopenia. Stable.   Released.  Recommended check cbc 1-2x/year.   Stress Assessment & Plan: States she is handling things well. Follow.       Dale [place], MD

## 2023-05-04 ENCOUNTER — Encounter: Payer: Self-pay | Admitting: Internal Medicine

## 2023-05-04 NOTE — Assessment & Plan Note (Signed)
 No upper symptoms reported.  Continue protonix.

## 2023-05-04 NOTE — Assessment & Plan Note (Signed)
Continue lipitor.  Follow lipid panel and liver function tests.   

## 2023-05-04 NOTE — Assessment & Plan Note (Signed)
 Continue lipitor  ?

## 2023-05-04 NOTE — Assessment & Plan Note (Signed)
 On ozempic and farxiga.  Discussed diet and exercise.  Tolerating.  Follow sugars.  Follow met b and A1c.  A1c increased some from last check - to 7.8. Discussed medication. She recently increased her ozempic to .5mg . Will make no change today.   Check and record sugars.   Lab Results  Component Value Date   HGBA1C 7.8 (H) 04/26/2023

## 2023-05-04 NOTE — Assessment & Plan Note (Signed)
Continue thyroid replacement.  Follow tsh.   

## 2023-05-04 NOTE — Assessment & Plan Note (Signed)
 Continue lisinopril. Blood pressure as outlined. No changes today. Follow pressures. Follow metabolic panel.

## 2023-05-04 NOTE — Assessment & Plan Note (Signed)
 Had screening CT 12/22/22 - 7.6 mm subpleural nodule posterior right lower lobe is stable. Lung-RADS 3, probably benign findings. Short-term follow-up in 6 months is recommended with repeat low-dose chest CT without contrast. Reports she has discussed with pulmonary and they are scheduling.

## 2023-05-04 NOTE — Assessment & Plan Note (Signed)
States she is handling things well.  Follow.

## 2023-05-04 NOTE — Assessment & Plan Note (Signed)
 Saw hematology 10/17/22 - f/u thrombocytopenia. Stable.  Released.  Recommended check cbc 1-2x/year.

## 2023-05-04 NOTE — Assessment & Plan Note (Signed)
Carotid ultrasound 06/02/21:  Right Carotid: Velocities in the right ICA are consistent with a 1-39% stenosis.                Non-hemodynamically significant plaque <50% noted in the CCA. The                ECA appears <50% stenosed.   Left Carotid: There was no evidence of thrombus, dissection, atherosclerotic               plaque or stenosis in the cervical carotid system.  Continue lipitor.  

## 2023-05-16 ENCOUNTER — Telehealth: Payer: Self-pay | Admitting: Internal Medicine

## 2023-05-16 NOTE — Telephone Encounter (Addendum)
Sugar readings in your results folder for review

## 2023-05-16 NOTE — Telephone Encounter (Signed)
 Reviewed outside blood sugar readings. Sugars appear to be improving overall - most recent readings - 130-140s before breakfast and 140-180 before dinner, before bed and after lunch. Continue current medications. Continue to spot check sugars.

## 2023-05-16 NOTE — Telephone Encounter (Signed)
Patient dropped off sugar readings. Readings are up front in Dr Bary Leriche color folder. ?

## 2023-05-17 NOTE — Telephone Encounter (Signed)
 Patient aware of below.

## 2023-05-20 ENCOUNTER — Other Ambulatory Visit: Payer: Self-pay | Admitting: Internal Medicine

## 2023-05-20 DIAGNOSIS — E1165 Type 2 diabetes mellitus with hyperglycemia: Secondary | ICD-10-CM

## 2023-05-20 MED ORDER — DAPAGLIFLOZIN PROPANEDIOL 10 MG PO TABS: 10.0000 mg | ORAL_TABLET | Freq: Every morning | ORAL | 3 refills | Status: DC

## 2023-05-20 NOTE — Progress Notes (Signed)
 Rx for farxiga sent to [facility].

## 2023-05-23 ENCOUNTER — Telehealth: Payer: Self-pay

## 2023-05-23 NOTE — Telephone Encounter (Signed)
 Rx sent in for farxiga to [facility]. HAS BEEN SENT BY Dellar Fenton MD. FOR FURTHER REFILLS  PLEASE BE ADVISED.[name]

## 2023-05-25 ENCOUNTER — Other Ambulatory Visit: Payer: Self-pay | Admitting: Internal Medicine

## 2023-05-25 DIAGNOSIS — E1165 Type 2 diabetes mellitus with hyperglycemia: Secondary | ICD-10-CM

## 2023-05-25 NOTE — Telephone Encounter (Signed)
 Copied from CRM [id number]. Topic: Clinical - Medication Refill >> May 25, 2023  2:20 PM Elita Guitar wrote: Most Recent Primary Care Visit:  Provider: SCOTT, CHARLENE  Department: [facility]-  Visit Type: OFFICE VISIT  Date: 04/28/2023  Medication: Accu-check  Has the patient contacted their pharmacy? Yes Claimed they reached out.  Is this the correct pharmacy for this prescription? Yes  [id number] - [place], [place] - [address] AT [facility] OF [address] [place] [address] Phone: [phone] Fax: [phone]   Has the prescription been filled recently? Yes  Is the patient out of the medication? Yes  Has the patient been seen for an appointment in the last year OR does the patient have an upcoming appointment? Yes  Can we respond through MyChart? Yes  Agent: Please be advised that Rx refills may take up to 3 business days. We ask that you follow-up with your pharmacy.

## 2023-05-29 MED ORDER — GLUCOSE BLOOD VI STRP: ORAL_STRIP | 12 refills | Status: AC

## 2023-05-29 MED ORDER — ACCU-CHEK SOFTCLIX LANCETS MISC: 12 refills | Status: AC

## 2023-06-07 ENCOUNTER — Ambulatory Visit: Payer: Medicare Other | Admitting: *Deleted

## 2023-06-07 VITALS — BP 144/79 | Ht 62.0 in | Wt 133.0 lb

## 2023-06-07 DIAGNOSIS — Z0000 Encounter for general adult medical examination without abnormal findings: Secondary | ICD-10-CM | POA: Diagnosis not present

## 2023-06-07 NOTE — Patient Instructions (Signed)
 [name] , Thank you for taking time to come for your Medicare Wellness Visit. I appreciate your ongoing commitment to your health goals. Please review the following plan we discussed and let me know if I can assist you in the future.   Referrals/Orders/Follow-Ups/Clinician Recommendations: Remember to call and schedule your diabetic eye exam and update your covid vaccine.  This is a list of the screening recommended for you and due dates:  Health Maintenance  Topic Date Due   Eye exam for diabetics  06/22/2021   COVID-19 Vaccine (6 - 2024-25 season) 10/09/2022   Flu Shot  09/08/2023   DTaP/Tdap/Td vaccine (2 - Td or Tdap) 10/15/2023   Hemoglobin A1C  10/27/2023   Screening for Lung Cancer  12/22/2023   Yearly kidney health urinalysis for diabetes  01/23/2024   Complete foot exam   01/23/2024   Yearly kidney function blood test for diabetes  04/25/2024   Medicare Annual Wellness Visit  06/06/2024   Pneumonia Vaccine  Completed   DEXA scan (bone density measurement)  Completed   Hepatitis C Screening  Completed   Zoster (Shingles) Vaccine  Completed   HPV Vaccine  Aged Out   Meningitis B Vaccine  Aged Out   Colon Cancer Screening  Discontinued   Cologuard (Stool DNA test)  Discontinued    Advanced directives: (Declined) Advance directive discussed with you today. Even though you declined this today, please call our office should you change your mind, and we can give you the proper paperwork for you to fill out. Patient will work on this.  Next Medicare Annual Wellness Visit scheduled for next year: Yes 06/10/24 @ 8:50

## 2023-06-07 NOTE — Progress Notes (Signed)
 Subjective:   Ann Collins is a 78 y.o. who presents for a Medicare Wellness preventive visit.  Visit Complete: Virtual I connected with  Ann Collins on 06/07/23 by a audio enabled telemedicine application and verified that I am speaking with the correct person using two identifiers.  Patient Location: Home  Provider Location: Office/Clinic  I discussed the limitations of evaluation and management by telemedicine. The patient expressed understanding and agreed to proceed.  Vital Signs: Because this visit was a virtual/telehealth visit, some criteria may be missing or patient reported. Any vitals not documented were not able to be obtained and vitals that have been documented are patient reported.  VideoDeclined- This patient declined Librarian, academic. Therefore the visit was completed with audio only.  Persons Participating in Visit: Patient.  AWV Questionnaire: No: Patient Medicare AWV questionnaire was not completed prior to this visit.  Cardiac Risk Factors include: advanced age (>41men, >82 women);diabetes mellitus;dyslipidemia;hypertension     Objective:    Today's Vitals   06/07/23 0853  BP: (!) 144/79  Weight: 133 lb (60.3 kg)  Height: 5\' 2"  (1.575 m)   Body mass index is 24.33 kg/m.     06/07/2023    9:12 AM 09/28/2022   11:08 AM 06/06/2022    8:19 AM 06/03/2021    4:11 PM 06/02/2020   12:41 PM 05/31/2019   11:47 AM 05/30/2018   11:26 AM  Advanced Directives  Does Patient Have a Medical Advance Directive? No No No No No No Yes  Type of Tax inspector;Living will  Does patient want to make changes to medical advance directive?       No - Patient declined  Copy of Healthcare Power of Attorney in Chart?       No - copy requested  Would patient like information on creating a medical advance directive? No - Patient declined No - Patient declined No - Patient declined No - Patient declined No -  Patient declined Yes (MAU/Ambulatory/Procedural Areas - Information given)     Current Medications (verified) Outpatient Encounter Medications as of 06/07/2023  Medication Sig   Accu-Chek Softclix Lancets lancets Use as instructed   aspirin  81 MG tablet Take 81 mg by mouth daily.   atorvastatin  (LIPITOR) 20 MG tablet TAKE 1 TABLET(20 MG) BY MOUTH DAILY   blood glucose meter kit and supplies Dispense based on patient and insurance preference. Use up to four times daily as directed. (FOR ICD-10 E10.9, E11.9).   carvedilol  (COREG ) 3.125 MG tablet TAKE 1 TABLET(3.125 MG) BY MOUTH TWICE DAILY   dapagliflozin  propanediol (FARXIGA ) 10 MG TABS tablet Take 1 tablet (10 mg total) by mouth in the morning.   glucose blood test strip Use as instructed to blood sugar twice daily   levothyroxine  (SYNTHROID ) 112 MCG tablet TAKE 1 TABLET(112 MCG) BY MOUTH DAILY BEFORE AND BREAKFAST   lisinopril  (ZESTRIL ) 5 MG tablet Take 1 tablet (5 mg total) by mouth daily.   pantoprazole  (PROTONIX ) 40 MG tablet Take 1 tablet (40 mg total) by mouth daily as needed.   Semaglutide ,0.25 or 0.5MG /DOS, (OZEMPIC , 0.25 OR 0.5 MG/DOSE,) 2 MG/3ML SOPN Inject 0.25 mg + 10 clicks weekly   triamcinolone  cream (KENALOG ) 0.1 % Apply 1 Application topically 2 (two) times daily.   vitamin B-12 (CYANOCOBALAMIN ) 1000 MCG tablet Take 1,000 mcg by mouth daily.   No facility-administered encounter medications on file as of 06/07/2023.    Allergies (verified)  Pioglitazone, Macrobid [nitrofurantoin macrocrystal], and Nitrofurantoin   History: Past Medical History:  Diagnosis Date   Abdominal hernia    CAD (coronary artery disease)    a. LHC 7/19: LM nl, p-mLAD 100% w/ right to left collaterals, OM2 90% (2 mm), 1st LPL 40%, medical management   Diabetes mellitus (HCC)    Diverticulosis    GERD (gastroesophageal reflux disease)    Hematuria    Hypercholesterolemia    Hypertension    Hypothyroidism    Kidney stones    Past Surgical  History:  Procedure Laterality Date   APPENDECTOMY     BREAST BIOPSY  1969   right   CHOLECYSTECTOMY  2005   COLONOSCOPY     COLONOSCOPY WITH PROPOFOL  N/A 09/19/2017   Procedure: COLONOSCOPY WITH PROPOFOL ;  Surgeon: Toledo, Alphonsus Jeans, MD;  Location: [facility];  Service: Gastroenterology;  Laterality: N/A;   ESOPHAGOGASTRODUODENOSCOPY (EGD) WITH PROPOFOL  N/A 09/19/2017   Procedure: ESOPHAGOGASTRODUODENOSCOPY (EGD) WITH PROPOFOL ;  Surgeon: Toledo, Alphonsus Jeans, MD;  Location: [facility];  Service: Gastroenterology;  Laterality: N/A;   LEFT HEART CATH AND CORONARY ANGIOGRAPHY Left 08/07/2017   Procedure: LEFT HEART CATH AND CORONARY ANGIOGRAPHY;  Surgeon: Wenona Hamilton, MD;  Location: [facility];  Service: Cardiovascular;  Laterality: Left;   UPPER GI ENDOSCOPY     Family History  Problem Relation Age of Onset   Brain cancer Mother        history of brain tumor   Lung disease Father    Cancer Sister        Spindle cell carcinoma   Diabetes Brother        x2   Bipolar disorder Son    Alcohol abuse Son    Breast cancer Neg Hx    Colon cancer Neg Hx    Social History   Socioeconomic History   Marital status: Married    Spouse name: Not on file   Number of children: Not on file   Years of education: Not on file   Highest education level: 12th grade  Occupational History   Not on file  Tobacco Use   Smoking status: Every Day    Current packs/day: 1.00    Average packs/day: 1 pack/day for 50.0 years (50.0 ttl pk-yrs)    Types: Cigarettes   Smokeless tobacco: Never   Tobacco comments:    1 pack will last a day to a day and a half  Vaping Use   Vaping status: Never Used  Substance and Sexual Activity   Alcohol use: No    Alcohol/week: 0.0 standard drinks of alcohol   Drug use: No   Sexual activity: Not on file  Other Topics Concern   Not on file  Social History Narrative   Not on file   Social Drivers of Health   Financial Resource Strain: Low Risk   (06/07/2023)   Overall Financial Resource Strain (CARDIA)    Difficulty of Paying Living Expenses: Not hard at all  Food Insecurity: No Food Insecurity (06/07/2023)   Hunger Vital Sign    Worried About Running Out of Food in the Last Year: Never true    Ran Out of Food in the Last Year: Never true  Transportation Needs: No Transportation Needs (06/07/2023)   PRAPARE - Administrator, Civil Service (Medical): No    Lack of Transportation (Non-Medical): No  Physical Activity: Insufficiently Active (06/07/2023)   Exercise Vital Sign    Days of Exercise per Week: 3  days    Minutes of Exercise per Session: 30 min  Stress: No Stress Concern Present (06/07/2023)   [employer] of Occupational Health - Occupational Stress Questionnaire    Feeling of Stress : Only a little  Social Connections: Socially Integrated (06/07/2023)   Social Connection and Isolation Panel [NHANES]    Frequency of Communication with Friends and Family: More than three times a week    Frequency of Social Gatherings with Friends and Family: More than three times a week    Attends Religious Services: More than 4 times per year    Active Member of [employer] or Organizations: Yes    Attends Engineer, structural: More than 4 times per year    Marital Status: Married    Tobacco Counseling Ready to quit: No Counseling given: Yes Tobacco comments: 1 pack will last a day to a day and a half    Clinical Intake:  Pre-visit preparation completed: Yes  Pain : No/denies pain     BMI - recorded: 24.33 Nutritional Status: BMI of 19-24  Normal Nutritional Risks: None Diabetes: Yes CBG done?: Yes (FBS 144 per patient) CBG resulted in Enter/ Edit results?: No  Lab Results  Component Value Date   HGBA1C 7.8 (H) 04/26/2023   HGBA1C 7.5 (H) 01/19/2023   HGBA1C 7.2 (H) 09/21/2022     How often do you need to have someone help you when you read instructions, pamphlets, or other written materials from  your doctor or pharmacy?: 1 - Never  Interpreter Needed?: No  Information entered by :: R. Patte Winkel LPN   Activities of Daily Living     06/07/2023    9:00 AM 06/07/2023    8:58 AM  In your present state of health, do you have any difficulty performing the following activities:  Hearing? 0 0  Vision? 0 0  Comment readers   Difficulty concentrating or making decisions? 0 0  Walking or climbing stairs? 0 0  Dressing or bathing? 0 0  Doing errands, shopping? 0 0  Preparing Food and eating ? N N  Using the Toilet? N N  In the past six months, have you accidently leaked urine? N N  Do you have problems with loss of bowel control? N N  Managing your Medications? N N  Managing your Finances? N N  Housekeeping or managing your Housekeeping? N N    Patient Care Team: Dellar Fenton, MD as PCP - General (Internal Medicine) Wenona Hamilton, MD as PCP - Cardiology (Cardiology) Daron Ellen, [facility] (Pharmacist)  Indicate any recent Medical Services you may have received from other than [facility] providers in the past year (date may be approximate).     Assessment:   This is a routine wellness examination for Ann Collins.  Hearing/Vision screen Hearing Screening - Comments:: No issues Vision Screening - Comments:: readers   Goals Addressed             This Visit's Progress    Patient Stated       Wants to keep active       Depression Screen     06/07/2023    9:08 AM 09/28/2022   11:43 AM 06/06/2022    8:18 AM 12/10/2021   10:08 AM 06/03/2021    4:08 PM 06/02/2020   12:52 PM 06/02/2020    8:02 AM  PHQ 2/9 Scores  PHQ - 2 Score 0 0 0 0 0 0 0  PHQ- 9 Score 1  Fall Risk     06/07/2023    9:02 AM 05/31/2022    9:21 PM 12/10/2021   10:08 AM 06/03/2021    4:13 PM 12/15/2020    7:41 AM  Fall Risk   Falls in the past year? 0 0 1 0 0  Number falls in past yr: 0 0 0 0 0  Injury with Fall? 0 0 0  0  Risk for fall due to : No Fall Risks  No Fall Risks  No Fall Risks  Follow up  Falls prevention discussed;Falls evaluation completed Falls evaluation completed;Falls prevention discussed Falls evaluation completed Falls evaluation completed Falls evaluation completed    MEDICARE RISK AT HOME:  Medicare Risk at Home Any stairs in or around the home?: Yes If so, are there any without handrails?: Yes Home free of loose throw rugs in walkways, pet beds, electrical cords, etc?: Yes Adequate lighting in your home to reduce risk of falls?: Yes Life alert?: No Use of a cane, walker or w/c?: No Grab bars in the bathroom?: No Shower chair or bench in shower?: No Elevated toilet seat or a handicapped toilet?: No  TIMED UP AND GO:  Was the test performed?  No  Cognitive Function: 6CIT completed        06/07/2023    9:12 AM 06/06/2022    8:21 AM 05/31/2019   11:54 AM 05/30/2018   11:28 AM 05/12/2017    8:41 AM  6CIT Screen  What Year? 0 points 0 points 0 points 0 points 0 points  What month? 0 points 0 points 0 points 0 points 0 points  What time? 0 points 0 points 0 points 0 points 0 points  Count back from 20 0 points 0 points  0 points 0 points  Months in reverse 0 points 0 points 0 points 0 points 0 points  Repeat phrase 0 points 0 points   0 points  Total Score 0 points 0 points   0 points    Immunizations Immunization History  Administered Date(s) Administered   Fluad Quad(high Dose 65+) 11/11/2019   Influenza Split 10/16/2013   Influenza Whole 11/24/2016   Influenza, High Dose Seasonal PF 11/05/2017, 10/18/2018, 11/21/2020, 10/25/2021   Influenza,inj,quad, With Preservative 11/07/2017   Influenza-Unspecified 11/07/2012, 10/27/2014   Moderna SARS-COV2 Booster Vaccination 11/21/2020   Moderna Sars-Covid-2 Vaccination 03/09/2019, 04/07/2019, 01/30/2020, 07/29/2020   Pneumococcal Conjugate-13 03/02/2016   Pneumococcal Polysaccharide-23 12/08/2012, 03/02/2017   Tdap 10/14/2013   Zoster Recombinant(Shingrix) 10/25/2021, 01/04/2022    Screening  Tests Health Maintenance  Topic Date Due   OPHTHALMOLOGY EXAM  06/22/2021   COVID-19 Vaccine (6 - 2024-25 season) 10/09/2022   Medicare Annual Wellness (AWV)  06/06/2023   INFLUENZA VACCINE  09/08/2023   DTaP/Tdap/Td (2 - Td or Tdap) 10/15/2023   HEMOGLOBIN A1C  10/27/2023   Lung Cancer Screening  12/22/2023   Diabetic kidney evaluation - Urine ACR  01/23/2024   FOOT EXAM  01/23/2024   Diabetic kidney evaluation - eGFR measurement  04/25/2024   Pneumonia Vaccine 75+ Years old  Completed   DEXA SCAN  Completed   Hepatitis C Screening  Completed   Zoster Vaccines- Shingrix  Completed   HPV VACCINES  Aged Out   Meningococcal B Vaccine  Aged Out   Colonoscopy  Discontinued   Fecal DNA (Cologuard)  Discontinued    Health Maintenance  Health Maintenance Due  Topic Date Due   OPHTHALMOLOGY EXAM  06/22/2021   COVID-19 Vaccine (6 - 2024-25 season) 10/09/2022  Medicare Annual Wellness (AWV)  06/06/2023   Health Maintenance Items Addressed: Discussed the need to update covid vaccine.  Additional Screening:  Vision Screening: Recommended annual ophthalmology exams for early detection of glaucoma and other disorders of the eye. Patient is overdue. Patient stated that she will call and schedule a diabetic eye exam. [facility]  Dental Screening: Recommended annual dental exams for proper oral hygiene  Community Resource Referral / Chronic Care Management: CRR required this visit?  No   CCM required this visit?  No     Plan:     I have personally reviewed and noted the following in the patient's chart:   Medical and social history Use of alcohol, tobacco or illicit drugs  Current medications and supplements including opioid prescriptions. Patient is not currently taking opioid prescriptions. Functional ability and status Nutritional status Physical activity Advanced directives List of other physicians Hospitalizations, surgeries, and ER visits in previous 12  months Vitals Screenings to include cognitive, depression, and falls Referrals and appointments  In addition, I have reviewed and discussed with patient certain preventive protocols, quality metrics, and best practice recommendations. A written personalized care plan for preventive services as well as general preventive health recommendations were provided to patient.     Felicitas Horse, LPN   03/09/8655   After Visit Summary: (MyChart) Due to this being a telephonic visit, the after visit summary with patients personalized plan was offered to patient via MyChart   Notes: Please refer to Routing Comments.

## 2023-06-09 ENCOUNTER — Telehealth: Payer: Self-pay

## 2023-06-09 NOTE — Telephone Encounter (Signed)
 Please follow up with her regarding her blood pressure. Confirm no other acute symptoms. If persistent elevated blood pressure - needs a f/u appt with me to discuss.  Thanks   Dr Geralyn Knee    Called patient to scheduled acute visit with Dr Geralyn Knee to discuss. Patient declined. She says that her cuff had old batteries. She changed the batteries and now vitals have been wnl.

## 2023-07-13 ENCOUNTER — Telehealth: Payer: Self-pay

## 2023-07-13 NOTE — Telephone Encounter (Signed)
 4 boxes of ozempic  receipt from pt assistance program. LM for patient to let her know and placed medication in refrigerator for pick up

## 2023-07-14 ENCOUNTER — Telehealth: Payer: Self-pay

## 2023-07-14 NOTE — Telephone Encounter (Signed)
 Noted

## 2023-07-14 NOTE — Telephone Encounter (Signed)
 Copied from CRM [id number]. Topic: General - Other >> Jul 13, 2023  5:47 PM Luane Rumps D wrote: Reason for CRM: Patient will stop by tomorrow to pick up ozempic .

## 2023-08-03 ENCOUNTER — Ambulatory Visit: Payer: Self-pay | Admitting: Internal Medicine

## 2023-08-03 ENCOUNTER — Other Ambulatory Visit

## 2023-08-03 DIAGNOSIS — E1165 Type 2 diabetes mellitus with hyperglycemia: Secondary | ICD-10-CM | POA: Diagnosis not present

## 2023-08-03 DIAGNOSIS — E7800 Pure hypercholesterolemia, unspecified: Secondary | ICD-10-CM | POA: Diagnosis not present

## 2023-08-03 DIAGNOSIS — R7989 Other specified abnormal findings of blood chemistry: Secondary | ICD-10-CM

## 2023-08-03 LAB — LIPID PANEL
Cholesterol: 127 mg/dL (ref 0–200)
HDL: 43.2 mg/dL (ref >39.00)
LDL Cholesterol: 61 mg/dL (ref 0–99)
NonHDL: 83.62
Total CHOL/HDL Ratio: 3
Triglycerides: 113 mg/dL (ref 0.0–149.0)
VLDL: 22.6 mg/dL (ref 0.0–40.0)

## 2023-08-03 LAB — HEPATIC FUNCTION PANEL
ALT: 14 U/L (ref 0–35)
AST: 15 U/L (ref 0–37)
Albumin: 3.9 g/dL (ref 3.5–5.2)
Alkaline Phosphatase: 72 U/L (ref 39–117)
Bilirubin, Direct: 0.1 mg/dL (ref 0.0–0.3)
Total Bilirubin: 0.5 mg/dL (ref 0.2–1.2)
Total Protein: 6.6 g/dL (ref 6.0–8.3)

## 2023-08-03 LAB — BASIC METABOLIC PANEL WITH GFR
BUN: 18 mg/dL (ref 6–23)
CO2: 26 meq/L (ref 19–32)
Calcium: 9.2 mg/dL (ref 8.4–10.5)
Chloride: 107 meq/L (ref 96–112)
Creatinine, Ser: 1.04 mg/dL (ref 0.40–1.20)
GFR: 51.77 mL/min — ABNORMAL LOW (ref >60.00)
Glucose, Bld: 138 mg/dL — ABNORMAL HIGH (ref 70–99)
Potassium: 4.4 meq/L (ref 3.5–5.1)
Sodium: 139 meq/L (ref 135–145)

## 2023-08-03 LAB — HEMOGLOBIN A1C: Hgb A1c MFr Bld: 7.6 % — ABNORMAL HIGH (ref 4.6–6.5)

## 2023-08-03 LAB — VITAMIN B12: Vitamin B-12: 458 pg/mL (ref 211–911)

## 2023-08-07 ENCOUNTER — Encounter: Payer: Self-pay | Admitting: Internal Medicine

## 2023-08-07 ENCOUNTER — Ambulatory Visit (INDEPENDENT_AMBULATORY_CARE_PROVIDER_SITE_OTHER): Admitting: Internal Medicine

## 2023-08-07 VITALS — BP 110/64 | HR 74 | Resp 16 | Ht 62.0 in | Wt 131.0 lb

## 2023-08-07 DIAGNOSIS — R911 Solitary pulmonary nodule: Secondary | ICD-10-CM | POA: Diagnosis not present

## 2023-08-07 DIAGNOSIS — F439 Reaction to severe stress, unspecified: Secondary | ICD-10-CM

## 2023-08-07 DIAGNOSIS — E7800 Pure hypercholesterolemia, unspecified: Secondary | ICD-10-CM

## 2023-08-07 DIAGNOSIS — E1165 Type 2 diabetes mellitus with hyperglycemia: Secondary | ICD-10-CM

## 2023-08-07 DIAGNOSIS — K2100 Gastro-esophageal reflux disease with esophagitis, without bleeding: Secondary | ICD-10-CM

## 2023-08-07 DIAGNOSIS — D692 Other nonthrombocytopenic purpura: Secondary | ICD-10-CM | POA: Insufficient documentation

## 2023-08-07 DIAGNOSIS — Z87891 Personal history of nicotine dependence: Secondary | ICD-10-CM | POA: Diagnosis not present

## 2023-08-07 DIAGNOSIS — I10 Essential (primary) hypertension: Secondary | ICD-10-CM

## 2023-08-07 DIAGNOSIS — I779 Disorder of arteries and arterioles, unspecified: Secondary | ICD-10-CM

## 2023-08-07 DIAGNOSIS — E039 Hypothyroidism, unspecified: Secondary | ICD-10-CM

## 2023-08-07 DIAGNOSIS — I700 Atherosclerosis of aorta: Secondary | ICD-10-CM | POA: Diagnosis not present

## 2023-08-07 DIAGNOSIS — D696 Thrombocytopenia, unspecified: Secondary | ICD-10-CM

## 2023-08-07 MED ORDER — LEVOTHYROXINE SODIUM 112 MCG PO TABS: ORAL_TABLET | ORAL | 1 refills | Status: DC

## 2023-08-07 MED ORDER — CARVEDILOL 3.125 MG PO TABS: ORAL_TABLET | ORAL | 1 refills | Status: DC

## 2023-08-07 MED ORDER — ATORVASTATIN CALCIUM 20 MG PO TABS: ORAL_TABLET | ORAL | 3 refills | Status: DC

## 2023-08-07 MED ORDER — LISINOPRIL 5 MG PO TABS: 5.0000 mg | ORAL_TABLET | Freq: Every day | ORAL | 1 refills | Status: DC

## 2023-08-07 MED ORDER — PANTOPRAZOLE SODIUM 40 MG PO TBEC: 40.0000 mg | DELAYED_RELEASE_TABLET | Freq: Every day | ORAL | 1 refills | Status: DC | PRN

## 2023-08-07 NOTE — Assessment & Plan Note (Signed)
Carotid ultrasound 06/02/21:  Right Carotid: Velocities in the right ICA are consistent with a 1-39% stenosis.                Non-hemodynamically significant plaque <50% noted in the CCA. The                ECA appears <50% stenosed.   Left Carotid: There was no evidence of thrombus, dissection, atherosclerotic               plaque or stenosis in the cervical carotid system.  Continue lipitor.  

## 2023-08-07 NOTE — Assessment & Plan Note (Signed)
 Desires not to quit at this time as discussed. Follow.

## 2023-08-07 NOTE — Assessment & Plan Note (Signed)
 States she is handling things well. Does not feel needs further intervention. Follow.

## 2023-08-07 NOTE — Progress Notes (Addendum)
 Subjective:    Patient ID: Ann Collins, female    DOB: May 29, 1945, 78 y.o.   MRN: [id number]  Patient here for  Chief Complaint  Patient presents with   Medical Management of Chronic Issues    HPI Here for a scheduled follow up -  follow up regarding her diabetes, hypertension and hypercholesterolemia. Saw hematology 10/17/22 - f/u thrombocytopenia. Stable.  Released.  Recommended check cbc 1-2x/year. Had screening CT 12/22/22 - 7.6 mm subpleural nodule posterior right lower lobe is stable. Lung-RADS 3, probably benign findings. Short-term follow-up in 6 months is recommended with repeat low-dose chest CT without contrast. Follow up chest CT - 12/22/22 - stable. Recommended 6 month f/u. D/w pulmonary regarding follow up.  Overall she feels things are stable. No chest pain. Breathing stable. Increased stress. Still smoking. Reports this is the way she copes currently and is unable to quit at this time. Does not feel needs any further intervention. Tolerating ozempic . Bowels moving q 2-3 days.  Is monitoring. Eating. No nausea.    Past Medical History:  Diagnosis Date   Abdominal hernia    CAD (coronary artery disease)    a. LHC 7/19: LM nl, p-mLAD 100% w/ right to left collaterals, OM2 90% (2 mm), 1st LPL 40%, medical management   Diabetes mellitus (HCC)    Diverticulosis    GERD (gastroesophageal reflux disease)    Hematuria    Hypercholesterolemia    Hypertension    Hypothyroidism    Kidney stones    Past Surgical History:  Procedure Laterality Date   APPENDECTOMY     BREAST BIOPSY  1969   right   CHOLECYSTECTOMY  2005   COLONOSCOPY     COLONOSCOPY WITH PROPOFOL  N/A 09/19/2017   Procedure: COLONOSCOPY WITH PROPOFOL ;  Surgeon: Toledo, Ladell POUR, MD;  Location: [facility] ENDOSCOPY;  Service: Gastroenterology;  Laterality: N/A;   ESOPHAGOGASTRODUODENOSCOPY (EGD) WITH PROPOFOL  N/A 09/19/2017   Procedure: ESOPHAGOGASTRODUODENOSCOPY (EGD) WITH PROPOFOL ;  Surgeon: Toledo, Ladell POUR, MD;   Location: [facility] ENDOSCOPY;  Service: Gastroenterology;  Laterality: N/A;   LEFT HEART CATH AND CORONARY ANGIOGRAPHY Left 08/07/2017   Procedure: LEFT HEART CATH AND CORONARY ANGIOGRAPHY;  Surgeon: Darron Deatrice LABOR, MD;  Location: [facility] INVASIVE CV LAB;  Service: Cardiovascular;  Laterality: Left;   UPPER GI ENDOSCOPY     Family History  Problem Relation Age of Onset   Brain cancer Mother        history of brain tumor   Lung disease Father    Cancer Sister        Spindle cell carcinoma   Diabetes Brother        x2   Bipolar disorder Son    Alcohol abuse Son    Breast cancer Neg Hx    Colon cancer Neg Hx    Social History   Socioeconomic History   Marital status: Married    Spouse name: Not on file   Number of children: Not on file   Years of education: Not on file   Highest education level: 12th grade  Occupational History   Not on file  Tobacco Use   Smoking status: Every Day    Current packs/day: 1.00    Average packs/day: 1 pack/day for 50.0 years (50.0 ttl pk-yrs)    Types: Cigarettes   Smokeless tobacco: Never   Tobacco comments:    1 pack will last a day to a day and a half  Vaping Use   Vaping status: Never Used  Substance and Sexual Activity   Alcohol use: No    Alcohol/week: 0.0 standard drinks of alcohol   Drug use: No   Sexual activity: Not on file  Other Topics Concern   Not on file  Social History Narrative   Not on file   Social Drivers of Health   Financial Resource Strain: Low Risk  (08/06/2023)   Overall Financial Resource Strain (CARDIA)    Difficulty of Paying Living Expenses: Not very hard  Food Insecurity: No Food Insecurity (08/06/2023)   Hunger Vital Sign    Worried About Running Out of Food in the Last Year: Never true    Ran Out of Food in the Last Year: Never true  Transportation Needs: No Transportation Needs (08/06/2023)   PRAPARE - Administrator, Civil Service (Medical): No    Lack of Transportation (Non-Medical): No   Physical Activity: Insufficiently Active (08/06/2023)   Exercise Vital Sign    Days of Exercise per Week: 3 days    Minutes of Exercise per Session: 40 min  Stress: No Stress Concern Present (08/06/2023)   [employer] of Occupational Health - Occupational Stress Questionnaire    Feeling of Stress: Not at all  Social Connections: Socially Integrated (08/06/2023)   Social Connection and Isolation Panel    Frequency of Communication with Friends and Family: More than three times a week    Frequency of Social Gatherings with Friends and Family: More than three times a week    Attends Religious Services: More than 4 times per year    Active Member of [employer] or Organizations: Yes    Attends Engineer, structural: More than 4 times per year    Marital Status: Married     Review of Systems  Constitutional:  Negative for appetite change and unexpected weight change.  HENT:  Negative for congestion and sinus pressure.   Respiratory:  Negative for cough, chest tightness and shortness of breath.   Cardiovascular:  Negative for chest pain, palpitations and leg swelling.  Gastrointestinal:  Negative for abdominal pain, diarrhea, nausea and vomiting.  Genitourinary:  Negative for difficulty urinating and dysuria.  Musculoskeletal:  Negative for joint swelling and myalgias.  Skin:  Negative for color change and rash.  Neurological:  Negative for dizziness and headaches.  Psychiatric/Behavioral:  Negative for agitation and dysphoric mood.        Objective:     BP 110/64   Pulse 74   Resp 16   Ht 5' 2 (1.575 m)   Wt 131 lb (59.4 kg)   LMP 03/10/1991   SpO2 98%   BMI 23.96 kg/m  Wt Readings from Last 3 Encounters:  08/07/23 131 lb (59.4 kg)  06/07/23 133 lb (60.3 kg)  04/28/23 131 lb 9.6 oz (59.7 kg)    Physical Exam Vitals reviewed.  Constitutional:      General: She is not in acute distress.    Appearance: Normal appearance.  HENT:     Head: Normocephalic and  atraumatic.     Right Ear: External ear normal.     Left Ear: External ear normal.     Mouth/Throat:     Pharynx: No oropharyngeal exudate or posterior oropharyngeal erythema.   Eyes:     General: No scleral icterus.       Right eye: No discharge.        Left eye: No discharge.     Conjunctiva/sclera: Conjunctivae normal.   Neck:     Thyroid : No  thyromegaly.   Cardiovascular:     Rate and Rhythm: Normal rate and regular rhythm.  Pulmonary:     Effort: No respiratory distress.     Breath sounds: Normal breath sounds. No wheezing.  Abdominal:     General: Bowel sounds are normal.     Palpations: Abdomen is soft.     Tenderness: There is no abdominal tenderness.   Musculoskeletal:        General: No swelling or tenderness.     Cervical back: Neck supple. No tenderness.  Lymphadenopathy:     Cervical: No cervical adenopathy.   Skin:    Findings: No erythema or rash.   Neurological:     Mental Status: She is alert.   Psychiatric:        Mood and Affect: Mood normal.        Behavior: Behavior normal.         Outpatient Encounter Medications as of 08/07/2023  Medication Sig   Accu-Chek Softclix Lancets lancets Use as instructed   aspirin  81 MG tablet Take 81 mg by mouth daily.   blood glucose meter kit and supplies Dispense based on patient and insurance preference. Use up to four times daily as directed. (FOR ICD-10 E10.9, E11.9).   dapagliflozin  propanediol (FARXIGA ) 10 MG TABS tablet Take 1 tablet (10 mg total) by mouth in the morning.   glucose blood test strip Use as instructed to blood sugar twice daily   Semaglutide ,0.25 or 0.5MG /DOS, (OZEMPIC , 0.25 OR 0.5 MG/DOSE,) 2 MG/3ML SOPN Inject 0.25 mg + 10 clicks weekly   triamcinolone  cream (KENALOG ) 0.1 % Apply 1 Application topically 2 (two) times daily.   vitamin B-12 (CYANOCOBALAMIN ) 1000 MCG tablet Take 1,000 mcg by mouth daily.   atorvastatin  (LIPITOR) 20 MG tablet TAKE 1 TABLET(20 MG) BY MOUTH DAILY    carvedilol  (COREG ) 3.125 MG tablet TAKE 1 TABLET(3.125 MG) BY MOUTH TWICE DAILY   levothyroxine  (SYNTHROID ) 112 MCG tablet TAKE 1 TABLET(112 MCG) BY MOUTH DAILY BEFORE AND BREAKFAST   lisinopril  (ZESTRIL ) 5 MG tablet Take 1 tablet (5 mg total) by mouth daily.   pantoprazole  (PROTONIX ) 40 MG tablet Take 1 tablet (40 mg total) by mouth daily as needed.   [DISCONTINUED] atorvastatin  (LIPITOR) 20 MG tablet TAKE 1 TABLET(20 MG) BY MOUTH DAILY   [DISCONTINUED] atorvastatin  (LIPITOR) 20 MG tablet TAKE 1 TABLET(20 MG) BY MOUTH DAILY   [DISCONTINUED] carvedilol  (COREG ) 3.125 MG tablet TAKE 1 TABLET(3.125 MG) BY MOUTH TWICE DAILY   [DISCONTINUED] carvedilol  (COREG ) 3.125 MG tablet TAKE 1 TABLET(3.125 MG) BY MOUTH TWICE DAILY   [DISCONTINUED] levothyroxine  (SYNTHROID ) 112 MCG tablet TAKE 1 TABLET(112 MCG) BY MOUTH DAILY BEFORE AND BREAKFAST   [DISCONTINUED] levothyroxine  (SYNTHROID ) 112 MCG tablet TAKE 1 TABLET(112 MCG) BY MOUTH DAILY BEFORE AND BREAKFAST   [DISCONTINUED] lisinopril  (ZESTRIL ) 5 MG tablet Take 1 tablet (5 mg total) by mouth daily.   [DISCONTINUED] lisinopril  (ZESTRIL ) 5 MG tablet Take 1 tablet (5 mg total) by mouth daily.   [DISCONTINUED] pantoprazole  (PROTONIX ) 40 MG tablet Take 1 tablet (40 mg total) by mouth daily as needed.   [DISCONTINUED] pantoprazole  (PROTONIX ) 40 MG tablet Take 1 tablet (40 mg total) by mouth daily as needed.   No facility-administered encounter medications on file as of 08/07/2023.     Lab Results  Component Value Date   WBC 6.5 04/26/2023   HGB 13.2 04/26/2023   HCT 41.2 04/26/2023   PLT 102.0 (L) 04/26/2023   GLUCOSE 138 (H) 08/03/2023   CHOL 127  08/03/2023   TRIG 113.0 08/03/2023   HDL 43.20 08/03/2023   LDLDIRECT 64.0 12/08/2021   LDLCALC 61 08/03/2023   ALT 14 08/03/2023   AST 15 08/03/2023   NA 139 08/03/2023   K 4.4 08/03/2023   CL 107 08/03/2023   CREATININE 1.04 08/03/2023   BUN 18 08/03/2023   CO2 26 08/03/2023   TSH 2.09 04/26/2023   INR  0.87 08/07/2017   HGBA1C 7.6 (H) 08/03/2023    CT CHEST LCS NODULE F/U LOW DOSE WO CONTRAST Result Date: 01/16/2023 CLINICAL DATA:  Current 55 pack-year smoker. EXAM: CT CHEST WITHOUT CONTRAST FOR LUNG CANCER SCREENING NODULE FOLLOW-UP TECHNIQUE: Multidetector CT imaging of the chest was performed following the standard protocol without IV contrast. RADIATION DOSE REDUCTION: This exam was performed according to the departmental dose-optimization program which includes automated exposure control, adjustment of the mA and/or kV according to patient size and/or use of iterative reconstruction technique. COMPARISON:  09/21/2022 and 05/11/2021. FINDINGS: Cardiovascular: Atherosclerotic calcification of the aorta, aortic valve and coronary arteries. Heart is at the upper limits of normal in size. No pericardial effusion. Mediastinum/Nodes: No pathologically enlarged mediastinal or axillary lymph nodes. Hilar regions are difficult to definitively evaluate without IV contrast. Esophagus is grossly unremarkable. Lungs/Pleura: Mild centrilobular emphysema. Smoking related respiratory bronchiolitis. 7.6 mm subpleural nodule in the posterior right lower lobe (4/180), stable. Additional pulmonary nodules measure 12.9 mm or less in size, as before. No pleural fluid. Mild cylindrical bronchiectasis. Debris in the airway. Upper Abdomen: Cholecystectomy. 2.8 cm right adrenal nodule measures 6 Hounsfield units. No follow-up necessary. Thickening of the left adrenal gland. No follow-up necessary. Low-attenuation lesions in the kidneys. No specific follow-up necessary. Small hiatal hernia. Visualized portions of the liver, adrenal glands, kidneys, spleen, pancreas, stomach and bowel are otherwise grossly unremarkable. No upper abdominal adenopathy. Musculoskeletal: Degenerative changes in the spine. Flowing anterior osteophytosis in the thoracic spine. IMPRESSION: 1. 7.6 mm subpleural nodule posterior right lower lobe is stable.  Lung-RADS 3, probably benign findings. Short-term follow-up in 6 months is recommended with repeat low-dose chest CT without contrast (please use the following order, CT CHEST LCS NODULE FOLLOW-UP W/O CM). These results will be called to the ordering clinician or representative by the Radiologist Assistant, and communication documented in the PACS or [employer]. 2. Mild cylindrical bronchiectasis. 3. Right adrenal adenoma. 4. Aortic atherosclerosis (ICD10-I70.0). Coronary artery calcification. 5.  Emphysema (ICD10-J43.9). Electronically Signed   By: Newell Eke M.D.   On: 01/16/2023 08:39       Assessment & Plan:  Type 2 diabetes mellitus with hyperglycemia, without long-term current use of insulin  (HCC) Assessment & Plan: On ozempic  and farxiga .  Discussed diet and exercise.  Tolerating.  Follow sugars.  Follow met b and A1c. A1c slightly improved.  Desires not to add medication at this time.   Lab Results  Component Value Date   HGBA1C 7.6 (H) 08/03/2023     Orders: -     Basic metabolic panel with GFR; Future -     Hemoglobin A1c; Future -     Microalbumin / creatinine urine ratio; Future  Hypercholesterolemia Assessment & Plan: Continue lipitor.  Follow lipid panel and liver function tests.  Discussed recent fasting lipid results.   Orders: -     Hepatic function panel; Future -     Lipid panel; Future  Hypothyroidism, unspecified type Assessment & Plan: On thyroid  replacement. Follow tsh.   Orders: -     Levothyroxine  Sodium; TAKE 1 TABLET(112  MCG) BY MOUTH DAILY BEFORE AND BREAKFAST  Dispense: 90 tablet; Refill: 1  Primary hypertension Assessment & Plan: Continue lisinopril . Blood pressure as outlined. No changes today. Follow pressures. Follow metabolic panel.   Orders: -     Lisinopril ; Take 1 tablet (5 mg total) by mouth daily.  Dispense: 90 tablet; Refill: 1  Aortic atherosclerosis (HCC) Assessment & Plan: Continue lipitor.    Carotid artery disease,  unspecified laterality, unspecified type [facility]) Assessment & Plan: Carotid ultrasound 06/02/21:  Right Carotid: Velocities in the right ICA are consistent with a 1-39% stenosis.                Non-hemodynamically significant plaque <50% noted in the CCA. The                ECA appears <50% stenosed.   Left Carotid: There was no evidence of thrombus, dissection, atherosclerotic               plaque or stenosis in the cervical carotid system.  Continue lipitor.    Gastroesophageal reflux disease with esophagitis without hemorrhage Assessment & Plan: Continues on protonix . No upper symptoms reporte.d    Lung nodule Assessment & Plan: Had screening CT 12/22/22 - 7.6 mm subpleural nodule posterior right lower lobe is stable. Lung-RADS 3, probably benign findings. Short-term follow-up in 6 months is recommended with repeat low-dose chest CT without contrast. Follow up with pulmonary regarding scheduling.    Personal history of tobacco use, presenting hazards to health Assessment & Plan: Desires not to quit at this time as discussed. Follow.    Stress Assessment & Plan: States she is handling things well. Does not feel needs further intervention. Follow.    Thrombocytopenia [facility]) Assessment & Plan: Saw hematology 10/17/22 - f/u thrombocytopenia. Stable.  Released.  Recommended check cbc 1-2x/year.  Orders: -     CBC with Differential/Platelet; Future  Senile purpura (HCC) Assessment & Plan: Easy bruising. No bleeding. Follow.    Other orders -     Atorvastatin  Calcium ; TAKE 1 TABLET(20 MG) BY MOUTH DAILY  Dispense: 90 tablet; Refill: 3 -     Carvedilol ; TAKE 1 TABLET(3.125 MG) BY MOUTH TWICE DAILY  Dispense: 180 tablet; Refill: 1 -     Pantoprazole  Sodium; Take 1 tablet (40 mg total) by mouth daily as needed.  Dispense: 90 tablet; Refill: 1     Allena Hamilton, MD

## 2023-08-07 NOTE — Assessment & Plan Note (Signed)
 On ozempic  and farxiga .  Discussed diet and exercise.  Tolerating.  Follow sugars.  Follow met b and A1c. A1c slightly improved.  Desires not to add medication at this time.   Lab Results  Component Value Date   HGBA1C 7.6 (H) 08/03/2023

## 2023-08-07 NOTE — Assessment & Plan Note (Signed)
 Saw hematology 10/17/22 - f/u thrombocytopenia. Stable.  Released.  Recommended check cbc 1-2x/year.

## 2023-08-07 NOTE — Assessment & Plan Note (Signed)
 Continue lipitor  ?

## 2023-08-07 NOTE — Assessment & Plan Note (Signed)
 Had screening CT 12/22/22 - 7.6 mm subpleural nodule posterior right lower lobe is stable. Lung-RADS 3, probably benign findings. Short-term follow-up in 6 months is recommended with repeat low-dose chest CT without contrast. Follow up with pulmonary regarding scheduling.

## 2023-08-07 NOTE — Assessment & Plan Note (Signed)
 On thyroid replacement.  Follow tsh.

## 2023-08-07 NOTE — Assessment & Plan Note (Signed)
 Continue lisinopril. Blood pressure as outlined. No changes today. Follow pressures. Follow metabolic panel.

## 2023-08-07 NOTE — Assessment & Plan Note (Signed)
 Continues on protonix. No upper symptoms reported.

## 2023-08-07 NOTE — Assessment & Plan Note (Signed)
 Easy bruising. No bleeding. Follow.

## 2023-08-07 NOTE — Assessment & Plan Note (Signed)
 Continue lipitor.  Follow lipid panel and liver function tests.  Discussed recent fasting lipid results.

## 2023-08-09 ENCOUNTER — Telehealth: Payer: Self-pay | Admitting: *Deleted

## 2023-08-09 DIAGNOSIS — F17210 Nicotine dependence, cigarettes, uncomplicated: Secondary | ICD-10-CM

## 2023-08-09 DIAGNOSIS — Z122 Encounter for screening for malignant neoplasm of respiratory organs: Secondary | ICD-10-CM

## 2023-08-09 DIAGNOSIS — Z87891 Personal history of nicotine dependence: Secondary | ICD-10-CM

## 2023-08-09 NOTE — Telephone Encounter (Signed)
 Spoke with pt. She was scheduled for a follow up LDCT on 7/3 but cancelled as she would be charged over $200. Spoke with [facility] - and was advised the pt would be responsible for $249 (20% co-insurance). Called patient back and left VM to see how she would like to move forward.

## 2023-08-09 NOTE — Telephone Encounter (Signed)
 Copied from CRM [id number]. Topic: Appointments - Scheduling Inquiry for Clinic >> Aug 09, 2023  3:15 PM Ann Collins wrote: Reason for CRM: Patient [phone] states had an appointment for imaging at [facility] 08/10/23 at 11 am and will be charged $259. Patient told them to cancel the appointment. Patient states was never charged or maybe some charged but not this much before and want to see what's going on. Patient has a CT scan in November 2025 and this CT scan is for a follow up. Patient has not seen a Adult nurse pulmonologist, she does the scheduling with Karna Curly. Provided Lung cancel screening phone: [phone] and warm transferred the call to [facility]. Per Karna, this message can be forward to her.

## 2023-08-10 ENCOUNTER — Ambulatory Visit

## 2023-08-10 NOTE — Telephone Encounter (Signed)
 Returned call to patient from VM.  Patient is declining the follow up CT for nodule.  She states she can not pay another fee for a follow up scan.  She is due in August for her annual and this will be her last LDCT due to age 78 years.  Will place order for annual LDCT.  Have scheduled the patient for annual in August 2025 due to declining the July follow up CT.

## 2023-08-15 DIAGNOSIS — Z860100 Personal history of colon polyps, unspecified: Secondary | ICD-10-CM | POA: Diagnosis not present

## 2023-08-15 DIAGNOSIS — K5900 Constipation, unspecified: Secondary | ICD-10-CM | POA: Diagnosis not present

## 2023-08-15 DIAGNOSIS — K219 Gastro-esophageal reflux disease without esophagitis: Secondary | ICD-10-CM | POA: Diagnosis not present

## 2023-08-25 ENCOUNTER — Other Ambulatory Visit: Payer: Self-pay | Admitting: Internal Medicine

## 2023-09-22 ENCOUNTER — Ambulatory Visit
Admission: RE | Admit: 2023-09-22 | Discharge: 2023-09-22 | Disposition: A | Discharge status: Home / Self Care | Source: Ambulatory Visit | Attending: Acute Care | Admitting: Acute Care

## 2023-09-22 DIAGNOSIS — Z87891 Personal history of nicotine dependence: Secondary | ICD-10-CM | POA: Diagnosis not present

## 2023-09-22 DIAGNOSIS — F17210 Nicotine dependence, cigarettes, uncomplicated: Secondary | ICD-10-CM | POA: Insufficient documentation

## 2023-09-22 DIAGNOSIS — Z122 Encounter for screening for malignant neoplasm of respiratory organs: Secondary | ICD-10-CM | POA: Insufficient documentation

## 2023-09-27 ENCOUNTER — Telehealth: Payer: Self-pay

## 2023-09-27 NOTE — Telephone Encounter (Signed)
 Filled out refill order form for Ozempic  from [facility] and faxed provider's office for review and signature.

## 2023-10-05 ENCOUNTER — Telehealth: Payer: Self-pay

## 2023-10-05 NOTE — Telephone Encounter (Signed)
 Pt notified of ozempic  here ready to pick.  4 boxes.  Lot; [id number]  Exp: 03/09/26

## 2023-10-08 ENCOUNTER — Other Ambulatory Visit: Payer: Self-pay | Admitting: Internal Medicine

## 2023-10-10 ENCOUNTER — Other Ambulatory Visit

## 2023-10-10 ENCOUNTER — Telehealth: Payer: Self-pay | Admitting: *Deleted

## 2023-10-10 DIAGNOSIS — D696 Thrombocytopenia, unspecified: Secondary | ICD-10-CM

## 2023-10-10 DIAGNOSIS — E1165 Type 2 diabetes mellitus with hyperglycemia: Secondary | ICD-10-CM

## 2023-10-10 DIAGNOSIS — E7800 Pure hypercholesterolemia, unspecified: Secondary | ICD-10-CM | POA: Diagnosis not present

## 2023-10-10 LAB — BASIC METABOLIC PANEL WITH GFR
BUN: 18 mg/dL (ref 6–23)
CO2: 29 meq/L (ref 19–32)
Calcium: 9.2 mg/dL (ref 8.4–10.5)
Chloride: 104 meq/L (ref 96–112)
Creatinine, Ser: 1 mg/dL (ref 0.40–1.20)
GFR: 54.19 mL/min — ABNORMAL LOW (ref >60.00)
Glucose, Bld: 147 mg/dL — ABNORMAL HIGH (ref 70–99)
Potassium: 4.5 meq/L (ref 3.5–5.1)
Sodium: 142 meq/L (ref 135–145)

## 2023-10-10 LAB — LIPID PANEL
Cholesterol: 170 mg/dL (ref 0–200)
HDL: 46.8 mg/dL (ref >39.00)
LDL Cholesterol: 93 mg/dL (ref 0–99)
NonHDL: 122.86
Total CHOL/HDL Ratio: 4
Triglycerides: 148 mg/dL (ref 0.0–149.0)
VLDL: 29.6 mg/dL (ref 0.0–40.0)

## 2023-10-10 LAB — CBC WITH DIFFERENTIAL/PLATELET
Basophils Absolute: 0.1 K/uL (ref 0.0–0.1)
Basophils Relative: 0.8 % (ref 0.0–3.0)
Eosinophils Absolute: 0.2 K/uL (ref 0.0–0.7)
Eosinophils Relative: 3.2 % (ref 0.0–5.0)
HCT: 40.4 % (ref 36.0–46.0)
Hemoglobin: 12.7 g/dL (ref 12.0–15.0)
Lymphocytes Relative: 45 % (ref 12.0–46.0)
Lymphs Abs: 3 K/uL (ref 0.7–4.0)
MCHC: 31.6 g/dL (ref 30.0–36.0)
MCV: 86 fl (ref 78.0–100.0)
Monocytes Absolute: 0.2 K/uL (ref 0.1–1.0)
Monocytes Relative: 3.6 % (ref 3.0–12.0)
Neutro Abs: 3.2 K/uL (ref 1.4–7.7)
Neutrophils Relative %: 47.4 % (ref 43.0–77.0)
Platelets: 120 K/uL — ABNORMAL LOW (ref 150.0–400.0)
RBC: 4.69 Mil/uL (ref 3.87–5.11)
RDW: 17.4 % — ABNORMAL HIGH (ref 11.5–15.5)
WBC: 6.7 K/uL (ref 4.0–10.5)

## 2023-10-10 LAB — MICROALBUMIN / CREATININE URINE RATIO
Creatinine,U: 34.7 mg/dL
Microalb Creat Ratio: UNDETERMINED mg/g (ref 0.0–30.0)
Microalb, Ur: <0.7 mg/dL

## 2023-10-10 LAB — HEPATIC FUNCTION PANEL
ALT: 17 U/L (ref 0–35)
AST: 16 U/L (ref 0–37)
Albumin: 3.9 g/dL (ref 3.5–5.2)
Alkaline Phosphatase: 82 U/L (ref 39–117)
Bilirubin, Direct: 0.1 mg/dL (ref 0.0–0.3)
Total Bilirubin: 0.4 mg/dL (ref 0.2–1.2)
Total Protein: 6.5 g/dL (ref 6.0–8.3)

## 2023-10-10 LAB — HEMOGLOBIN A1C: Hgb A1c MFr Bld: 7.7 % — ABNORMAL HIGH (ref 4.6–6.5)

## 2023-10-10 NOTE — Telephone Encounter (Signed)
 Patient picked up the Patient Assistance medications.

## 2023-10-10 NOTE — Telephone Encounter (Signed)
 Call report from [facility] Radiology:  IMPRESSION: 1. Lung-RADS 4B, suspicious. Additional imaging evaluation or consultation with Pulmonology or Thoracic Surgery recommended. Irregular and poorly marginated solid 10.0 mm posterior right lower lobe pulmonary nodule, increased from 7.6 mm on 12/22/2022 CT, suspicious for primary bronchogenic carcinoma. Suggest PET-CT for further characterization. 2. No thoracic adenopathy. 3. Two-vessel coronary atherosclerosis. 4. Aortic Atherosclerosis (ICD10-I70.0) and Emphysema (ICD10-J43.9).

## 2023-10-11 ENCOUNTER — Ambulatory Visit: Payer: Self-pay | Admitting: Internal Medicine

## 2023-10-11 ENCOUNTER — Telehealth: Payer: Self-pay | Admitting: Acute Care

## 2023-10-11 ENCOUNTER — Other Ambulatory Visit: Payer: Self-pay | Admitting: Acute Care

## 2023-10-11 DIAGNOSIS — R911 Solitary pulmonary nodule: Secondary | ICD-10-CM

## 2023-10-11 NOTE — Telephone Encounter (Signed)
 See telephone note from 10/11/2023

## 2023-10-11 NOTE — Telephone Encounter (Signed)
Results/ plans faxed to PCP.  

## 2023-10-11 NOTE — Telephone Encounter (Signed)
 Noted. I will schedule once PET is scheduled.

## 2023-10-11 NOTE — Telephone Encounter (Signed)
 I have called the patient with the results of her low-dose screening CT.  Her scan was read as a lung RADS 4B.  There is a irregular and poorly marginated solid 10 mm posterior right lower lobe pulmonary nodule that has progressed from 7.6 mm on 12/22/2022.  Suspicious for primary bronchogenic carcinoma.  I explained the results of the scan, and also that next best step would be to do a PET scan to further evaluate the nodule.  She is in agreement with a PET scan.  I have placed the order, she understands she will get a call to get this scheduled.  I have spoken with the navigational pulmonologists at the [facility] office, patient will need to be scheduled with either Dr. Tamea, Dr. Alfred, or Dr. Malka  within 1 to 2 weeks of the PET scan completion to review the results and determine next best steps in plan of care.  Lung cancer navigators, please fax results to PCP let her know plan is for a PET scan and follow-up with a pulmonary interventionalist at [facility] pulmonary in [place].  Patient is in agreement with the plan. Ann Collins will you please keep an eye on when this PET scan gets scheduled so that you can schedule Ann Collins to be seen by one of the navigational pulmonologists in the [place] office for further evaluation and planning.  Thank you so much.

## 2023-10-12 ENCOUNTER — Ambulatory Visit: Admitting: Internal Medicine

## 2023-10-12 VITALS — BP 128/70 | HR 64 | Resp 16 | Ht 62.0 in | Wt 129.6 lb

## 2023-10-12 DIAGNOSIS — R911 Solitary pulmonary nodule: Secondary | ICD-10-CM | POA: Diagnosis not present

## 2023-10-12 DIAGNOSIS — I779 Disorder of arteries and arterioles, unspecified: Secondary | ICD-10-CM | POA: Diagnosis not present

## 2023-10-12 DIAGNOSIS — Z87891 Personal history of nicotine dependence: Secondary | ICD-10-CM

## 2023-10-12 DIAGNOSIS — I700 Atherosclerosis of aorta: Secondary | ICD-10-CM | POA: Diagnosis not present

## 2023-10-12 DIAGNOSIS — D696 Thrombocytopenia, unspecified: Secondary | ICD-10-CM

## 2023-10-12 DIAGNOSIS — E7800 Pure hypercholesterolemia, unspecified: Secondary | ICD-10-CM | POA: Diagnosis not present

## 2023-10-12 DIAGNOSIS — Z0000 Encounter for general adult medical examination without abnormal findings: Secondary | ICD-10-CM

## 2023-10-12 DIAGNOSIS — E039 Hypothyroidism, unspecified: Secondary | ICD-10-CM | POA: Diagnosis not present

## 2023-10-12 DIAGNOSIS — E1165 Type 2 diabetes mellitus with hyperglycemia: Secondary | ICD-10-CM | POA: Diagnosis not present

## 2023-10-12 DIAGNOSIS — N1831 Chronic kidney disease, stage 3a: Secondary | ICD-10-CM | POA: Diagnosis not present

## 2023-10-12 DIAGNOSIS — I10 Essential (primary) hypertension: Secondary | ICD-10-CM | POA: Diagnosis not present

## 2023-10-12 DIAGNOSIS — F439 Reaction to severe stress, unspecified: Secondary | ICD-10-CM

## 2023-10-12 DIAGNOSIS — Z1231 Encounter for screening mammogram for malignant neoplasm of breast: Secondary | ICD-10-CM

## 2023-10-12 LAB — HM DIABETES FOOT EXAM

## 2023-10-12 NOTE — Progress Notes (Unsigned)
 Subjective:    Patient ID: Ann Collins, female    DOB: [birth year], 78 y.o.   MRN: [id number]  Patient here for  Chief Complaint  Patient presents with   Annual Exam    HPI With past history of diabetes, hypercholesterolemia and hypertension, here today to follow up on these issues as well as for a complete physical exam. Tolerating ozempic . Did see GI 08/15/23 - f/u - constipation. Recommended miralax and stool softeners regularly. Due colonoscopy. Elected to hold. Saw hematology 10/17/22 - f/u thrombocytopenia. Stable. Released. Recommended check cbc 1-2x/year. Had screening CT 12/22/22 - 7.6 mm subpleural nodule posterior right lower lobe is stable. Lung-RADS 3, probably benign findings. Short-term follow-up in 6 months is recommended with repeat low-dose chest CT without contrast. Follow up chest CT - 12/22/22 - stable. Recommended 6 month f/u. Recent CT scan - lung RADS 4B. There is a irregular and poorly marginated solid 10 mm posterior right lower lobe pulmonary nodule that has progressed from 7.6 mm on 12/22/2022. Suspicious for primary bronchogenic carcinoma. Plan for PET scan. She feels overall - stable. Breathing stable. No increased cough or congestion. Eating. No abdominal pain reported. Discussed bowel issues and ozempic .    Past Medical History:  Diagnosis Date   Abdominal hernia    CAD (coronary artery disease)    a. LHC 7/19: LM nl, p-mLAD 100% w/ right to left collaterals, OM2 90% (2 mm), 1st LPL 40%, medical management   Diabetes mellitus (HCC)    Diverticulosis    GERD (gastroesophageal reflux disease)    Hematuria    Hypercholesterolemia    Hypertension    Hypothyroidism    Kidney stones    Past Surgical History:  Procedure Laterality Date   APPENDECTOMY     BREAST BIOPSY  1969   right   CHOLECYSTECTOMY  2005   COLONOSCOPY     COLONOSCOPY WITH PROPOFOL  N/A 09/19/2017   Procedure: COLONOSCOPY WITH PROPOFOL ;  Surgeon: Toledo, Ladell POUR, MD;  Location: [facility]  ENDOSCOPY;  Service: Gastroenterology;  Laterality: N/A;   ESOPHAGOGASTRODUODENOSCOPY (EGD) WITH PROPOFOL  N/A 09/19/2017   Procedure: ESOPHAGOGASTRODUODENOSCOPY (EGD) WITH PROPOFOL ;  Surgeon: Toledo, Ladell POUR, MD;  Location: [facility] ENDOSCOPY;  Service: Gastroenterology;  Laterality: N/A;   LEFT HEART CATH AND CORONARY ANGIOGRAPHY Left 08/07/2017   Procedure: LEFT HEART CATH AND CORONARY ANGIOGRAPHY;  Surgeon: Darron Deatrice LABOR, MD;  Location: [facility] INVASIVE CV LAB;  Service: Cardiovascular;  Laterality: Left;   UPPER GI ENDOSCOPY     Family History  Problem Relation Age of Onset   Brain cancer Mother        history of brain tumor   Lung disease Father    Cancer Sister        Spindle cell carcinoma   Diabetes Brother        x2   Bipolar disorder Son    Alcohol abuse Son    Breast cancer Neg Hx    Colon cancer Neg Hx    Social History   Socioeconomic History   Marital status: Married    Spouse name: Not on file   Number of children: Not on file   Years of education: Not on file   Highest education level: 12th grade  Occupational History   Not on file  Tobacco Use   Smoking status: Every Day    Current packs/day: 1.00    Average packs/day: 1 pack/day for 50.0 years (50.0 ttl pk-yrs)    Types: Cigarettes   Smokeless tobacco: Never  Tobacco comments:    1 pack will last a day to a day and a half  Vaping Use   Vaping status: Never Used  Substance and Sexual Activity   Alcohol use: No    Alcohol/week: 0.0 standard drinks of alcohol   Drug use: No   Sexual activity: Not on file  Other Topics Concern   Not on file  Social History Narrative   Not on file   Social Drivers of Health   Financial Resource Strain: Low Risk  (08/06/2023)   Overall Financial Resource Strain (CARDIA)    Difficulty of Paying Living Expenses: Not very hard  Food Insecurity: No Food Insecurity (08/06/2023)   Hunger Vital Sign    Worried About Running Out of Food in the Last Year: Never true    Ran Out  of Food in the Last Year: Never true  Transportation Needs: No Transportation Needs (08/06/2023)   PRAPARE - Administrator, Civil Service (Medical): No    Lack of Transportation (Non-Medical): No  Physical Activity: Insufficiently Active (08/06/2023)   Exercise Vital Sign    Days of Exercise per Week: 3 days    Minutes of Exercise per Session: 40 min  Stress: No Stress Concern Present (08/06/2023)   [employer] of Occupational Health - Occupational Stress Questionnaire    Feeling of Stress: Not at all  Social Connections: Socially Integrated (08/06/2023)   Social Connection and Isolation Panel    Frequency of Communication with Friends and Family: More than three times a week    Frequency of Social Gatherings with Friends and Family: More than three times a week    Attends Religious Services: More than 4 times per year    Active Member of [employer] or Organizations: Yes    Attends Engineer, structural: More than 4 times per year    Marital Status: Married     Review of Systems  Constitutional:  Negative for appetite change and unexpected weight change.  HENT:  Negative for congestion and sinus pressure.   Respiratory:  Negative for cough, chest tightness and shortness of breath.   Cardiovascular:  Negative for chest pain, palpitations and leg swelling.  Gastrointestinal:  Negative for vomiting.       No increased abdominal pain.   Genitourinary:  Negative for difficulty urinating and dysuria.  Musculoskeletal:  Negative for joint swelling and myalgias.  Skin:  Negative for color change and rash.  Neurological:  Negative for dizziness and headaches.  Psychiatric/Behavioral:  Negative for agitation and dysphoric mood.        Objective:     BP 128/70   Pulse 64   Resp 16   Ht 5' 2 (1.575 m)   Wt 129 lb 9.6 oz (58.8 kg)   LMP 03/10/1991   SpO2 98%   BMI 23.70 kg/m  Wt Readings from Last 3 Encounters:  10/12/23 129 lb 9.6 oz (58.8 kg)  08/07/23 131  lb (59.4 kg)  06/07/23 133 lb (60.3 kg)    Physical Exam Vitals reviewed.  Constitutional:      General: She is not in acute distress.    Appearance: Normal appearance.  HENT:     Head: Normocephalic and atraumatic.     Right Ear: External ear normal.     Left Ear: External ear normal.     Mouth/Throat:     Pharynx: No oropharyngeal exudate or posterior oropharyngeal erythema.  Eyes:     General: No scleral icterus.  Right eye: No discharge.        Left eye: No discharge.     Conjunctiva/sclera: Conjunctivae normal.  Neck:     Thyroid : No thyromegaly.  Cardiovascular:     Rate and Rhythm: Normal rate and regular rhythm.  Pulmonary:     Effort: No respiratory distress.     Breath sounds: Normal breath sounds. No wheezing.  Abdominal:     General: Bowel sounds are normal.     Palpations: Abdomen is soft.     Tenderness: There is no abdominal tenderness.  Musculoskeletal:        General: No swelling or tenderness.     Cervical back: Neck supple. No tenderness.  Lymphadenopathy:     Cervical: No cervical adenopathy.  Skin:    Findings: No erythema or rash.  Neurological:     Mental Status: She is alert.  Psychiatric:        Mood and Affect: Mood normal.        Behavior: Behavior normal.     {Perform Simple Foot Exam  Perform Detailed exam:1} {Insert foot Exam (Optional):30965}   Outpatient Encounter Medications as of 10/12/2023  Medication Sig   Accu-Chek Softclix Lancets lancets Use as instructed   aspirin  81 MG tablet Take 81 mg by mouth daily.   atorvastatin  (LIPITOR) 20 MG tablet TAKE 1 TABLET(20 MG) BY MOUTH DAILY   blood glucose meter kit and supplies Dispense based on patient and insurance preference. Use up to four times daily as directed. (FOR ICD-10 E10.9, E11.9).   carvedilol  (COREG ) 3.125 MG tablet TAKE 1 TABLET(3.125 MG) BY MOUTH TWICE DAILY   dapagliflozin  propanediol (FARXIGA ) 10 MG TABS tablet Take 1 tablet (10 mg total) by mouth in the morning.    glucose blood test strip Use as instructed to blood sugar twice daily   levothyroxine  (SYNTHROID ) 112 MCG tablet TAKE 1 TABLET(112 MCG) BY MOUTH DAILY BEFORE AND BREAKFAST   lisinopril  (ZESTRIL ) 5 MG tablet Take 1 tablet (5 mg total) by mouth daily.   pantoprazole  (PROTONIX ) 40 MG tablet Take 1 tablet (40 mg total) by mouth daily as needed.   Semaglutide ,0.25 or 0.5MG /DOS, (OZEMPIC , 0.25 OR 0.5 MG/DOSE,) 2 MG/3ML SOPN Inject 0.25 mg + 10 clicks weekly   triamcinolone  cream (KENALOG ) 0.1 % Apply 1 Application topically 2 (two) times daily.   vitamin B-12 (CYANOCOBALAMIN ) 1000 MCG tablet Take 1,000 mcg by mouth daily.   No facility-administered encounter medications on file as of 10/12/2023.     Lab Results  Component Value Date   WBC 6.7 10/10/2023   HGB 12.7 10/10/2023   HCT 40.4 10/10/2023   PLT 120.0 (L) 10/10/2023   GLUCOSE 147 (H) 10/10/2023   CHOL 170 10/10/2023   TRIG 148.0 10/10/2023   HDL 46.80 10/10/2023   LDLDIRECT 64.0 12/08/2021   LDLCALC 93 10/10/2023   ALT 17 10/10/2023   AST 16 10/10/2023   NA 142 10/10/2023   K 4.5 10/10/2023   CL 104 10/10/2023   CREATININE 1.00 10/10/2023   BUN 18 10/10/2023   CO2 29 10/10/2023   TSH 2.09 04/26/2023   INR 0.87 08/07/2017   HGBA1C 7.7 (H) 10/10/2023   MICROALBUR <0.7 10/10/2023    CT CHEST LUNG CA SCREEN LOW DOSE W/O CM Result Date: 10/06/2023 CLINICAL DATA:  78 year old female current smoker with 56 pack-year smoking history, presenting for short-term follow-up EXAM: CT CHEST WITHOUT CONTRAST LOW-DOSE FOR LUNG CANCER SCREENING TECHNIQUE: Multidetector CT imaging of the chest was performed following the standard  protocol without IV contrast. RADIATION DOSE REDUCTION: This exam was performed according to the departmental dose-optimization program which includes automated exposure control, adjustment of the mA and/or kV according to patient size and/or use of iterative reconstruction technique. COMPARISON:  12/22/2022 screening  chest CT FINDINGS: Cardiovascular: Normal heart size. No significant pericardial effusion/thickening. Left circumflex and left anterior descending coronary atherosclerosis. Atherosclerotic nonaneurysmal thoracic aorta. Normal caliber pulmonary arteries. Mediastinum/Nodes: No significant thyroid  nodules. Unremarkable esophagus. No pathologically enlarged axillary, mediastinal or hilar lymph nodes, noting limited sensitivity for the detection of hilar adenopathy on this noncontrast study. Lungs/Pleura: No pneumothorax. No pleural effusion. Mild centrilobular emphysema with diffuse bronchial wall thickening. No acute consolidative airspace disease or lung masses. Irregular and poorly marginated solid 10.0 mm posterior right lower lobe pulmonary nodule on image 182, increased from 7.6 mm on 12/22/2022 CT. Additional previously visualized pulmonary nodules are stable, largest a 13.9 mm ground-glass posterior right lower lobe nodule on image 140. no new significant pulmonary nodules. Upper abdomen: Cholecystectomy. Several simple bilateral upper renal cysts, largest 2.2 cm in the posterior upper right kidney, for which no follow-up imaging is recommended. Musculoskeletal: No aggressive appearing focal osseous lesions. Moderate thoracic spondylosis. IMPRESSION: 1. Lung-RADS 4B, suspicious. Additional imaging evaluation or consultation with Pulmonology or Thoracic Surgery recommended. Irregular and poorly marginated solid 10.0 mm posterior right lower lobe pulmonary nodule, increased from 7.6 mm on 12/22/2022 CT, suspicious for primary bronchogenic carcinoma. Suggest PET-CT for further characterization. 2. No thoracic adenopathy. 3. Two-vessel coronary atherosclerosis. 4. Aortic Atherosclerosis (ICD10-I70.0) and Emphysema (ICD10-J43.9). These results will be called to the ordering clinician or representative by the Radiologist Assistant, and communication documented in the PACS or [employer]. Electronically Signed    By: Selinda DELENA Blue M.D.   On: 10/06/2023 19:13       Assessment & Plan:  Encounter for screening mammogram for malignant neoplasm of breast -     3D Screening Mammogram, Left and Right; Future  Health care maintenance Assessment & Plan: Physical today 10/12/23.  Mammogram 07/06/21 - Birads I.  Overdue. Colonoscopy 09/2017 - recommended f/u in 5 years. Saw GI.  Discussed due colonoscopy. Elected to hold.       Allena Hamilton, MD

## 2023-10-12 NOTE — Telephone Encounter (Signed)
 Appt has been scheduled for 9/15 with Dr. Malka. The patient is aware.

## 2023-10-12 NOTE — Assessment & Plan Note (Signed)
 Physical today 10/12/23.  Mammogram 07/06/21 - Birads I.  Overdue. Colonoscopy 09/2017 - recommended f/u in 5 years. Saw GI.  Discussed due colonoscopy. Elected to hold.

## 2023-10-12 NOTE — Patient Instructions (Signed)
 Trial of miralax daily to keep bowels moving. Update me on how the bowels are doing

## 2023-10-20 ENCOUNTER — Ambulatory Visit
Admission: RE | Admit: 2023-10-20 | Discharge: 2023-10-20 | Disposition: A | Discharge status: Home / Self Care | Source: Ambulatory Visit | Attending: Acute Care | Admitting: Acute Care

## 2023-10-20 DIAGNOSIS — D491 Neoplasm of unspecified behavior of respiratory system: Secondary | ICD-10-CM | POA: Insufficient documentation

## 2023-10-20 DIAGNOSIS — E119 Type 2 diabetes mellitus without complications: Secondary | ICD-10-CM | POA: Insufficient documentation

## 2023-10-20 DIAGNOSIS — N281 Cyst of kidney, acquired: Secondary | ICD-10-CM | POA: Diagnosis not present

## 2023-10-20 DIAGNOSIS — I700 Atherosclerosis of aorta: Secondary | ICD-10-CM | POA: Insufficient documentation

## 2023-10-20 DIAGNOSIS — R911 Solitary pulmonary nodule: Secondary | ICD-10-CM | POA: Diagnosis not present

## 2023-10-20 LAB — GLUCOSE, CAPILLARY: Glucose-Capillary: 133 mg/dL — ABNORMAL HIGH (ref 70–99)

## 2023-10-20 MED ORDER — FLUDEOXYGLUCOSE F - 18 (FDG) INJECTION
7.0100 | Freq: Once | INTRAVENOUS | Status: AC | PRN
  Administered 2023-10-20: 7.01 via INTRAVENOUS

## 2023-10-22 ENCOUNTER — Encounter: Payer: Self-pay | Admitting: Internal Medicine

## 2023-10-22 DIAGNOSIS — N1831 Chronic kidney disease, stage 3a: Secondary | ICD-10-CM | POA: Insufficient documentation

## 2023-10-22 NOTE — Assessment & Plan Note (Signed)
 Recent CT scan - lung RADS 4B. There is a irregular and poorly marginated solid 10 mm posterior right lower lobe pulmonary nodule that has progressed from 7.6 mm on 12/22/2022. Suspicious for primary bronchogenic carcinoma. Plan for PET scan.

## 2023-10-22 NOTE — Assessment & Plan Note (Signed)
 Have discussed quitting. Desires not to quit at this time.

## 2023-10-22 NOTE — Assessment & Plan Note (Signed)
 Continue lipitor  ?

## 2023-10-22 NOTE — Assessment & Plan Note (Signed)
 Overall reports she is handling things relatively well. Does not feel needs any further intervention. Follow.

## 2023-10-22 NOTE — Assessment & Plan Note (Signed)
 On ozempic  and farxiga .  Discussed diet and exercise.  Tolerating.  Follow sugars.  Follow met b and A1c as outlined. Increased slightly. Discussed treatment options. Desires not to add medication at this time. Follow.  Lab Results  Component Value Date   HGBA1C 7.7 (H) 10/10/2023

## 2023-10-22 NOTE — Assessment & Plan Note (Signed)
 On thyroid replacement.  Follow tsh.

## 2023-10-22 NOTE — Assessment & Plan Note (Signed)
Carotid ultrasound 06/02/21:  Right Carotid: Velocities in the right ICA are consistent with a 1-39% stenosis.                Non-hemodynamically significant plaque <50% noted in the CCA. The                ECA appears <50% stenosed.   Left Carotid: There was no evidence of thrombus, dissection, atherosclerotic               plaque or stenosis in the cervical carotid system.  Continue lipitor.  

## 2023-10-22 NOTE — Assessment & Plan Note (Addendum)
 Saw hematology 10/17/22 - f/u thrombocytopenia. Stable.  Released.  Recommended check cbc 1-2x/year - just checked 10/10/23 - stable 120.

## 2023-10-22 NOTE — Assessment & Plan Note (Signed)
 Avoid antiinflammatory medication. Stay hydrated. Continue lisinopril .

## 2023-10-22 NOTE — Assessment & Plan Note (Signed)
 Continue lisinopril. Blood pressure as outlined. No changes today. Follow pressures. Follow metabolic panel.

## 2023-10-22 NOTE — Assessment & Plan Note (Signed)
 Continue lipitor.  Follow lipid panel and liver function tests.  Discussed recent fasting lipid results. LDL 93. Taking regularly. Follow. If persistent increase, consider medication adjustment or adding zetia.

## 2023-10-23 ENCOUNTER — Encounter: Payer: Self-pay | Admitting: Pulmonary Disease

## 2023-10-23 ENCOUNTER — Ambulatory Visit: Admitting: Pulmonary Disease

## 2023-10-23 VITALS — BP 138/78 | HR 61 | Temp 97.3°F | Ht 62.0 in | Wt 131.0 lb

## 2023-10-23 DIAGNOSIS — R918 Other nonspecific abnormal finding of lung field: Secondary | ICD-10-CM

## 2023-10-23 DIAGNOSIS — F17210 Nicotine dependence, cigarettes, uncomplicated: Secondary | ICD-10-CM | POA: Diagnosis not present

## 2023-10-23 DIAGNOSIS — R911 Solitary pulmonary nodule: Secondary | ICD-10-CM

## 2023-10-23 NOTE — Progress Notes (Signed)
 Synopsis: Referred in by Glendia Shad, MD   Subjective:   PATIENT ID: Ann Collins GENDER: female DOB: 1945-12-27, MRN: [id number]  Chief Complaint  Patient presents with   Lung Mass    Nodule. Cough.    HPI [name] is a pleasant 78 year old female patient with a past medical history of tobacco use, hypothyroidism, hypertension, GERD and hyperlipidemia presenting today to the pulmonary clinic for further evaluation of pulmonary nodule.  She is enrolled in the lung cancer screening program and underwent an LDCT in 2024 that showed a new right lower lobe subpleural nodule measuring 7.5 mm at the time this was followed in 3 months interval and demonstrated stability however after roughly 9 months repeat low-dose CT was obtained and showed increase in size of that right lower lobe nodule now measuring 10 mm.  This prompted a PET scan that has not been officially read but to my eye there is mild FDG avidity with an SUV of 1.5.  She denies any respiratory symptoms.  She denies any loss of appetite but she is on Ozempic .  She denies any hemoptysis.  She is up-to-date on her breast cancer screening colon cancer screening.  She does not have any past medical history of malignancy.  Family history -father with COPD who was a heavy smoker.  Social history -unfortunately she continues to smoke and smokes 1 pack/day for 60 years.    Family History  Problem Relation Age of Onset   Brain cancer Mother        history of brain tumor   Lung disease Father    Cancer Sister        Spindle cell carcinoma   Diabetes Brother        x2   Bipolar disorder Son    Alcohol abuse Son    Breast cancer Neg Hx    Colon cancer Neg Hx      Social History   Socioeconomic History   Marital status: Married    Spouse name: Not on file   Number of children: Not on file   Years of education: Not on file   Highest education level: 12th grade  Occupational History   Not on file  Tobacco  Use   Smoking status: Every Day    Current packs/day: 1.00    Average packs/day: 1 pack/day for 50.0 years (50.0 ttl pk-yrs)    Types: Cigarettes   Smokeless tobacco: Never   Tobacco comments:    Smokes 1PPD - khj 10/23/2023        Started smoking as a teenager.     Has smoked 1 PPD at her heaviest  Vaping Use   Vaping status: Never Used  Substance and Sexual Activity   Alcohol use: No    Alcohol/week: 0.0 standard drinks of alcohol   Drug use: No   Sexual activity: Not on file  Other Topics Concern   Not on file  Social History Narrative   Not on file   Social Drivers of Health   Financial Resource Strain: Low Risk  (08/06/2023)   Overall Financial Resource Strain (CARDIA)    Difficulty of Paying Living Expenses: Not very hard  Food Insecurity: No Food Insecurity (08/06/2023)   Hunger Vital Sign    Worried About Running Out of Food in the Last Year: Never true    Ran Out of Food in the Last Year: Never true  Transportation Needs: No Transportation Needs (08/06/2023)   PRAPARE - Transportation  Lack of Transportation (Medical): No    Lack of Transportation (Non-Medical): No  Physical Activity: Insufficiently Active (08/06/2023)   Exercise Vital Sign    Days of Exercise per Week: 3 days    Minutes of Exercise per Session: 40 min  Stress: No Stress Concern Present (08/06/2023)   [employer] of Occupational Health - Occupational Stress Questionnaire    Feeling of Stress: Not at all  Social Connections: Socially Integrated (08/06/2023)   Social Connection and Isolation Panel    Frequency of Communication with Friends and Family: More than three times a week    Frequency of Social Gatherings with Friends and Family: More than three times a week    Attends Religious Services: More than 4 times per year    Active Member of [employer] or Organizations: Yes    Attends Engineer, structural: More than 4 times per year    Marital Status: Married  Catering manager Violence:  Not At Risk (06/07/2023)   Humiliation, Afraid, Rape, and Kick questionnaire    Fear of Current or Ex-Partner: No    Emotionally Abused: No    Physically Abused: No    Sexually Abused: No        Objective:   Vitals:   10/23/23 1025  BP: 138/78  Pulse: 61  Temp: (!) 97.3 F (36.3 C)  SpO2: 95%  Weight: 131 lb (59.4 kg)  Height: 5' 2 (1.575 m)   95% on RA BMI Readings from Last 3 Encounters:  10/23/23 23.96 kg/m  10/12/23 23.70 kg/m  08/07/23 23.96 kg/m   Wt Readings from Last 3 Encounters:  10/23/23 131 lb (59.4 kg)  10/12/23 129 lb 9.6 oz (58.8 kg)  08/07/23 131 lb (59.4 kg)    Physical Exam GEN: NAD, Healthy Appearing HEENT: Supple Neck, Reactive Pupils, EOMI  CVS: Normal S1, Normal S2, RRR, No murmurs or ES appreciated  Lungs: Clear bilateral air entry.  Abdomen: Soft, non tender, non distended, + BS  Extremities: Warm and well perfused, No edema   Labs and imaging were reviewed.   Ancillary Information   CBC    Component Value Date/Time   WBC 6.7 10/10/2023 0916   RBC 4.69 10/10/2023 0916   HGB 12.7 10/10/2023 0916   HGB 12.3 09/28/2022 1156   HCT 40.4 10/10/2023 0916   PLT 120.0 (L) 10/10/2023 0916   PLT 113 (L) 09/28/2022 1156   MCV 86.0 10/10/2023 0916   MCH 27.6 09/28/2022 1156   MCHC 31.6 10/10/2023 0916   RDW 17.4 (H) 10/10/2023 0916   LYMPHSABS 3.0 10/10/2023 0916   MONOABS 0.2 10/10/2023 0916   EOSABS 0.2 10/10/2023 0916   BASOSABS 0.1 10/10/2023 0916        No data to display           Assessment & Plan:  [name] is a pleasant 78 year old female patient with a past medical history of tobacco use, hypothyroidism, hypertension, GERD and hyperlipidemia presenting today to the pulmonary clinic for further evaluation of pulmonary nodule.  #RLL solid subpleural nodule  This is growing in size now measuring 10mm from 7.61mm. 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 PET scan with very  mild FDG avidity.  I discussed with Ms. Certain our options and have decided to seek thoracic surgeries opinion for a potential wedge resection. I discussed the case with Dr. Shyrl who has kindly offered to see her in clinic. Referral has been placed and PFTs scheduled for 09/25.   #  RLL GGO superior to the solid nodule  This has been stable for the last 2 years. Continue to monitor to prove stability for 5 years.   Return in about 3 months (around 01/22/2024).  I personally spent a total of 60 minutes in the care of the patient today including preparing to see the patient, getting/reviewing separately obtained history, performing a medically appropriate exam/evaluation, counseling and educating, placing orders, documenting clinical information in the EHR, and independently interpreting results.   Darrin Barn, MD [facility] 10/23/2023 10:55 AM

## 2023-11-02 ENCOUNTER — Ambulatory Visit: Admitting: Pulmonary Disease

## 2023-11-02 DIAGNOSIS — R911 Solitary pulmonary nodule: Secondary | ICD-10-CM | POA: Diagnosis not present

## 2023-11-02 LAB — PULMONARY FUNCTION TEST
DL/VA % pred: 86 %
DL/VA: 3.62 ml/min/mmHg/L
DLCO unc % pred: 87 %
DLCO unc: 15.3 ml/min/mmHg
FEF 25-75 Post: 1.49 L/s
FEF 25-75 Pre: 1.46 L/s
FEF2575-%Change-Post: 1 %
FEF2575-%Pred-Post: 102 %
FEF2575-%Pred-Pre: 101 %
FEV1-%Change-Post: -3 %
FEV1-%Pred-Post: 97 %
FEV1-%Pred-Pre: 101 %
FEV1-Post: 1.82 L
FEV1-Pre: 1.89 L
FEV1FVC-%Change-Post: -6 %
FEV1FVC-%Pred-Pre: 101 %
FEV6-%Change-Post: 2 %
FEV6-%Pred-Post: 108 %
FEV6-%Pred-Pre: 106 %
FEV6-Post: 2.57 L
FEV6-Pre: 2.51 L
FEV6FVC-%Change-Post: 0 %
FEV6FVC-%Pred-Post: 105 %
FEV6FVC-%Pred-Pre: 105 %
FVC-%Change-Post: 2 %
FVC-%Pred-Post: 103 %
FVC-%Pred-Pre: 100 %
FVC-Post: 2.58 L
FVC-Pre: 2.51 L
Post FEV1/FVC ratio: 70 %
Post FEV6/FVC ratio: 100 %
Pre FEV1/FVC ratio: 75 %
Pre FEV6/FVC Ratio: 100 %
RV % pred: 117 %
RV: 2.62 L
TLC % pred: 108 %
TLC: 5.17 L

## 2023-11-02 NOTE — Patient Instructions (Signed)
 Full PFT completed today ? ?

## 2023-11-02 NOTE — Progress Notes (Signed)
 Full PFT completed today ? ?

## 2023-11-03 ENCOUNTER — Encounter: Payer: Self-pay | Admitting: Thoracic Surgery (Cardiothoracic Vascular Surgery)

## 2023-11-03 ENCOUNTER — Ambulatory Visit
Attending: Thoracic Surgery (Cardiothoracic Vascular Surgery) | Admitting: Thoracic Surgery (Cardiothoracic Vascular Surgery)

## 2023-11-03 VITALS — BP 173/73 | HR 64 | Resp 20 | Ht 62.0 in | Wt 131.0 lb

## 2023-11-03 DIAGNOSIS — R911 Solitary pulmonary nodule: Secondary | ICD-10-CM | POA: Insufficient documentation

## 2023-11-03 NOTE — Progress Notes (Addendum)
 [address]       [place] [address]             [phone]                    NAYRA COURY [facility] Medical Record #[id number] Date of Birth: 06-Jun-1945  Referring: Malka Domino, MD Primary Care: Glendia Shad, MD Primary Cardiologist: Deatrice Cage, MD  Chief Complaint:    Chief Complaint  Patient presents with   Lung Lesion    New patient consultation, PET 9/12, Chest CT 8/15, PFTs 9/25    History of Present Illness:    Ann Collins is a 78 y.o. female who presents for surgical evaluation of a 1 cm right lower lobe pulmonary this originally was identified on lung cancer screening but has grown from 7.6 mm.  She continues to smoke with no plans of quitting.  She denies any shortness of breath or chest pain.  Of note she has had a PCI in the past.     Zubrod Score: At the time of surgery this patient's most appropriate activity status/level should be described as: [x]     0    Normal activity, no symptoms []     1    Restricted in physical strenuous activity but ambulatory, able to do out light work []     2    Ambulatory and capable of self care, unable to do work activities, up and about               >50 % of waking hours                              []     3    Only limited self care, in bed greater than 50% of waking hours []     4    Completely disabled, no self care, confined to bed or chair []     5    Moribund     Past Medical History:  Diagnosis Date   Abdominal hernia    CAD (coronary artery disease)    a. LHC 7/19: LM nl, p-mLAD 100% w/ right to left collaterals, OM2 90% (2 mm), 1st LPL 40%, medical management   Diabetes mellitus (HCC)    Diverticulosis    GERD (gastroesophageal reflux disease)    Hematuria    Hypercholesterolemia    Hypertension    Hypothyroidism    Kidney stones     Past Surgical History:  Procedure Laterality Date   APPENDECTOMY     BREAST BIOPSY  1969   right   CHOLECYSTECTOMY  2005    COLONOSCOPY     COLONOSCOPY WITH PROPOFOL  N/A 09/19/2017   Procedure: COLONOSCOPY WITH PROPOFOL ;  Surgeon: Toledo, Ladell POUR, MD;  Location: [facility];  Service: Gastroenterology;  Laterality: N/A;   ESOPHAGOGASTRODUODENOSCOPY (EGD) WITH PROPOFOL  N/A 09/19/2017   Procedure: ESOPHAGOGASTRODUODENOSCOPY (EGD) WITH PROPOFOL ;  Surgeon: Toledo, Ladell POUR, MD;  Location: [facility] ENDOS[facility];  Service: Gastroenterology;  Laterality: N/A;   LEFT HEART CATH AND CORONARY ANGIOGRAPHY Left 08/07/2017   Procedure: LEFT HEART CATH AND CORONARY ANGIOGRAPHY;  Surgeon: Cage Deatrice LABOR, MD;  Location: [facility];  Service: Cardiovascular;  Laterality: Left;   UPPER GI ENDOSCOPY      Family History  Problem Relation Age of Onset   Brain cancer Mother        history of brain  tumor   Lung disease Father    Cancer Sister        Spindle cell carcinoma   Diabetes Brother        x2   Bipolar disorder Son    Alcohol abuse Son    Breast cancer Neg Hx    Colon cancer Neg Hx      Social History   Tobacco Use  Smoking Status Every Day   Current packs/day: 1.00   Average packs/day: 1 pack/day for 50.0 years (50.0 ttl pk-yrs)   Types: Cigarettes  Smokeless Tobacco Never  Tobacco Comments   Smokes 1PPD - khj 10/23/2023      Started smoking as a teenager.    Has smoked 1 PPD at her heaviest    Social History   Substance and Sexual Activity  Alcohol Use No   Alcohol/week: 0.0 standard drinks of alcohol     Allergies  Allergen Reactions   Pioglitazone Swelling   Macrobid [Nitrofurantoin Macrocrystal] Other (See Comments)    Body aches   Nitrofurantoin Other (See Comments)    Body aches    Current Outpatient Medications  Medication Sig Dispense Refill   Accu-Chek Softclix Lancets lancets Use as instructed 100 each 12   aspirin  81 MG tablet Take 81 mg by mouth daily.     atorvastatin  (LIPITOR) 20 MG tablet TAKE 1 TABLET(20 MG) BY MOUTH DAILY 90 tablet 3   blood glucose meter kit and  supplies Dispense based on patient and insurance preference. Use up to four times daily as directed. (FOR ICD-10 E10.9, E11.9). 1 each 3   carvedilol  (COREG ) 3.125 MG tablet TAKE 1 TABLET(3.125 MG) BY MOUTH TWICE DAILY 180 tablet 1   dapagliflozin  propanediol (FARXIGA ) 10 MG TABS tablet Take 1 tablet (10 mg total) by mouth in the morning. 90 tablet 3   glucose blood test strip Use as instructed to blood sugar twice daily 100 each 12   levothyroxine  (SYNTHROID ) 112 MCG tablet TAKE 1 TABLET(112 MCG) BY MOUTH DAILY BEFORE AND BREAKFAST 90 tablet 1   lisinopril  (ZESTRIL ) 5 MG tablet Take 1 tablet (5 mg total) by mouth daily. 90 tablet 1   pantoprazole  (PROTONIX ) 40 MG tablet Take 1 tablet (40 mg total) by mouth daily as needed. 90 tablet 1   Semaglutide ,0.25 or 0.5MG /DOS, (OZEMPIC , 0.25 OR 0.5 MG/DOSE,) 2 MG/3ML SOPN Inject 0.25 mg + 10 clicks weekly 3 mL 11   triamcinolone  cream (KENALOG ) 0.1 % Apply 1 Application topically 2 (two) times daily. 30 g 0   vitamin B-12 (CYANOCOBALAMIN ) 1000 MCG tablet Take 1,000 mcg by mouth daily.     No current facility-administered medications for this visit.    Review of Systems  Constitutional:  Negative for malaise/fatigue.  Respiratory:  Positive for cough. Negative for shortness of breath.   Cardiovascular:  Negative for chest pain.     PHYSICAL EXAMINATION: BP (!) 173/73 (BP Location: Right Arm, Patient Position: Sitting, Cuff Size: Normal)   Pulse 64   Resp 20   Ht 5' 2 (1.575 m)   Wt 131 lb (59.4 kg)   LMP 03/10/1991   SpO2 98% Comment: RA  BMI 23.96 kg/m  Physical Exam Constitutional:      General: She is not in acute distress.    Appearance: She is not ill-appearing.  Eyes:     Extraocular Movements: Extraocular movements intact.  Cardiovascular:     Rate and Rhythm: Normal rate.  Musculoskeletal:        General: Normal  range of motion.     Cervical back: Normal range of motion.  Skin:    General: Skin is warm and dry.   Neurological:     General: No focal deficit present.     Mental Status: She is alert and oriented to person, place, and time.          I have independently reviewed the above radiology studies  and reviewed the findings with the patient.   Recent Lab Findings: Lab Results  Component Value Date   WBC 6.7 10/10/2023   HGB 12.7 10/10/2023   HCT 40.4 10/10/2023   PLT 120.0 (L) 10/10/2023   GLUCOSE 147 (H) 10/10/2023   CHOL 170 10/10/2023   TRIG 148.0 10/10/2023   HDL 46.80 10/10/2023   LDLDIRECT 64.0 12/08/2021   LDLCALC 93 10/10/2023   ALT 17 10/10/2023   AST 16 10/10/2023   NA 142 10/10/2023   K 4.5 10/10/2023   CL 104 10/10/2023   CREATININE 1.00 10/10/2023   BUN 18 10/10/2023   CO2 29 10/10/2023   TSH 2.09 04/26/2023   INR 0.87 08/07/2017   HGBA1C 7.7 (H) 10/10/2023    Diagnostic Studies & Laboratory data:     Recent Radiology Findings:   NM PET Image Initial (PI) Skull Base To Thigh Result Date: 10/26/2023 CLINICAL DATA:  Subsequent treatment strategy for tumor type. EXAM: NUCLEAR MEDICINE PET SKULL BASE TO THIGH TECHNIQUE: 7.0 mCi F-18 FDG was injected intravenously. Full-ring PET imaging was performed from the skull base to thigh after the radiotracer. CT data was obtained and used for attenuation correction and anatomic localization. Fasting blood glucose: 133 mg/dl COMPARISON:  None Available. FINDINGS: NECK: No hypermetabolic lymph nodes in the neck. Incidental CT findings: None. CHEST: 9 mm nodule in the posterior RIGHT lower lobe along the pleural surface on image 58 corresponds to suspicious lesion comparison low-dose lung cancer screening CT. This small nodule has faint metabolic activity less than background blood pool activity. No hypermetabolic mediastinal nodes. Incidental CT findings: None. ABDOMEN/PELVIS: No abnormal hypermetabolic activity within the liver, pancreas, adrenal glands, or spleen. No hypermetabolic lymph nodes in the abdomen or pelvis. Focal  intense metabolic activity along the medial wall of the cecum on image c 109 c. potential soft tissue nodule associated with the activity measuring 9 mm on image 109/CT series 6. Potential hypermetabolic polyp. Incidental CT findings: Postcholecystectomy. Bilateral benign renal cysts. Atherosclerotic calcification of the aorta. Insert uterus SKELETON: No focal hypermetabolic activity to suggest skeletal metastasis. Incidental CT findings: None. IMPRESSION: 1. No significant metabolic activity associated with the RIGHT lower lobe pulmonary nodule. Recommend continued CT surveillance as low-grade adenocarcinoma cannot be excluded. 2. Focal metabolic activity along the medial wall of the cecum. Recommend colonoscopy to evaluate for potential hypermetabolic polyp. 3.  Aortic Atherosclerosis (ICD10-I70.0). Electronically Signed   By: Jackquline Boxer M.D.   On: 10/26/2023 15:14     PFTs:  - FVC: 100% - FEV1: 101% -DLCO: 87%    Assessment / Plan:   78 y.o. female with a 1 cm right lower lobe pulmonary nodule.  There is minimal uptake on PET/CT.  Given her smoking history there is concern that this could be a primary lung cancer.  We discussed several options for diagnosis and treatment.  I recommended that she undergo navigational bronchoscopy with marking followed by a wedge resection.  She would like to consider this and will discuss this further with her husband.  She will call us  back with her decision.  I  spent 40 minutes with  the patient face to face in counseling and coordination of care.    Ann Collins 11/03/2023 3:39 PM

## 2023-11-14 ENCOUNTER — Encounter: Payer: Self-pay | Admitting: Pharmacist

## 2023-11-22 ENCOUNTER — Telehealth: Payer: Self-pay

## 2023-11-22 NOTE — Telephone Encounter (Signed)
 PAP: Patient assistance application for Farxiga through [employer] ([employer]) has been mailed to pt's home address on file. Provider portion of application will be faxed to provider's office.

## 2023-11-23 DIAGNOSIS — Z23 Encounter for immunization: Secondary | ICD-10-CM | POA: Diagnosis not present

## 2023-11-29 ENCOUNTER — Other Ambulatory Visit (HOSPITAL_COMMUNITY): Payer: Self-pay

## 2023-12-05 ENCOUNTER — Other Ambulatory Visit: Payer: Self-pay | Admitting: Medical Genetics

## 2023-12-05 DIAGNOSIS — Z006 Encounter for examination for normal comparison and control in clinical research program: Secondary | ICD-10-CM

## 2023-12-05 NOTE — Telephone Encounter (Signed)
 Received Patient portion PAP application [employer] Farxiga 

## 2023-12-08 NOTE — Telephone Encounter (Signed)
 PAP: Application for Marcelline Deist has been submitted to [employer] ([employer]), via fax

## 2023-12-12 ENCOUNTER — Other Ambulatory Visit: Payer: Self-pay

## 2023-12-12 DIAGNOSIS — E1165 Type 2 diabetes mellitus with hyperglycemia: Secondary | ICD-10-CM

## 2023-12-12 MED ORDER — DAPAGLIFLOZIN PROPANEDIOL 10 MG PO TABS: 10.0000 mg | ORAL_TABLET | Freq: Every morning | ORAL | 3 refills | Status: AC

## 2023-12-27 NOTE — Telephone Encounter (Signed)
 open in error

## 2024-01-11 ENCOUNTER — Other Ambulatory Visit

## 2024-01-11 ENCOUNTER — Other Ambulatory Visit: Payer: Self-pay

## 2024-01-11 DIAGNOSIS — D696 Thrombocytopenia, unspecified: Secondary | ICD-10-CM

## 2024-01-11 DIAGNOSIS — E7800 Pure hypercholesterolemia, unspecified: Secondary | ICD-10-CM

## 2024-01-11 DIAGNOSIS — E1165 Type 2 diabetes mellitus with hyperglycemia: Secondary | ICD-10-CM

## 2024-01-11 LAB — BASIC METABOLIC PANEL WITH GFR
BUN: 21 mg/dL (ref 6–23)
CO2: 29 meq/L (ref 19–32)
Calcium: 9.3 mg/dL (ref 8.4–10.5)
Chloride: 105 meq/L (ref 96–112)
Creatinine, Ser: 1.15 mg/dL (ref 0.40–1.20)
GFR: 45.74 mL/min — ABNORMAL LOW (ref >60.00)
Glucose, Bld: 176 mg/dL — ABNORMAL HIGH (ref 70–99)
Potassium: 4.4 meq/L (ref 3.5–5.1)
Sodium: 140 meq/L (ref 135–145)

## 2024-01-11 LAB — LIPID PANEL
Cholesterol: 129 mg/dL (ref 0–200)
HDL: 45.5 mg/dL (ref >39.00)
LDL Cholesterol: 65 mg/dL (ref 0–99)
NonHDL: 83.83
Total CHOL/HDL Ratio: 3
Triglycerides: 95 mg/dL (ref 0.0–149.0)
VLDL: 19 mg/dL (ref 0.0–40.0)

## 2024-01-11 LAB — CBC WITH DIFFERENTIAL/PLATELET
Basophils Absolute: 0 K/uL (ref 0.0–0.1)
Basophils Relative: 0.6 % (ref 0.0–3.0)
Eosinophils Absolute: 0.1 K/uL (ref 0.0–0.7)
Eosinophils Relative: 2.5 % (ref 0.0–5.0)
HCT: 38.6 % (ref 36.0–46.0)
Hemoglobin: 12.3 g/dL (ref 12.0–15.0)
Lymphocytes Relative: 55.7 % — ABNORMAL HIGH (ref 12.0–46.0)
Lymphs Abs: 3.2 K/uL (ref 0.7–4.0)
MCHC: 32 g/dL (ref 30.0–36.0)
MCV: 85.3 fl (ref 78.0–100.0)
Monocytes Absolute: 0.3 K/uL (ref 0.1–1.0)
Monocytes Relative: 6.1 % (ref 3.0–12.0)
Neutro Abs: 2 K/uL (ref 1.4–7.7)
Neutrophils Relative %: 35.1 % — ABNORMAL LOW (ref 43.0–77.0)
Platelets: 113 K/uL — ABNORMAL LOW (ref 150.0–400.0)
RBC: 4.52 Mil/uL (ref 3.87–5.11)
RDW: 17.3 % — ABNORMAL HIGH (ref 11.5–15.5)
WBC: 5.7 K/uL (ref 4.0–10.5)

## 2024-01-11 LAB — HEPATIC FUNCTION PANEL
ALT: 17 U/L (ref 0–35)
AST: 17 U/L (ref 0–37)
Albumin: 4 g/dL (ref 3.5–5.2)
Alkaline Phosphatase: 73 U/L (ref 39–117)
Bilirubin, Direct: 0.1 mg/dL (ref 0.0–0.3)
Total Bilirubin: 0.5 mg/dL (ref 0.2–1.2)
Total Protein: 6.6 g/dL (ref 6.0–8.3)

## 2024-01-11 LAB — HEMOGLOBIN A1C: Hgb A1c MFr Bld: 7.7 % — ABNORMAL HIGH (ref 4.6–6.5)

## 2024-01-12 ENCOUNTER — Ambulatory Visit: Payer: Self-pay | Admitting: Internal Medicine

## 2024-01-12 ENCOUNTER — Other Ambulatory Visit

## 2024-01-15 ENCOUNTER — Encounter: Payer: Self-pay | Admitting: Internal Medicine

## 2024-01-15 ENCOUNTER — Ambulatory Visit: Admitting: Internal Medicine

## 2024-01-15 VITALS — BP 142/72 | HR 73 | Temp 98.3°F | Ht 62.0 in | Wt 132.8 lb

## 2024-01-15 DIAGNOSIS — I10 Essential (primary) hypertension: Secondary | ICD-10-CM | POA: Diagnosis not present

## 2024-01-15 DIAGNOSIS — R7989 Other specified abnormal findings of blood chemistry: Secondary | ICD-10-CM | POA: Diagnosis not present

## 2024-01-15 DIAGNOSIS — E1165 Type 2 diabetes mellitus with hyperglycemia: Secondary | ICD-10-CM | POA: Diagnosis not present

## 2024-01-15 DIAGNOSIS — E039 Hypothyroidism, unspecified: Secondary | ICD-10-CM | POA: Diagnosis not present

## 2024-01-15 DIAGNOSIS — F439 Reaction to severe stress, unspecified: Secondary | ICD-10-CM | POA: Diagnosis not present

## 2024-01-15 DIAGNOSIS — E7800 Pure hypercholesterolemia, unspecified: Secondary | ICD-10-CM

## 2024-01-15 DIAGNOSIS — Z7984 Long term (current) use of oral hypoglycemic drugs: Secondary | ICD-10-CM | POA: Diagnosis not present

## 2024-01-15 DIAGNOSIS — N1831 Chronic kidney disease, stage 3a: Secondary | ICD-10-CM | POA: Diagnosis not present

## 2024-01-15 DIAGNOSIS — D696 Thrombocytopenia, unspecified: Secondary | ICD-10-CM | POA: Diagnosis not present

## 2024-01-15 DIAGNOSIS — Z87891 Personal history of nicotine dependence: Secondary | ICD-10-CM | POA: Diagnosis not present

## 2024-01-15 MED ORDER — LISINOPRIL 5 MG PO TABS: 5.0000 mg | ORAL_TABLET | Freq: Every day | ORAL | 1 refills | Status: DC

## 2024-01-15 MED ORDER — PANTOPRAZOLE SODIUM 40 MG PO TBEC: 40.0000 mg | DELAYED_RELEASE_TABLET | Freq: Every day | ORAL | 1 refills | Status: AC | PRN

## 2024-01-15 MED ORDER — LEVOTHYROXINE SODIUM 112 MCG PO TABS: ORAL_TABLET | ORAL | 1 refills | Status: AC

## 2024-01-15 NOTE — Progress Notes (Signed)
 Subjective:    Patient ID: Ann Collins, female    DOB: 18-Dec-1945, 78 y.o.   MRN: [id number]  Patient here for  Chief Complaint  Patient presents with   Medical Management of Chronic Issues    Discuss referral     HPI Her for a scheduled follow up - follow up diabetes, hypertension and hypercholesterolemia. Did see GI 08/15/23 - f/u - constipation. Recommended miralax and stool softeners regularly. Due colonoscopy. Elected to hold. Saw hematology 10/17/22 - f/u thrombocytopenia. Stable. Released. Recommended to check cbc 1-2x/year. Seeing pulmonary f/u RLL solid subpleural nodule. Last visit 10/23/23 - nodule growing in size. Recommended referral to Dr Shyrl. Saw Dr Lightfoot 11/03/23 - recommended navigational bronchoscopy with marking followed by a wedge resection. Reports she is off ozempic  now. Has been off for three weeks. Discussed following sugars. No chest pain. Breathing stable overall. Had questions regarding excisional biopsy. Continues to smoke. Increased stress. Overall reports she is handling things relatively well. Does not feel needs further intervention.    Past Medical History:  Diagnosis Date   Abdominal hernia    CAD (coronary artery disease)    a. LHC 7/19: LM nl, p-mLAD 100% w/ right to left collaterals, OM2 90% (2 mm), 1st LPL 40%, medical management   Diabetes mellitus (HCC)    Diverticulosis    GERD (gastroesophageal reflux disease)    Hematuria    Hypercholesterolemia    Hypertension    Hypothyroidism    Kidney stones    Past Surgical History:  Procedure Laterality Date   APPENDECTOMY     BREAST BIOPSY  1969   right   CHOLECYSTECTOMY  2005   COLONOSCOPY     COLONOSCOPY WITH PROPOFOL  N/A 09/19/2017   Procedure: COLONOSCOPY WITH PROPOFOL ;  Surgeon: Toledo, Ladell POUR, MD;  Location: [facility];  Service: Gastroenterology;  Laterality: N/A;   ESOPHAGOGASTRODUODENOSCOPY (EGD) WITH PROPOFOL  N/A 09/19/2017   Procedure: ESOPHAGOGASTRODUODENOSCOPY (EGD)  WITH PROPOFOL ;  Surgeon: Toledo, Ladell POUR, MD;  Location: [facility];  Service: Gastroenterology;  Laterality: N/A;   LEFT HEART CATH AND CORONARY ANGIOGRAPHY Left 08/07/2017   Procedure: LEFT HEART CATH AND CORONARY ANGIOGRAPHY;  Surgeon: Darron Deatrice LABOR, MD;  Location: [facility];  Service: Cardiovascular;  Laterality: Left;   UPPER GI ENDOSCOPY     Family History  Problem Relation Age of Onset   Brain cancer Mother        history of brain tumor   Lung disease Father    Cancer Sister        Spindle cell carcinoma   Diabetes Brother        x2   Bipolar disorder Son    Alcohol abuse Son    Breast cancer Neg Hx    Colon cancer Neg Hx    Social History   Socioeconomic History   Marital status: Married    Spouse name: Not on file   Number of children: Not on file   Years of education: Not on file   Highest education level: 12th grade  Occupational History   Not on file  Tobacco Use   Smoking status: Every Day    Current packs/day: 1.00    Average packs/day: 1 pack/day for 50.0 years (50.0 ttl pk-yrs)    Types: Cigarettes   Smokeless tobacco: Never   Tobacco comments:    Smokes 1PPD - khj 10/23/2023        Started smoking as a teenager.     Has smoked 1  PPD at her heaviest  Vaping Use   Vaping status: Never Used  Substance and Sexual Activity   Alcohol use: No    Alcohol/week: 0.0 standard drinks of alcohol   Drug use: No   Sexual activity: Not on file  Other Topics Concern   Not on file  Social History Narrative   Not on file   Social Drivers of Health   Tobacco Use: High Risk (01/21/2024)   Patient History    Smoking Tobacco Use: Every Day    Smokeless Tobacco Use: Never    Passive Exposure: Not on file  Financial Resource Strain: Low Risk (08/06/2023)   Overall Financial Resource Strain (CARDIA)    Difficulty of Paying Living Expenses: Not very hard  Food Insecurity: No Food Insecurity (08/06/2023)   Epic    Worried About Radiation Protection Practitioner of Food  in the Last Year: Never true    Ran Out of Food in the Last Year: Never true  Transportation Needs: No Transportation Needs (08/06/2023)   Epic    Lack of Transportation (Medical): No    Lack of Transportation (Non-Medical): No  Physical Activity: Insufficiently Active (08/06/2023)   Exercise Vital Sign    Days of Exercise per Week: 3 days    Minutes of Exercise per Session: 40 min  Stress: No Stress Concern Present (08/06/2023)   [employer] of Occupational Health - Occupational Stress Questionnaire    Feeling of Stress: Not at all  Social Connections: Socially Integrated (08/06/2023)   Social Connection and Isolation Panel    Frequency of Communication with Friends and Family: More than three times a week    Frequency of Social Gatherings with Friends and Family: More than three times a week    Attends Religious Services: More than 4 times per year    Active Member of Clubs or Organizations: Yes    Attends Banker Meetings: More than 4 times per year    Marital Status: Married  Depression (PHQ2-9): Low Risk (10/12/2023)   Depression (PHQ2-9)    PHQ-2 Score: 0  Alcohol Screen: Low Risk (06/07/2023)   Alcohol Screen    Last Alcohol Screening Score (AUDIT): 0  Housing: Low Risk (08/06/2023)   Epic    Unable to Pay for Housing in the Last Year: No    Number of Times Moved in the Last Year: 0    Homeless in the Last Year: No  Utilities: Not At Risk (06/07/2023)   [employer] Utilities    Threatened with loss of utilities: No  Health Literacy: Adequate Health Literacy (06/07/2023)   B1300 Health Literacy    Frequency of need for help with medical instructions: Never     Review of Systems  Constitutional:  Negative for appetite change and unexpected weight change.  HENT:  Negative for congestion and sinus pressure.   Respiratory:  Negative for cough and chest tightness.        Breathing stable.   Cardiovascular:  Negative for chest pain, palpitations and leg swelling.   Gastrointestinal:  Negative for abdominal pain, nausea and vomiting.  Genitourinary:  Negative for difficulty urinating and dysuria.  Musculoskeletal:  Negative for joint swelling and myalgias.  Skin:  Negative for color change and rash.  Neurological:  Negative for dizziness and headaches.  Psychiatric/Behavioral:  Negative for agitation and dysphoric mood.        Objective:     BP 136/72   Pulse 73   Temp 98.3 F (36.8 C) (Oral)   Ht  5' 2 (1.575 m)   Wt 132 lb 12.8 oz (60.2 kg)   LMP 03/10/1991   SpO2 99%   BMI 24.29 kg/m  Wt Readings from Last 3 Encounters:  01/15/24 132 lb 12.8 oz (60.2 kg)  11/03/23 131 lb (59.4 kg)  11/02/23 131 lb 9.6 oz (59.7 kg)    Physical Exam Vitals reviewed.  Constitutional:      General: She is not in acute distress.    Appearance: Normal appearance.  HENT:     Head: Normocephalic and atraumatic.     Right Ear: External ear normal.     Left Ear: External ear normal.     Mouth/Throat:     Pharynx: No oropharyngeal exudate or posterior oropharyngeal erythema.  Eyes:     General: No scleral icterus.       Right eye: No discharge.        Left eye: No discharge.     Conjunctiva/sclera: Conjunctivae normal.  Neck:     Thyroid : No thyromegaly.  Cardiovascular:     Rate and Rhythm: Normal rate and regular rhythm.  Pulmonary:     Effort: No respiratory distress.     Breath sounds: Normal breath sounds. No wheezing.  Abdominal:     General: Bowel sounds are normal.     Palpations: Abdomen is soft.     Tenderness: There is no abdominal tenderness.  Musculoskeletal:        General: No swelling or tenderness.     Cervical back: Neck supple. No tenderness.  Lymphadenopathy:     Cervical: No cervical adenopathy.  Skin:    Findings: No erythema or rash.  Neurological:     Mental Status: She is alert.  Psychiatric:        Mood and Affect: Mood normal.        Behavior: Behavior normal.         Outpatient Encounter Medications as  of 01/15/2024  Medication Sig   Accu-Chek Softclix Lancets lancets Use as instructed   aspirin  81 MG tablet Take 81 mg by mouth daily.   atorvastatin  (LIPITOR) 20 MG tablet TAKE 1 TABLET(20 MG) BY MOUTH DAILY   blood glucose meter kit and supplies Dispense based on patient and insurance preference. Use up to four times daily as directed. (FOR ICD-10 E10.9, E11.9).   carvedilol  (COREG ) 3.125 MG tablet TAKE 1 TABLET(3.125 MG) BY MOUTH TWICE DAILY   dapagliflozin  propanediol (FARXIGA ) 10 MG TABS tablet Take 1 tablet (10 mg total) by mouth in the morning.   glucose blood test strip Use as instructed to blood sugar twice daily   levothyroxine  (SYNTHROID ) 112 MCG tablet TAKE 1 TABLET(112 MCG) BY MOUTH DAILY BEFORE AND BREAKFAST   lisinopril  (ZESTRIL ) 5 MG tablet Take 1 tablet (5 mg total) by mouth daily.   pantoprazole  (PROTONIX ) 40 MG tablet Take 1 tablet (40 mg total) by mouth daily as needed.   Semaglutide ,0.25 or 0.5MG /DOS, (OZEMPIC , 0.25 OR 0.5 MG/DOSE,) 2 MG/3ML SOPN Inject 0.25 mg + 10 clicks weekly   triamcinolone  cream (KENALOG ) 0.1 % Apply 1 Application topically 2 (two) times daily.   vitamin B-12 (CYANOCOBALAMIN ) 1000 MCG tablet Take 1,000 mcg by mouth daily.   [DISCONTINUED] levothyroxine  (SYNTHROID ) 112 MCG tablet TAKE 1 TABLET(112 MCG) BY MOUTH DAILY BEFORE AND BREAKFAST   [DISCONTINUED] lisinopril  (ZESTRIL ) 5 MG tablet Take 1 tablet (5 mg total) by mouth daily.   [DISCONTINUED] pantoprazole  (PROTONIX ) 40 MG tablet Take 1 tablet (40 mg total) by mouth daily as needed.  No facility-administered encounter medications on file as of 01/15/2024.     Lab Results  Component Value Date   WBC 5.7 01/11/2024   HGB 12.3 01/11/2024   HCT 38.6 01/11/2024   PLT 113.0 (L) 01/11/2024   GLUCOSE 176 (H) 01/11/2024   CHOL 129 01/11/2024   TRIG 95.0 01/11/2024   HDL 45.50 01/11/2024   LDLDIRECT 64.0 12/08/2021   LDLCALC 65 01/11/2024   ALT 17 01/11/2024   AST 17 01/11/2024   NA 140 01/11/2024    K 4.4 01/11/2024   CL 105 01/11/2024   CREATININE 1.15 01/11/2024   BUN 21 01/11/2024   CO2 29 01/11/2024   TSH 2.09 04/26/2023   INR 0.87 08/07/2017   HGBA1C 7.7 (H) 01/11/2024   MICROALBUR <0.7 10/10/2023    NM PET Image Initial (PI) Skull Base To Thigh Result Date: 10/26/2023 CLINICAL DATA:  Subsequent treatment strategy for tumor type. EXAM: NUCLEAR MEDICINE PET SKULL BASE TO THIGH TECHNIQUE: 7.0 mCi F-18 FDG was injected intravenously. Full-ring PET imaging was performed from the skull base to thigh after the radiotracer. CT data was obtained and used for attenuation correction and anatomic localization. Fasting blood glucose: 133 mg/dl COMPARISON:  None Available. FINDINGS: NECK: No hypermetabolic lymph nodes in the neck. Incidental CT findings: None. CHEST: 9 mm nodule in the posterior RIGHT lower lobe along the pleural surface on image 58 corresponds to suspicious lesion comparison low-dose lung cancer screening CT. This small nodule has faint metabolic activity less than background blood pool activity. No hypermetabolic mediastinal nodes. Incidental CT findings: None. ABDOMEN/PELVIS: No abnormal hypermetabolic activity within the liver, pancreas, adrenal glands, or spleen. No hypermetabolic lymph nodes in the abdomen or pelvis. Focal intense metabolic activity along the medial wall of the cecum on image c 109 c. potential soft tissue nodule associated with the activity measuring 9 mm on image 109/CT series 6. Potential hypermetabolic polyp. Incidental CT findings: Postcholecystectomy. Bilateral benign renal cysts. Atherosclerotic calcification of the aorta. Insert uterus SKELETON: No focal hypermetabolic activity to suggest skeletal metastasis. Incidental CT findings: None. IMPRESSION: 1. No significant metabolic activity associated with the RIGHT lower lobe pulmonary nodule. Recommend continued CT surveillance as low-grade adenocarcinoma cannot be excluded. 2. Focal metabolic activity along  the medial wall of the cecum. Recommend colonoscopy to evaluate for potential hypermetabolic polyp. 3.  Aortic Atherosclerosis (ICD10-I70.0). Electronically Signed   By: Jackquline Boxer M.D.   On: 10/26/2023 15:14       Assessment & Plan:  Thrombocytopenia Assessment & Plan: Saw hematology 10/17/22 - f/u thrombocytopenia. Stable.  Released.  Recommended check cbc 1-2x/year - just checked 01/11/24 - stable 113.    Hypothyroidism, unspecified type Assessment & Plan: On thyroid  replacement. Follow tsh.   Orders: -     Levothyroxine  Sodium; TAKE 1 TABLET(112 MCG) BY MOUTH DAILY BEFORE AND BREAKFAST  Dispense: 90 tablet; Refill: 1  Primary hypertension Assessment & Plan: Continue lisinopril . Blood pressure as outlined. Recheck improved. No changes today. Follow pressures. Follow metabolic panel.   Orders: -     Lisinopril ; Take 1 tablet (5 mg total) by mouth daily.  Dispense: 90 tablet; Refill: 1  Type 2 diabetes mellitus with hyperglycemia, without long-term current use of insulin  (HCC) Assessment & Plan: On farxiga .  Off ozempic . Discussed diet and exercise.  Tolerating.  Follow sugars.  Follow met b and A1c as outlined.  Lab Results  Component Value Date   HGBA1C 7.7 (H) 01/11/2024      Hypercholesterolemia Assessment & Plan: Continue  lipitor.  Follow lipid panel and liver function tests.  Discussed recent fasting lipid results. LDL improved 65. No change in medication today.     Stress Assessment & Plan: Increased stress. Discussed. Overall appears to be handling things relatively well. Follow.    Personal history of tobacco use, presenting hazards to health Assessment & Plan: Have discussed quitting. Desires not to quit.    Lung nodule Assessment & Plan: Seeing pulmonary f/u RLL solid subpleural nodule. Last visit 10/23/23 - nodule growing in size. Recommended referral to Dr Shyrl. Saw Dr Lightfoot 11/03/23 - recommended navigational bronchoscopy with marking  followed by a wedge resection.    Low vitamin B12 level Assessment & Plan: B12 level rechecked 07/2023 - 458 - improved. Follow.    CKD stage 3a, GFR 45-59 ml/min (HCC) Assessment & Plan: Avoid antiinflammatory medication. Stay hydrated. Continue lisinopril . Follow metabolic panel.    Carotid artery disease, unspecified laterality, unspecified type Assessment & Plan: Carotid ultrasound 06/02/21:  Right Carotid: Velocities in the right ICA are consistent with a 1-39% stenosis.                Non-hemodynamically significant plaque <50% noted in the CCA. The                ECA appears <50% stenosed.   Left Carotid: There was no evidence of thrombus, dissection, atherosclerotic               plaque or stenosis in the cervical carotid system.  Continue lipitor.    Other orders -     Pantoprazole  Sodium; Take 1 tablet (40 mg total) by mouth daily as needed.  Dispense: 90 tablet; Refill: 1     Allena Hamilton, MD

## 2024-01-21 ENCOUNTER — Encounter: Payer: Self-pay | Admitting: Internal Medicine

## 2024-01-21 NOTE — Assessment & Plan Note (Signed)
 Saw hematology 10/17/22 - f/u thrombocytopenia. Stable.  Released.  Recommended check cbc 1-2x/year - just checked 01/11/24 - stable 113.

## 2024-01-21 NOTE — Assessment & Plan Note (Signed)
Increased stress.  Discussed.  Overall appears to be handling things relatively well.  Follow.  

## 2024-01-21 NOTE — Assessment & Plan Note (Signed)
 B12 level rechecked 07/2023 - 458 - improved. Follow.

## 2024-01-21 NOTE — Assessment & Plan Note (Signed)
 Avoid antiinflammatory medication. Stay hydrated. Continue lisinopril . Follow metabolic panel.

## 2024-01-21 NOTE — Assessment & Plan Note (Signed)
 Have discussed quitting. Desires not to quit.

## 2024-01-21 NOTE — Assessment & Plan Note (Signed)
Carotid ultrasound 06/02/21:  Right Carotid: Velocities in the right ICA are consistent with a 1-39% stenosis.                Non-hemodynamically significant plaque <50% noted in the CCA. The                ECA appears <50% stenosed.   Left Carotid: There was no evidence of thrombus, dissection, atherosclerotic               plaque or stenosis in the cervical carotid system.  Continue lipitor.  

## 2024-01-21 NOTE — Assessment & Plan Note (Signed)
 Continue lipitor.  Follow lipid panel and liver function tests.  Discussed recent fasting lipid results. LDL improved 65. No change in medication today.

## 2024-01-21 NOTE — Assessment & Plan Note (Signed)
 Continue lisinopril . Blood pressure as outlined. Recheck improved. No changes today. Follow pressures. Follow metabolic panel.

## 2024-01-21 NOTE — Assessment & Plan Note (Signed)
 Seeing pulmonary f/u RLL solid subpleural nodule. Last visit 10/23/23 - nodule growing in size. Recommended referral to Dr Shyrl. Saw Dr Lightfoot 11/03/23 - recommended navigational bronchoscopy with marking followed by a wedge resection.

## 2024-01-21 NOTE — Assessment & Plan Note (Signed)
 On farxiga .  Off ozempic . Discussed diet and exercise.  Tolerating.  Follow sugars.  Follow met b and A1c as outlined.  Lab Results  Component Value Date   HGBA1C 7.7 (H) 01/11/2024

## 2024-01-21 NOTE — Assessment & Plan Note (Signed)
 On thyroid replacement.  Follow tsh.

## 2024-01-23 ENCOUNTER — Encounter: Payer: Self-pay | Admitting: Pulmonary Disease

## 2024-01-23 ENCOUNTER — Ambulatory Visit: Admitting: Pulmonary Disease

## 2024-01-23 VITALS — BP 138/60 | HR 62 | Temp 98.6°F | Ht 62.0 in | Wt 134.4 lb

## 2024-01-23 DIAGNOSIS — I10 Essential (primary) hypertension: Secondary | ICD-10-CM | POA: Diagnosis not present

## 2024-01-23 DIAGNOSIS — R911 Solitary pulmonary nodule: Secondary | ICD-10-CM

## 2024-01-23 DIAGNOSIS — E039 Hypothyroidism, unspecified: Secondary | ICD-10-CM | POA: Diagnosis not present

## 2024-01-23 DIAGNOSIS — R918 Other nonspecific abnormal finding of lung field: Secondary | ICD-10-CM | POA: Diagnosis not present

## 2024-01-23 DIAGNOSIS — E785 Hyperlipidemia, unspecified: Secondary | ICD-10-CM | POA: Diagnosis not present

## 2024-01-23 DIAGNOSIS — F17210 Nicotine dependence, cigarettes, uncomplicated: Secondary | ICD-10-CM | POA: Diagnosis not present

## 2024-01-23 DIAGNOSIS — K219 Gastro-esophageal reflux disease without esophagitis: Secondary | ICD-10-CM | POA: Diagnosis not present

## 2024-01-23 NOTE — Progress Notes (Signed)
 Synopsis: Referred in by Glendia Shad, MD   Subjective:   PATIENT ID: Ann Collins GENDER: female DOB: 06-08-1945, MRN: [id number]  Chief Complaint  Patient presents with   Nodule    No SOB or wheezing. Cough with clear sputum.    HPI Ann Collins is a pleasant 78 year old female patient with a past medical history of tobacco use, hypothyroidism, hypertension, GERD and hyperlipidemia presenting today to the pulmonary clinic for further evaluation of pulmonary nodule.  She is enrolled in the lung cancer screening program and underwent an LDCT in 2024 that showed a new right lower lobe subpleural nodule measuring 7.5 mm at the time this was followed in 3 months interval and demonstrated stability however after roughly 9 months repeat low-dose CT was obtained and showed increase in size of that right lower lobe nodule now measuring 10 mm.  This prompted a PET scan that has not been officially read but to my eye there is mild FDG avidity with an SUV of 1.5.  She denies any respiratory symptoms.  She denies any loss of appetite but she is on Ozempic .  She denies any hemoptysis.  She is up-to-date on her breast cancer screening colon cancer screening.  She does not have any past medical history of malignancy.  Family history -father with COPD who was a heavy smoker.  Social history -unfortunately she continues to smoke and smokes 1 pack/day for 60 years.   OV 01/23/2024 - Ann Collins is here to follow up on her pulmonary nodules. She saw Dr. Shyrl who recommended a Nav bronch with marking followed by resection. However given that the patient continues to smoke this was held. Her last imaging was 3 months ago with a PET scan, I will order a rpeat CT super D to evaluate her nodule progression and discuss moving with Nav bronch for diagnosis. She is currenlty dealing with an illness in the family, her daughter was admitted to the ICU with lithium toxicity and is on the   ventilator.   Family History  Problem Relation Age of Onset   Brain cancer Mother        history of brain tumor   Lung disease Father    Cancer Sister        Spindle cell carcinoma   Diabetes Brother        x2   Bipolar disorder Son    Alcohol abuse Son    Breast cancer Neg Hx    Colon cancer Neg Hx      Social History   Socioeconomic History   Marital status: Married    Spouse name: Not on file   Number of children: Not on file   Years of education: Not on file   Highest education level: 12th grade  Occupational History   Not on file  Tobacco Use   Smoking status: Every Day    Current packs/day: 0.25    Average packs/day: 0.3 packs/day for 50.0 years (12.5 ttl pk-yrs)    Types: Cigarettes   Smokeless tobacco: Never   Tobacco comments:    Smokes 0.25 PPD - khj 01/23/2024        Started smoking as a teenager.     Has smoked 1 PPD at her heaviest  Vaping Use   Vaping status: Never Used  Substance and Sexual Activity   Alcohol use: No    Alcohol/week: 0.0 standard drinks of alcohol   Drug use: No   Sexual activity: Not on file  Other Topics Concern   Not on file  Social History Narrative   Not on file   Social Drivers of Health   Tobacco Use: High Risk (01/21/2024)   Patient History    Smoking Tobacco Use: Every Day    Smokeless Tobacco Use: Never    Passive Exposure: Not on file  Financial Resource Strain: Low Risk (08/06/2023)   Overall Financial Resource Strain (CARDIA)    Difficulty of Paying Living Expenses: Not very hard  Food Insecurity: No Food Insecurity (08/06/2023)   Epic    Worried About Radiation Protection Practitioner of Food in the Last Year: Never true    Ran Out of Food in the Last Year: Never true  Transportation Needs: No Transportation Needs (08/06/2023)   Epic    Lack of Transportation (Medical): No    Lack of Transportation (Non-Medical): No  Physical Activity: Insufficiently Active (08/06/2023)   Exercise Vital Sign    Days of Exercise per Week: 3 days     Minutes of Exercise per Session: 40 min  Stress: No Stress Concern Present (08/06/2023)   [employer] of Occupational Health - Occupational Stress Questionnaire    Feeling of Stress: Not at all  Social Connections: Socially Integrated (08/06/2023)   Social Connection and Isolation Panel    Frequency of Communication with Friends and Family: More than three times a week    Frequency of Social Gatherings with Friends and Family: More than three times a week    Attends Religious Services: More than 4 times per year    Active Member of [employer] or Organizations: Yes    Attends Banker Meetings: More than 4 times per year    Marital Status: Married  Catering Manager Violence: Not At Risk (06/07/2023)   Humiliation, Afraid, Rape, and Kick questionnaire    Fear of Current or Ex-Partner: No    Emotionally Abused: No    Physically Abused: No    Sexually Abused: No  Depression (PHQ2-9): Low Risk (10/12/2023)   Depression (PHQ2-9)    PHQ-2 Score: 0  Alcohol Screen: Low Risk (06/07/2023)   Alcohol Screen    Last Alcohol Screening Score (AUDIT): 0  Housing: Low Risk (08/06/2023)   Epic    Unable to Pay for Housing in the Last Year: No    Number of Times Moved in the Last Year: 0    Homeless in the Last Year: No  Utilities: Not At Risk (06/07/2023)   AHC Utilities    Threatened with loss of utilities: No  Health Literacy: Adequate Health Literacy (06/07/2023)   B1300 Health Literacy    Frequency of need for help with medical instructions: Never        Objective:   Vitals:   01/23/24 1105  BP: 138/60  Pulse: 62  Temp: 98.6 F (37 C)  SpO2: 99%  Weight: 134 lb 6.4 oz (61 kg)  Height: 5' 2 (1.575 m)   99% on RA BMI Readings from Last 3 Encounters:  01/23/24 24.58 kg/m  01/15/24 24.29 kg/m  11/03/23 23.96 kg/m   Wt Readings from Last 3 Encounters:  01/23/24 134 lb 6.4 oz (61 kg)  01/15/24 132 lb 12.8 oz (60.2 kg)  11/03/23 131 lb (59.4 kg)    Physical  Exam GEN: NAD, Healthy Appearing HEENT: Supple Neck, Reactive Pupils, EOMI  CVS: Normal S1, Normal S2, RRR, No murmurs or ES appreciated  Lungs: Clear bilateral air entry.  Abdomen: Soft, non tender, non distended, + BS  Extremities: Warm and  well perfused, No edema   Labs and imaging were reviewed.   Ancillary Information   CBC    Component Value Date/Time   WBC 5.7 01/11/2024 1010   RBC 4.52 01/11/2024 1010   HGB 12.3 01/11/2024 1010   HGB 12.3 09/28/2022 1156   HCT 38.6 01/11/2024 1010   PLT 113.0 (L) 01/11/2024 1010   PLT 113 (L) 09/28/2022 1156   MCV 85.3 01/11/2024 1010   MCH 27.6 09/28/2022 1156   MCHC 32.0 01/11/2024 1010   RDW 17.3 (H) 01/11/2024 1010   LYMPHSABS 3.2 01/11/2024 1010   MONOABS 0.3 01/11/2024 1010   EOSABS 0.1 01/11/2024 1010   BASOSABS 0.0 01/11/2024 1010       Latest Ref Rng & Units 11/02/2023    3:33 PM  PFT Results  FVC-Pre L 2.51   FVC-Predicted Pre % 100   FVC-Post L 2.58   FVC-Predicted Post % 103   Pre FEV1/FVC % % 75   Post FEV1/FCV % % 70   FEV1-Pre L 1.89   FEV1-Predicted Pre % 101   FEV1-Post L 1.82   DLCO uncorrected ml/min/mmHg 15.30   DLCO UNC% % 87   DLVA Predicted % 86   TLC L 5.17   TLC % Predicted % 108   RV % Predicted % 117      Assessment & Plan:  Ann Collins is a pleasant 78 year old female patient with a past medical history of tobacco use, hypothyroidism, hypertension, GERD and hyperlipidemia presenting today to the pulmonary clinic for further evaluation of pulmonary nodule.  #RLL solid subpleural nodule  This is growing in size now measuring 10mm from 7.58mm. 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 PET scan with very mild FDG avidity.  She saw Dr. Shyrl who recommended a Nav bronch with marking followed by resection. However given that the patient continues to smoke this was held. Her last imaging was 3 months ago with a PET scan, I will order a rpeat CT super D  to evaluate her nodule progression and discuss moving with Nav bronch for diagnosis.  #RLL GGO superior to the solid nodule  This has been stable for the last 2 years. Continue to monitor to prove stability for 5 years.   RTC 4 weeks.   I personally spent a total of 30 minutes in the care of the patient today including preparing to see the patient, getting/reviewing separately obtained history, performing a medically appropriate exam/evaluation, counseling and educating, documenting clinical information in the EHR, independently interpreting results, and communicating results.   Darrin Barn, MD [facility] 01/23/2024 1:08 PM

## 2024-02-06 NOTE — Telephone Encounter (Signed)
 PAP: Patient assistance application for Farxiga  has been approved by PAP Companies: [employer] from 02/08/2024 to 02/06/2025. Medication should be delivered to PAP Delivery: Home. For further shipping updates, please contact [employer] ([employer]) at [phone]. Patient ID is: [id number].

## 2024-02-12 ENCOUNTER — Ambulatory Visit
Admission: RE | Admit: 2024-02-12 | Discharge: 2024-02-12 | Disposition: A | Discharge status: Home / Self Care | Source: Ambulatory Visit | Attending: Pulmonary Disease | Admitting: Pulmonary Disease

## 2024-02-12 DIAGNOSIS — R911 Solitary pulmonary nodule: Secondary | ICD-10-CM | POA: Diagnosis present

## 2024-02-26 ENCOUNTER — Ambulatory Visit: Admitting: Pulmonary Disease

## 2024-02-26 ENCOUNTER — Encounter: Payer: Self-pay | Admitting: Pulmonary Disease

## 2024-02-26 VITALS — BP 138/68 | HR 52 | Temp 98.0°F | Ht 62.0 in | Wt 191.2 lb

## 2024-02-26 DIAGNOSIS — F17210 Nicotine dependence, cigarettes, uncomplicated: Secondary | ICD-10-CM

## 2024-02-26 DIAGNOSIS — R911 Solitary pulmonary nodule: Secondary | ICD-10-CM

## 2024-02-26 DIAGNOSIS — R918 Other nonspecific abnormal finding of lung field: Secondary | ICD-10-CM

## 2024-02-26 NOTE — Progress Notes (Signed)
 "  Synopsis: Referred in by Glendia Shad, MD   Subjective:   PATIENT ID: Ann Collins GENDER: female DOB: February 14, 1945, MRN: [id number]  Chief Complaint  Patient presents with   Follow-up    Nodule. CT on 02/12/2024.    HPI Ann Collins is a pleasant 79 year old female patient with a past medical history of tobacco use, hypothyroidism, hypertension, GERD and hyperlipidemia presenting today to the pulmonary clinic for further evaluation of pulmonary nodule.  She is enrolled in the lung cancer screening program and underwent an LDCT in 2024 that showed a new right lower lobe subpleural nodule measuring 7.5 mm at the time this was followed in 3 months interval and demonstrated stability however after roughly 9 months repeat low-dose CT was obtained and showed increase in size of that right lower lobe nodule now measuring 10 mm.  This prompted a PET scan that has not been officially read but to my eye there is mild FDG avidity with an SUV of 1.5.  She denies any respiratory symptoms.  She denies any loss of appetite but she is on Ozempic .  She denies any hemoptysis.  She is up-to-date on her breast cancer screening colon cancer screening.  She does not have any past medical history of malignancy.  Family history -father with COPD who was a heavy smoker.  Social history -unfortunately she continues to smoke and smokes 1 pack/day for 60 years.   OV 01/23/2024 - Ms . Collins is here to follow up on her pulmonary nodules. She saw Dr. Shyrl who recommended a Nav bronch with marking followed by resection. However given that the patient continues to smoke this was held. Her last imaging was 3 months ago with a PET scan, I will order a rpeat CT super D to evaluate her nodule progression and discuss moving with Nav bronch for diagnosis. She is currenlty dealing with an illness in the family, her daughter was admitted to the ICU with lithium toxicity and is on the  ventilator.   OV  02/26/2024 - Ann Collins is here to follow up regarding her pulmonary nodule. We had a repeat CT chest super D on 02/2024 [6 months interval] which showed a stable RLL supleural solid nodule and a stable ground glass nodule. Given lack of PET activity, stable nodules we discussed and agreed on continuing radiological monitoring which she is agreeable with. I will plan to repeat a CT chest wo contrast in 6 months and follow up with her at that time. We also reviewed her PFTs which appeared normal and discussed importance of smoking cessation but currently is going through a lot of stress and is not ready to quit but motivated to cut back.   Family History  Problem Relation Age of Onset   Brain cancer Mother        history of brain tumor   Lung disease Father    Cancer Sister        Spindle cell carcinoma   Diabetes Brother        x2   Bipolar disorder Son    Alcohol abuse Son    Breast cancer Neg Hx    Colon cancer Neg Hx      Social History   Socioeconomic History   Marital status: Married    Spouse name: Not on file   Number of children: Not on file   Years of education: Not on file   Highest education level: 12th grade  Occupational History   Not on file  Tobacco Use   Smoking status: Every Day    Current packs/day: 0.50    Types: Cigarettes   Smokeless tobacco: Never   Tobacco comments:    Smokes 0.5 PPD - khj 02/26/2024        Started smoking as a teenager.     Has smoked 1 PPD at her heaviest  Vaping Use   Vaping status: Never Used  Substance and Sexual Activity   Alcohol use: No    Alcohol/week: 0.0 standard drinks of alcohol   Drug use: No   Sexual activity: Not on file  Other Topics Concern   Not on file  Social History Narrative   Not on file   Social Drivers of Health   Tobacco Use: High Risk (02/26/2024)   Patient History    Smoking Tobacco Use: Every Day    Smokeless Tobacco Use: Never    Passive Exposure: Not on file  Financial Resource Strain: Low  Risk (08/06/2023)   Overall Financial Resource Strain (CARDIA)    Difficulty of Paying Living Expenses: Not very hard  Food Insecurity: No Food Insecurity (08/06/2023)   Epic    Worried About Radiation Protection Practitioner of Food in the Last Year: Never true    Ran Out of Food in the Last Year: Never true  Transportation Needs: No Transportation Needs (08/06/2023)   Epic    Lack of Transportation (Medical): No    Lack of Transportation (Non-Medical): No  Physical Activity: Insufficiently Active (08/06/2023)   Exercise Vital Sign    Days of Exercise per Week: 3 days    Minutes of Exercise per Session: 40 min  Stress: No Stress Concern Present (08/06/2023)   [employer] of Occupational Health - Occupational Stress Questionnaire    Feeling of Stress: Not at all  Social Connections: Socially Integrated (08/06/2023)   Social Connection and Isolation Panel    Frequency of Communication with Friends and Family: More than three times a week    Frequency of Social Gatherings with Friends and Family: More than three times a week    Attends Religious Services: More than 4 times per year    Active Member of Clubs or Organizations: Yes    Attends Banker Meetings: More than 4 times per year    Marital Status: Married  Catering Manager Violence: Not At Risk (06/07/2023)   Humiliation, Afraid, Rape, and Kick questionnaire    Fear of Current or Ex-Partner: No    Emotionally Abused: No    Physically Abused: No    Sexually Abused: No  Depression (PHQ2-9): Low Risk (10/12/2023)   Depression (PHQ2-9)    PHQ-2 Score: 0  Alcohol Screen: Low Risk (06/07/2023)   Alcohol Screen    Last Alcohol Screening Score (AUDIT): 0  Housing: Low Risk (08/06/2023)   Epic    Unable to Pay for Housing in the Last Year: No    Number of Times Moved in the Last Year: 0    Homeless in the Last Year: No  Utilities: Not At Risk (06/07/2023)   AHC Utilities    Threatened with loss of utilities: No  Health Literacy: Adequate  Health Literacy (06/07/2023)   B1300 Health Literacy    Frequency of need for help with medical instructions: Never        Objective:   Vitals:   02/26/24 0854  BP: 138/68  Pulse: (!) 52  Temp: 98 F (36.7 C)  SpO2: 100%  Weight: 191 lb 3.2 oz (86.7 kg)  Height: 5'  2 (1.575 m)   100% on RA BMI Readings from Last 3 Encounters:  02/26/24 34.97 kg/m  01/23/24 24.58 kg/m  01/15/24 24.29 kg/m   Wt Readings from Last 3 Encounters:  02/26/24 191 lb 3.2 oz (86.7 kg)  01/23/24 134 lb 6.4 oz (61 kg)  01/15/24 132 lb 12.8 oz (60.2 kg)    Physical Exam GEN: NAD HEENT: Supple Neck, Reactive Pupils, EOMI  CVS: Normal S1, Normal S2, RRR, No murmurs or ES appreciated  Lungs: Clear bilateral air entry.  Abdomen: Soft, non tender, non distended, + BS  Extremities: Warm and well perfused, No edema   Labs and imaging were reviewed.   Ancillary Information   CBC    Component Value Date/Time   WBC 5.7 01/11/2024 1010   RBC 4.52 01/11/2024 1010   HGB 12.3 01/11/2024 1010   HGB 12.3 09/28/2022 1156   HCT 38.6 01/11/2024 1010   PLT 113.0 (L) 01/11/2024 1010   PLT 113 (L) 09/28/2022 1156   MCV 85.3 01/11/2024 1010   MCH 27.6 09/28/2022 1156   MCHC 32.0 01/11/2024 1010   RDW 17.3 (H) 01/11/2024 1010   LYMPHSABS 3.2 01/11/2024 1010   MONOABS 0.3 01/11/2024 1010   EOSABS 0.1 01/11/2024 1010   BASOSABS 0.0 01/11/2024 1010       Latest Ref Rng & Units 11/02/2023    3:33 PM  PFT Results  FVC-Pre L 2.51   FVC-Predicted Pre % 100   FVC-Post L 2.58   FVC-Predicted Post % 103   Pre FEV1/FVC % % 75   Post FEV1/FCV % % 70   FEV1-Pre L 1.89   FEV1-Predicted Pre % 101   FEV1-Post L 1.82   DLCO uncorrected ml/min/mmHg 15.30   DLCO UNC% % 87   DLVA Predicted % 86   TLC L 5.17   TLC % Predicted % 108   RV % Predicted % 117      Assessment & Plan:  Ms. Toppins is a pleasant 79 year old female patient with a past medical history of tobacco use, hypothyroidism,  hypertension, GERD and hyperlipidemia presenting today to the pulmonary clinic for further evaluation of pulmonary nodule.  #RLL solid subpleural nodule  This is growing in size now measuring 10mm from 7.70mm. 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 PET scan with very mild FDG avidity.  She saw Dr. Shyrl who recommended a Nav bronch with marking followed by resection. However given that the patient continues to smoke this was held. Repeat CT chest super D on 02/2024 [6 months interval] which showed a stable RLL supleural solid nodule and a stable ground glass nodule. Given lack of PET activity, stable nodules we discussed and agreed on continuing radiological monitoring which she is agreeable with.  []  CT chest wo contrast in 6 months.   #RLL GGO superior to the solid nodule  This has been stable for the last 2 years. Continue to monitor to prove stability for 5 years.   Smoking/Tobacco Cessation Counseling PETRONELLA SHUFORD is a current user of tobacco or nicotine products. She is considering quitting at this time. Counseling provided today addressed the risks of continued use and the benefits of cessation. Discussed tobacco/nicotine use history, readiness to quit, and evidence-based treatment options including behavioral strategies, support resources, and pharmacologic therapies. Provided encouragement and educational materials on steps and resources to quit smoking. Patient questions were addressed, and follow-up recommended for continued support. Total time spent on counseling: 4 minutes.  RTC 4 weeks.   I personally spent a total of 40 minutes in the care of the patient today including preparing to see the patient, getting/reviewing separately obtained history, performing a medically appropriate exam/evaluation, counseling and educating, placing orders, documenting clinical information in the EHR, independently interpreting results, and communicating  results.   Darrin Barn, MD [facility] 02/26/2024 9:31 AM   "

## 2024-03-12 ENCOUNTER — Encounter: Payer: Self-pay | Admitting: Cardiovascular Disease

## 2024-03-12 ENCOUNTER — Encounter: Payer: Self-pay | Admitting: Internal Medicine

## 2024-03-12 ENCOUNTER — Ambulatory Visit: Admitting: Cardiovascular Disease

## 2024-03-12 VITALS — BP 144/54 | HR 54 | Ht 62.0 in | Wt 135.2 lb

## 2024-03-12 DIAGNOSIS — Z720 Tobacco use: Secondary | ICD-10-CM

## 2024-03-12 DIAGNOSIS — I2510 Atherosclerotic heart disease of native coronary artery without angina pectoris: Secondary | ICD-10-CM | POA: Diagnosis not present

## 2024-03-12 DIAGNOSIS — I10 Essential (primary) hypertension: Secondary | ICD-10-CM

## 2024-03-12 DIAGNOSIS — E785 Hyperlipidemia, unspecified: Secondary | ICD-10-CM

## 2024-03-12 DIAGNOSIS — R0989 Other specified symptoms and signs involving the circulatory and respiratory systems: Secondary | ICD-10-CM | POA: Insufficient documentation

## 2024-03-12 MED ORDER — ATORVASTATIN CALCIUM 40 MG PO TABS: 40.0000 mg | ORAL_TABLET | Freq: Every day | ORAL | 3 refills | Status: AC

## 2024-03-12 MED ORDER — LISINOPRIL 10 MG PO TABS: 10.0000 mg | ORAL_TABLET | Freq: Every day | ORAL | 3 refills | Status: AC

## 2024-04-15 ENCOUNTER — Other Ambulatory Visit

## 2024-04-18 ENCOUNTER — Ambulatory Visit: Admitting: Internal Medicine

## 2024-06-10 ENCOUNTER — Ambulatory Visit
